# Patient Record
Sex: Male | Born: 1940 | Race: White | Hispanic: No | Marital: Single | Smoking: Never smoker
Health system: Southern US, Community
[De-identification: ages and names within clinical notes are randomized; demographics above are authoritative.]

## PROBLEM LIST (undated history)

## (undated) DIAGNOSIS — J449 Chronic obstructive pulmonary disease, unspecified: Secondary | ICD-10-CM

## (undated) DIAGNOSIS — R0902 Hypoxemia: Secondary | ICD-10-CM

## (undated) DIAGNOSIS — M1990 Unspecified osteoarthritis, unspecified site: Secondary | ICD-10-CM

## (undated) DIAGNOSIS — I10 Essential (primary) hypertension: Secondary | ICD-10-CM

## (undated) DIAGNOSIS — J189 Pneumonia, unspecified organism: Secondary | ICD-10-CM

## (undated) DIAGNOSIS — I2510 Atherosclerotic heart disease of native coronary artery without angina pectoris: Secondary | ICD-10-CM

## (undated) DIAGNOSIS — Z8719 Personal history of other diseases of the digestive system: Secondary | ICD-10-CM

## (undated) DIAGNOSIS — C801 Malignant (primary) neoplasm, unspecified: Secondary | ICD-10-CM

## (undated) DIAGNOSIS — F32A Depression, unspecified: Secondary | ICD-10-CM

## (undated) DIAGNOSIS — R0600 Dyspnea, unspecified: Secondary | ICD-10-CM

## (undated) DIAGNOSIS — E039 Hypothyroidism, unspecified: Secondary | ICD-10-CM

## (undated) DIAGNOSIS — Z87898 Personal history of other specified conditions: Secondary | ICD-10-CM

## (undated) DIAGNOSIS — H9190 Unspecified hearing loss, unspecified ear: Secondary | ICD-10-CM

## (undated) DIAGNOSIS — F329 Major depressive disorder, single episode, unspecified: Secondary | ICD-10-CM

## (undated) DIAGNOSIS — N289 Disorder of kidney and ureter, unspecified: Secondary | ICD-10-CM

## (undated) DIAGNOSIS — E079 Disorder of thyroid, unspecified: Secondary | ICD-10-CM

## (undated) DIAGNOSIS — T7840XA Allergy, unspecified, initial encounter: Secondary | ICD-10-CM

## (undated) DIAGNOSIS — K219 Gastro-esophageal reflux disease without esophagitis: Secondary | ICD-10-CM

## (undated) DIAGNOSIS — R011 Cardiac murmur, unspecified: Secondary | ICD-10-CM

## (undated) DIAGNOSIS — Z87442 Personal history of urinary calculi: Secondary | ICD-10-CM

## (undated) DIAGNOSIS — M109 Gout, unspecified: Secondary | ICD-10-CM

## (undated) HISTORY — PX: HERNIA REPAIR: SHX51

## (undated) HISTORY — PX: UPPER GI ENDOSCOPY: SHX6162

## (undated) HISTORY — PX: CARDIAC CATHETERIZATION: SHX172

## (undated) HISTORY — PX: FRACTURE SURGERY: SHX138

## (undated) HISTORY — DX: Allergy, unspecified, initial encounter: T78.40XA

---
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
## 2003-09-29 ENCOUNTER — Other Ambulatory Visit: Payer: Self-pay

## 2003-09-30 ENCOUNTER — Other Ambulatory Visit: Payer: Self-pay

## 2004-03-13 ENCOUNTER — Emergency Department: Payer: Self-pay | Admitting: Internal Medicine

## 2004-04-16 ENCOUNTER — Emergency Department: Payer: Self-pay | Admitting: Emergency Medicine

## 2004-06-17 ENCOUNTER — Emergency Department: Payer: Self-pay | Admitting: Emergency Medicine

## 2004-07-05 ENCOUNTER — Emergency Department: Payer: Self-pay | Admitting: Unknown Physician Specialty

## 2004-07-05 ENCOUNTER — Other Ambulatory Visit: Payer: Self-pay

## 2004-07-07 ENCOUNTER — Emergency Department: Payer: Self-pay | Admitting: Emergency Medicine

## 2004-08-21 ENCOUNTER — Emergency Department: Payer: Self-pay | Admitting: General Practice

## 2004-10-10 ENCOUNTER — Emergency Department: Payer: Self-pay | Admitting: Unknown Physician Specialty

## 2004-11-04 ENCOUNTER — Emergency Department: Payer: Self-pay | Admitting: Emergency Medicine

## 2005-09-19 ENCOUNTER — Emergency Department: Payer: Self-pay | Admitting: Emergency Medicine

## 2005-10-05 ENCOUNTER — Ambulatory Visit: Payer: Self-pay | Admitting: Internal Medicine

## 2005-11-22 ENCOUNTER — Emergency Department: Payer: Self-pay | Admitting: Emergency Medicine

## 2005-11-22 ENCOUNTER — Other Ambulatory Visit: Payer: Self-pay

## 2005-12-05 ENCOUNTER — Ambulatory Visit: Payer: Self-pay | Admitting: Internal Medicine

## 2005-12-08 ENCOUNTER — Emergency Department: Payer: Self-pay | Admitting: Emergency Medicine

## 2005-12-29 ENCOUNTER — Emergency Department: Payer: Self-pay

## 2006-01-01 ENCOUNTER — Emergency Department: Payer: Self-pay | Admitting: Internal Medicine

## 2006-04-07 ENCOUNTER — Emergency Department: Payer: Self-pay | Admitting: General Practice

## 2006-04-24 ENCOUNTER — Ambulatory Visit: Payer: Self-pay | Admitting: Family Medicine

## 2006-06-05 ENCOUNTER — Other Ambulatory Visit: Payer: Self-pay

## 2006-06-05 ENCOUNTER — Emergency Department: Payer: Self-pay | Admitting: Emergency Medicine

## 2006-06-15 ENCOUNTER — Emergency Department: Payer: Self-pay | Admitting: Emergency Medicine

## 2006-08-07 ENCOUNTER — Emergency Department: Payer: Self-pay

## 2006-08-08 ENCOUNTER — Other Ambulatory Visit: Payer: Self-pay

## 2006-10-23 ENCOUNTER — Emergency Department: Payer: Self-pay | Admitting: Emergency Medicine

## 2006-10-23 ENCOUNTER — Other Ambulatory Visit: Payer: Self-pay

## 2007-04-23 ENCOUNTER — Emergency Department: Payer: Self-pay | Admitting: Emergency Medicine

## 2007-05-18 ENCOUNTER — Emergency Department: Payer: Self-pay | Admitting: Emergency Medicine

## 2007-05-22 ENCOUNTER — Emergency Department: Payer: Self-pay | Admitting: Emergency Medicine

## 2007-05-28 ENCOUNTER — Emergency Department: Payer: Self-pay | Admitting: Unknown Physician Specialty

## 2007-08-09 ENCOUNTER — Emergency Department: Payer: Self-pay | Admitting: Unknown Physician Specialty

## 2008-01-20 ENCOUNTER — Ambulatory Visit: Payer: Self-pay | Admitting: Internal Medicine

## 2008-04-12 ENCOUNTER — Emergency Department: Payer: Self-pay | Admitting: Internal Medicine

## 2009-04-07 ENCOUNTER — Ambulatory Visit: Payer: Self-pay | Admitting: Internal Medicine

## 2009-07-06 ENCOUNTER — Ambulatory Visit: Payer: Self-pay | Admitting: Gastroenterology

## 2009-09-25 ENCOUNTER — Emergency Department: Payer: Self-pay | Admitting: Unknown Physician Specialty

## 2009-10-08 ENCOUNTER — Emergency Department: Payer: Self-pay | Admitting: Emergency Medicine

## 2009-10-11 ENCOUNTER — Emergency Department: Payer: Self-pay | Admitting: Emergency Medicine

## 2010-04-26 ENCOUNTER — Emergency Department: Payer: Self-pay | Admitting: Emergency Medicine

## 2010-05-02 ENCOUNTER — Emergency Department: Payer: Self-pay | Admitting: Emergency Medicine

## 2011-05-16 ENCOUNTER — Emergency Department: Payer: Self-pay

## 2011-11-21 ENCOUNTER — Emergency Department: Payer: Self-pay | Admitting: Emergency Medicine

## 2011-11-21 LAB — COMPREHENSIVE METABOLIC PANEL
Anion Gap: 6 — ABNORMAL LOW (ref 7–16)
Bilirubin,Total: 0.7 mg/dL (ref 0.2–1.0)
Calcium, Total: 9.1 mg/dL (ref 8.5–10.1)
Chloride: 104 mmol/L (ref 98–107)
Creatinine: 1.17 mg/dL (ref 0.60–1.30)
EGFR (African American): >60
EGFR (Non-African Amer.): >60
Glucose: 77 mg/dL (ref 65–99)
Osmolality: 277 (ref 275–301)
SGPT (ALT): 29 U/L (ref 12–78)
Sodium: 139 mmol/L (ref 136–145)

## 2011-11-21 LAB — CBC
HCT: 45.6 % (ref 40.0–52.0)
HGB: 15.8 g/dL (ref 13.0–18.0)
MCHC: 34.5 g/dL (ref 32.0–36.0)
MCV: 96 fL (ref 80–100)
RBC: 4.75 10*6/uL (ref 4.40–5.90)
WBC: 6.1 10*3/uL (ref 3.8–10.6)

## 2012-03-22 ENCOUNTER — Emergency Department: Payer: Self-pay | Admitting: Emergency Medicine

## 2012-03-22 LAB — COMPREHENSIVE METABOLIC PANEL
Alkaline Phosphatase: 75 U/L (ref 50–136)
BUN: 19 mg/dL — ABNORMAL HIGH (ref 7–18)
Chloride: 109 mmol/L — ABNORMAL HIGH (ref 98–107)
Co2: 27 mmol/L (ref 21–32)
Creatinine: 1.18 mg/dL (ref 0.60–1.30)
Glucose: 120 mg/dL — ABNORMAL HIGH (ref 65–99)
Osmolality: 287 (ref 275–301)
SGOT(AST): 22 U/L (ref 15–37)
Sodium: 142 mmol/L (ref 136–145)

## 2012-03-22 LAB — CBC
HGB: 15 g/dL (ref 13.0–18.0)
MCH: 32.2 pg (ref 26.0–34.0)
Platelet: 192 10*3/uL (ref 150–440)
RBC: 4.65 10*6/uL (ref 4.40–5.90)
RDW: 13.1 % (ref 11.5–14.5)

## 2012-03-22 LAB — CK TOTAL AND CKMB (NOT AT ARMC)
CK, Total: 47 U/L (ref 35–232)
CK-MB: <0.5 ng/mL — ABNORMAL LOW (ref 0.5–3.6)

## 2012-03-25 ENCOUNTER — Emergency Department: Payer: Self-pay | Admitting: Urology

## 2012-07-21 DIAGNOSIS — M545 Low back pain, unspecified: Secondary | ICD-10-CM | POA: Insufficient documentation

## 2012-07-23 ENCOUNTER — Emergency Department: Payer: Self-pay

## 2012-09-20 ENCOUNTER — Emergency Department: Payer: Self-pay | Admitting: Emergency Medicine

## 2012-09-20 LAB — BASIC METABOLIC PANEL
BUN: 30 mg/dL — ABNORMAL HIGH (ref 7–18)
Chloride: 104 mmol/L (ref 98–107)
Co2: 32 mmol/L (ref 21–32)
Creatinine: 1.86 mg/dL — ABNORMAL HIGH (ref 0.60–1.30)
EGFR (African American): 42 — ABNORMAL LOW
Glucose: 92 mg/dL (ref 65–99)
Osmolality: 283 (ref 275–301)
Potassium: 4.1 mmol/L (ref 3.5–5.1)

## 2012-09-20 LAB — CBC
HCT: 38.7 % — ABNORMAL LOW (ref 40.0–52.0)
MCHC: 35.8 g/dL (ref 32.0–36.0)
MCV: 95 fL (ref 80–100)
Platelet: 175 10*3/uL (ref 150–440)
RDW: 13.9 % (ref 11.5–14.5)

## 2013-01-29 ENCOUNTER — Emergency Department: Payer: Self-pay | Admitting: Emergency Medicine

## 2013-01-29 LAB — URINALYSIS, COMPLETE
Bacteria: NONE SEEN
Bilirubin,UR: NEGATIVE
Blood: NEGATIVE
Glucose,UR: NEGATIVE mg/dL (ref 0–75)
Leukocyte Esterase: NEGATIVE
RBC,UR: NONE SEEN /HPF (ref 0–5)
Specific Gravity: 1.016 (ref 1.003–1.030)
WBC UR: 1 /HPF (ref 0–5)

## 2013-01-29 LAB — COMPREHENSIVE METABOLIC PANEL
Albumin: 4 g/dL (ref 3.4–5.0)
Alkaline Phosphatase: 77 U/L
Anion Gap: 4 — ABNORMAL LOW (ref 7–16)
Bilirubin,Total: 0.5 mg/dL (ref 0.2–1.0)
Calcium, Total: 9.7 mg/dL (ref 8.5–10.1)
Co2: 32 mmol/L (ref 21–32)
Creatinine: 1.65 mg/dL — ABNORMAL HIGH (ref 0.60–1.30)
EGFR (African American): 48 — ABNORMAL LOW
Glucose: 86 mg/dL (ref 65–99)
Osmolality: 288 (ref 275–301)
Potassium: 4.1 mmol/L (ref 3.5–5.1)
SGOT(AST): 20 U/L (ref 15–37)
SGPT (ALT): 30 U/L (ref 12–78)
Sodium: 142 mmol/L (ref 136–145)
Total Protein: 7.5 g/dL (ref 6.4–8.2)

## 2013-01-29 LAB — CBC WITH DIFFERENTIAL/PLATELET
Basophil %: 0.9 %
Eosinophil %: 1.3 %
HGB: 14.8 g/dL (ref 13.0–18.0)
MCH: 32.5 pg (ref 26.0–34.0)
MCHC: 33.7 g/dL (ref 32.0–36.0)
MCV: 96 fL (ref 80–100)
Monocyte %: 6.6 %
Neutrophil #: 5.3 10*3/uL (ref 1.4–6.5)
Neutrophil %: 66.1 %
WBC: 8 10*3/uL (ref 3.8–10.6)

## 2013-02-23 ENCOUNTER — Emergency Department: Payer: Self-pay | Admitting: Emergency Medicine

## 2013-06-03 ENCOUNTER — Emergency Department (HOSPITAL_COMMUNITY)
Admission: EM | Admit: 2013-06-03 | Discharge: 2013-06-03 | Disposition: A | Discharge status: Home / Self Care | Payer: Medicare HMO | Attending: Emergency Medicine | Admitting: Emergency Medicine

## 2013-06-03 ENCOUNTER — Encounter (HOSPITAL_COMMUNITY): Payer: Self-pay | Admitting: Emergency Medicine

## 2013-06-03 DIAGNOSIS — E079 Disorder of thyroid, unspecified: Secondary | ICD-10-CM | POA: Insufficient documentation

## 2013-06-03 DIAGNOSIS — Z79899 Other long term (current) drug therapy: Secondary | ICD-10-CM | POA: Insufficient documentation

## 2013-06-03 DIAGNOSIS — I10 Essential (primary) hypertension: Secondary | ICD-10-CM | POA: Insufficient documentation

## 2013-06-03 DIAGNOSIS — I2510 Atherosclerotic heart disease of native coronary artery without angina pectoris: Secondary | ICD-10-CM | POA: Insufficient documentation

## 2013-06-03 DIAGNOSIS — K5900 Constipation, unspecified: Secondary | ICD-10-CM | POA: Insufficient documentation

## 2013-06-03 DIAGNOSIS — M109 Gout, unspecified: Secondary | ICD-10-CM | POA: Insufficient documentation

## 2013-06-03 DIAGNOSIS — Z87448 Personal history of other diseases of urinary system: Secondary | ICD-10-CM | POA: Insufficient documentation

## 2013-06-03 HISTORY — DX: Essential (primary) hypertension: I10

## 2013-06-03 HISTORY — DX: Atherosclerotic heart disease of native coronary artery without angina pectoris: I25.10

## 2013-06-03 HISTORY — DX: Disorder of thyroid, unspecified: E07.9

## 2013-06-03 HISTORY — DX: Gout, unspecified: M10.9

## 2013-06-03 HISTORY — DX: Disorder of kidney and ureter, unspecified: N28.9

## 2013-06-03 LAB — URINALYSIS, ROUTINE W REFLEX MICROSCOPIC
BILIRUBIN URINE: NEGATIVE
Glucose, UA: NEGATIVE mg/dL
HGB URINE DIPSTICK: NEGATIVE
Ketones, ur: NEGATIVE mg/dL
Leukocytes, UA: NEGATIVE
NITRITE: NEGATIVE
Protein, ur: NEGATIVE mg/dL
SPECIFIC GRAVITY, URINE: 1.012 (ref 1.005–1.030)
Urobilinogen, UA: 0.2 mg/dL (ref 0.0–1.0)
pH: 7 (ref 5.0–8.0)

## 2013-06-03 LAB — COMPREHENSIVE METABOLIC PANEL
ALBUMIN: 4.2 g/dL (ref 3.5–5.2)
ALT: 23 U/L (ref 0–53)
AST: 26 U/L (ref 0–37)
Alkaline Phosphatase: 54 U/L (ref 39–117)
BUN: 20 mg/dL (ref 6–23)
CO2: 25 mEq/L (ref 19–32)
CREATININE: 1.45 mg/dL — AB (ref 0.50–1.35)
Calcium: 9.8 mg/dL (ref 8.4–10.5)
Chloride: 102 mEq/L (ref 96–112)
GFR calc non Af Amer: 47 mL/min — ABNORMAL LOW (ref >90)
GFR, EST AFRICAN AMERICAN: 54 mL/min — AB (ref >90)
GLUCOSE: 79 mg/dL (ref 70–99)
Potassium: 4.4 mEq/L (ref 3.7–5.3)
Sodium: 141 mEq/L (ref 137–147)
TOTAL PROTEIN: 7.3 g/dL (ref 6.0–8.3)
Total Bilirubin: 0.7 mg/dL (ref 0.3–1.2)

## 2013-06-03 LAB — CBC WITH DIFFERENTIAL/PLATELET
BASOS PCT: 0 % (ref 0–1)
Basophils Absolute: 0 10*3/uL (ref 0.0–0.1)
EOS PCT: 2 % (ref 0–5)
Eosinophils Absolute: 0.1 10*3/uL (ref 0.0–0.7)
HCT: 44.2 % (ref 39.0–52.0)
HEMOGLOBIN: 15 g/dL (ref 13.0–17.0)
LYMPHS ABS: 2.1 10*3/uL (ref 0.7–4.0)
Lymphocytes Relative: 36 % (ref 12–46)
MCH: 32.4 pg (ref 26.0–34.0)
MCHC: 33.9 g/dL (ref 30.0–36.0)
MCV: 95.5 fL (ref 78.0–100.0)
MONOS PCT: 11 % (ref 3–12)
Monocytes Absolute: 0.6 10*3/uL (ref 0.1–1.0)
Neutro Abs: 2.9 10*3/uL (ref 1.7–7.7)
Neutrophils Relative %: 51 % (ref 43–77)
Platelets: 172 10*3/uL (ref 150–400)
RBC: 4.63 MIL/uL (ref 4.22–5.81)
RDW: 13.4 % (ref 11.5–15.5)
WBC: 5.8 10*3/uL (ref 4.0–10.5)

## 2013-06-03 LAB — LIPASE, BLOOD: Lipase: 18 U/L (ref 11–59)

## 2013-06-03 LAB — TROPONIN I

## 2013-06-03 MED ORDER — POLYETHYLENE GLYCOL 3350 17 GM/SCOOP PO POWD: 17.0000 g | Freq: Every day | ORAL | Status: DC

## 2013-06-03 MED ORDER — DOCUSATE SODIUM 100 MG PO CAPS: 100.0000 mg | ORAL_CAPSULE | Freq: Two times a day (BID) | ORAL | Status: DC

## 2013-06-03 NOTE — ED Notes (Signed)
The pt has been constipated for 6 months .  He was at his doctors office today and ems was called for the pts bradycardia.  ems reports that the pt hr was in the 50s sinus brady and the doctor has been attempting to get his bp under control.  Everything makes his heart decrease.  The pt is upset on arrival that he had to leave his truck back at the office

## 2013-06-03 NOTE — ED Notes (Signed)
The pts last bm was at 0200am today.  hehas pain in his shoulders and neck .  He has arthritis

## 2013-06-03 NOTE — Discharge Instructions (Signed)
Constipation, Adult Constipation is when a person has fewer than 3 bowel movements a week; has difficulty having a bowel movement; or has stools that are dry, hard, or larger than normal. As people grow older, constipation is more common. If you try to fix constipation with medicines that make you have a bowel movement (laxatives), the problem may get worse. Long-term laxative use may cause the muscles of the colon to become weak. A low-fiber diet, not taking in enough fluids, and taking certain medicines may make constipation worse. CAUSES   Certain medicines, such as antidepressants, pain medicine, iron supplements, antacids, and water pills.   Certain diseases, such as diabetes, irritable bowel syndrome (IBS), thyroid disease, or depression.   Not drinking enough water.   Not eating enough fiber-rich foods.   Stress or travel.  Lack of physical activity or exercise.  Not going to the restroom when there is the urge to have a bowel movement.  Ignoring the urge to have a bowel movement.  Using laxatives too much. SYMPTOMS   Having fewer than 3 bowel movements a week.   Straining to have a bowel movement.   Having hard, dry, or larger than normal stools.   Feeling full or bloated.   Pain in the lower abdomen.  Not feeling relief after having a bowel movement. DIAGNOSIS  Your caregiver will take a medical history and perform a physical exam. Further testing may be done for severe constipation. Some tests may include:   A barium enema X-ray to examine your rectum, colon, and sometimes, your small intestine.  A sigmoidoscopy to examine your lower colon.  A colonoscopy to examine your entire colon. TREATMENT  Treatment will depend on the severity of your constipation and what is causing it. Some dietary treatments include drinking more fluids and eating more fiber-rich foods. Lifestyle treatments may include regular exercise. If these diet and lifestyle recommendations  do not help, your caregiver may recommend taking over-the-counter laxative medicines to help you have bowel movements. Prescription medicines may be prescribed if over-the-counter medicines do not work.  HOME CARE INSTRUCTIONS   Increase dietary fiber in your diet, such as fruits, vegetables, whole grains, and beans. Limit high-fat and processed sugars in your diet, such as [place] fries, hamburgers, cookies, candies, and soda.   A fiber supplement may be added to your diet if you cannot get enough fiber from foods.   Drink enough fluids to keep your urine clear or pale yellow.   Exercise regularly or as directed by your caregiver.   Go to the restroom when you have the urge to go. Do not hold it.  Only take medicines as directed by your caregiver. Do not take other medicines for constipation without talking to your caregiver first. [place] IF:   You have bright red blood in your stool.   Your constipation lasts for more than 4 days or gets worse.   You have abdominal or rectal pain.   You have thin, pencil-like stools.  You have unexplained weight loss. MAKE SURE YOU:   Understand these instructions.  Will watch your condition.  Will get help right away if you are not doing well or get worse. Document Released: 10/28/2003 Document Revised: 04/23/2011 Document Reviewed: 11/10/2012 [facility] Patient Information 2014 [place], [place]. Hypertension As your heart beats, it forces blood through your arteries. This force is your blood pressure. If the pressure is too high, it is called hypertension (HTN) or high blood pressure. HTN is dangerous because  you may have it and not know it. High blood pressure may mean that your heart has to work harder to pump blood. Your arteries may be narrow or stiff. The extra work puts you at risk for heart disease, stroke, and other problems.  Blood pressure consists of two numbers, a higher number over a lower, 110/72, for  example. It is stated as "110 over 72." The ideal is below 120 for the top number (systolic) and under 80 for the bottom (diastolic). Write down your blood pressure today. You should pay close attention to your blood pressure if you have certain conditions such as:  Heart failure.  Prior heart attack.  Diabetes  Chronic kidney disease.  Prior stroke.  Multiple risk factors for heart disease. To see if you have HTN, your blood pressure should be measured while you are seated with your arm held at the level of the heart. It should be measured at least twice. A one-time elevated blood pressure reading (especially in the Emergency Department) does not mean that you need treatment. There may be conditions in which the blood pressure is different between your right and left arms. It is important to see your caregiver soon for a recheck. Most people have essential hypertension which means that there is not a specific cause. This type of high blood pressure may be lowered by changing lifestyle factors such as:  Stress.  Smoking.  Lack of exercise.  Excessive weight.  Drug/tobacco/alcohol use.  Eating less salt. Most people do not have symptoms from high blood pressure until it has caused damage to the body. Effective treatment can often prevent, delay or reduce that damage. TREATMENT  When a cause has been identified, treatment for high blood pressure is directed at the cause. There are a large number of medications to treat HTN. These fall into several categories, and your caregiver will help you select the medicines that are best for you. Medications may have side effects. You should review side effects with your caregiver. If your blood pressure stays high after you have made lifestyle changes or started on medicines,   Your medication(s) may need to be changed.  Other problems may need to be addressed.  Be certain you understand your prescriptions, and know how and when to take your  medicine.  Be sure to follow up with your caregiver within the time frame advised (usually within two weeks) to have your blood pressure rechecked and to review your medications.  If you are taking more than one medicine to lower your blood pressure, make sure you know how and at what times they should be taken. Taking two medicines at the same time can result in blood pressure that is too low. SEEK IMMEDIATE MEDICAL CARE IF:  You develop a severe headache, blurred or changing vision, or confusion.  You have unusual weakness or numbness, or a faint feeling.  You have severe chest or abdominal pain, vomiting, or breathing problems. MAKE SURE YOU:   Understand these instructions.  Will watch your condition.  Will get help right away if you are not doing well or get worse. Document Released: 01/29/2005 Document Revised: 04/23/2011 Document Reviewed: 09/19/2007 [facility] Patient Information 2014 [place].

## 2013-06-03 NOTE — ED Notes (Signed)
The pt is very hard of hearing.  Talking on the phone to his family.  They are coming by to get the keps for his truck

## 2013-06-03 NOTE — ED Provider Notes (Signed)
CSN: [id number]     Arrival date & time 06/03/13  1637 History   First MD Initiated Contact with Patient 06/03/13 1730     Chief Complaint  Patient presents with  . Hypertension     (Consider location/radiation/quality/duration/timing/severity/associated sxs/prior Treatment) Patient is a 73 y.o. male presenting with hypertension.  Hypertension   Pt with history of difficult to control HTN also has had constipation off and on for several months he relates to some of the medications he is taking. States he went to his PCP today for evaluation of the constipation and was found to have elevated BP and low HR. He was advised to come to the ED for evaluation. Denies any CP, SOB, leg swelling. States he had a hard stool around 2am last night. May have had some rust colored stool at that time.   Past Medical History  Diagnosis Date  . Hypertension   . Gout   . Thyroid disease   . Coronary artery disease   . Renal disorder    History reviewed. No pertinent past surgical history. No family history on file. History  Substance Use Topics  . Smoking status: Never Smoker   . Smokeless tobacco: Not on file  . Alcohol Use: No    Review of Systems  All other systems reviewed and are negative except as noted in HPI.    Allergies  Codeine; Morphine and related; Novocain; Percocet; and Sulfa antibiotics  Home Medications   Prior to Admission medications   Medication Sig Start Date End Date Taking? Authorizing Provider  allopurinol (ZYLOPRIM) 300 MG tablet Take 300 mg by mouth daily.   Yes Historical Provider, MD  Cholecalciferol (VITAMIN D3) 5000 UNITS TABS Take 5,000 Units by mouth daily.   Yes Historical Provider, MD  cloNIDine (CATAPRES) 0.2 MG tablet Take 0.1 mg by mouth 2 (two) times daily.   Yes Historical Provider, MD  ezetimibe (ZETIA) 10 MG tablet Take 10 mg by mouth daily.   Yes Historical Provider, MD  levothyroxine (SYNTHROID, LEVOTHROID) 112 MCG tablet Take 112 mcg by mouth  daily before breakfast.   Yes Historical Provider, MD  losartan (COZAAR) 100 MG tablet Take 100 mg by mouth daily.   Yes Historical Provider, MD  metoprolol (LOPRESSOR) 100 MG tablet Take 100 mg by mouth daily.   Yes Historical Provider, MD  pantoprazole (PROTONIX) 40 MG tablet Take 40 mg by mouth daily.   Yes Historical Provider, MD   BP 193/82  Pulse 53  Temp(Src) 97.6 F (36.4 C) (Oral)  Resp 16  SpO2 97% Physical Exam  Nursing note and vitals reviewed. Constitutional: He is oriented to person, place, and time. He appears well-developed and well-nourished.  HENT:  Head: Normocephalic and atraumatic.  Eyes: EOM are normal. Pupils are equal, round, and reactive to light.  Neck: Normal range of motion. Neck supple.  Cardiovascular: Normal rate, normal heart sounds and intact distal pulses.   Pulmonary/Chest: Effort normal and breath sounds normal.  Abdominal: Bowel sounds are normal. He exhibits no distension. There is no tenderness.  Genitourinary: Rectum normal. Guaiac negative stool.  Musculoskeletal: Normal range of motion. He exhibits no edema and no tenderness.  Neurological: He is alert and oriented to person, place, and time. He has normal strength. No cranial nerve deficit or sensory deficit.  Skin: Skin is warm and dry. No rash noted.  Psychiatric: He has a normal mood and affect.    ED Course  Procedures (including critical care time) Labs Review Labs Reviewed  COMPREHENSIVE METABOLIC PANEL - Abnormal; Notable for the following:    Creatinine, Ser 1.45 (*)    GFR calc non Af Amer 47 (*)    GFR calc Af Amer 54 (*)    All other components within normal limits  CBC WITH DIFFERENTIAL  LIPASE, BLOOD  TROPONIN I  URINALYSIS, ROUTINE W REFLEX MICROSCOPIC    Imaging Review No results found.   EKG Interpretation   Date/Time:  Wednesday June 03 2013 16:44:33 EDT Ventricular Rate:  53 PR Interval:  158 QRS Duration: 97 QT Interval:  425 QTC Calculation: 399 R  Axis:   4 Text Interpretation:  Sinus rhythm Probable anteroseptal infarct, recent  Minimal ST elevation, inferior leads Lateral leads are also involved  Baseline wander in lead(s) II III aVF V2 V3 No old tracing to compare  Confirmed by [facility]  MD, Juanda Crumble [phone]) on 06/03/2013 5:32:18 PM      MDM   Final diagnoses:  None    Labs reviewed and unremarkable. Positive hemoccult is a lab entry error. It was negative. No evidence of end organ damage. Pt missed his middle of the day dose of Clonidine today. No indication for admission. Advised to call PCP tomorrow for long term management of his BP. Will give miralax, colace for constipation.    Charles B. Karle Starch, MD 06/03/13 1924

## 2013-06-03 NOTE — ED Notes (Signed)
The pts bp is slowly decreasing.  Pt remains alert no distress

## 2013-06-04 ENCOUNTER — Encounter: Payer: Self-pay | Admitting: Cardiovascular Disease

## 2013-06-04 ENCOUNTER — Other Ambulatory Visit: Payer: Self-pay | Admitting: Family Medicine

## 2013-06-04 ENCOUNTER — Ambulatory Visit
Admission: RE | Admit: 2013-06-04 | Discharge: 2013-06-04 | Disposition: A | Discharge status: Home / Self Care | Payer: Medicare Other | Source: Ambulatory Visit | Attending: Family Medicine | Admitting: Family Medicine

## 2013-06-04 DIAGNOSIS — N289 Disorder of kidney and ureter, unspecified: Secondary | ICD-10-CM | POA: Insufficient documentation

## 2013-06-04 DIAGNOSIS — I129 Hypertensive chronic kidney disease with stage 1 through stage 4 chronic kidney disease, or unspecified chronic kidney disease: Secondary | ICD-10-CM | POA: Insufficient documentation

## 2013-06-04 DIAGNOSIS — K56609 Unspecified intestinal obstruction, unspecified as to partial versus complete obstruction: Secondary | ICD-10-CM

## 2013-06-04 DIAGNOSIS — E079 Disorder of thyroid, unspecified: Secondary | ICD-10-CM | POA: Insufficient documentation

## 2013-06-04 DIAGNOSIS — I2510 Atherosclerotic heart disease of native coronary artery without angina pectoris: Secondary | ICD-10-CM | POA: Insufficient documentation

## 2013-06-04 DIAGNOSIS — I10 Essential (primary) hypertension: Secondary | ICD-10-CM | POA: Insufficient documentation

## 2013-06-04 DIAGNOSIS — M109 Gout, unspecified: Secondary | ICD-10-CM | POA: Insufficient documentation

## 2013-06-04 LAB — POC OCCULT BLOOD, ED: FECAL OCCULT BLD: NEGATIVE

## 2013-06-05 ENCOUNTER — Encounter: Payer: Self-pay | Admitting: Cardiovascular Disease

## 2013-06-05 ENCOUNTER — Ambulatory Visit (INDEPENDENT_AMBULATORY_CARE_PROVIDER_SITE_OTHER): Payer: Commercial Managed Care - HMO | Admitting: Cardiovascular Disease

## 2013-06-05 VITALS — BP 150/78 | HR 60 | Ht 67.0 in | Wt 212.0 lb

## 2013-06-05 DIAGNOSIS — R001 Bradycardia, unspecified: Secondary | ICD-10-CM | POA: Insufficient documentation

## 2013-06-05 DIAGNOSIS — R5381 Other malaise: Secondary | ICD-10-CM

## 2013-06-05 DIAGNOSIS — I517 Cardiomegaly: Secondary | ICD-10-CM

## 2013-06-05 DIAGNOSIS — I498 Other specified cardiac arrhythmias: Secondary | ICD-10-CM

## 2013-06-05 DIAGNOSIS — R5383 Other fatigue: Secondary | ICD-10-CM | POA: Insufficient documentation

## 2013-06-05 DIAGNOSIS — I10 Essential (primary) hypertension: Secondary | ICD-10-CM

## 2013-06-05 MED ORDER — EZETIMIBE 10 MG PO TABS: 10.0000 mg | ORAL_TABLET | Freq: Every day | ORAL | Status: DC

## 2013-06-05 NOTE — Progress Notes (Signed)
Patient ID: Bobby Lane, male   DOB: 1940/08/27, 73 y.o.   MRN: [id number]   73 yo patient of Dr Ernie Hew.  Seen in ER recently for HTN. Had been constipated  Primary changing meds and BP high with bradycardia that was iatrogenic.  Since then beta blocker has been decreased and clonidine increased to tid and improved. BP Rx for over 10 years Cr around 1.5.  Usually compliant with meds but not always due to being on fixed income Denies ETOH.  Has never had syncope or palpitations.  Constipation had improved and he refused KUB.  No history of CAD.  Has baldder issue where it was sewn to abdominal wall during hernia repair and has some frequency.  Fatigue worse with adjustments in BP meds especially clonidine which causes dry mouth    ROS: Denies fever, malais, weight loss, blurry vision, decreased visual acuity, cough, sputum, SOB, hemoptysis, pleuritic pain, palpitaitons, heartburn, abdominal pain, melena, lower extremity edema, claudication, or rash.  All other systems reviewed and negative   General: Affect appropriate Desheveled white male HEENT: poor dentition  Neck supple with no adenopathy JVP normal no bruits no thyromegaly Lungs clear with no wheezing and good diaphragmatic motion Heart:  S1/S2 no murmur,rub, gallop or click PMI normal Abdomen: benighn, BS positve, no tenderness, no AAA no bruit.  No HSM or HJR Distal pulses intact with no bruits No edema Neuro non-focal Skin warm and dry No muscular weakness  Medications Current Outpatient Prescriptions  Medication Sig Dispense Refill  . allopurinol (ZYLOPRIM) 300 MG tablet Take 300 mg by mouth daily.      . Cholecalciferol (VITAMIN D3) 5000 UNITS TABS Take 5,000 Units by mouth daily.      . cloNIDine (CATAPRES) 0.2 MG tablet Take 0.1 mg by mouth 2 (two) times daily.      Marland Kitchen docusate sodium (COLACE) 100 MG capsule Take 1 capsule (100 mg total) by mouth every 12 (twelve) hours.  60 capsule  0  . ezetimibe (ZETIA) 10 MG tablet Take  10 mg by mouth daily.      Marland Kitchen levothyroxine (SYNTHROID, LEVOTHROID) 112 MCG tablet Take 112 mcg by mouth daily before breakfast.      . losartan (COZAAR) 100 MG tablet Take 100 mg by mouth daily.      . metoprolol (LOPRESSOR) 100 MG tablet Take 100 mg by mouth daily. Taking 50mg       . pantoprazole (PROTONIX) 40 MG tablet Take 40 mg by mouth daily.       No current facility-administered medications for this visit.    Allergies Codeine; Morphine and related; Novocain; Percocet; and Sulfa antibiotics  Family History: No family history on file.  Social History: History   Social History  . Marital Status: Widowed    Spouse Name: N/A    Number of Children: N/A  . Years of Education: N/A   Occupational History  . Not on file.   Social History Main Topics  . Smoking status: Never Smoker   . Smokeless tobacco: Not on file  . Alcohol Use: No  . Drug Use: Not on file  . Sexual Activity: Not on file   Other Topics Concern  . Not on file   Social History Narrative  . No narrative on file    Electrocardiogram:  NSR 53 LVH  Assessment and Plan

## 2013-06-05 NOTE — Patient Instructions (Signed)
Your physician recommends that you schedule a follow-up appointment in:  AS NEEDED   Your physician recommends that you continue on your current medications as directed. Please refer to the Current Medication list given to you today. Your physician has requested that you have an echocardiogram. Echocardiography is a painless test that uses sound waves to create images of your heart. It provides your doctor with information about the size and shape of your heart and how well your heart's chambers and valves are working. This procedure takes approximately one hour. There are no restrictions for this procedure.   Your physician has requested that you have a renal artery duplex. During this test, an ultrasound is used to evaluate blood flow to the kidneys. Allow one hour for this exam. Do not eat after midnight the day before and avoid carbonated beverages. Take your medications as you usually do.

## 2013-06-05 NOTE — Assessment & Plan Note (Signed)
Should be handled by primary Will order renal duplex to make sure no RAS given mild elevation in Cr Suspect hydralazine by be a better tolerated drug for him in future.

## 2013-06-05 NOTE — Assessment & Plan Note (Signed)
Iatrogenic  Absolutely no indication for pacer  Avoid excess doses of beta blocker to Rx HTN.  ECG with normal intervals

## 2013-06-05 NOTE — Assessment & Plan Note (Signed)
Doubt cardiac etiology May be from high dose of clonidine  Echo to assess LV function

## 2013-06-23 ENCOUNTER — Ambulatory Visit (HOSPITAL_COMMUNITY): Payer: Medicare HMO | Attending: Cardiology | Admitting: Cardiology

## 2013-06-23 ENCOUNTER — Ambulatory Visit (HOSPITAL_BASED_OUTPATIENT_CLINIC_OR_DEPARTMENT_OTHER): Payer: Medicare HMO | Admitting: Radiology

## 2013-06-23 DIAGNOSIS — I10 Essential (primary) hypertension: Secondary | ICD-10-CM | POA: Insufficient documentation

## 2013-06-23 DIAGNOSIS — I517 Cardiomegaly: Secondary | ICD-10-CM

## 2013-06-23 NOTE — Progress Notes (Signed)
Renal artery duplex completed

## 2013-06-23 NOTE — Progress Notes (Signed)
Echocardiogram performed.  

## 2013-07-11 ENCOUNTER — Emergency Department: Payer: Self-pay | Admitting: Emergency Medicine

## 2013-08-21 ENCOUNTER — Emergency Department: Payer: Self-pay | Admitting: Emergency Medicine

## 2013-09-02 ENCOUNTER — Emergency Department: Payer: Self-pay | Admitting: Emergency Medicine

## 2013-09-02 LAB — URINALYSIS, COMPLETE
Bacteria: NONE SEEN
Bilirubin,UR: NEGATIVE
Blood: NEGATIVE
Glucose,UR: NEGATIVE mg/dL (ref 0–75)
Ketone: NEGATIVE
Leukocyte Esterase: NEGATIVE
Nitrite: NEGATIVE
PH: 5 (ref 4.5–8.0)
PROTEIN: NEGATIVE
Specific Gravity: 1.013 (ref 1.003–1.030)
Squamous Epithelial: <1
WBC UR: NONE SEEN /HPF (ref 0–5)

## 2013-09-02 LAB — COMPREHENSIVE METABOLIC PANEL
ALK PHOS: 61 U/L
ANION GAP: 7 (ref 7–16)
Albumin: 4 g/dL (ref 3.4–5.0)
BUN: 30 mg/dL — AB (ref 7–18)
Bilirubin,Total: 0.4 mg/dL (ref 0.2–1.0)
CHLORIDE: 105 mmol/L (ref 98–107)
CO2: 29 mmol/L (ref 21–32)
Calcium, Total: 9.9 mg/dL (ref 8.5–10.1)
Creatinine: 1.53 mg/dL — ABNORMAL HIGH (ref 0.60–1.30)
EGFR (Non-African Amer.): 45 — ABNORMAL LOW
GFR CALC AF AMER: 52 — AB
Glucose: 89 mg/dL (ref 65–99)
Osmolality: 287 (ref 275–301)
POTASSIUM: 4.5 mmol/L (ref 3.5–5.1)
SGOT(AST): 31 U/L (ref 15–37)
SGPT (ALT): 46 U/L
SODIUM: 141 mmol/L (ref 136–145)
TOTAL PROTEIN: 7.4 g/dL (ref 6.4–8.2)

## 2013-09-02 LAB — CBC WITH DIFFERENTIAL/PLATELET
Basophil #: 0.1 10*3/uL (ref 0.0–0.1)
Basophil %: 0.7 %
EOS ABS: 0.1 10*3/uL (ref 0.0–0.7)
Eosinophil %: 1.7 %
HCT: 45 % (ref 40.0–52.0)
HGB: 15 g/dL (ref 13.0–18.0)
LYMPHS ABS: 2.3 10*3/uL (ref 1.0–3.6)
Lymphocyte %: 31 %
MCH: 33.2 pg (ref 26.0–34.0)
MCHC: 33.3 g/dL (ref 32.0–36.0)
MCV: 100 fL (ref 80–100)
Monocyte #: 0.6 x10 3/mm (ref 0.2–1.0)
Monocyte %: 8.4 %
Neutrophil #: 4.3 10*3/uL (ref 1.4–6.5)
Neutrophil %: 58.2 %
Platelet: 204 10*3/uL (ref 150–440)
RBC: 4.52 10*6/uL (ref 4.40–5.90)
RDW: 13.9 % (ref 11.5–14.5)
WBC: 7.3 10*3/uL (ref 3.8–10.6)

## 2013-10-09 ENCOUNTER — Ambulatory Visit
Admission: RE | Admit: 2013-10-09 | Discharge: 2013-10-09 | Disposition: A | Discharge status: Home / Self Care | Payer: Commercial Managed Care - HMO | Source: Ambulatory Visit | Attending: Family Medicine | Admitting: Family Medicine

## 2013-10-09 ENCOUNTER — Other Ambulatory Visit: Payer: Self-pay | Admitting: Family Medicine

## 2013-10-09 DIAGNOSIS — M25551 Pain in right hip: Secondary | ICD-10-CM

## 2014-04-23 ENCOUNTER — Other Ambulatory Visit: Payer: Self-pay | Admitting: Family Medicine

## 2014-04-23 ENCOUNTER — Ambulatory Visit
Admission: RE | Admit: 2014-04-23 | Discharge: 2014-04-23 | Disposition: A | Discharge status: Home / Self Care | Payer: Commercial Managed Care - HMO | Source: Ambulatory Visit | Attending: Family Medicine | Admitting: Family Medicine

## 2014-04-23 DIAGNOSIS — R0602 Shortness of breath: Secondary | ICD-10-CM

## 2014-05-26 DIAGNOSIS — I10 Essential (primary) hypertension: Secondary | ICD-10-CM | POA: Diagnosis not present

## 2014-05-26 DIAGNOSIS — H6123 Impacted cerumen, bilateral: Secondary | ICD-10-CM | POA: Diagnosis not present

## 2014-05-26 DIAGNOSIS — R252 Cramp and spasm: Secondary | ICD-10-CM | POA: Diagnosis not present

## 2014-05-26 DIAGNOSIS — J449 Chronic obstructive pulmonary disease, unspecified: Secondary | ICD-10-CM | POA: Diagnosis not present

## 2014-06-03 DIAGNOSIS — E039 Hypothyroidism, unspecified: Secondary | ICD-10-CM | POA: Diagnosis not present

## 2014-06-03 DIAGNOSIS — I499 Cardiac arrhythmia, unspecified: Secondary | ICD-10-CM | POA: Diagnosis not present

## 2014-06-03 DIAGNOSIS — I10 Essential (primary) hypertension: Secondary | ICD-10-CM | POA: Diagnosis not present

## 2014-06-03 DIAGNOSIS — J449 Chronic obstructive pulmonary disease, unspecified: Secondary | ICD-10-CM | POA: Diagnosis not present

## 2014-07-27 ENCOUNTER — Encounter: Payer: Self-pay | Admitting: *Deleted

## 2014-08-05 ENCOUNTER — Ambulatory Visit: Payer: Self-pay | Admitting: General Surgery

## 2014-08-05 DIAGNOSIS — Z79899 Other long term (current) drug therapy: Secondary | ICD-10-CM | POA: Insufficient documentation

## 2014-08-05 DIAGNOSIS — A6920 Lyme disease, unspecified: Secondary | ICD-10-CM | POA: Insufficient documentation

## 2014-08-05 DIAGNOSIS — I10 Essential (primary) hypertension: Secondary | ICD-10-CM | POA: Diagnosis not present

## 2014-08-05 DIAGNOSIS — R21 Rash and other nonspecific skin eruption: Secondary | ICD-10-CM | POA: Diagnosis present

## 2014-08-06 ENCOUNTER — Emergency Department
Admission: EM | Admit: 2014-08-06 | Discharge: 2014-08-06 | Disposition: A | Discharge status: Home / Self Care | Payer: Medicare HMO | Attending: Emergency Medicine | Admitting: Emergency Medicine

## 2014-08-06 ENCOUNTER — Encounter: Payer: Self-pay | Admitting: *Deleted

## 2014-08-06 DIAGNOSIS — A6920 Lyme disease, unspecified: Secondary | ICD-10-CM

## 2014-08-06 MED ORDER — PANTOPRAZOLE SODIUM 40 MG PO TBEC
40.0000 mg | DELAYED_RELEASE_TABLET | Freq: Every day | ORAL | Status: DC
  Administered 2014-08-06: 40 mg via ORAL

## 2014-08-06 MED ORDER — DOXYCYCLINE HYCLATE 100 MG PO TABS
100.0000 mg | ORAL_TABLET | Freq: Once | ORAL | Status: AC
  Administered 2014-08-06: 100 mg via ORAL

## 2014-08-06 MED ORDER — DOXYCYCLINE HYCLATE 100 MG PO TABS: 100.0000 mg | ORAL_TABLET | Freq: Two times a day (BID) | ORAL | Status: DC

## 2014-08-06 MED ORDER — PANTOPRAZOLE SODIUM 40 MG PO TBEC
DELAYED_RELEASE_TABLET | ORAL | Status: AC
  Administered 2014-08-06: 40 mg via ORAL
  Filled 2014-08-06: qty 1

## 2014-08-06 MED ORDER — OMEPRAZOLE 40 MG PO CPDR: 40.0000 mg | DELAYED_RELEASE_CAPSULE | Freq: Every day | ORAL | Status: DC

## 2014-08-06 MED ORDER — DOXYCYCLINE HYCLATE 100 MG PO TABS
ORAL_TABLET | ORAL | Status: AC
  Administered 2014-08-06: 100 mg via ORAL
  Filled 2014-08-06: qty 1

## 2014-08-06 NOTE — Discharge Instructions (Signed)
Lyme Disease You may have been bitten by a tick and are to watch for the development of Lyme Disease. Lyme Disease is an infection that is caused by a bacteria The bacteria causing this disease is named Borreilia burgdorferi. If a tick is infected with this bacteria and then bites you, then Lyme Disease may occur. These ticks are carried by deer and rodents such as rabbits and mice and infest grassy as well as forested areas. Fortunately most tick bites do not cause Lyme Disease.  Lyme Disease is easier to prevent than to treat. First, covering your legs with clothing when walking in areas where ticks are possibly abundant will prevent their attachment because ticks tend to stay within inches of the ground. Second, using insecticides containing DEET can be applied on skin or clothing. Last, because it takes about 12 to 24 hours for the tick to transmit the disease after attachment to the human host, you should inspect your body for ticks twice a day when you are in areas where Lyme Disease is common. You must look thoroughly when searching for ticks. The Ixodes tick that carries Lyme Disease is very small. It is around the size of a sesame seed (picture of tick is not actual size). Removal is best done by grasping the tick by the head and pulling it out. Do not to squeeze the body of the tick. This could inject the infecting bacteria into the bite site. Wash the area of the bite with an antiseptic solution after removal.  Lyme Disease is a disease that may affect many body systems. Because of the small size of the biting tick, most people do not notice being bitten. The first sign of an infection is usually a round red rash that extends out from the center of the tick bite. The center of the lesion may be blood colored (hemorrhagic) or have tiny blisters (vesicular). Most lesions have bright red outer borders and partial central clearing. This rash may extend out many inches in diameter, and multiple lesions may  be present. Other symptoms such as fatigue, headaches, chills and fever, general achiness and swelling of lymph glands may also occur. If this first stage of the disease is left untreated, these symptoms may gradually resolve by themselves, or progressive symptoms may occur because of spread of infection to other areas of the body.  Follow up with your caregiver to have testing and treatment if you have a tick bite and you develop any of the above complaints. Your caregiver may recommend preventative (prophylactic) medications which kill bacteria (antibiotics). Once a diagnosis of Lyme Disease is made, antibiotic treatment is highly likely to cure the disease. Effective treatment of late stage Lyme Disease may require longer courses of antibiotic therapy.  MAKE SURE YOU:   Understand these instructions.  Will watch your condition.  Will get help right away if you are not doing well or get worse. Document Released: 05/07/2000 Document Revised: 04/23/2011 Document Reviewed: 07/09/2008 [employer] Patient Information 2015 [employer]. This information is not intended to replace advice given to you by your health care provider. Make sure you discuss any questions you have with your health care provider.  

## 2014-08-06 NOTE — ED Provider Notes (Signed)
[facility] Emergency Department Provider Note  ____________________________________________  Time seen: 1:45 AM  I have reviewed the triage vital signs and the nursing notes.   HISTORY  Chief Complaint Tick Removal     HPI Bobby Lane is a 74 y.o. male presents with history of removing a tick from the top of his left foot day prior. Patient admits to area of redness noted at the site of tick removal, in addition patient admits to bilateral looks really cramping today.    Past Medical History  Diagnosis Date  . Hypertension   . Gout   . Thyroid disease   . Coronary artery disease   . Renal disorder     Patient Active Problem List   Diagnosis Date Noted  . Fatigue 06/05/2013  . Bradycardia 06/05/2013  . Hypertension   . Gout   . Thyroid disease   . Coronary artery disease   . Renal disorder     No past surgical history on file.  Current Outpatient Rx  Name  Route  Sig  Dispense  Refill  . allopurinol (ZYLOPRIM) 300 MG tablet   Oral   Take 300 mg by mouth daily.         . Cholecalciferol (VITAMIN D3) 5000 UNITS TABS   Oral   Take 5,000 Units by mouth daily.         . cloNIDine (CATAPRES) 0.2 MG tablet   Oral   Take 0.1 mg by mouth 2 (two) times daily.         Marland Kitchen docusate sodium (COLACE) 100 MG capsule   Oral   Take 1 capsule (100 mg total) by mouth every 12 (twelve) hours.   60 capsule   0   . ezetimibe (ZETIA) 10 MG tablet   Oral   Take 1 tablet (10 mg total) by mouth daily.   30 tablet   11   . levothyroxine (SYNTHROID, LEVOTHROID) 112 MCG tablet   Oral   Take 112 mcg by mouth daily before breakfast.         . losartan (COZAAR) 100 MG tablet   Oral   Take 100 mg by mouth daily.         . metoprolol (LOPRESSOR) 100 MG tablet   Oral   Take 100 mg by mouth daily. Taking 50mg          . pantoprazole (PROTONIX) 40 MG tablet   Oral   Take 40 mg by mouth daily.           Allergies Codeine; Morphine  and related; Novocain; Percocet; and Sulfa antibiotics  No family history on file.  Social History History  Substance Use Topics  . Smoking status: Never Smoker   . Smokeless tobacco: Not on file  . Alcohol Use: No    Review of Systems  Constitutional: Negative for fever. Eyes: Negative for visual changes. ENT: Negative for sore throat. Cardiovascular: Negative for chest pain. Respiratory: Negative for shortness of breath. Gastrointestinal: Negative for abdominal pain, vomiting and diarrhea. Genitourinary: Negative for dysuria. Musculoskeletal: Negative for back pain. Skin: Positive for rash to dorsal aspect left foot Neurological: Negative for headaches, focal weakness or numbness.  10-point ROS otherwise negative.  ____________________________________________   PHYSICAL EXAM:  VITAL SIGNS: ED Triage Vitals  Enc Vitals Group     BP 08/06/14 0017 128/73 mmHg     Pulse Rate 08/06/14 0017 81     Resp 08/06/14 0017 20     Temp 08/06/14 0017  98.7 F (37.1 C)     Temp Source 08/06/14 0017 Oral     SpO2 08/06/14 0017 95 %     Weight 08/06/14 0017 223 lb (101.152 kg)     Height 08/06/14 0017 5\' 7"  (1.702 m)     Head Cir --      Peak Flow --      Pain Score --      Pain Loc --      Pain Edu? --      Excl. in [place]? --      Constitutional: Alert and oriented. Well appearing and in no distress. Eyes: Conjunctivae are normal. PERRL. Normal extraocular movements. ENT   Head: Normocephalic and atraumatic.   Nose: No congestion/rhinnorhea.   Mouth/Throat: Mucous membranes are moist.   Neck: No stridor. Hematological/Lymphatic/Immunilogical: No cervical lymphadenopathy. Cardiovascular: Normal rate, regular rhythm. Normal and symmetric distal pulses are present in all extremities. No murmurs, rubs, or gallops. Respiratory: Normal respiratory effort without tachypnea nor retractions. Breath sounds are clear and equal bilaterally. No  wheezes/rales/rhonchi. Gastrointestinal: Soft and nontender. No distention. There is no CVA tenderness. Genitourinary: deferred Musculoskeletal: Nontender with normal range of motion in all extremities. No joint effusions.  No lower extremity tenderness nor edema. Neurologic:  Normal speech and language. No gross focal neurologic deficits are appreciated. Speech is normal.  Skin:  1 x 1 cm area of erythematous rash noted to the dorsal aspect of the left foot with central excoriated area. Psychiatric: Mood and affect are normal. Speech and behavior are normal. Patient exhibits appropriate insight and judgment.  ____________________________________________          INITIAL IMPRESSION / ASSESSMENT AND PLAN / ED COURSE  Pertinent labs & imaging results that were available during my care of the patient were reviewed by me and considered in my medical decision making (see chart for details).  Patient also requested medication for reflux. Patient received omeprazole 20 mg and doxycycline 100 mg we'll discharge patient with outpatient follow-up with Dr. Chancy Milroy  ____________________________________________   FINAL CLINICAL IMPRESSION(S) / ED DIAGNOSES  Final diagnoses:  Lyme disease      Gregor Hams, MD 08/06/14 [phone]

## 2014-08-06 NOTE — ED Notes (Signed)
Pt. States he was bit on top of lt. Foot by a tick Wednesday evening.  Pt. was able to remove tick from spot.  Pt. States swelling started Thursday evening.  Pt. Also states digestive problems in the past couple weeks.  Pt. Alert and oriented at this time with no other complaints.

## 2014-08-06 NOTE — ED Notes (Signed)
Pt has a  Tick bite to to top of left foot.  Area red.

## 2014-08-18 ENCOUNTER — Ambulatory Visit: Payer: Self-pay | Admitting: General Surgery

## 2014-09-16 ENCOUNTER — Ambulatory Visit: Payer: Self-pay | Admitting: General Surgery

## 2014-10-20 ENCOUNTER — Encounter: Payer: Self-pay | Admitting: *Deleted

## 2014-11-15 ENCOUNTER — Emergency Department: Payer: Medicare HMO

## 2014-11-15 DIAGNOSIS — R002 Palpitations: Secondary | ICD-10-CM | POA: Diagnosis not present

## 2014-11-15 DIAGNOSIS — I10 Essential (primary) hypertension: Secondary | ICD-10-CM | POA: Insufficient documentation

## 2014-11-15 LAB — BASIC METABOLIC PANEL
Anion gap: 7 (ref 5–15)
BUN: 39 mg/dL — ABNORMAL HIGH (ref 6–20)
CHLORIDE: 105 mmol/L (ref 101–111)
CO2: 26 mmol/L (ref 22–32)
CREATININE: 1.62 mg/dL — AB (ref 0.61–1.24)
Calcium: 9.7 mg/dL (ref 8.9–10.3)
GFR calc Af Amer: 47 mL/min — ABNORMAL LOW (ref >60)
GFR, EST NON AFRICAN AMERICAN: 41 mL/min — AB (ref >60)
Glucose, Bld: 116 mg/dL — ABNORMAL HIGH (ref 65–99)
Potassium: 4.2 mmol/L (ref 3.5–5.1)
Sodium: 138 mmol/L (ref 135–145)

## 2014-11-15 LAB — CBC
HCT: 44 % (ref 40.0–52.0)
Hemoglobin: 15 g/dL (ref 13.0–18.0)
MCH: 33.7 pg (ref 26.0–34.0)
MCHC: 34.2 g/dL (ref 32.0–36.0)
MCV: 98.5 fL (ref 80.0–100.0)
Platelets: 203 10*3/uL (ref 150–440)
RBC: 4.47 MIL/uL (ref 4.40–5.90)
RDW: 13.5 % (ref 11.5–14.5)
WBC: 10.3 10*3/uL (ref 3.8–10.6)

## 2014-11-15 LAB — TROPONIN I: Troponin I: <0.03 ng/mL (ref <0.031)

## 2014-11-15 NOTE — ED Notes (Signed)
Pt to triage via w/c with no distress noted; pt reports feeling pulse "jumping"; denies any accomp symptoms

## 2014-11-16 ENCOUNTER — Emergency Department
Admission: EM | Admit: 2014-11-16 | Discharge: 2014-11-16 | Discharge status: Against Medical Advice | Payer: Medicare HMO | Attending: Student | Admitting: Student

## 2014-11-17 ENCOUNTER — Telehealth: Payer: Self-pay | Admitting: Emergency Medicine

## 2014-11-17 NOTE — ED Notes (Signed)
Called patient due to lwot to inquire about condition and follow up plans. Patient says he called his doctor neelham khan and they will see him tomorrow.  He says he had another episode this am with pain into his jaw.  i told him he should  Return to ED if he has symptoms.  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  i told him that we do not know that this is his esophogus and not his heart, and advised him to return if he desires prior to seeing his doctor in am.

## 2016-04-12 DIAGNOSIS — R0789 Other chest pain: Secondary | ICD-10-CM | POA: Insufficient documentation

## 2016-04-12 DIAGNOSIS — R195 Other fecal abnormalities: Secondary | ICD-10-CM | POA: Insufficient documentation

## 2016-04-12 DIAGNOSIS — R1319 Other dysphagia: Secondary | ICD-10-CM | POA: Insufficient documentation

## 2016-04-12 DIAGNOSIS — K30 Functional dyspepsia: Secondary | ICD-10-CM | POA: Insufficient documentation

## 2016-04-18 ENCOUNTER — Other Ambulatory Visit (HOSPITAL_COMMUNITY): Payer: Self-pay | Admitting: Nurse Practitioner

## 2016-04-18 ENCOUNTER — Other Ambulatory Visit: Payer: Self-pay | Admitting: Nurse Practitioner

## 2016-04-18 DIAGNOSIS — R1310 Dysphagia, unspecified: Secondary | ICD-10-CM

## 2016-04-20 IMAGING — CT CT STONE STUDY
2 of 4 series · 16 of 46 positions shown, 18 images · non-contrast
Comparison: CT of the abdomen and pelvis performed 11/04/2004

CLINICAL DATA: Intermittent right lower quadrant abdominal pain.

EXAM:
CT ABDOMEN AND PELVIS WITHOUT CONTRAST
TECHNIQUE: Multidetector CT imaging of the abdomen and pelvis was performed
following the standard protocol without IV contrast.

[Series 2: stone standard full · axial · 0.88mm/px · z∈[-426,-6]mm · 13 of 92 slices shown, 15 images]
[im 4/92  soft-tissue]
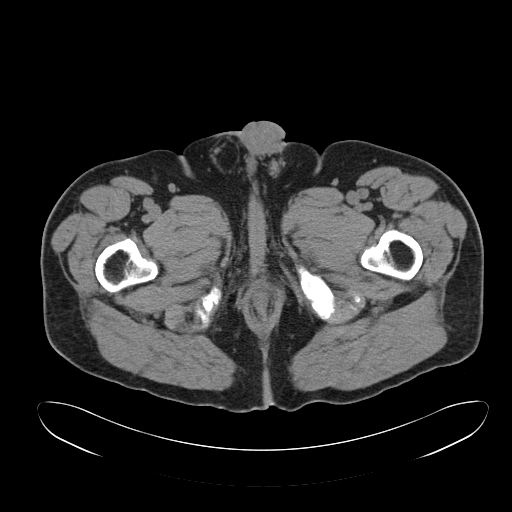
[im 4/92  bone]
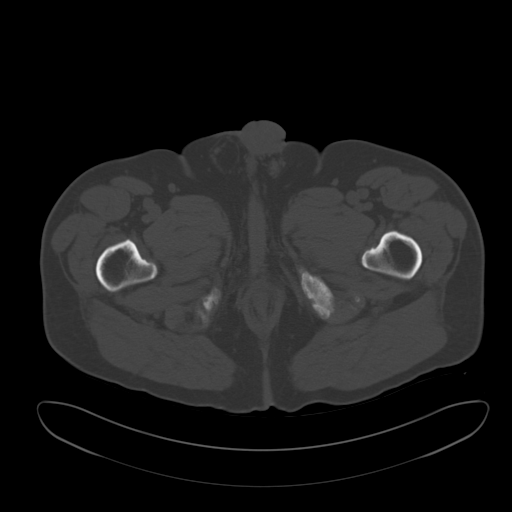
[im 11/92  soft-tissue]
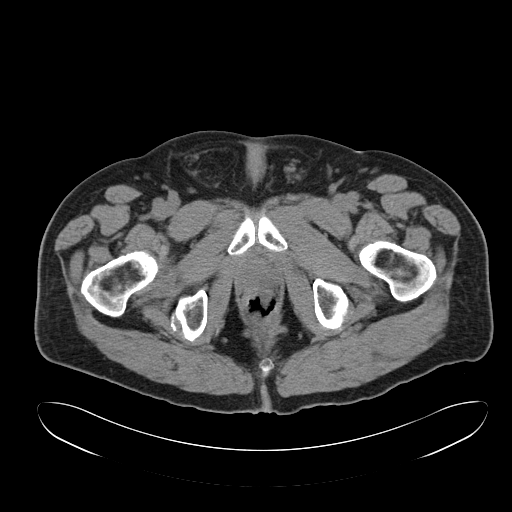
[im 19/92  soft-tissue]
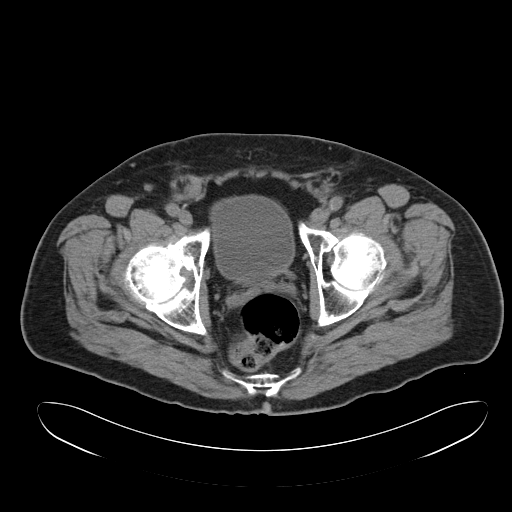
[im 26/92  soft-tissue]
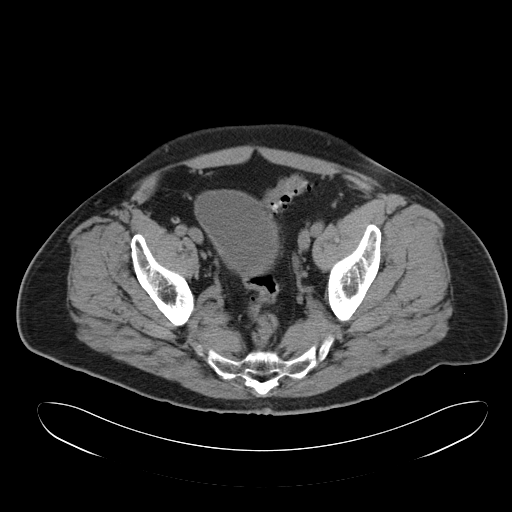
[im 33/92  soft-tissue]
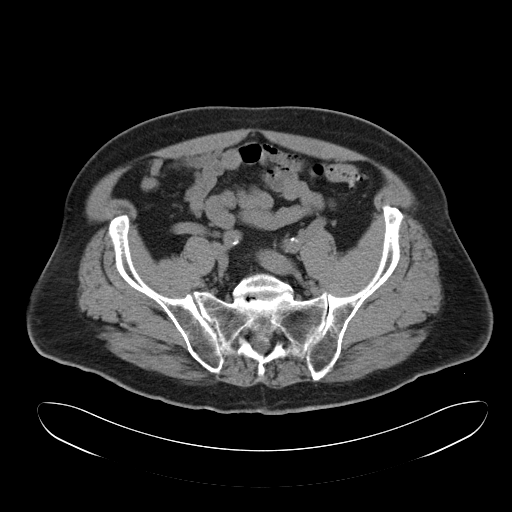
[im 41/92  soft-tissue]
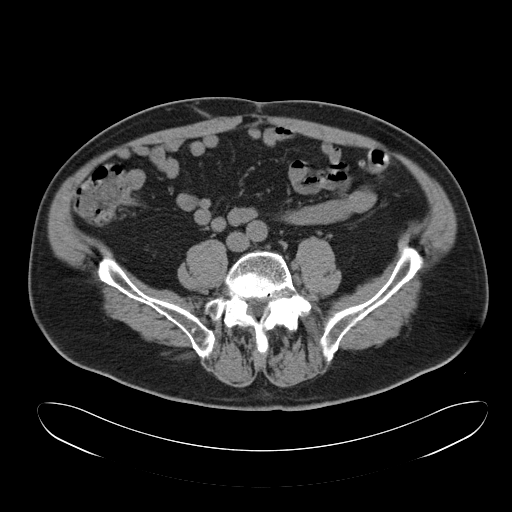
[im 48/92  soft-tissue]
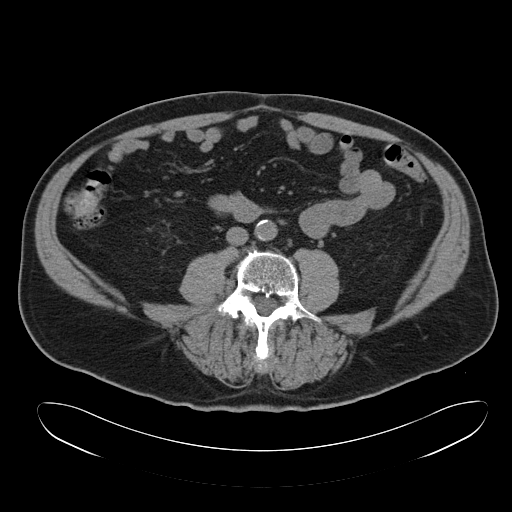
[im 51/92  soft-tissue]
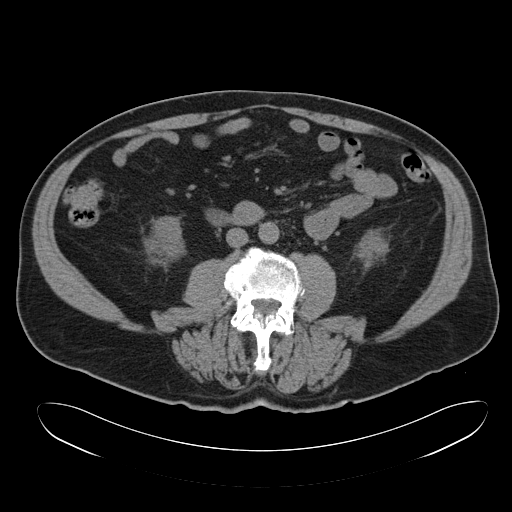
[im 59/92  soft-tissue]
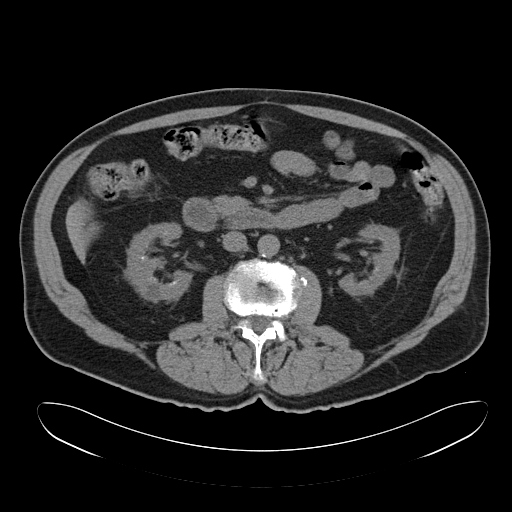
[im 59/92  bone]
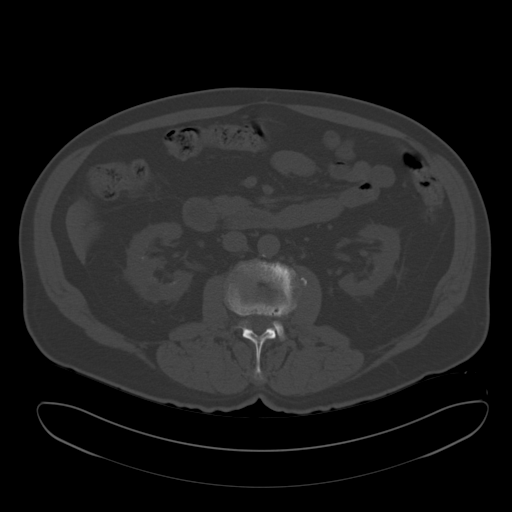
[im 66/92  soft-tissue]
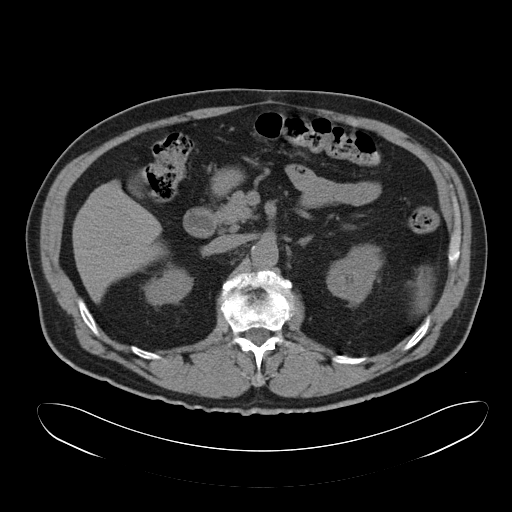
[im 73/92  soft-tissue]
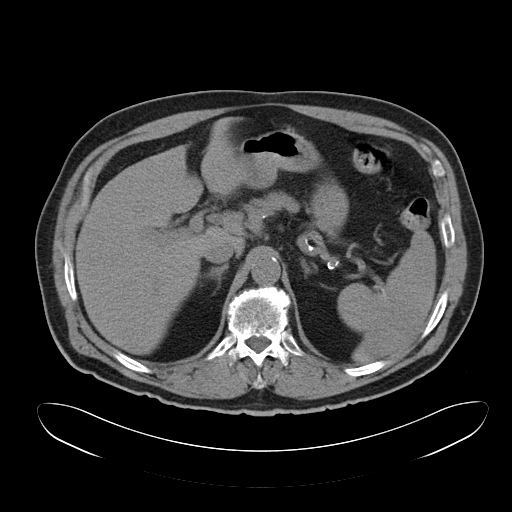
[im 81/92  soft-tissue]
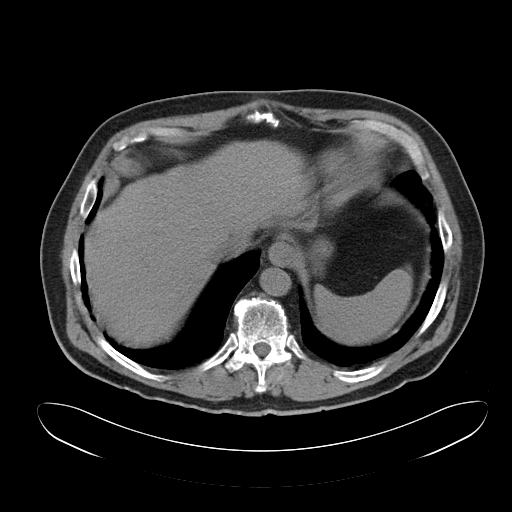
[im 88/92  soft-tissue]
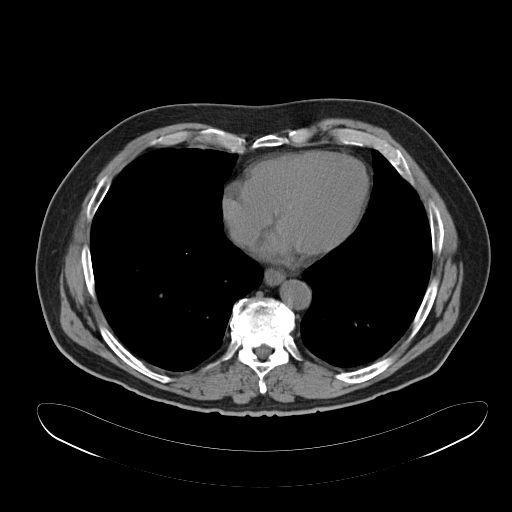

[Series 5: cor stone standard full · coronal · 0.92mm/px · 3 of 137 slices shown]
[im 46/137  soft-tissue]
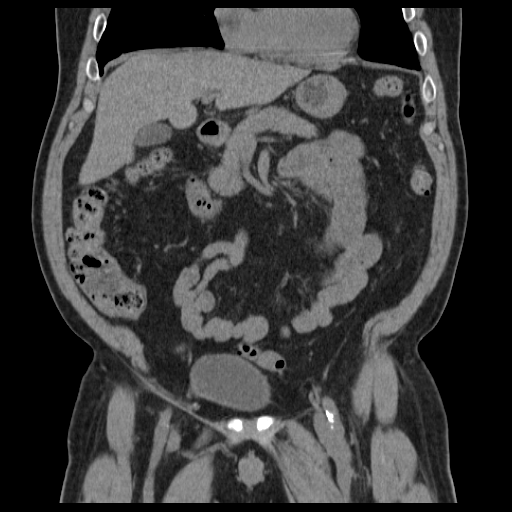
[im 61/137  soft-tissue]
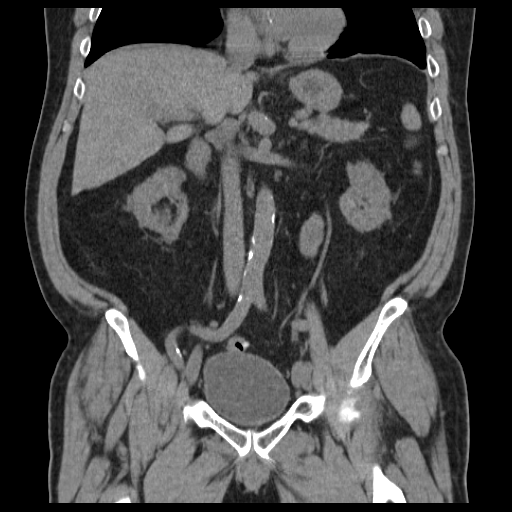
[im 76/137  soft-tissue]
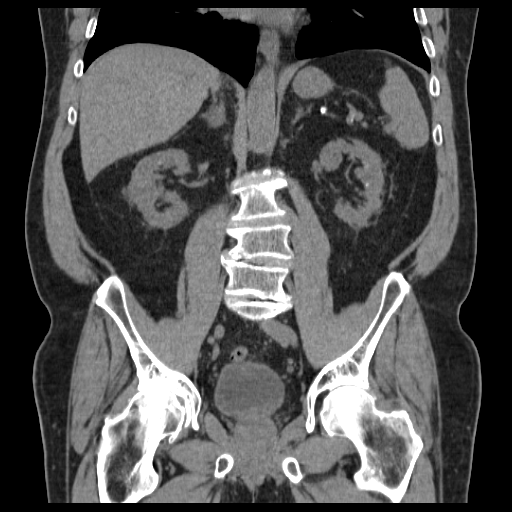

[16 of 46 positions shown; findings below may reference images not displayed]

FINDINGS: Mild bibasilar atelectasis or scarring is noted.

The liver and spleen are unremarkable in appearance. The gallbladder
is within normal limits. The pancreas and adrenal glands are
unremarkable.

Nonspecific perinephric stranding is noted bilaterally. There is no
evidence of hydronephrosis. No obstructing ureteral stones are seen.
Several right-sided renal cysts are noted, the largest of which is
parapelvic in nature, measuring 2.6 cm near the lower pole of the
right kidney. Scattered nonobstructing bilateral renal stones are
noted, measuring up to 4 mm in size.

No free fluid is identified. The small bowel is unremarkable in
appearance. The stomach is within normal limits. No acute vascular
abnormalities are seen. Mild scattered calcification is seen along
the abdominal aorta and its branches.

The appendix is normal in caliber and contains trace air and
high-density fluid. There is no evidence for appendicitis. Mild
diverticulosis is noted along the proximal sigmoid colon, without
evidence of diverticulitis.

The bladder is mildly distended. Incidental note is made of slight
herniation of the anterior right aspect of the bladder into a
moderate right inguinal hernia, which otherwise contains only fat.
The prostate remains normal in size, with minimal calcification. No
inguinal lymphadenopathy is seen.

No acute osseous abnormalities are identified. Multilevel vacuum
phenomenon is noted along the lower thoracic and lumbar spine.
IMPRESSION: 1. No acute abnormality seen to explain the patient's symptoms.
2. Slight herniation of the anterior right aspect of the bladder
into a moderate right inguinal hernia, which otherwise contains only
fat. The bladder is otherwise unremarkable.
3. Several right-sided renal cysts noted. Scattered nonobstructing
bilateral renal stones seen, measuring up to 4 mm in size.
4. Mild bibasilar atelectasis or scarring noted.
5. Mild scattered calcification along the abdominal aorta and its
branches.

## 2016-05-08 ENCOUNTER — Inpatient Hospital Stay: Admission: RE | Admit: 2016-05-08 | Payer: Medicare HMO | Source: Ambulatory Visit

## 2016-05-08 ENCOUNTER — Ambulatory Visit: Admission: RE | Admit: 2016-05-08 | Payer: Medicare HMO | Source: Ambulatory Visit

## 2016-05-16 ENCOUNTER — Other Ambulatory Visit: Payer: Medicare HMO

## 2016-05-16 ENCOUNTER — Ambulatory Visit: Payer: Medicare HMO

## 2016-05-18 ENCOUNTER — Ambulatory Visit
Admission: RE | Admit: 2016-05-18 | Discharge: 2016-05-18 | Disposition: A | Discharge status: Home / Self Care | Payer: Medicare PPO | Source: Ambulatory Visit | Attending: Nurse Practitioner | Admitting: Nurse Practitioner

## 2016-05-18 DIAGNOSIS — R1310 Dysphagia, unspecified: Secondary | ICD-10-CM

## 2016-05-18 DIAGNOSIS — K219 Gastro-esophageal reflux disease without esophagitis: Secondary | ICD-10-CM | POA: Insufficient documentation

## 2016-07-02 ENCOUNTER — Encounter: Admission: RE | Payer: Self-pay | Source: Ambulatory Visit

## 2016-07-02 ENCOUNTER — Ambulatory Visit
Admission: RE | Admit: 2016-07-02 | Payer: Medicare HMO | Source: Ambulatory Visit | Admitting: Unknown Physician Specialty

## 2016-07-02 SURGERY — COLONOSCOPY WITH PROPOFOL
Anesthesia: General

## 2016-08-13 ENCOUNTER — Encounter: Payer: Self-pay | Admitting: *Deleted

## 2016-08-21 ENCOUNTER — Ambulatory Visit: Payer: Medicare HMO | Admitting: Anesthesiology

## 2016-08-21 ENCOUNTER — Encounter
Admission: RE | Disposition: A | Discharge status: Home / Self Care | Payer: Self-pay | Source: Ambulatory Visit | Attending: Ophthalmology

## 2016-08-21 ENCOUNTER — Ambulatory Visit
Admission: RE | Admit: 2016-08-21 | Discharge: 2016-08-21 | Disposition: A | Discharge status: Home / Self Care | Payer: Medicare HMO | Source: Ambulatory Visit | Attending: Ophthalmology | Admitting: Ophthalmology

## 2016-08-21 DIAGNOSIS — Z885 Allergy status to narcotic agent status: Secondary | ICD-10-CM | POA: Diagnosis not present

## 2016-08-21 DIAGNOSIS — Z9981 Dependence on supplemental oxygen: Secondary | ICD-10-CM | POA: Diagnosis not present

## 2016-08-21 DIAGNOSIS — H9193 Unspecified hearing loss, bilateral: Secondary | ICD-10-CM | POA: Diagnosis not present

## 2016-08-21 DIAGNOSIS — H2511 Age-related nuclear cataract, right eye: Secondary | ICD-10-CM | POA: Diagnosis not present

## 2016-08-21 DIAGNOSIS — J449 Chronic obstructive pulmonary disease, unspecified: Secondary | ICD-10-CM | POA: Insufficient documentation

## 2016-08-21 DIAGNOSIS — Z884 Allergy status to anesthetic agent status: Secondary | ICD-10-CM | POA: Insufficient documentation

## 2016-08-21 DIAGNOSIS — Z886 Allergy status to analgesic agent status: Secondary | ICD-10-CM | POA: Diagnosis not present

## 2016-08-21 DIAGNOSIS — R011 Cardiac murmur, unspecified: Secondary | ICD-10-CM | POA: Insufficient documentation

## 2016-08-21 DIAGNOSIS — E039 Hypothyroidism, unspecified: Secondary | ICD-10-CM | POA: Diagnosis not present

## 2016-08-21 DIAGNOSIS — Z85828 Personal history of other malignant neoplasm of skin: Secondary | ICD-10-CM | POA: Insufficient documentation

## 2016-08-21 DIAGNOSIS — N289 Disorder of kidney and ureter, unspecified: Secondary | ICD-10-CM | POA: Insufficient documentation

## 2016-08-21 DIAGNOSIS — M1990 Unspecified osteoarthritis, unspecified site: Secondary | ICD-10-CM | POA: Insufficient documentation

## 2016-08-21 DIAGNOSIS — Z882 Allergy status to sulfonamides status: Secondary | ICD-10-CM | POA: Diagnosis not present

## 2016-08-21 DIAGNOSIS — I10 Essential (primary) hypertension: Secondary | ICD-10-CM | POA: Insufficient documentation

## 2016-08-21 DIAGNOSIS — K219 Gastro-esophageal reflux disease without esophagitis: Secondary | ICD-10-CM | POA: Diagnosis not present

## 2016-08-21 DIAGNOSIS — M109 Gout, unspecified: Secondary | ICD-10-CM | POA: Diagnosis not present

## 2016-08-21 HISTORY — DX: Hypothyroidism, unspecified: E03.9

## 2016-08-21 HISTORY — DX: Hypoxemia: R09.02

## 2016-08-21 HISTORY — DX: Gastro-esophageal reflux disease without esophagitis: K21.9

## 2016-08-21 HISTORY — DX: Malignant (primary) neoplasm, unspecified: C80.1

## 2016-08-21 HISTORY — DX: Chronic obstructive pulmonary disease, unspecified: J44.9

## 2016-08-21 HISTORY — DX: Unspecified hearing loss, unspecified ear: H91.90

## 2016-08-21 HISTORY — DX: Personal history of other specified conditions: Z87.898

## 2016-08-21 HISTORY — DX: Cardiac murmur, unspecified: R01.1

## 2016-08-21 HISTORY — DX: Dyspnea, unspecified: R06.00

## 2016-08-21 HISTORY — PX: CATARACT EXTRACTION W/PHACO: SHX586

## 2016-08-21 HISTORY — DX: Unspecified osteoarthritis, unspecified site: M19.90

## 2016-08-21 SURGERY — PHACOEMULSIFICATION, CATARACT, WITH IOL INSERTION
Anesthesia: Monitor Anesthesia Care | Site: Eye | Laterality: Right | Wound class: Clean

## 2016-08-21 MED ORDER — MOXIFLOXACIN HCL 0.5 % OP SOLN
OPHTHALMIC | Status: AC
  Filled 2016-08-21: qty 3

## 2016-08-21 MED ORDER — FENTANYL CITRATE (PF) 100 MCG/2ML IJ SOLN
INTRAMUSCULAR | Status: AC
  Filled 2016-08-21: qty 2

## 2016-08-21 MED ORDER — SODIUM CHLORIDE 0.9 % IV SOLN
INTRAVENOUS | Status: DC
  Administered 2016-08-21: 08:00:00 via INTRAVENOUS

## 2016-08-21 MED ORDER — NA CHONDROIT SULF-NA HYALURON 40-17 MG/ML IO SOLN
INTRAOCULAR | Status: DC | PRN
  Administered 2016-08-21: 1 mL via INTRAOCULAR

## 2016-08-21 MED ORDER — ARMC OPHTHALMIC DILATING DROPS
OPHTHALMIC | Status: AC
  Filled 2016-08-21: qty 0.4

## 2016-08-21 MED ORDER — FENTANYL CITRATE (PF) 100 MCG/2ML IJ SOLN
INTRAMUSCULAR | Status: DC | PRN
  Administered 2016-08-21: 25 ug via INTRAVENOUS

## 2016-08-21 MED ORDER — BSS IO SOLN
INTRAOCULAR | Status: DC | PRN
  Administered 2016-08-21: 4 mL via OPHTHALMIC

## 2016-08-21 MED ORDER — ARMC OPHTHALMIC DILATING DROPS
1.0000 "application " | OPHTHALMIC | Status: AC
  Administered 2016-08-21 (×3): 1 via OPHTHALMIC

## 2016-08-21 MED ORDER — MOXIFLOXACIN HCL 0.5 % OP SOLN: 1.0000 [drp] | OPHTHALMIC | Status: DC | PRN

## 2016-08-21 MED ORDER — MIDAZOLAM HCL 2 MG/2ML IJ SOLN
INTRAMUSCULAR | Status: AC
  Filled 2016-08-21: qty 2

## 2016-08-21 MED ORDER — MOXIFLOXACIN HCL 0.5 % OP SOLN
OPHTHALMIC | Status: DC | PRN
  Administered 2016-08-21: 0.2 mL via OPHTHALMIC

## 2016-08-21 MED ORDER — MIDAZOLAM HCL 2 MG/2ML IJ SOLN
INTRAMUSCULAR | Status: DC | PRN
  Administered 2016-08-21: 1 mg via INTRAVENOUS

## 2016-08-21 MED ORDER — EPINEPHRINE PF 1 MG/ML IJ SOLN
INTRAMUSCULAR | Status: DC | PRN
  Administered 2016-08-21: 09:00:00 via OPHTHALMIC

## 2016-08-21 MED ORDER — CARBACHOL 0.01 % IO SOLN
INTRAOCULAR | Status: DC | PRN
  Administered 2016-08-21: 0.5 mL via INTRAOCULAR

## 2016-08-21 MED ORDER — POVIDONE-IODINE 5 % OP SOLN
OPHTHALMIC | Status: DC | PRN
  Administered 2016-08-21: 1 via OPHTHALMIC

## 2016-08-21 SURGICAL SUPPLY — 16 items
GLOVE BIO SURGEON STRL SZ8 (GLOVE) ×3 IMPLANT
GLOVE BIOGEL M 6.5 STRL (GLOVE) ×3 IMPLANT
GLOVE SURG LX 8.0 MICRO (GLOVE) ×2
GLOVE SURG LX STRL 8.0 MICRO (GLOVE) ×1 IMPLANT
GOWN STRL REUS W/ TWL LRG LVL3 (GOWN DISPOSABLE) ×2 IMPLANT
GOWN STRL REUS W/TWL LRG LVL3 (GOWN DISPOSABLE) ×4
LABEL CATARACT MEDS ST (LABEL) ×3 IMPLANT
LENS IOL TECNIS ITEC 16.5 (Intraocular Lens) ×3 IMPLANT
PACK CATARACT (MISCELLANEOUS) ×3 IMPLANT
PACK CATARACT BRASINGTON LX (MISCELLANEOUS) ×3 IMPLANT
PACK EYE AFTER SURG (MISCELLANEOUS) ×3 IMPLANT
SOL BSS BAG (MISCELLANEOUS) ×3
SOLUTION BSS BAG (MISCELLANEOUS) ×1 IMPLANT
SYR 5ML LL (SYRINGE) ×3 IMPLANT
WATER STERILE IRR 250ML POUR (IV SOLUTION) ×3 IMPLANT
WIPE NON LINTING 3.25X3.25 (MISCELLANEOUS) ×3 IMPLANT

## 2016-08-21 NOTE — Discharge Instructions (Signed)
Eye Surgery Discharge Instructions  Expect mild scratchy sensation or mild soreness. DO NOT RUB YOUR EYE!  The day of surgery:  Minimal physical activity, but bed rest is not required  No reading, computer work, or close hand work  No bending, lifting, or straining.  May watch TV  For 24 hours:  No driving, legal decisions, or alcoholic beverages  Safety precautions  Eat anything you prefer: It is better to start with liquids, then soup then solid foods.  _____ Eye patch should be worn until postoperative exam tomorrow.  ____ Solar shield eyeglasses should be worn for comfort in the sunlight/patch while sleeping  Resume all regular medications including aspirin or Coumadin if these were discontinued prior to surgery. You may shower, bathe, shave, or wash your hair. Tylenol may be taken for mild discomfort.  Call your doctor if you experience significant pain, nausea, or vomiting, fever > 101 or other signs of infection. [phone] or [phone] Specific instructions:  Follow-up Information    Birder Robson, MD Follow up.   Specialty:  Ophthalmology Why:  July 11 at 9:10am Contact information: [address] [place] [address] [phone]

## 2016-08-21 NOTE — Anesthesia Postprocedure Evaluation (Signed)
Anesthesia Post Note  Patient: Bobby Lane  Procedure(s) Performed: Procedure(s) (LRB): CATARACT EXTRACTION PHACO AND INTRAOCULAR LENS PLACEMENT (IOC) (Right)  Patient location during evaluation: PACU Anesthesia Type: MAC Level of consciousness: awake Pain management: pain level controlled Vital Signs Assessment: post-procedure vital signs reviewed and stable Respiratory status: spontaneous breathing Cardiovascular status: blood pressure returned to baseline Postop Assessment: no signs of nausea or vomiting Anesthetic complications: no     Last Vitals:  Vitals:   08/21/16 0748 08/21/16 0904  BP: (!) 156/78 140/77  Pulse: (!) 57 (!) 52  Resp: 20 12  Temp: 36.4 C 36.6 C    Last Pain:  Vitals:   08/21/16 0904  TempSrc: Oral                 Hedda Slade

## 2016-08-21 NOTE — Anesthesia Post-op Follow-up Note (Cosign Needed)
Anesthesia QCDR form completed.        

## 2016-08-21 NOTE — Op Note (Signed)
PREOPERATIVE DIAGNOSIS:  Nuclear sclerotic cataract of the right eye.   POSTOPERATIVE DIAGNOSIS:  NUCLEAR SCLEROTIC CATARACT RIGHT EYE   OPERATIVE PROCEDURE: Procedure(s): CATARACT EXTRACTION PHACO AND INTRAOCULAR LENS PLACEMENT (IOC)   SURGEON:  Birder Robson, MD.   ANESTHESIA:  Anesthesiologist: Piscitello, Precious Haws, MD CRNA: Hedda Slade, CRNA  1.      Managed anesthesia care. 2.      0.63ml of Shugarcaine was instilled in the eye following the paracentesis.   COMPLICATIONS:  None.   TECHNIQUE:   Stop and chop   DESCRIPTION OF PROCEDURE:  The patient was examined and consented in the preoperative holding area where the aforementioned topical anesthesia was applied to the right eye and then brought back to the Operating Room where the right eye was prepped and draped in the usual sterile ophthalmic fashion and a lid speculum was placed. A paracentesis was created with the side port blade and the anterior chamber was filled with viscoelastic. A near clear corneal incision was performed with the steel keratome. A continuous curvilinear capsulorrhexis was performed with a cystotome followed by the capsulorrhexis forceps. Hydrodissection and hydrodelineation were carried out with BSS on a blunt cannula. The lens was removed in a stop and chop  technique and the remaining cortical material was removed with the irrigation-aspiration handpiece. The capsular bag was inflated with viscoelastic and the Technis [id number]  lens was placed in the capsular bag without complication. The remaining viscoelastic was removed from the eye with the irrigation-aspiration handpiece. The wounds were hydrated. The anterior chamber was flushed with Miostat and the eye was inflated to physiologic pressure. 0.77ml of Vigamox was placed in the anterior chamber. The wounds were found to be water tight. The eye was dressed with Vigamox. The patient was given protective glasses to wear throughout the day and a shield with which  to sleep tonight. The patient was also given drops with which to begin a drop regimen today and will follow-up with me in one day.  Implant Name Type Inv. Item Serial No. Manufacturer Lot No. LRB No. Used  LENS IOL DIOP 16.5 - [id number] Intraocular Lens LENS IOL DIOP 16.5 [id number] AMO   Right 1   Procedure(s) with comments: CATARACT EXTRACTION PHACO AND INTRAOCULAR LENS PLACEMENT (IOC) (Right) - [place] 00:54.9 AP% 18.3 CDE 10.04 Fluid pack lot # [id number]  Electronically signed: Ricardo 08/21/2016 9:02 AM

## 2016-08-21 NOTE — Anesthesia Procedure Notes (Signed)
Procedure Name: MAC Date/Time: 08/21/2016 8:45 AM Performed by: Hedda Slade Pre-anesthesia Checklist: Patient identified, Emergency Drugs available, Suction available and Patient being monitored Oxygen Delivery Method: Nasal cannula

## 2016-08-21 NOTE — Transfer of Care (Signed)
Immediate Anesthesia Transfer of Care Note  Patient: Bobby Lane  Procedure(s) Performed: Procedure(s) with comments: CATARACT EXTRACTION PHACO AND INTRAOCULAR LENS PLACEMENT (IOC) (Right) - [place] 00:54.9 AP% 18.3 CDE 10.04 Fluid pack lot # [id number]  Patient Location: PACU  Anesthesia Type:MAC  Level of Consciousness: awake, alert  and oriented  Airway & Oxygen Therapy: Patient Spontanous Breathing  Post-op Assessment: Report given to RN and Post -op Vital signs reviewed and stable  Post vital signs: Reviewed and stable  Last Vitals:  Vitals:   08/21/16 0748 08/21/16 0904  BP: (!) 156/78 140/77  Pulse: (!) 57 (!) 52  Resp: 20 12  Temp: 36.4 C 36.6 C    Last Pain:  Vitals:   08/21/16 0904  TempSrc: Oral         Complications: No apparent anesthesia complications

## 2016-08-21 NOTE — Anesthesia Preprocedure Evaluation (Signed)
Anesthesia Evaluation  Patient identified by MRN, date of birth, ID band Patient awake    Reviewed: Allergy & Precautions, H&P , NPO status , Patient's Chart, lab work & pertinent test results  History of Anesthesia Complications Negative for: history of anesthetic complications  Airway Mallampati: III  TM Distance: <3 FB Neck ROM: limited    Dental  (+) Poor Dentition, Chipped, Missing   Pulmonary shortness of breath and with exertion, COPD,  COPD inhaler and oxygen dependent,           Cardiovascular Exercise Tolerance: Poor hypertension, (-) angina+ CAD  (-) Past MI + Valvular Problems/Murmurs      Neuro/Psych negative neurological ROS  negative psych ROS   GI/Hepatic Neg liver ROS, GERD  Medicated and Controlled,  Endo/Other  Hypothyroidism   Renal/GU Renal disease     Musculoskeletal  (+) Arthritis ,   Abdominal   Peds  Hematology negative hematology ROS (+)   Anesthesia Other Findings Past Medical History: No date: Arthritis No date: Cancer ([place])     Comment: SKIN No date: COPD (chronic obstructive pulmonary disease) (* No date: Coronary artery disease No date: Dyspnea     Comment: DOE No date: GERD (gastroesophageal reflux disease) No date: Gout No date: Heart murmur No date: History of wheezing No date: HOH (hard of hearing) No date: Hypertension No date: Hypothyroidism No date: Oxygen deficit     Comment: PRN USE No date: Renal disorder No date: Thyroid disease  Past Surgical History: No date: FRACTURE SURGERY     Comment: ANKLE No date: HERNIA REPAIR No date: UPPER GI ENDOSCOPY  BMI    Body Mass Index:  35.99 kg/m      Reproductive/Obstetrics negative OB ROS                             Anesthesia Physical Anesthesia Plan  ASA: III  Anesthesia Plan: MAC   Post-op Pain Management:    Induction:   PONV Risk Score and Plan:   Airway Management  Planned:   Additional Equipment:   Intra-op Plan:   Post-operative Plan:   Informed Consent: I have reviewed the patients History and Physical, chart, labs and discussed the procedure including the risks, benefits and alternatives for the proposed anesthesia with the patient or authorized representative who has indicated his/her understanding and acceptance.     Plan Discussed with: Anesthesiologist, CRNA and Surgeon  Anesthesia Plan Comments:         Anesthesia Quick Evaluation

## 2016-08-21 NOTE — H&P (Signed)
All labs reviewed. Abnormal studies sent to patients PCP when indicated.  Previous H&P reviewed, patient examined, there are NO CHANGES.  Bobby Lane

## 2016-08-23 ENCOUNTER — Ambulatory Visit: Payer: Medicare HMO | Admitting: Podiatry

## 2016-09-06 ENCOUNTER — Ambulatory Visit: Payer: Medicare HMO | Admitting: Podiatry

## 2016-09-12 ENCOUNTER — Encounter: Payer: Self-pay | Admitting: *Deleted

## 2016-09-18 ENCOUNTER — Encounter: Payer: Self-pay | Admitting: *Deleted

## 2016-09-18 ENCOUNTER — Ambulatory Visit: Payer: Medicare HMO | Admitting: Anesthesiology

## 2016-09-18 ENCOUNTER — Encounter
Admission: RE | Disposition: A | Discharge status: Home / Self Care | Payer: Self-pay | Source: Ambulatory Visit | Attending: Ophthalmology

## 2016-09-18 ENCOUNTER — Ambulatory Visit
Admission: RE | Admit: 2016-09-18 | Discharge: 2016-09-18 | Disposition: A | Discharge status: Home / Self Care | Payer: Medicare HMO | Source: Ambulatory Visit | Attending: Ophthalmology | Admitting: Ophthalmology

## 2016-09-18 DIAGNOSIS — E039 Hypothyroidism, unspecified: Secondary | ICD-10-CM | POA: Diagnosis not present

## 2016-09-18 DIAGNOSIS — J449 Chronic obstructive pulmonary disease, unspecified: Secondary | ICD-10-CM | POA: Diagnosis not present

## 2016-09-18 DIAGNOSIS — Z79899 Other long term (current) drug therapy: Secondary | ICD-10-CM | POA: Diagnosis not present

## 2016-09-18 DIAGNOSIS — I2510 Atherosclerotic heart disease of native coronary artery without angina pectoris: Secondary | ICD-10-CM | POA: Insufficient documentation

## 2016-09-18 DIAGNOSIS — I10 Essential (primary) hypertension: Secondary | ICD-10-CM | POA: Diagnosis not present

## 2016-09-18 DIAGNOSIS — H2512 Age-related nuclear cataract, left eye: Secondary | ICD-10-CM | POA: Insufficient documentation

## 2016-09-18 DIAGNOSIS — K219 Gastro-esophageal reflux disease without esophagitis: Secondary | ICD-10-CM | POA: Insufficient documentation

## 2016-09-18 DIAGNOSIS — Z9981 Dependence on supplemental oxygen: Secondary | ICD-10-CM | POA: Diagnosis not present

## 2016-09-18 HISTORY — PX: CATARACT EXTRACTION W/PHACO: SHX586

## 2016-09-18 HISTORY — DX: Personal history of other diseases of the digestive system: Z87.19

## 2016-09-18 HISTORY — DX: Personal history of urinary calculi: Z87.442

## 2016-09-18 SURGERY — PHACOEMULSIFICATION, CATARACT, WITH IOL INSERTION
Anesthesia: Monitor Anesthesia Care | Site: Eye | Laterality: Left | Wound class: Clean

## 2016-09-18 MED ORDER — SODIUM CHLORIDE 0.9 % IV SOLN
INTRAVENOUS | Status: DC
  Administered 2016-09-18: 08:00:00 via INTRAVENOUS

## 2016-09-18 MED ORDER — BSS IO SOLN
INTRAOCULAR | Status: DC | PRN
  Administered 2016-09-18: 2 mL via OPHTHALMIC

## 2016-09-18 MED ORDER — MIDAZOLAM HCL 2 MG/2ML IJ SOLN
INTRAMUSCULAR | Status: DC | PRN
  Administered 2016-09-18: 2 mg via INTRAVENOUS

## 2016-09-18 MED ORDER — NA CHONDROIT SULF-NA HYALURON 40-17 MG/ML IO SOLN
INTRAOCULAR | Status: DC | PRN
  Administered 2016-09-18: 1 mL via INTRAOCULAR

## 2016-09-18 MED ORDER — MOXIFLOXACIN HCL 0.5 % OP SOLN: 1.0000 [drp] | OPHTHALMIC | Status: DC | PRN

## 2016-09-18 MED ORDER — ARMC OPHTHALMIC DILATING DROPS
1.0000 "application " | OPHTHALMIC | Status: AC
  Administered 2016-09-18 (×3): 1 via OPHTHALMIC

## 2016-09-18 MED ORDER — EPINEPHRINE PF 1 MG/ML IJ SOLN
INTRAOCULAR | Status: DC | PRN
  Administered 2016-09-18: 1 mL via OPHTHALMIC

## 2016-09-18 MED ORDER — CARBACHOL 0.01 % IO SOLN
INTRAOCULAR | Status: DC | PRN
Start: 2016-09-18 — End: 2016-09-18
  Administered 2016-09-18: .5 mL via INTRAOCULAR

## 2016-09-18 MED ORDER — POVIDONE-IODINE 5 % OP SOLN
OPHTHALMIC | Status: DC | PRN
  Administered 2016-09-18: 1 via OPHTHALMIC

## 2016-09-18 MED ORDER — MOXIFLOXACIN HCL 0.5 % OP SOLN
OPHTHALMIC | Status: AC
  Filled 2016-09-18: qty 3

## 2016-09-18 MED ORDER — ARMC OPHTHALMIC DILATING DROPS
OPHTHALMIC | Status: AC
  Filled 2016-09-18: qty 0.4

## 2016-09-18 MED ORDER — MIDAZOLAM HCL 2 MG/2ML IJ SOLN
INTRAMUSCULAR | Status: AC
  Filled 2016-09-18: qty 2

## 2016-09-18 MED ORDER — MOXIFLOXACIN HCL 0.5 % OP SOLN
OPHTHALMIC | Status: DC | PRN
  Administered 2016-09-18: .2 mL via OPHTHALMIC

## 2016-09-18 SURGICAL SUPPLY — 16 items
GLOVE BIO SURGEON STRL SZ8 (GLOVE) ×3 IMPLANT
GLOVE BIOGEL M 6.5 STRL (GLOVE) ×3 IMPLANT
GLOVE SURG LX 8.0 MICRO (GLOVE) ×2
GLOVE SURG LX STRL 8.0 MICRO (GLOVE) ×1 IMPLANT
GOWN STRL REUS W/ TWL LRG LVL3 (GOWN DISPOSABLE) ×2 IMPLANT
GOWN STRL REUS W/TWL LRG LVL3 (GOWN DISPOSABLE) ×4
LABEL CATARACT MEDS ST (LABEL) ×3 IMPLANT
LENS IOL TECNIS ITEC 15.5 (Intraocular Lens) ×3 IMPLANT
PACK CATARACT (MISCELLANEOUS) ×3 IMPLANT
PACK CATARACT BRASINGTON LX (MISCELLANEOUS) ×3 IMPLANT
PACK EYE AFTER SURG (MISCELLANEOUS) ×3 IMPLANT
SOL BSS BAG (MISCELLANEOUS) ×3
SOLUTION BSS BAG (MISCELLANEOUS) ×1 IMPLANT
SYR 5ML LL (SYRINGE) ×3 IMPLANT
WATER STERILE IRR 250ML POUR (IV SOLUTION) ×3 IMPLANT
WIPE NON LINTING 3.25X3.25 (MISCELLANEOUS) ×3 IMPLANT

## 2016-09-18 NOTE — Anesthesia Postprocedure Evaluation (Signed)
Anesthesia Post Note  Patient: Bobby Lane  Procedure(s) Performed: Procedure(s) (LRB): CATARACT EXTRACTION PHACO AND INTRAOCULAR LENS PLACEMENT (IOC) (Left)  Patient location during evaluation: Short Stay Anesthesia Type: MAC Level of consciousness: awake and alert Pain management: pain level controlled Vital Signs Assessment: post-procedure vital signs reviewed and stable Respiratory status: spontaneous breathing, nonlabored ventilation, respiratory function stable and patient connected to nasal cannula oxygen Cardiovascular status: stable and blood pressure returned to baseline Anesthetic complications: no     Last Vitals:  Vitals:   09/18/16 0734 09/18/16 0842  BP: (!) 141/86 139/72  Pulse: (!) 57 (!) 56  Resp: 16 12  Temp: 36.6 C 36.6 C    Last Pain:  Vitals:   09/18/16 0842  TempSrc: Oral                 Randal Yepiz,  Clearnce Sorrel

## 2016-09-18 NOTE — Discharge Instructions (Signed)
Eye Surgery Discharge Instructions  Expect mild scratchy sensation or mild soreness. DO NOT RUB YOUR EYE!  The day of surgery:  Minimal physical activity, but bed rest is not required  No reading, computer work, or close hand work  No bending, lifting, or straining.  May watch TV  For 24 hours:  No driving, legal decisions, or alcoholic beverages  Safety precautions  Eat anything you prefer: It is better to start with liquids, then soup then solid foods.  _____ Eye patch should be worn until postoperative exam tomorrow.  ____ Solar shield eyeglasses should be worn for comfort in the sunlight/patch while sleeping  Resume all regular medications including aspirin or Coumadin if these were discontinued prior to surgery. You may shower, bathe, shave, or wash your hair. Tylenol may be taken for mild discomfort.  Call your doctor if you experience significant pain, nausea, or vomiting, fever > 101 or other signs of infection. [phone] or [phone] Specific instructions:  Follow-up Information    Birder Robson, MD Follow up.   Specialty:  Ophthalmology Why:  August 8 at 10:05am Contact information: [address] [place] [address] [phone]

## 2016-09-18 NOTE — Anesthesia Preprocedure Evaluation (Signed)
Anesthesia Evaluation  Patient identified by MRN, date of birth, ID band Patient awake    Reviewed: Allergy & Precautions, H&P , NPO status , Patient's Chart, lab work & pertinent test results  History of Anesthesia Complications Negative for: history of anesthetic complications  Airway Mallampati: III  TM Distance: <3 FB Neck ROM: limited    Dental  (+) Poor Dentition, Chipped, Missing   Pulmonary shortness of breath and with exertion, COPD,  COPD inhaler and oxygen dependent,           Cardiovascular Exercise Tolerance: Poor hypertension, (-) angina+ CAD  (-) Past MI + Valvular Problems/Murmurs      Neuro/Psych negative neurological ROS  negative psych ROS   GI/Hepatic Neg liver ROS, hiatal hernia, GERD  Medicated and Controlled,  Endo/Other  Hypothyroidism   Renal/GU Renal disease     Musculoskeletal  (+) Arthritis ,   Abdominal   Peds  Hematology negative hematology ROS (+)   Anesthesia Other Findings Past Medical History: No date: Arthritis No date: Cancer ([place])     Comment: SKIN No date: COPD (chronic obstructive pulmonary disease) (* No date: Coronary artery disease No date: Dyspnea     Comment: DOE No date: GERD (gastroesophageal reflux disease) No date: Gout No date: Heart murmur No date: History of wheezing No date: HOH (hard of hearing) No date: Hypertension No date: Hypothyroidism No date: Oxygen deficit     Comment: PRN USE No date: Renal disorder No date: Thyroid disease  Past Surgical History: No date: FRACTURE SURGERY     Comment: ANKLE No date: HERNIA REPAIR No date: UPPER GI ENDOSCOPY  BMI    Body Mass Index:  35.99 kg/m      Reproductive/Obstetrics negative OB ROS                             Anesthesia Physical  Anesthesia Plan  ASA: III  Anesthesia Plan: MAC   Post-op Pain Management:    Induction:   PONV Risk Score and Plan:    Airway Management Planned:   Additional Equipment:   Intra-op Plan:   Post-operative Plan:   Informed Consent: I have reviewed the patients History and Physical, chart, labs and discussed the procedure including the risks, benefits and alternatives for the proposed anesthesia with the patient or authorized representative who has indicated his/her understanding and acceptance.     Plan Discussed with: Anesthesiologist, CRNA and Surgeon  Anesthesia Plan Comments:         Anesthesia Quick Evaluation

## 2016-09-18 NOTE — Anesthesia Post-op Follow-up Note (Signed)
Anesthesia QCDR form completed.        

## 2016-09-18 NOTE — Op Note (Signed)
PREOPERATIVE DIAGNOSIS:  Nuclear sclerotic cataract of the left eye.   POSTOPERATIVE DIAGNOSIS:  Nuclear sclerotic cataract of the left eye.   OPERATIVE PROCEDURE: Procedure(s): CATARACT EXTRACTION PHACO AND INTRAOCULAR LENS PLACEMENT (IOC)   SURGEON:  Birder Robson, MD.   ANESTHESIA:  Anesthesiologist: Piscitello, Precious Haws, MD CRNA: Aline Brochure, CRNA  1.      Managed anesthesia care. 2.     0.75ml of Shugarcaine was instilled following the paracentesis   COMPLICATIONS:  None.   TECHNIQUE:   Stop and chop   DESCRIPTION OF PROCEDURE:  The patient was examined and consented in the preoperative holding area where the aforementioned topical anesthesia was applied to the left eye and then brought back to the Operating Room where the left eye was prepped and draped in the usual sterile ophthalmic fashion and a lid speculum was placed. A paracentesis was created with the side port blade and the anterior chamber was filled with viscoelastic. A near clear corneal incision was performed with the steel keratome. A continuous curvilinear capsulorrhexis was performed with a cystotome followed by the capsulorrhexis forceps. Hydrodissection and hydrodelineation were carried out with BSS on a blunt cannula. The lens was removed in a stop and chop  technique and the remaining cortical material was removed with the irrigation-aspiration handpiece. The capsular bag was inflated with viscoelastic and the Technis [id number] lens was placed in the capsular bag without complication. The remaining viscoelastic was removed from the eye with the irrigation-aspiration handpiece. The wounds were hydrated. The anterior chamber was flushed with Miostat and the eye was inflated to physiologic pressure. 0.3ml Vigamox was placed in the anterior chamber. The wounds were found to be water tight. The eye was dressed with Vigamox. The patient was given protective glasses to wear throughout the day and a shield with which to  sleep tonight. The patient was also given drops with which to begin a drop regimen today and will follow-up with me in one day.  Implant Name Type Inv. Item Serial No. Manufacturer Lot No. LRB No. Used  LENS IOL DIOP 15.5 - [id number] Intraocular Lens LENS IOL DIOP 15.5 [id number]   Left 1    Procedure(s) with comments: CATARACT EXTRACTION PHACO AND INTRAOCULAR LENS PLACEMENT (IOC) (Left) - [place] 00:56 AP% 19.7 CDE 11.07 Fluid pack lot # [id number]  Electronically signed: [place] 09/18/2016 8:40 AM

## 2016-09-18 NOTE — Transfer of Care (Signed)
Immediate Anesthesia Transfer of Care Note  Patient: Bobby Lane  Procedure(s) Performed: Procedure(s) with comments: CATARACT EXTRACTION PHACO AND INTRAOCULAR LENS PLACEMENT (IOC) (Left) - [place] 00:56 AP% 19.7 CDE 11.07 Fluid pack lot # [id number]  Patient Location: Short Stay  Anesthesia Type:MAC  Level of Consciousness: awake, alert  and oriented  Airway & Oxygen Therapy: Patient Spontanous Breathing  Post-op Assessment: Report given to RN  Post vital signs: stable  Last Vitals:  Vitals:   09/18/16 0734 09/18/16 0842  BP: (!) 141/86 139/72  Pulse: (!) 57 (!) 56  Resp: 16 12  Temp: 36.6 C 36.6 C    Last Pain:  Vitals:   09/18/16 0842  TempSrc: Oral         Complications: No apparent anesthesia complications

## 2016-09-18 NOTE — H&P (Signed)
All labs reviewed. Abnormal studies sent to patients PCP when indicated.  Previous H&P reviewed, patient examined, there are NO CHANGES.  Bobby Que LOUIS8/7/20188:13 AM

## 2017-04-01 ENCOUNTER — Other Ambulatory Visit: Payer: Self-pay | Admitting: Nephrology

## 2017-04-01 DIAGNOSIS — N183 Chronic kidney disease, stage 3 unspecified: Secondary | ICD-10-CM

## 2017-04-08 ENCOUNTER — Ambulatory Visit: Payer: Medicare HMO

## 2017-04-17 ENCOUNTER — Ambulatory Visit
Admission: RE | Admit: 2017-04-17 | Discharge: 2017-04-17 | Disposition: A | Discharge status: Home / Self Care | Payer: Medicare HMO | Source: Ambulatory Visit | Attending: Nephrology | Admitting: Nephrology

## 2017-04-17 DIAGNOSIS — N183 Chronic kidney disease, stage 3 unspecified: Secondary | ICD-10-CM

## 2017-04-17 DIAGNOSIS — N200 Calculus of kidney: Secondary | ICD-10-CM | POA: Insufficient documentation

## 2017-05-21 ENCOUNTER — Ambulatory Visit
Admission: RE | Admit: 2017-05-21 | Discharge: 2017-05-21 | Disposition: A | Discharge status: Home / Self Care | Payer: Medicare HMO | Source: Ambulatory Visit | Attending: Urology | Admitting: Urology

## 2017-05-21 ENCOUNTER — Encounter: Payer: Self-pay | Admitting: Urology

## 2017-05-21 ENCOUNTER — Ambulatory Visit (INDEPENDENT_AMBULATORY_CARE_PROVIDER_SITE_OTHER): Payer: Medicare HMO | Admitting: Urology

## 2017-05-21 VITALS — BP 157/78 | HR 61 | Resp 16 | Ht 65.0 in | Wt 227.5 lb

## 2017-05-21 DIAGNOSIS — N200 Calculus of kidney: Secondary | ICD-10-CM

## 2017-05-21 LAB — URINALYSIS, COMPLETE
BILIRUBIN UA: NEGATIVE
GLUCOSE, UA: NEGATIVE
Ketones, UA: NEGATIVE
Leukocytes, UA: NEGATIVE
NITRITE UA: NEGATIVE
PH UA: 5.5 (ref 5.0–7.5)
PROTEIN UA: NEGATIVE
RBC UA: NEGATIVE
Specific Gravity, UA: 1.02 (ref 1.005–1.030)
UUROB: 0.2 mg/dL (ref 0.2–1.0)

## 2017-05-21 LAB — MICROSCOPIC EXAMINATION
Epithelial Cells (non renal): NONE SEEN /hpf (ref 0–10)
RBC MICROSCOPIC, UA: NONE SEEN /HPF (ref 0–2)

## 2017-05-21 MED ORDER — TAMSULOSIN HCL 0.4 MG PO CAPS: 0.4000 mg | ORAL_CAPSULE | Freq: Every day | ORAL | 0 refills | Status: DC

## 2017-05-21 NOTE — Progress Notes (Signed)
05/21/2017 3:02 PM   Bobby Lane 1940-03-19 [id number]  Referring provider: Lavera Guise, [place], [place] [address]  Chief Complaint  Patient presents with  . Nephrolithiasis    HPI: 77 year old male referred by Dr. Doroteo Glassman evaluation of nephrolithiasis.  He is followed for chronic kidney disease and had a recent renal ultrasound which showed a right renal cyst and probable nonobstructing renal calculi in the left kidney the largest measuring 12 mm.  He denies flank or abdominal pain.  He states he used to have problems with "sand stones" that resolved after he was placed on allopurinol.   He does complain of lower urinary tract symptoms including urinary frequency, urgency, nocturia x2 and postvoid dribbling.  He also complains of retraction of his penis into the suprapubic fat.   PMH: Past Medical History:  Diagnosis Date  . Arthritis   . Cancer ([place])    SKIN  . COPD (chronic obstructive pulmonary disease) ([place])   . Coronary artery disease   . Dyspnea    DOE  . GERD (gastroesophageal reflux disease)   . Gout   . Heart murmur   . History of hiatal hernia   . History of kidney stones   . History of wheezing   . HOH (hard of hearing)   . Hypertension   . Hypothyroidism   . Oxygen deficit    PRN USE  . Renal disorder   . Thyroid disease     Surgical History: Past Surgical History:  Procedure Laterality Date  . CATARACT EXTRACTION W/PHACO Right 08/21/2016   Procedure: CATARACT EXTRACTION PHACO AND INTRAOCULAR LENS PLACEMENT (IOC);  Surgeon: Birder Robson, MD;  Location: [facility] ORS;  Service: Ophthalmology;  Laterality: Right;  [place] 00:54.9 AP% 18.3 CDE 10.04 Fluid pack lot # [id number]  . CATARACT EXTRACTION W/PHACO Left 09/18/2016   Procedure: CATARACT EXTRACTION PHACO AND INTRAOCULAR LENS PLACEMENT (IOC);  Surgeon: Birder Robson, MD;  Location: [facility] ORS;  Service: Ophthalmology;  Laterality: Left;  [place] 00:56 AP% 19.7 CDE 11.07 Fluid  pack lot # [id number]  . FRACTURE SURGERY     ANKLE  . HERNIA REPAIR    . UPPER GI ENDOSCOPY      Home Medications:  Allergies as of 05/21/2017      Reactions   Codeine Nausea And Vomiting   Morphine And Related Nausea And Vomiting   Novocain [procaine] Hives   NO TROUBLE IN OR   Other Nausea And Vomiting   Percocet [oxycodone-acetaminophen] Nausea And Vomiting   Sulfa Antibiotics Hives   Tramadol Nausea And Vomiting   Note: upset stomach Note: upset stomach      Medication List        Accurate as of 05/21/17  3:02 PM. Always use your most recent med list.          allopurinol 300 MG tablet Commonly known as:  ZYLOPRIM Take 300 mg by mouth daily.   cloNIDine 0.2 MG tablet Commonly known as:  CATAPRES Take 0.1 mg by mouth 2 (two) times daily.   DULoxetine 30 MG capsule Commonly known as:  CYMBALTA Take 30 mg by mouth daily.   levothyroxine 112 MCG tablet Commonly known as:  SYNTHROID, LEVOTHROID Take 112 mcg by mouth daily before breakfast.   losartan 100 MG tablet Commonly known as:  COZAAR Take 100 mg by mouth daily.   metoprolol tartrate 100 MG tablet Commonly known as:  LOPRESSOR Take 100 mg by mouth daily. Taking 50mg    pantoprazole  40 MG tablet Commonly known as:  PROTONIX Take 40 mg by mouth daily.   spironolactone 25 MG tablet Commonly known as:  ALDACTONE Take 25 mg by mouth daily.   Vitamin D3 5000 units Tabs Take 5,000 Units by mouth daily.       Allergies:  Allergies  Allergen Reactions  . Codeine Nausea And Vomiting  . Morphine And Related Nausea And Vomiting  . Novocain [Procaine] Hives    NO TROUBLE IN OR  . Other Nausea And Vomiting  . Percocet [Oxycodone-Acetaminophen] Nausea And Vomiting  . Sulfa Antibiotics Hives  . Tramadol Nausea And Vomiting    Note: upset stomach Note: upset stomach     Family History: Family History  Problem Relation Age of Onset  . Prostate cancer Neg Hx   . Bladder Cancer Neg Hx   . Kidney  cancer Neg Hx     Social History:  reports that he has never smoked. He has never used smokeless tobacco. He reports that he does not drink alcohol or use drugs.  ROS: UROLOGY Frequent Urination?: Yes Hard to postpone urination?: Yes Burning/pain with urination?: Yes Get up at night to urinate?: Yes Leakage of urine?: Yes Urine stream starts and stops?: No Trouble starting stream?: No Do you have to strain to urinate?: No Blood in urine?: No Urinary tract infection?: No Sexually transmitted disease?: No Injury to kidneys or bladder?: Yes Painful intercourse?: No Weak stream?: No Erection problems?: Yes Penile pain?: Yes  Gastrointestinal Nausea?: Yes Vomiting?: No Indigestion/heartburn?: Yes Diarrhea?: No Constipation?: Yes  Constitutional Fever: No Night sweats?: Yes Weight loss?: No Fatigue?: Yes  Skin Skin rash/lesions?: Yes Itching?: Yes  Eyes Blurred vision?: No Double vision?: No  Ears/Nose/Throat Sore throat?: No Sinus problems?: No  Hematologic/Lymphatic Swollen glands?: No Easy bruising?: No  Cardiovascular Leg swelling?: Yes Chest pain?: No  Respiratory Cough?: No Shortness of breath?: Yes  Endocrine Excessive thirst?: No  Musculoskeletal Back pain?: Yes Joint pain?: Yes  Neurological Headaches?: No Dizziness?: No  Psychologic Depression?: Yes Anxiety?: No  Physical Exam: BP (!) 157/78   Pulse 61   Resp 16   Ht 5\' 5"  (1.651 m)   Wt 227 lb 8 oz (103.2 kg)   SpO2 98%   BMI 37.86 kg/m   Constitutional:  Alert and oriented, No acute distress. HEENT: [place] AT, moist mucus membranes.  Trachea midline, no masses. Cardiovascular: No clubbing, cyanosis, or edema. Respiratory: Normal respiratory effort, no increased work of breathing. GI: Abdomen is soft, nontender, nondistended, no abdominal masses GU: No CVA tenderness.  Pain is slightly retracted into suprapubic fat but not buried or trapped.  Prostate 40 g, smooth without  nodules Lymph: No cervical or inguinal lymphadenopathy. Skin: No rashes, bruises or suspicious lesions. Neurologic: Grossly intact, no focal deficits, moving all 4 extremities. Psychiatric: Normal mood and affect.  Laboratory Data: Lab Results  Component Value Date   WBC 10.3 11/15/2014   HGB 15.0 11/15/2014   HCT 44.0 11/15/2014   MCV 98.5 11/15/2014   PLT 203 11/15/2014    Lab Results  Component Value Date   CREATININE 1.62 (H) 11/15/2014   Urinalysis Dipstick: PH 5.5 Microscopy: Negative   Pertinent Imaging: Images were personally reviewed          Results for orders placed during the hospital encounter of 04/17/17  US RENAL   Narrative CLINICAL DATA:  Chronic kidney disease stage 3  EXAM: RENAL / URINARY TRACT ULTRASOUND COMPLETE  COMPARISON:  CT 09/03/2013  FINDINGS:  Right Kidney:  Length: 10.7 cm. 4.3 cm cyst in the lower pole, parapelvic region. Mild cortical thinning. No hydronephrosis. Normal echotexture.  Left Kidney:  Length: 11.9 cm. Mild cortical thinning. Normal echotexture. Probable nonobstructing stones, the largest 12 mm in the midpole. No hydronephrosis.  Bladder:  Appears normal for degree of bladder distention.  IMPRESSION: Bilateral renal cortical thinning.  No hydronephrosis.  Left nephrolithiasis.   Electronically Signed   By: Rolm Baptise M.D.   On: 04/18/2017 08:41     Assessment & Plan:   77 year old male with nonobstructing left renal calculi.  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  A KUB was ordered and if no definite calculi are seen he may have uric acid stones.  His urine pH was 5.5 and would recommend urinary alkalinization with a follow-up renal ultrasound.  If his calculi are visualized will follow with serial KUB.  He also has bothersome lower urinary tract symptoms.  We discussed the most common etiology would be overactive bladder secondary to prostate enlargement.  He was  interested in an initial trial of tamsulosin.  If not effective would consider a trial of Myrbetriq.  Return in about 6 months (around 11/20/2017) for Recheck, KUB.    Abbie Sons, [place] [address], [place], [place] [address] [phone]

## 2017-05-26 ENCOUNTER — Other Ambulatory Visit: Payer: Self-pay | Admitting: Urology

## 2017-05-28 ENCOUNTER — Telehealth: Payer: Self-pay | Admitting: Urology

## 2017-05-28 ENCOUNTER — Telehealth: Payer: Self-pay

## 2017-05-28 NOTE — Telephone Encounter (Signed)
Pt called office asking where is his Rx. Pt was told a Rx was sent to his pharmacy, pt has called 2 pharmacies and neither has "potassium" Rx for him. He would like it to be sent to the [place]. [facility]. Please Advise. Thanks.

## 2017-05-28 NOTE — Telephone Encounter (Signed)
I see the RX for Potassium was not sent to pharmacy. Can you please fill or let me know what you want ordered? He wants to use [facility] on [address]

## 2017-05-28 NOTE — Telephone Encounter (Signed)
Spoke with pt in reference to KUB results, Urocit K, and lab draw. Lab appt made. Also made pt aware of litholink. Litholink ordered. Pt voiced understanding.

## 2017-05-28 NOTE — Telephone Encounter (Signed)
-----   Message from Abbie Sons, MD sent at 05/26/2017 12:10 PM EDT ----- The larger stone was not visualized on KUB and may be a uric acid stone.  Recommend starting potassium citrate.  Rx was sent to his pharmacy.  Recommend a potassium level in 2-3 weeks.  Would also recommend a 24-hour urine study in approximately 6-8 weeks.

## 2017-05-29 NOTE — Telephone Encounter (Signed)
LMOM for patient to return call.

## 2017-05-29 NOTE — Telephone Encounter (Signed)
Since he is taking Spironolactone he will not be able to take potassium citrate.  Would recommend a follow-up appointment with his primary provider to see if this could possibly be discontinued or another medication substituted.

## 2017-06-03 NOTE — Telephone Encounter (Signed)
Called and spoke w/ pt, he states he will contact his pcp, but spironolactone has done wonders for him.

## 2017-06-18 ENCOUNTER — Other Ambulatory Visit: Payer: Self-pay

## 2017-06-18 ENCOUNTER — Encounter: Payer: Self-pay | Admitting: Urology

## 2017-06-18 DIAGNOSIS — N200 Calculus of kidney: Secondary | ICD-10-CM

## 2017-07-19 ENCOUNTER — Other Ambulatory Visit: Payer: Medicare HMO

## 2017-09-26 ENCOUNTER — Other Ambulatory Visit: Payer: Self-pay | Admitting: Urology

## 2017-09-26 ENCOUNTER — Telehealth: Payer: Self-pay | Admitting: Urology

## 2017-09-26 NOTE — Telephone Encounter (Signed)
Pt walked into office inquiring about his LithoLink results as he has not heard from anyone.  We had not gotten the results, Morey Hummingbird signed into the newly made account and was able to print off the results today.  Please call pt with results. The phone number in chart is a correct working number. Thank you.

## 2017-09-30 NOTE — Telephone Encounter (Signed)
24-hour urine study showed low urine volume at 1.6 L.  Would recommend increase water intake keep urine output greater than 2.5 L/day.  He also had low urinary citrate levels and a low urine pH.  The best option would be potassium citrate however he may not be able to take since he is on spironolactone.  Please find out the provider who has him on this medication.

## 2017-10-02 NOTE — Telephone Encounter (Signed)
Pt states he is monitored on Spironolactone by Evern Bio, NP at [facility].

## 2017-10-07 MED ORDER — SOD CITRATE-CITRIC ACID 500-334 MG/5ML PO SOLN: 30.0000 mL | Freq: Two times a day (BID) | ORAL | 2 refills | Status: DC

## 2017-10-07 NOTE — Telephone Encounter (Signed)
He will not be able to take potassium citrate due to the spironolactone.  This may cause elevated blood potassium levels.  I sent in a prescription for sodium citrate.  This is not available and pill form but is in a liquid.  Recommend repeating Litholink 24-hour urine only after he has been on this therapy for 3 months.

## 2017-10-07 NOTE — Addendum Note (Signed)
Addended by: Abbie Sons on: 10/07/2017 01:12 PM   Modules accepted: Orders

## 2017-10-08 ENCOUNTER — Other Ambulatory Visit: Payer: Self-pay

## 2017-10-08 MED ORDER — SOD CITRATE-CITRIC ACID 500-334 MG/5ML PO SOLN: 30.0000 mL | Freq: Two times a day (BID) | ORAL | 2 refills | Status: DC

## 2017-10-08 NOTE — Telephone Encounter (Signed)
Pt informed, he asked that this be sent to [facility]. I have also ordered litholink for 3 months.

## 2017-10-17 NOTE — Telephone Encounter (Signed)
Insurance approved Oracit medication. 02/10/2018-02/11/2018

## 2017-10-24 ENCOUNTER — Telehealth: Payer: Self-pay | Admitting: Urology

## 2017-10-24 DIAGNOSIS — N200 Calculus of kidney: Secondary | ICD-10-CM

## 2017-10-24 NOTE — Telephone Encounter (Signed)
You wanted this patient to have a KUB prior to their follow up app and there is not an order.    Bobby Lane

## 2017-11-12 ENCOUNTER — Emergency Department: Payer: Medicare HMO

## 2017-11-12 ENCOUNTER — Emergency Department
Admission: EM | Admit: 2017-11-12 | Discharge: 2017-11-12 | Disposition: A | Discharge status: Home / Self Care | Payer: Medicare HMO | Attending: Student in an Organized Health Care Education/Training Program | Admitting: Student in an Organized Health Care Education/Training Program

## 2017-11-12 DIAGNOSIS — J449 Chronic obstructive pulmonary disease, unspecified: Secondary | ICD-10-CM | POA: Diagnosis not present

## 2017-11-12 DIAGNOSIS — Y999 Unspecified external cause status: Secondary | ICD-10-CM | POA: Insufficient documentation

## 2017-11-12 DIAGNOSIS — S51011A Laceration without foreign body of right elbow, initial encounter: Secondary | ICD-10-CM | POA: Insufficient documentation

## 2017-11-12 DIAGNOSIS — E039 Hypothyroidism, unspecified: Secondary | ICD-10-CM | POA: Insufficient documentation

## 2017-11-12 DIAGNOSIS — M25511 Pain in right shoulder: Secondary | ICD-10-CM | POA: Diagnosis not present

## 2017-11-12 DIAGNOSIS — Z23 Encounter for immunization: Secondary | ICD-10-CM | POA: Insufficient documentation

## 2017-11-12 DIAGNOSIS — Y92481 Parking lot as the place of occurrence of the external cause: Secondary | ICD-10-CM | POA: Diagnosis not present

## 2017-11-12 DIAGNOSIS — W19XXXA Unspecified fall, initial encounter: Secondary | ICD-10-CM

## 2017-11-12 DIAGNOSIS — I10 Essential (primary) hypertension: Secondary | ICD-10-CM | POA: Diagnosis not present

## 2017-11-12 DIAGNOSIS — T148XXA Other injury of unspecified body region, initial encounter: Secondary | ICD-10-CM

## 2017-11-12 DIAGNOSIS — Y9301 Activity, walking, marching and hiking: Secondary | ICD-10-CM | POA: Insufficient documentation

## 2017-11-12 DIAGNOSIS — I2510 Atherosclerotic heart disease of native coronary artery without angina pectoris: Secondary | ICD-10-CM | POA: Insufficient documentation

## 2017-11-12 DIAGNOSIS — W01198A Fall on same level from slipping, tripping and stumbling with subsequent striking against other object, initial encounter: Secondary | ICD-10-CM | POA: Insufficient documentation

## 2017-11-12 DIAGNOSIS — Z79899 Other long term (current) drug therapy: Secondary | ICD-10-CM | POA: Diagnosis not present

## 2017-11-12 MED ORDER — ACETAMINOPHEN 500 MG PO TABS
1000.0000 mg | ORAL_TABLET | Freq: Once | ORAL | Status: DC
  Filled 2017-11-12: qty 2

## 2017-11-12 MED ORDER — LIDOCAINE 5 % EX PTCH: 1.0000 | MEDICATED_PATCH | Freq: Two times a day (BID) | CUTANEOUS | 0 refills | Status: AC

## 2017-11-12 MED ORDER — CLONIDINE HCL 0.1 MG PO TABS
0.1000 mg | ORAL_TABLET | Freq: Once | ORAL | Status: AC
Start: 2017-11-12 — End: 2017-11-12
  Administered 2017-11-12: 0.1 mg via ORAL
  Filled 2017-11-12: qty 1

## 2017-11-12 MED ORDER — LIDOCAINE 5 % EX PTCH
1.0000 | MEDICATED_PATCH | CUTANEOUS | Status: DC
  Administered 2017-11-12: 1 via TRANSDERMAL
  Filled 2017-11-12: qty 1

## 2017-11-12 MED ORDER — TETANUS-DIPHTH-ACELL PERTUSSIS 5-2.5-18.5 LF-MCG/0.5 IM SUSP
0.5000 mL | Freq: Once | INTRAMUSCULAR | Status: AC
  Administered 2017-11-12: 0.5 mL via INTRAMUSCULAR
  Filled 2017-11-12: qty 0.5

## 2017-11-12 NOTE — ED Notes (Signed)
Pts skin tear to R elbow wrapped. Pt given crackers/peanut butter/ginger ale. [place] per MD

## 2017-11-12 NOTE — ED Triage Notes (Addendum)
Pt BIB [facility] from restaurant for mechanical trip and fall in parking lot. Pt states he tripped over concrete barrier in parking spot and landed on R side. Pt co R shoulder pain 10/10 - pt can do active ROM and R arm is neurovascularly intact. Skin tear noted to right elbow - bleeding controlled. No LOC, no blood thinners. Pt denies neck pain and vision changes. Pt nauseated upon arrival and threw up a small amount. Pt alert & oriented x3. ABCs intact. NAD. Pt endorses taking antibiotic for leg wound .

## 2017-11-12 NOTE — ED Notes (Signed)
Pt currently eating. Pt and family at bedside aware we will try to ambulate in ~10 minutes.

## 2017-11-12 NOTE — ED Notes (Signed)
Pt to imaging. ABCs intact. NAD

## 2017-11-12 NOTE — ED Provider Notes (Signed)
[facility] Emergency Department Provider Note    First MD Initiated Contact with Patient 11/12/17 [phone]     (approximate)  I have reviewed the triage vital signs and the nursing notes.   HISTORY  Chief Complaint Fall    HPI Bobby Lane is a 77 y.o. male extensive past medical history presents to the ER for evaluation of right shoulder pain that occurred after mechanical fall.  He is leaving restaurant did not see concrete barrier next to parking spot.  Tripped landing on his right shoulder and elbow.  Complaining of  elbow and shoulder pain.  Denies hitting his head.  No LOC.  He is not on any blood thinners.  Very hard of hearing.   Past Medical History:  Diagnosis Date  . Arthritis   . Cancer ([place])    SKIN  . COPD (chronic obstructive pulmonary disease) ([place])   . Coronary artery disease   . Dyspnea    DOE  . GERD (gastroesophageal reflux disease)   . Gout   . Heart murmur   . History of hiatal hernia   . History of kidney stones   . History of wheezing   . HOH (hard of hearing)   . Hypertension   . Hypothyroidism   . Oxygen deficit    PRN USE  . Renal disorder   . Thyroid disease    Family History  Problem Relation Age of Onset  . Prostate cancer Neg Hx   . Bladder Cancer Neg Hx   . Kidney cancer Neg Hx    Past Surgical History:  Procedure Laterality Date  . CATARACT EXTRACTION W/PHACO Right 08/21/2016   Procedure: CATARACT EXTRACTION PHACO AND INTRAOCULAR LENS PLACEMENT (IOC);  Surgeon: Birder Robson, MD;  Location: [facility] ORS;  Service: Ophthalmology;  Laterality: Right;  [place] 00:54.9 AP% 18.3 CDE 10.04 Fluid pack lot # [id number]  . CATARACT EXTRACTION W/PHACO Left 09/18/2016   Procedure: CATARACT EXTRACTION PHACO AND INTRAOCULAR LENS PLACEMENT (IOC);  Surgeon: Birder Robson, MD;  Location: [facility] ORS;  Service: Ophthalmology;  Laterality: Left;  [place] 00:56 AP% 19.7 CDE 11.07 Fluid pack lot # [id number]  . FRACTURE SURGERY     ANKLE  . HERNIA REPAIR    . UPPER GI ENDOSCOPY     Patient Active Problem List   Diagnosis Date Noted  . Fatigue 06/05/2013  . Bradycardia 06/05/2013  . Hypertension   . Gout   . Thyroid disease   . Coronary artery disease   . Renal disorder       Prior to Admission medications   Medication Sig Start Date End Date Taking? Authorizing Provider  allopurinol (ZYLOPRIM) 300 MG tablet Take 300 mg by mouth daily.    [provider]  Cholecalciferol (VITAMIN D3) 5000 UNITS TABS Take 5,000 Units by mouth daily.    [provider]  cloNIDine (CATAPRES) 0.2 MG tablet Take 0.1 mg by mouth 2 (two) times daily.    [provider]  DULoxetine (CYMBALTA) 30 MG capsule Take 30 mg by mouth daily.    [provider]  levothyroxine (SYNTHROID, LEVOTHROID) 112 MCG tablet Take 112 mcg by mouth daily before breakfast.    [provider]  losartan (COZAAR) 100 MG tablet Take 100 mg by mouth daily.    [provider]  metoprolol (LOPRESSOR) 100 MG tablet Take 100 mg by mouth daily. Taking 50mg     [provider]  pantoprazole (PROTONIX) 40 MG tablet Take 40 mg by mouth  daily.    [provider]  sodium citrate-citric acid (ORACIT) 500-334 MG/5ML solution Take 30 mLs by mouth 2 (two) times daily after a meal. 10/08/17   Stoioff, Ronda Fairly, MD  spironolactone (ALDACTONE) 25 MG tablet Take 25 mg by mouth daily.    [provider]  tamsulosin (FLOMAX) 0.4 MG CAPS capsule Take 1 capsule (0.4 mg total) by mouth daily. 05/21/17   Stoioff, Ronda Fairly, MD    Allergies Codeine; Morphine and related; Novocain [procaine]; Other; Percocet [oxycodone-acetaminophen]; Sulfa antibiotics; and Tramadol    Social History Social History   Tobacco Use  . Smoking status: Never Smoker  . Smokeless tobacco: Never Used  Substance Use Topics  . Alcohol use: No  . Drug use: No    Review of Systems Patient denies headaches, rhinorrhea, blurry  vision, numbness, shortness of breath, chest pain, edema, cough, abdominal pain, nausea, vomiting, diarrhea, dysuria, fevers, rashes or hallucinations unless otherwise stated above in HPI. ____________________________________________   PHYSICAL EXAM:  VITAL SIGNS: Vitals:   11/12/17 1911 11/12/17 1912  BP:  (!) 183/90  Pulse:  (!) 47  Resp:  18  Temp:  98 F (36.7 C)  SpO2: 95% 95%    Constitutional: Alert and oriented.  Eyes: Conjunctivae are normal.  Head: Atraumatic. Nose: No congestion/rhinnorhea. Mouth/Throat: Mucous membranes are moist.   Neck: No stridor. Painless ROM.  Cardiovascular: Normal rate, regular rhythm. Grossly normal heart sounds.  Good peripheral circulation. Respiratory: Normal respiratory effort.  No retractions. Lungs CTAB. Gastrointestinal: Soft and nontender. No distention. No abdominal bruits. No CVA tenderness. Genitourinary:  Musculoskeletal: There is palpation along the right deltoid and right elbow.  No cervical spine tenderness palpation.  No step-offs or deformities.  Have chronic right lower extremity pain and arthritis chronic wound out evidence of worsening cellulitis.  No lower extremity tenderness nor edema.  No joint effusions. Neurologic:  Normal speech and language. No gross focal neurologic deficits are appreciated. No facial droop Skin:  Skin is warm, dry and intact. No rash noted. Psychiatric: Mood and affect are normal. Speech and behavior are normal.  ____________________________________________   LABS (all labs ordered are listed, but only abnormal results are displayed)  No results found for this or any previous visit (from the past 24 hour(s)). ____________________________________________  EKG My review and personal interpretation at Time: 19:23   Indication: fall  Rate: 70  Rhythm: sinus Axis: normal Other: normal intervals, no stemi, occasional PVC ____________________________________________  RADIOLOGY  I personally  reviewed all radiographic images ordered to evaluate for the above acute complaints and reviewed radiology reports and findings.  These findings were personally discussed with the patient.  Please see medical record for radiology report.  ____________________________________________   PROCEDURES  Procedure(s) performed:  Procedures    Critical Care performed: no ____________________________________________   INITIAL IMPRESSION / ASSESSMENT AND PLAN / ED COURSE  Pertinent labs & imaging results that were available during my care of the patient were reviewed by me and considered in my medical decision making (see chart for details).   DDX: Fracture dislocation, contusion, abrasion  Bobby Lane is a 77 y.o. who presents to the ED with symptoms as described above.  No evidence of head or neck trauma.  X-rays will be ordered of the right shoulder right elbow and chest to evaluate for above differential.  His abdominal exam is soft and benign.  No evidence of new hip injury.  Clinical Course as of Nov 13 2038  Tue Nov 12, 2017  2018 Displaced please evaluate the patient bedside.  Repeat abdominal exam soft and benign.  Does have some what appears to be dirt and gravel abrasion to right elbow.  Wound care provided.  No evidence of fracture.  Patient's pain is controlled.  Will ambulate and p.o. challenge as I anticipate he will be stable and appropriate for outpatient follow-up.   [PR]    Clinical Course User Index [PR] Merlyn Lot, MD     As part of my medical decision making, I reviewed the following data within the [place] notes reviewed and incorporated, Labs reviewed, notes from prior ED visits.   ____________________________________________   FINAL CLINICAL IMPRESSION(S) / ED DIAGNOSES  Final diagnoses:  Fall, initial encounter  Abrasion  Skin tear of right elbow without complication, initial encounter      NEW MEDICATIONS STARTED  DURING THIS VISIT:  New Prescriptions   No medications on file     Note:  This document was prepared using Dragon voice recognition software and may include unintentional dictation errors.    Merlyn Lot, MD 11/12/17 2040

## 2017-11-12 NOTE — ED Notes (Signed)
Pt ambulatory with steady gait with use of cane. Pt used restroom. Pt feels able to go home.

## 2017-11-12 NOTE — ED Notes (Signed)
Pt returned from imaging. ABCs intact. NAD

## 2017-11-19 ENCOUNTER — Ambulatory Visit: Payer: Medicare HMO | Admitting: Urology

## 2017-12-02 ENCOUNTER — Ambulatory Visit: Payer: Medicare HMO | Admitting: Urology

## 2018-01-14 ENCOUNTER — Other Ambulatory Visit: Payer: Medicare HMO

## 2018-04-01 ENCOUNTER — Emergency Department: Payer: Medicare HMO

## 2018-04-01 ENCOUNTER — Emergency Department
Admission: EM | Admit: 2018-04-01 | Discharge: 2018-04-02 | Disposition: A | Discharge status: Home / Self Care | Payer: Medicare HMO | Attending: Emergency Medicine | Admitting: Emergency Medicine

## 2018-04-01 ENCOUNTER — Other Ambulatory Visit: Payer: Self-pay

## 2018-04-01 DIAGNOSIS — W0110XA Fall on same level from slipping, tripping and stumbling with subsequent striking against unspecified object, initial encounter: Secondary | ICD-10-CM | POA: Diagnosis not present

## 2018-04-01 DIAGNOSIS — W19XXXA Unspecified fall, initial encounter: Secondary | ICD-10-CM

## 2018-04-01 DIAGNOSIS — J449 Chronic obstructive pulmonary disease, unspecified: Secondary | ICD-10-CM | POA: Insufficient documentation

## 2018-04-01 DIAGNOSIS — Y939 Activity, unspecified: Secondary | ICD-10-CM | POA: Insufficient documentation

## 2018-04-01 DIAGNOSIS — Z85828 Personal history of other malignant neoplasm of skin: Secondary | ICD-10-CM | POA: Diagnosis not present

## 2018-04-01 DIAGNOSIS — Y999 Unspecified external cause status: Secondary | ICD-10-CM | POA: Diagnosis not present

## 2018-04-01 DIAGNOSIS — Z79899 Other long term (current) drug therapy: Secondary | ICD-10-CM | POA: Insufficient documentation

## 2018-04-01 DIAGNOSIS — I10 Essential (primary) hypertension: Secondary | ICD-10-CM | POA: Insufficient documentation

## 2018-04-01 DIAGNOSIS — Y92009 Unspecified place in unspecified non-institutional (private) residence as the place of occurrence of the external cause: Secondary | ICD-10-CM | POA: Diagnosis not present

## 2018-04-01 DIAGNOSIS — S060X0A Concussion without loss of consciousness, initial encounter: Secondary | ICD-10-CM | POA: Diagnosis not present

## 2018-04-01 DIAGNOSIS — S0101XA Laceration without foreign body of scalp, initial encounter: Secondary | ICD-10-CM | POA: Diagnosis not present

## 2018-04-01 DIAGNOSIS — I2510 Atherosclerotic heart disease of native coronary artery without angina pectoris: Secondary | ICD-10-CM | POA: Insufficient documentation

## 2018-04-01 DIAGNOSIS — S0990XA Unspecified injury of head, initial encounter: Secondary | ICD-10-CM | POA: Insufficient documentation

## 2018-04-01 DIAGNOSIS — E039 Hypothyroidism, unspecified: Secondary | ICD-10-CM | POA: Insufficient documentation

## 2018-04-01 LAB — CBC
HCT: 43.7 % (ref 39.0–52.0)
Hemoglobin: 14.4 g/dL (ref 13.0–17.0)
MCH: 33.6 pg (ref 26.0–34.0)
MCHC: 33 g/dL (ref 30.0–36.0)
MCV: 101.9 fL — ABNORMAL HIGH (ref 80.0–100.0)
PLATELETS: 238 10*3/uL (ref 150–400)
RBC: 4.29 MIL/uL (ref 4.22–5.81)
RDW: 13.1 % (ref 11.5–15.5)
WBC: 7.1 10*3/uL (ref 4.0–10.5)
nRBC: 0 % (ref 0.0–0.2)

## 2018-04-01 MED ORDER — ONDANSETRON HCL 4 MG/2ML IJ SOLN
INTRAMUSCULAR | Status: AC
  Filled 2018-04-01: qty 2

## 2018-04-01 NOTE — ED Provider Notes (Signed)
[facility] Emergency Department Provider Note  Time seen: 11:44 PM  I have reviewed the triage vital signs and the nursing notes.   HISTORY  Chief Complaint Fall   HPI Bobby Lane is a 78 y.o. male with a past medical history of arthritis, COPD, CAD, gastric reflux, hypertension, presents to the emergency department after a fall with a head injury.  According to EMS patient was at home when he had a fall falling backwards hitting the back of his head.  Unknown LOC.  Wife states she heard the fall but did not witness it.  Patient was awake when she got to him but she was unable to get the patient up.  EMS arrived patient was complaining somewhat of some right hip pain, has a laceration to the back of the scalp.  Patient appears to be somewhat slow in answering questions and has some difficulty with history surrounding the fall.  Cannot give a good history of what happened to cause him to fall, or if he passed out.  Does state some nausea but no known vomiting.  Patient denies any pain of any kind currently although does have a laceration to occipital scalp and has pain with right hip range of motion.   Past Medical History:  Diagnosis Date  . Arthritis   . Cancer ([place])    SKIN  . COPD (chronic obstructive pulmonary disease) ([place])   . Coronary artery disease   . Dyspnea    DOE  . GERD (gastroesophageal reflux disease)   . Gout   . Heart murmur   . History of hiatal hernia   . History of kidney stones   . History of wheezing   . HOH (hard of hearing)   . Hypertension   . Hypothyroidism   . Oxygen deficit    PRN USE  . Renal disorder   . Thyroid disease     Patient Active Problem List   Diagnosis Date Noted  . Fatigue 06/05/2013  . Bradycardia 06/05/2013  . Hypertension   . Gout   . Thyroid disease   . Coronary artery disease   . Renal disorder     Past Surgical History:  Procedure Laterality Date  . CATARACT EXTRACTION W/PHACO Right  08/21/2016   Procedure: CATARACT EXTRACTION PHACO AND INTRAOCULAR LENS PLACEMENT (IOC);  Surgeon: Birder Robson, MD;  Location: [facility] ORS;  Service: Ophthalmology;  Laterality: Right;  [place] 00:54.9 AP% 18.3 CDE 10.04 Fluid pack lot # [id number]  . CATARACT EXTRACTION W/PHACO Left 09/18/2016   Procedure: CATARACT EXTRACTION PHACO AND INTRAOCULAR LENS PLACEMENT (IOC);  Surgeon: Birder Robson, MD;  Location: [facility] ORS;  Service: Ophthalmology;  Laterality: Left;  [place] 00:56 AP% 19.7 CDE 11.07 Fluid pack lot # [id number]  . FRACTURE SURGERY     ANKLE  . HERNIA REPAIR    . UPPER GI ENDOSCOPY      Prior to Admission medications   Medication Sig Start Date End Date Taking? Authorizing Provider  allopurinol (ZYLOPRIM) 300 MG tablet Take 300 mg by mouth daily.    [provider]  Cholecalciferol (VITAMIN D3) 5000 UNITS TABS Take 5,000 Units by mouth daily.    [provider]  cloNIDine (CATAPRES) 0.2 MG tablet Take 0.1 mg by mouth 2 (two) times daily.    [provider]  DULoxetine (CYMBALTA) 30 MG capsule Take 30 mg by mouth daily.    [provider]  levothyroxine (SYNTHROID, LEVOTHROID) 112 MCG tablet Take 112 mcg by mouth  daily before breakfast.    [provider]  lidocaine (LIDODERM) 5 % Place 1 patch onto the skin every 12 (twelve) hours. Remove & Discard patch within 12 hours or as directed by MD 11/12/17 11/12/18  Merlyn Lot, MD  losartan (COZAAR) 100 MG tablet Take 100 mg by mouth daily.    [provider]  metoprolol (LOPRESSOR) 100 MG tablet Take 100 mg by mouth daily. Taking 50mg     [provider]  pantoprazole (PROTONIX) 40 MG tablet Take 40 mg by mouth daily.    [provider]  sodium citrate-citric acid (ORACIT) 500-334 MG/5ML solution Take 30 mLs by mouth 2 (two) times daily after a meal. 10/08/17   Stoioff, Ronda Fairly, MD  spironolactone (ALDACTONE) 25 MG tablet Take 25 mg by mouth daily.    [provider]  tamsulosin (FLOMAX) 0.4 MG CAPS capsule Take 1 capsule (0.4 mg total) by mouth daily. 05/21/17   Stoioff, Ronda Fairly, MD    Allergies  Allergen Reactions  . Codeine Nausea And Vomiting  . Morphine And Related Nausea And Vomiting  . Novocain [Procaine] Hives    NO TROUBLE IN OR  . Other Nausea And Vomiting  . Percocet [Oxycodone-Acetaminophen] Nausea And Vomiting  . Sulfa Antibiotics Hives  . Tramadol Nausea And Vomiting    Note: upset stomach Note: upset stomach     Family History  Problem Relation Age of Onset  . Prostate cancer Neg Hx   . Bladder Cancer Neg Hx   . Kidney cancer Neg Hx     Social History Social History   Tobacco Use  . Smoking status: Never Smoker  . Smokeless tobacco: Never Used  Substance Use Topics  . Alcohol use: No  . Drug use: No    Review of Systems Constitutional: Unknown loss of consciousness Cardiovascular: Negative for chest pain. Respiratory: Negative for shortness of breath. Gastrointestinal: Negative for abdominal pain Musculoskeletal: Right hip pain Skin: Negative for skin complaints  Neurological: Negative for headache All other ROS negative  ____________________________________________   PHYSICAL EXAM:  VITAL SIGNS: ED Triage Vitals  Enc Vitals Group     BP 04/01/18 2339 (!) 208/114     Pulse Rate 04/01/18 2339 83     Resp 04/01/18 2339 14     Temp --      Temp src --      SpO2 --      Weight 04/01/18 2340 227 lb 1.2 oz (103 kg)     Height 04/01/18 2340 5\' 8"  (1.727 m)     Head Circumference --      Peak Flow --      Pain Score 04/01/18 2340 9     Pain Loc --      Pain Edu? --      Excl. in [place]? --    Constitutional: Patient is awake and alert, he does appear to be somewhat confused at times. Eyes: Normal exam ENT   Head: Patient appears to have abrasion versus laceration to occipital scalp with mild tenderness to the area.  Dried blood to the area.   Mouth/Throat: Mucous membranes are  moist. Cardiovascular: Normal rate, regular rhythm.  Respiratory: Normal respiratory effort without tachypnea nor retractions. Breath sounds are clear Gastrointestinal: Soft and nontender. No distention.   Musculoskeletal: Mild right hip pain with range of motion.  Neurovascular intact distally. Neurologic:  Normal speech and language. No gross focal neurologic deficits Skin:  Skin is warm, dry and intact.  Psychiatric:  Mood and affect are normal.  ____________________________________________    EKG  EKG viewed and interpreted by myself shows a normal sinus rhythm 80 bpm with a narrow QRS, normal axis, normal intervals, nonspecific ST changes.  ____________________________________________    RADIOLOGY  CT scan of the head and neck are negative. Right hip x-ray shows arthritis but no fracture.  ____________________________________________   INITIAL IMPRESSION / ASSESSMENT AND PLAN / ED COURSE  Pertinent labs & imaging results that were available during my care of the patient were reviewed by me and considered in my medical decision making (see chart for details).  Patient presents to the emergency department after a fall at home.  Some perseveration some confusion unclear of the patient's baseline at this time awaiting family arrival for further details.  Differential this time would include concussion versus ICH.  Mild right hip pain with range of motion we will obtain x-ray imaging of the right hip and pelvis as well to rule out fracture.  We will check basic labs and continue to closely monitor in the emergency department.  Patient did have nausea and vomiting while getting imaging.  Was given nausea medication.  States he feels much better at this time.  Patient is also much more clear at this time.  Able to give me much better history, states he went to the back of dog food over shoulder to bring inside when he fell backwards hitting the back of his head.  CT scans are negative  for acute abnormality.  Patient did have an approximate 2 cm laceration to the back of the scalp which has been repaired with 3 staples by myself.  Highly suspect concussion.  Given the patient is nausea and vomiting in the emergency department we will continue to closely monitor.  We will ambulate the patient.  As long as the patient continues to improve clinically and appears well anticipate likely discharge home in the morning.  Patient states he feels much better.  He is asking to be discharged home.  Patient's son is now here with the patient.  Patient has been able to stand, ambulate with assistance usually uses a cane for assistance at home.  Patient states he feels well and wishes to be discharged home.  I discussed return precautions with the patient as well as the son.  They are agreeable to plan of care.   LACERATION REPAIR Performed by: Harvest Dark Authorized by: Harvest Dark Consent: Verbal consent obtained. Risks and benefits: risks, benefits and alternatives were discussed Consent given by: patient Patient identity confirmed: provided demographic data Prepped and Draped in normal sterile fashion Wound explored  Laceration Location: occipital scalp  Laceration Length: 2cm  No Foreign Bodies seen or palpated  Anesthesia: local infiltration  Local anesthetic: LET  Anesthetic total: 3 ml  Amount of cleaning: standard  Skin closure: staples  Number of staples: 3  Technique: staples  Patient tolerance: Patient tolerated the procedure well with no immediate complications.  ____________________________________________   FINAL CLINICAL IMPRESSION(S) / ED DIAGNOSES  Fall Head injury Concussion laceration   Harvest Dark, MD 04/02/18 [phone]

## 2018-04-02 LAB — BASIC METABOLIC PANEL
ANION GAP: 9 (ref 5–15)
BUN: 28 mg/dL — ABNORMAL HIGH (ref 8–23)
CO2: 26 mmol/L (ref 22–32)
Calcium: 10 mg/dL (ref 8.9–10.3)
Chloride: 103 mmol/L (ref 98–111)
Creatinine, Ser: 1.32 mg/dL — ABNORMAL HIGH (ref 0.61–1.24)
GFR calc Af Amer: >60 mL/min (ref >60)
GFR, EST NON AFRICAN AMERICAN: 52 mL/min — AB (ref >60)
Glucose, Bld: 108 mg/dL — ABNORMAL HIGH (ref 70–99)
POTASSIUM: 4.1 mmol/L (ref 3.5–5.1)
SODIUM: 138 mmol/L (ref 135–145)

## 2018-04-02 MED ORDER — ONDANSETRON HCL 4 MG/2ML IJ SOLN
4.0000 mg | Freq: Once | INTRAMUSCULAR | Status: AC
  Administered 2018-04-02: 4 mg via INTRAVENOUS

## 2018-04-02 MED ORDER — ONDANSETRON HCL 4 MG/2ML IJ SOLN
INTRAMUSCULAR | Status: AC
  Filled 2018-04-02: qty 2

## 2018-04-02 NOTE — Discharge Instructions (Addendum)
And to the emergency department for any worsening headache, vomiting, weakness or numbness, or any other symptom personally concerning to yourself.  Otherwise please use Tylenol or ibuprofen every 6 hours as needed for headache or discomfort.  You suffered a laceration to the back of your scalp.  Please keep this area covered with Neosporin over the next 10 days.  Please follow-up with your doctor in 10 days for staple removal.

## 2018-04-02 NOTE — ED Notes (Signed)
Patient bed and incontinent brief changed. Warm blankets covered patient. Patient still is stating he don't know what happened.

## 2018-04-02 NOTE — ED Notes (Signed)
Patient able to walk in room. Patient tolerated well.

## 2018-04-02 NOTE — ED Notes (Signed)
Notified that pt is laying in the hallway on the stretcher vomiting on his way to CT.MD notified and order received. This RN to radiology to give zofran.

## 2018-04-10 ENCOUNTER — Emergency Department
Admission: EM | Admit: 2018-04-10 | Discharge: 2018-04-10 | Disposition: A | Discharge status: Home / Self Care | Payer: Medicare HMO | Attending: Student in an Organized Health Care Education/Training Program | Admitting: Student in an Organized Health Care Education/Training Program

## 2018-04-10 ENCOUNTER — Encounter: Payer: Self-pay | Admitting: Emergency Medicine

## 2018-04-10 ENCOUNTER — Other Ambulatory Visit: Payer: Self-pay

## 2018-04-10 DIAGNOSIS — X58XXXD Exposure to other specified factors, subsequent encounter: Secondary | ICD-10-CM | POA: Diagnosis not present

## 2018-04-10 DIAGNOSIS — Z79899 Other long term (current) drug therapy: Secondary | ICD-10-CM | POA: Diagnosis not present

## 2018-04-10 DIAGNOSIS — Z85828 Personal history of other malignant neoplasm of skin: Secondary | ICD-10-CM | POA: Diagnosis not present

## 2018-04-10 DIAGNOSIS — J449 Chronic obstructive pulmonary disease, unspecified: Secondary | ICD-10-CM | POA: Insufficient documentation

## 2018-04-10 DIAGNOSIS — Z4802 Encounter for removal of sutures: Secondary | ICD-10-CM

## 2018-04-10 DIAGNOSIS — S0101XD Laceration without foreign body of scalp, subsequent encounter: Secondary | ICD-10-CM | POA: Insufficient documentation

## 2018-04-10 DIAGNOSIS — I10 Essential (primary) hypertension: Secondary | ICD-10-CM | POA: Diagnosis not present

## 2018-04-10 DIAGNOSIS — E039 Hypothyroidism, unspecified: Secondary | ICD-10-CM | POA: Insufficient documentation

## 2018-04-10 NOTE — ED Triage Notes (Signed)
States he his here for staple removal   Denies any other complaints

## 2018-04-10 NOTE — ED Provider Notes (Signed)
[facility] Emergency Department Provider Note   ____________________________________________   First MD Initiated Contact with Patient 04/10/18 1333     (approximate)  I have reviewed the triage vital signs and the nursing notes.   HISTORY  Chief Complaint Suture / Staple Removal    HPI Bobby Lane is a 78 y.o. male   patient presents for staple removal secondary to a laceration 8 days ago.  Patient voices no complaints.   Past Medical History:  Diagnosis Date  . Arthritis   . Cancer ([place])    SKIN  . COPD (chronic obstructive pulmonary disease) ([place])   . Coronary artery disease   . Dyspnea    DOE  . GERD (gastroesophageal reflux disease)   . Gout   . Heart murmur   . History of hiatal hernia   . History of kidney stones   . History of wheezing   . HOH (hard of hearing)   . Hypertension   . Hypothyroidism   . Oxygen deficit    PRN USE  . Renal disorder   . Thyroid disease     Patient Active Problem List   Diagnosis Date Noted  . Fatigue 06/05/2013  . Bradycardia 06/05/2013  . Hypertension   . Gout   . Thyroid disease   . Coronary artery disease   . Renal disorder     Past Surgical History:  Procedure Laterality Date  . CATARACT EXTRACTION W/PHACO Right 08/21/2016   Procedure: CATARACT EXTRACTION PHACO AND INTRAOCULAR LENS PLACEMENT (IOC);  Surgeon: Birder Robson, MD;  Location: [facility] ORS;  Service: Ophthalmology;  Laterality: Right;  [place] 00:54.9 AP% 18.3 CDE 10.04 Fluid pack lot # [id number]  . CATARACT EXTRACTION W/PHACO Left 09/18/2016   Procedure: CATARACT EXTRACTION PHACO AND INTRAOCULAR LENS PLACEMENT (IOC);  Surgeon: Birder Robson, MD;  Location: [facility] ORS;  Service: Ophthalmology;  Laterality: Left;  [place] 00:56 AP% 19.7 CDE 11.07 Fluid pack lot # [id number] H  . FRACTURE SURGERY     ANKLE  . HERNIA REPAIR    . UPPER GI ENDOSCOPY      Prior to Admission medications   Medication Sig Start Date End Date Taking?  Authorizing Provider  allopurinol (ZYLOPRIM) 300 MG tablet Take 300 mg by mouth daily.    [provider]  Cholecalciferol (VITAMIN D3) 5000 UNITS TABS Take 5,000 Units by mouth daily.    [provider]  cloNIDine (CATAPRES) 0.2 MG tablet Take 0.1 mg by mouth 2 (two) times daily.    [provider]  DULoxetine (CYMBALTA) 30 MG capsule Take 30 mg by mouth daily.    [provider]  levothyroxine (SYNTHROID, LEVOTHROID) 112 MCG tablet Take 112 mcg by mouth daily before breakfast.    [provider]  lidocaine (LIDODERM) 5 % Place 1 patch onto the skin every 12 (twelve) hours. Remove & Discard patch within 12 hours or as directed by MD 11/12/17 11/12/18  Merlyn Lot, MD  losartan (COZAAR) 100 MG tablet Take 100 mg by mouth daily.    [provider]  metoprolol (LOPRESSOR) 100 MG tablet Take 100 mg by mouth daily. Taking 50mg     [provider]  pantoprazole (PROTONIX) 40 MG tablet Take 40 mg by mouth daily.    [provider]  sodium citrate-citric acid (ORACIT) 500-334 MG/5ML solution Take 30 mLs by mouth 2 (two) times daily after a meal. 10/08/17   Stoioff, Ronda Fairly, MD  spironolactone (ALDACTONE) 25 MG tablet Take 25 mg by  mouth daily.    [provider]  tamsulosin (FLOMAX) 0.4 MG CAPS capsule Take 1 capsule (0.4 mg total) by mouth daily. 05/21/17   Stoioff, Ronda Fairly, MD    Allergies Codeine; Morphine and related; Novocain [procaine]; Other; Percocet [oxycodone-acetaminophen]; Sulfa antibiotics; and Tramadol  Family History  Problem Relation Age of Onset  . Prostate cancer Neg Hx   . Bladder Cancer Neg Hx   . Kidney cancer Neg Hx     Social History Social History   Tobacco Use  . Smoking status: Never Smoker  . Smokeless tobacco: Never Used  Substance Use Topics  . Alcohol use: No  . Drug use: No    Review of Systems Constitutional: No fever/chills Eyes: No visual changes. ENT: No sore  throat. Cardiovascular: Denies chest pain. Respiratory: Denies shortness of breath. Gastrointestinal: No abdominal pain.  No nausea, no vomiting.  No diarrhea.  No constipation. Genitourinary: Negative for dysuria. Musculoskeletal: Negative for back pain. Skin: Negative for rash.  Scalp laceration Neurological: Negative for headaches, focal weakness or numbness. Endocrine:  Gallop, hypertension, hypothyroidism, and renal disorder. Allergic/Immunilogical: See allergy list. ____________________________________________   PHYSICAL EXAM:  VITAL SIGNS: ED Triage Vitals  Enc Vitals Group     BP 04/10/18 1351 (!) 161/67     Pulse Rate 04/10/18 1351 74     Resp 04/10/18 1351 20     Temp 04/10/18 1351 97.7 F (36.5 C)     Temp Source 04/10/18 1351 Oral     SpO2 04/10/18 1351 100 %     Weight 04/10/18 1352 222 lb (100.7 kg)     Height 04/10/18 1352 5\' 6"  (1.676 m)     Head Circumference --      Peak Flow --      Pain Score --      Pain Loc --      Pain Edu? --      Excl. in [place]? --     Constitutional: Alert and oriented. Well appearing and in no acute distress. Head: Atraumatic.   Hematological/Lymphatic/Immunilogical: No cervical lymphadenopathy. Cardiovascular: Normal rate, regular rhythm. Grossly normal heart sounds.  Good peripheral circulation.  Elevated blood pressure Respiratory: Normal respiratory effort.  No retractions. Lungs CTAB. Gastrointestinal: Soft and nontender. No distention. No abdominal bruits. No CVA tenderness. Musculoskeletal: No lower extremity tenderness nor edema.  No joint effusions. Neurologic:  Normal speech and language. No gross focal neurologic deficits are appreciated. No gait instability. Skin: Healed laceration superior aspect of scalp with 3 staples.. No rash noted. Psychiatric: Mood and affect are normal. Speech and behavior are normal.  ____________________________________________   LABS (all labs ordered are listed, but only abnormal results  are displayed)  Labs Reviewed - No data to display ____________________________________________  EKG   ____________________________________________  RADIOLOGY  ED MD interpretation:    Official radiology report(s): No results found.  ____________________________________________   PROCEDURES  Procedure(s) performed (including Critical Care):  .Suture Removal Date/Time: 04/10/2018 2:19 PM Performed by: Sable Feil, PA-C Authorized by: Sable Feil, PA-C   Consent:    Consent obtained:  Verbal   Consent given by:  Patient   Risks discussed:  Bleeding, pain and wound separation Location:    Location:  Head/neck   Head/neck location:  Scalp Procedure details:    Wound appearance:  No signs of infection, good wound healing and clean   Number of sutures removed:  3 Post-procedure details:    Post-removal:  No dressing applied   Patient tolerance  of procedure:  Tolerated well, no immediate complications     ____________________________________________   INITIAL IMPRESSION / ASSESSMENT AND PLAN / ED COURSE  As part of my medical decision making, I reviewed the following data within the [place]     Patient presents with staple removal secondary to laceration 8 days ago.  Area was prepped with 3 staples removed.  Patient given discharge care instructions.      ____________________________________________   FINAL CLINICAL IMPRESSION(S) / ED DIAGNOSES  Final diagnoses:  Encounter for removal of staples     ED Discharge Orders    None       Note:  This document was prepared using Dragon voice recognition software and may include unintentional dictation errors.    Sable Feil, PA-C 04/10/18 1420    Merlyn Lot, MD 04/10/18 [phone]

## 2018-08-18 ENCOUNTER — Other Ambulatory Visit: Payer: Self-pay

## 2018-08-18 ENCOUNTER — Encounter: Payer: Self-pay | Admitting: Podiatry

## 2018-08-18 ENCOUNTER — Ambulatory Visit: Payer: Medicare HMO | Admitting: Podiatry

## 2018-08-18 DIAGNOSIS — M79675 Pain in left toe(s): Secondary | ICD-10-CM | POA: Diagnosis not present

## 2018-08-18 DIAGNOSIS — M79674 Pain in right toe(s): Secondary | ICD-10-CM

## 2018-08-18 DIAGNOSIS — B351 Tinea unguium: Secondary | ICD-10-CM

## 2018-08-18 NOTE — Progress Notes (Signed)
Complaint:  Visit Type: Patient presents  to my office for  preventative foot care services. Complaint: Patient states" my nails have grown long and thick and become painful to walk and wear shoes" . The patient presents for preventative foot care services. No changes to ROS  Podiatric Exam: Vascular: dorsalis pedis  are palpable bilateral. Posterior tibial pulses are not palpable  B/L. Capillary return is immediate. Temperature gradient is WNL. Skin turgor WNL  Sensorium: Normal Semmes Weinstein monofilament test. Normal tactile sensation bilaterally. Nail Exam: Pt has thick disfigured discolored nails with subungual debris noted bilateral entire nail hallux through fifth toenails Ulcer Exam: There is no evidence of ulcer or pre-ulcerative changes or infection. Orthopedic Exam: Muscle tone and strength are WNL. No limitations in general ROM. No crepitus or effusions noted. Foot type and digits show no abnormalities. Bony prominences are unremarkable. Skin: No Porokeratosis. No infection or ulcers  Diagnosis:  Onychomycosis, , Pain in right toe, pain in left toes  Treatment & Plan Procedures and Treatment: Consent by patient was obtained for treatment procedures.   Debridement of mycotic and hypertrophic toenails, 1 through 5 bilateral and clearing of subungual debris. No ulceration, no infection noted.  Return Visit-Office Procedure: Patient instructed to return to the office for a follow up visit 3 months for continued evaluation and treatment.    Gardiner Barefoot DPM

## 2018-11-13 ENCOUNTER — Other Ambulatory Visit: Payer: Self-pay

## 2018-11-13 ENCOUNTER — Encounter: Payer: Self-pay | Admitting: Emergency Medicine

## 2018-11-13 ENCOUNTER — Emergency Department: Payer: Medicare HMO

## 2018-11-13 ENCOUNTER — Emergency Department
Admission: EM | Admit: 2018-11-13 | Discharge: 2018-11-14 | Disposition: A | Discharge status: Home / Self Care | Payer: Medicare HMO | Attending: Emergency Medicine | Admitting: Emergency Medicine

## 2018-11-13 DIAGNOSIS — E039 Hypothyroidism, unspecified: Secondary | ICD-10-CM | POA: Insufficient documentation

## 2018-11-13 DIAGNOSIS — Z20828 Contact with and (suspected) exposure to other viral communicable diseases: Secondary | ICD-10-CM | POA: Insufficient documentation

## 2018-11-13 DIAGNOSIS — Z79899 Other long term (current) drug therapy: Secondary | ICD-10-CM | POA: Insufficient documentation

## 2018-11-13 DIAGNOSIS — R32 Unspecified urinary incontinence: Secondary | ICD-10-CM | POA: Diagnosis present

## 2018-11-13 DIAGNOSIS — J449 Chronic obstructive pulmonary disease, unspecified: Secondary | ICD-10-CM | POA: Insufficient documentation

## 2018-11-13 DIAGNOSIS — I10 Essential (primary) hypertension: Secondary | ICD-10-CM | POA: Diagnosis not present

## 2018-11-13 DIAGNOSIS — N3000 Acute cystitis without hematuria: Secondary | ICD-10-CM | POA: Insufficient documentation

## 2018-11-13 DIAGNOSIS — I2510 Atherosclerotic heart disease of native coronary artery without angina pectoris: Secondary | ICD-10-CM | POA: Diagnosis not present

## 2018-11-13 LAB — CBC
HCT: 37.6 % — ABNORMAL LOW (ref 39.0–52.0)
Hemoglobin: 12.5 g/dL — ABNORMAL LOW (ref 13.0–17.0)
MCH: 34.2 pg — ABNORMAL HIGH (ref 26.0–34.0)
MCHC: 33.2 g/dL (ref 30.0–36.0)
MCV: 102.7 fL — ABNORMAL HIGH (ref 80.0–100.0)
Platelets: 230 10*3/uL (ref 150–400)
RBC: 3.66 MIL/uL — ABNORMAL LOW (ref 4.22–5.81)
RDW: 14.4 % (ref 11.5–15.5)
WBC: 10.1 10*3/uL (ref 4.0–10.5)
nRBC: 0 % (ref 0.0–0.2)

## 2018-11-13 LAB — COMPREHENSIVE METABOLIC PANEL WITH GFR
ALT: 18 U/L (ref 0–44)
AST: 20 U/L (ref 15–41)
Albumin: 3.8 g/dL (ref 3.5–5.0)
Alkaline Phosphatase: 41 U/L (ref 38–126)
Anion gap: 13 (ref 5–15)
BUN: 53 mg/dL — ABNORMAL HIGH (ref 8–23)
CO2: 23 mmol/L (ref 22–32)
Calcium: 9.6 mg/dL (ref 8.9–10.3)
Chloride: 99 mmol/L (ref 98–111)
Creatinine, Ser: 1.45 mg/dL — ABNORMAL HIGH (ref 0.61–1.24)
GFR calc Af Amer: 54 mL/min — ABNORMAL LOW
GFR calc non Af Amer: 46 mL/min — ABNORMAL LOW
Glucose, Bld: 113 mg/dL — ABNORMAL HIGH (ref 70–99)
Potassium: 4 mmol/L (ref 3.5–5.1)
Sodium: 135 mmol/L (ref 135–145)
Total Bilirubin: 1.2 mg/dL (ref 0.3–1.2)
Total Protein: 7.7 g/dL (ref 6.5–8.1)

## 2018-11-13 LAB — PROTIME-INR
INR: 1.2 (ref 0.8–1.2)
Prothrombin Time: 14.8 seconds (ref 11.4–15.2)

## 2018-11-13 LAB — APTT: aPTT: 35 s (ref 24–36)

## 2018-11-13 LAB — LACTIC ACID, PLASMA: Lactic Acid, Venous: 1.8 mmol/L (ref 0.5–1.9)

## 2018-11-13 MED ORDER — SODIUM CHLORIDE 0.9 % IV SOLN
1.0000 g | INTRAVENOUS | Status: DC
  Administered 2018-11-13: 23:00:00 1 g via INTRAVENOUS
  Filled 2018-11-13: qty 10

## 2018-11-13 NOTE — Progress Notes (Signed)
CODE SEPSIS - PHARMACY COMMUNICATION  **Broad Spectrum Antibiotics should be administered within 1 hour of Sepsis diagnosis**  Time Code Sepsis Called/Page Received: 2311  Antibiotics Ordered: Rocephin 1gm  Time of 1st antibiotic administration: 2318  Additional action taken by pharmacy: n/a  If necessary, Name of Provider/Nurse Contacted: n/a   Ena Dawley ,PharmD Clinical Pharmacist  11/13/2018  11:11 PM

## 2018-11-13 NOTE — ED Provider Notes (Signed)
[facility] Emergency Department Provider Note   First MD Initiated Contact with Patient 11/13/18 2302     (approximate)  I have reviewed the triage vital signs and the nursing notes.   HISTORY  Chief Complaint Urinary Incontinence    HPI Bobby Lane is a 77 y.o. male with below list of previous medical conditions presents to the emergency department via EMS secondary to fever 100.6 and urinary incontinence.  Patient states that fevers been for the past couple of days urinary continence the patient states been occurring for approximately a month.  Patient does admit to occasional cough.  Patient denies any vomiting or diarrhea.  Patient denies any known sick contact.       Past Medical History:  Diagnosis Date  . Arthritis   . Cancer ([place])    SKIN  . COPD (chronic obstructive pulmonary disease) ([place])   . Coronary artery disease   . Dyspnea    DOE  . GERD (gastroesophageal reflux disease)   . Gout   . Heart murmur   . History of hiatal hernia   . History of kidney stones   . History of wheezing   . HOH (hard of hearing)   . Hypertension   . Hypothyroidism   . Oxygen deficit    PRN USE  . Renal disorder   . Thyroid disease     Patient Active Problem List   Diagnosis Date Noted  . Pain due to onychomycosis of toenails of both feet 08/18/2018  . Fatigue 06/05/2013  . Bradycardia 06/05/2013  . Hypertension   . Gout   . Thyroid disease   . Coronary artery disease   . Renal disorder     Past Surgical History:  Procedure Laterality Date  . CATARACT EXTRACTION W/PHACO Right 08/21/2016   Procedure: CATARACT EXTRACTION PHACO AND INTRAOCULAR LENS PLACEMENT (IOC);  Surgeon: Birder Robson, MD;  Location: [facility] ORS;  Service: Ophthalmology;  Laterality: Right;  [place] 00:54.9 AP% 18.3 CDE 10.04 Fluid pack lot # [id number]  . CATARACT EXTRACTION W/PHACO Left 09/18/2016   Procedure: CATARACT EXTRACTION PHACO AND INTRAOCULAR LENS PLACEMENT (IOC);   Surgeon: Birder Robson, MD;  Location: [facility] ORS;  Service: Ophthalmology;  Laterality: Left;  [place] 00:56 AP% 19.7 CDE 11.07 Fluid pack lot # [id number]  . FRACTURE SURGERY     ANKLE  . HERNIA REPAIR    . UPPER GI ENDOSCOPY      Prior to Admission medications   Medication Sig Start Date End Date Taking? Authorizing Provider  allopurinol (ZYLOPRIM) 300 MG tablet Take 300 mg by mouth daily.    [provider]  cetirizine (ZYRTEC) 10 MG tablet TAKE 1 TABLET BY MOUTH AT BEDTIME FOR SEASONAL ALLERGIES 06/09/18   [provider]  Cholecalciferol (VITAMIN D3) 5000 UNITS TABS Take 5,000 Units by mouth daily.    [provider]  ciprofloxacin (CIPRO) 500 MG tablet  03/31/18   [provider]  cloNIDine (CATAPRES) 0.2 MG tablet Take 0.1 mg by mouth 2 (two) times daily.    [provider]  DULoxetine (CYMBALTA) 30 MG capsule Take 30 mg by mouth daily.    [provider]  losartan (COZAAR) 100 MG tablet Take 100 mg by mouth daily.    [provider]  metoprolol (LOPRESSOR) 100 MG tablet Take 100 mg by mouth daily. Taking 50mg     [provider]  metoprolol succinate (TOPROL-XL) 100 MG 24 hr tablet Take 100 mg by mouth daily. 06/10/18  [provider]  metroNIDAZOLE (FLAGYL) 500 MG tablet  03/31/18   [provider]  nystatin (MYCOSTATIN) 100000 UNIT/ML suspension  03/17/18   [provider]  pantoprazole (PROTONIX) 40 MG tablet Take 40 mg by mouth daily.    [provider]  sodium citrate-citric acid (ORACIT) 500-334 MG/5ML solution Take 30 mLs by mouth 2 (two) times daily after a meal. 10/08/17   Stoioff, Ronda Fairly, MD  spironolactone (ALDACTONE) 25 MG tablet Take 25 mg by mouth daily.    [provider]  SYNTHROID 100 MCG tablet TAKE 1 TABLET BY MOUTH ONCE DAILY ON AN EMPTY STOMACH. WAIT 30 MINUTES BEFORE TAKING OTHER MEDS. 08/07/18   [provider]  tamsulosin (FLOMAX) 0.4 MG CAPS  capsule Take 1 capsule (0.4 mg total) by mouth daily. 05/21/17   Stoioff, Ronda Fairly, MD  valACYclovir (VALTREX) 1000 MG tablet TAKE 2 TABLETS BY MOUTH FOR 1 DOSE NOW THEN REPEAT 2 TABLETS IN 12 HOURS 07/16/18   [provider]    Allergies Codeine, Morphine and related, Novocain [procaine], Other, Percocet [oxycodone-acetaminophen], Sulfa antibiotics, and Tramadol  Family History  Problem Relation Age of Onset  . Prostate cancer Neg Hx   . Bladder Cancer Neg Hx   . Kidney cancer Neg Hx     Social History Social History   Tobacco Use  . Smoking status: Never Smoker  . Smokeless tobacco: Never Used  Substance Use Topics  . Alcohol use: No  . Drug use: No    Review of Systems Constitutional: Positive for fever/chills Eyes: No visual changes. ENT: No sore throat. Cardiovascular: Denies chest pain. Respiratory: Denies shortness of breath.  Positive for cough Gastrointestinal: No abdominal pain.  No nausea, no vomiting.  No diarrhea.  No constipation. Genitourinary: Negative for dysuria. Musculoskeletal: Negative for neck pain.  Negative for back pain. Integumentary: Negative for rash. Neurological: Negative for headaches, focal weakness or numbness.   ____________________________________________   PHYSICAL EXAM:  VITAL SIGNS: ED Triage Vitals  Enc Vitals Group     BP 11/13/18 2301 (!) 142/74     Pulse Rate 11/13/18 2301 (!) 115     Resp 11/13/18 2301 20     Temp 11/13/18 2257 99.4 F (37.4 C)     Temp Source 11/13/18 2257 Oral     SpO2 11/13/18 2301 97 %     Weight 11/13/18 2257 92.5 kg (204 lb)     Height 11/13/18 2257 1.702 m (5\' 7" )     Head Circumference --      Peak Flow --      Pain Score 11/13/18 2257 0     Pain Loc --      Pain Edu? --      Excl. in [place]? --     Constitutional: Alert and oriented.  Eyes: Conjunctivae are normal.  Mouth/Throat: Mucous membranes are moist. Neck: No stridor.  No meningeal signs.   Cardiovascular: Normal rate,  regular rhythm. Good peripheral circulation. Grossly normal heart sounds. Respiratory: Normal respiratory effort.  No retractions. Gastrointestinal: Soft and nontender. No distention.   Musculoskeletal: No lower extremity tenderness nor edema. No gross deformities of extremities. Neurologic:  Normal speech and language. No gross focal neurologic deficits are appreciated.  Skin:  Skin is warm, dry and intact. Psychiatric: Mood and affect are normal. Speech and behavior are normal.   ED ECG REPORT I, [place] N Egan Berkheimer, the attending physician, personally viewed and interpreted this ECG.   Date: 11/13/2018  EKG Time: 11:02  PM  Rate: 110  Rhythm: Sinus tachycardia  Axis: Normal  Intervals: Normal  ST&T Change: None ____________________________________________   LABS (all labs ordered are listed, but only abnormal results are displayed)  Labs Reviewed  CULTURE, BLOOD (ROUTINE X 2)  CULTURE, BLOOD (ROUTINE X 2)  URINE CULTURE  SARS CORONAVIRUS 2 (HOSPITAL ORDER, [place] [facility])  CBC  COMPREHENSIVE METABOLIC PANEL  URINALYSIS, COMPLETE (UACMP) WITH MICROSCOPIC  LACTIC ACID, PLASMA  LACTIC ACID, PLASMA  APTT  PROTIME-INR    Procedures   ____________________________________________   INITIAL IMPRESSION / MDM / ASSESSMENT AND PLAN / ED COURSE  As part of my medical decision making, I reviewed the following data within the electronic MEDICAL RECORD NUMBER 78 year old male presented with above-stated history and physical exam concerning for infectious etiology including urinary tract infection.  Laboratory data consistent with a UTI.  Patient's lactic acid normal.  Urine culture pending patient received ceftriaxone 1 g IV in the emergency department will be prescribed Keflex for home.  Patient will be referred to urology for further outpatient evaluation secondary to urinary incontinence as well      ____________________________________________  FINAL  CLINICAL IMPRESSION(S) / ED DIAGNOSES  Final diagnoses:  Acute cystitis without hematuria  Urinary incontinence, unspecified type     MEDICATIONS GIVEN DURING THIS VISIT:  Medications  cefTRIAXone (ROCEPHIN) 1 g in sodium chloride 0.9 % 100 mL IVPB (has no administration in time range)     ED Discharge Orders    None      *Please note:  Bobby Lane was evaluated in Emergency Department on 11/13/2018 for the symptoms described in the history of present illness. He was evaluated in the context of the global COVID-19 pandemic, which necessitated consideration that the patient might be at risk for infection with the SARS-CoV-2 virus that causes COVID-19. Institutional protocols and algorithms that pertain to the evaluation of patients at risk for COVID-19 are in a state of rapid change based on information released by regulatory bodies including the CDC and federal and state organizations. These policies and algorithms were followed during the patient's care in the ED.  Some ED evaluations and interventions may be delayed as a result of limited staffing during the pandemic.*  Note:  This document was prepared using Dragon voice recognition software and may include unintentional dictation errors.   Gregor Hams, MD 11/14/18 [phone]

## 2018-11-13 NOTE — ED Triage Notes (Signed)
Pt presents from home via [facility] with c/o incontency of urine for past few days and weakness. Pt had fever of 100.6 for ems.  Pt alert and oriented x4 at this time. Other vital signs stable.

## 2018-11-14 LAB — URINALYSIS, COMPLETE (UACMP) WITH MICROSCOPIC
Bilirubin Urine: NEGATIVE
Glucose, UA: NEGATIVE mg/dL
Hgb urine dipstick: NEGATIVE
Ketones, ur: 5 mg/dL — AB
Nitrite: NEGATIVE
Protein, ur: NEGATIVE mg/dL
Specific Gravity, Urine: 1.017 (ref 1.005–1.030)
Squamous Epithelial / HPF: NONE SEEN (ref 0–5)
pH: 5 (ref 5.0–8.0)

## 2018-11-14 LAB — SARS CORONAVIRUS 2 BY RT PCR (HOSPITAL ORDER, PERFORMED IN ~~LOC~~ HOSPITAL LAB): SARS Coronavirus 2: NEGATIVE

## 2018-11-14 MED ORDER — CEPHALEXIN 500 MG PO CAPS: 500.0000 mg | ORAL_CAPSULE | Freq: Two times a day (BID) | ORAL | 0 refills | Status: DC

## 2018-11-14 NOTE — ED Notes (Signed)
Pt cleansed of small amount of stool and placed in brief. PT requested that his underwear and shirt be thrown away.

## 2018-11-14 NOTE — ED Notes (Signed)
Son to come and pick up pt.

## 2018-11-15 LAB — URINE CULTURE: Culture: >=100000 — AB

## 2018-11-17 ENCOUNTER — Other Ambulatory Visit: Payer: Self-pay | Admitting: Family Medicine

## 2018-11-17 ENCOUNTER — Ambulatory Visit: Payer: Medicare HMO | Admitting: Podiatry

## 2018-11-17 DIAGNOSIS — R32 Unspecified urinary incontinence: Secondary | ICD-10-CM

## 2018-11-18 ENCOUNTER — Telehealth: Payer: Self-pay

## 2018-11-18 ENCOUNTER — Ambulatory Visit: Payer: Medicare HMO | Admitting: Urology

## 2018-11-18 LAB — CULTURE, BLOOD (ROUTINE X 2)
Culture: NO GROWTH
Culture: NO GROWTH

## 2018-11-18 MED ORDER — AMOXICILLIN-POT CLAVULANATE 875-125 MG PO TABS: 1.0000 | ORAL_TABLET | Freq: Two times a day (BID) | ORAL | 0 refills | Status: DC

## 2018-11-18 NOTE — Telephone Encounter (Signed)
Pt. Returned call and I informed him of new abx called in to pharmacy and that he can stop Keflex. Pt. Expressed understanding.

## 2018-11-18 NOTE — Telephone Encounter (Signed)
-----   Message from Billey Co, MD sent at 11/18/2018  8:18 AM EDT ----- Regarding: change abx Please change his UTI abx to Augmentin 875-125 BID x 14 days. He can stop the keflex.   Follow up with me or stoioff(his prior MD) in 4-8 weeks with PVR.  Thanks Nickolas Madrid, MD 11/18/2018

## 2018-11-18 NOTE — Telephone Encounter (Signed)
Attempted to contact patient, left a vmail to call back no DPR on file New abx was sent to patient's pharmacy

## 2018-11-19 ENCOUNTER — Other Ambulatory Visit: Payer: Self-pay

## 2018-11-19 ENCOUNTER — Encounter: Payer: Self-pay | Admitting: Emergency Medicine

## 2018-11-19 ENCOUNTER — Inpatient Hospital Stay
Admission: EM | Admit: 2018-11-19 | Discharge: 2018-12-04 | DRG: 871 | Disposition: A | Discharge status: Skilled Nursing Facility | Payer: Medicare HMO | Attending: Internal Medicine | Admitting: Internal Medicine

## 2018-11-19 ENCOUNTER — Emergency Department: Payer: Medicare HMO

## 2018-11-19 DIAGNOSIS — Z87442 Personal history of urinary calculi: Secondary | ICD-10-CM

## 2018-11-19 DIAGNOSIS — A0472 Enterocolitis due to Clostridium difficile, not specified as recurrent: Secondary | ICD-10-CM | POA: Diagnosis present

## 2018-11-19 DIAGNOSIS — I2510 Atherosclerotic heart disease of native coronary artery without angina pectoris: Secondary | ICD-10-CM | POA: Diagnosis present

## 2018-11-19 DIAGNOSIS — N401 Enlarged prostate with lower urinary tract symptoms: Secondary | ICD-10-CM | POA: Diagnosis present

## 2018-11-19 DIAGNOSIS — K219 Gastro-esophageal reflux disease without esophagitis: Secondary | ICD-10-CM | POA: Diagnosis present

## 2018-11-19 DIAGNOSIS — M1990 Unspecified osteoarthritis, unspecified site: Secondary | ICD-10-CM | POA: Diagnosis present

## 2018-11-19 DIAGNOSIS — R509 Fever, unspecified: Secondary | ICD-10-CM

## 2018-11-19 DIAGNOSIS — Z888 Allergy status to other drugs, medicaments and biological substances status: Secondary | ICD-10-CM

## 2018-11-19 DIAGNOSIS — K567 Ileus, unspecified: Secondary | ICD-10-CM | POA: Diagnosis present

## 2018-11-19 DIAGNOSIS — N179 Acute kidney failure, unspecified: Secondary | ICD-10-CM | POA: Diagnosis present

## 2018-11-19 DIAGNOSIS — R531 Weakness: Secondary | ICD-10-CM

## 2018-11-19 DIAGNOSIS — G9341 Metabolic encephalopathy: Secondary | ICD-10-CM | POA: Diagnosis present

## 2018-11-19 DIAGNOSIS — Z79899 Other long term (current) drug therapy: Secondary | ICD-10-CM

## 2018-11-19 DIAGNOSIS — E8809 Other disorders of plasma-protein metabolism, not elsewhere classified: Secondary | ICD-10-CM | POA: Diagnosis present

## 2018-11-19 DIAGNOSIS — E039 Hypothyroidism, unspecified: Secondary | ICD-10-CM | POA: Diagnosis present

## 2018-11-19 DIAGNOSIS — M79601 Pain in right arm: Secondary | ICD-10-CM | POA: Diagnosis present

## 2018-11-19 DIAGNOSIS — I129 Hypertensive chronic kidney disease with stage 1 through stage 4 chronic kidney disease, or unspecified chronic kidney disease: Secondary | ICD-10-CM | POA: Diagnosis present

## 2018-11-19 DIAGNOSIS — M109 Gout, unspecified: Secondary | ICD-10-CM | POA: Diagnosis present

## 2018-11-19 DIAGNOSIS — I4891 Unspecified atrial fibrillation: Secondary | ICD-10-CM | POA: Diagnosis present

## 2018-11-19 DIAGNOSIS — Z9842 Cataract extraction status, left eye: Secondary | ICD-10-CM

## 2018-11-19 DIAGNOSIS — R296 Repeated falls: Secondary | ICD-10-CM | POA: Diagnosis present

## 2018-11-19 DIAGNOSIS — R350 Frequency of micturition: Secondary | ICD-10-CM | POA: Diagnosis not present

## 2018-11-19 DIAGNOSIS — Z961 Presence of intraocular lens: Secondary | ICD-10-CM | POA: Diagnosis present

## 2018-11-19 DIAGNOSIS — Z9181 History of falling: Secondary | ICD-10-CM | POA: Diagnosis not present

## 2018-11-19 DIAGNOSIS — N390 Urinary tract infection, site not specified: Secondary | ICD-10-CM | POA: Diagnosis present

## 2018-11-19 DIAGNOSIS — R300 Dysuria: Secondary | ICD-10-CM | POA: Diagnosis not present

## 2018-11-19 DIAGNOSIS — R0902 Hypoxemia: Secondary | ICD-10-CM

## 2018-11-19 DIAGNOSIS — Z79890 Hormone replacement therapy (postmenopausal): Secondary | ICD-10-CM

## 2018-11-19 DIAGNOSIS — A419 Sepsis, unspecified organism: Secondary | ICD-10-CM | POA: Diagnosis present

## 2018-11-19 DIAGNOSIS — Z885 Allergy status to narcotic agent status: Secondary | ICD-10-CM

## 2018-11-19 DIAGNOSIS — N182 Chronic kidney disease, stage 2 (mild): Secondary | ICD-10-CM | POA: Diagnosis present

## 2018-11-19 DIAGNOSIS — Z85828 Personal history of other malignant neoplasm of skin: Secondary | ICD-10-CM

## 2018-11-19 DIAGNOSIS — J9811 Atelectasis: Secondary | ICD-10-CM | POA: Diagnosis present

## 2018-11-19 DIAGNOSIS — Z20828 Contact with and (suspected) exposure to other viral communicable diseases: Secondary | ICD-10-CM | POA: Diagnosis present

## 2018-11-19 DIAGNOSIS — E785 Hyperlipidemia, unspecified: Secondary | ICD-10-CM | POA: Diagnosis present

## 2018-11-19 DIAGNOSIS — Z9841 Cataract extraction status, right eye: Secondary | ICD-10-CM

## 2018-11-19 DIAGNOSIS — J449 Chronic obstructive pulmonary disease, unspecified: Secondary | ICD-10-CM | POA: Diagnosis present

## 2018-11-19 DIAGNOSIS — R52 Pain, unspecified: Secondary | ICD-10-CM

## 2018-11-19 DIAGNOSIS — H9190 Unspecified hearing loss, unspecified ear: Secondary | ICD-10-CM | POA: Diagnosis present

## 2018-11-19 DIAGNOSIS — L8990 Pressure ulcer of unspecified site, unspecified stage: Secondary | ICD-10-CM | POA: Insufficient documentation

## 2018-11-19 DIAGNOSIS — Z960 Presence of urogenital implants: Secondary | ICD-10-CM | POA: Diagnosis not present

## 2018-11-19 DIAGNOSIS — A414 Sepsis due to anaerobes: Principal | ICD-10-CM | POA: Diagnosis present

## 2018-11-19 DIAGNOSIS — Z882 Allergy status to sulfonamides status: Secondary | ICD-10-CM

## 2018-11-19 DIAGNOSIS — I10 Essential (primary) hypertension: Secondary | ICD-10-CM | POA: Diagnosis not present

## 2018-11-19 DIAGNOSIS — R338 Other retention of urine: Secondary | ICD-10-CM | POA: Diagnosis not present

## 2018-11-19 DIAGNOSIS — Z89012 Acquired absence of left thumb: Secondary | ICD-10-CM | POA: Diagnosis not present

## 2018-11-19 LAB — URINALYSIS, COMPLETE (UACMP) WITH MICROSCOPIC
Bacteria, UA: NONE SEEN
Bilirubin Urine: NEGATIVE
Glucose, UA: NEGATIVE mg/dL
Hgb urine dipstick: NEGATIVE
Ketones, ur: 5 mg/dL — AB
Leukocytes,Ua: NEGATIVE
Nitrite: NEGATIVE
Protein, ur: NEGATIVE mg/dL
Specific Gravity, Urine: 1.018 (ref 1.005–1.030)
pH: 5 (ref 5.0–8.0)

## 2018-11-19 LAB — CBC WITH DIFFERENTIAL/PLATELET
Abs Immature Granulocytes: 0.55 10*3/uL — ABNORMAL HIGH (ref 0.00–0.07)
Basophils Absolute: 0.1 10*3/uL (ref 0.0–0.1)
Basophils Relative: 0 %
Eosinophils Absolute: 0 10*3/uL (ref 0.0–0.5)
Eosinophils Relative: 0 %
HCT: 37.3 % — ABNORMAL LOW (ref 39.0–52.0)
Hemoglobin: 12.4 g/dL — ABNORMAL LOW (ref 13.0–17.0)
Immature Granulocytes: 2 %
Lymphocytes Relative: 3 %
Lymphs Abs: 0.7 10*3/uL (ref 0.7–4.0)
MCH: 34.3 pg — ABNORMAL HIGH (ref 26.0–34.0)
MCHC: 33.2 g/dL (ref 30.0–36.0)
MCV: 103 fL — ABNORMAL HIGH (ref 80.0–100.0)
Monocytes Absolute: 1.2 10*3/uL — ABNORMAL HIGH (ref 0.1–1.0)
Monocytes Relative: 5 %
Neutro Abs: 24.3 10*3/uL — ABNORMAL HIGH (ref 1.7–7.7)
Neutrophils Relative %: 90 %
Platelets: 295 10*3/uL (ref 150–400)
RBC: 3.62 MIL/uL — ABNORMAL LOW (ref 4.22–5.81)
RDW: 14.1 % (ref 11.5–15.5)
WBC: 26.6 10*3/uL — ABNORMAL HIGH (ref 4.0–10.5)
nRBC: 0 % (ref 0.0–0.2)

## 2018-11-19 LAB — COMPREHENSIVE METABOLIC PANEL
ALT: 13 U/L (ref 0–44)
AST: 18 U/L (ref 15–41)
Albumin: 3.4 g/dL — ABNORMAL LOW (ref 3.5–5.0)
Alkaline Phosphatase: 60 U/L (ref 38–126)
Anion gap: 12 (ref 5–15)
BUN: 46 mg/dL — ABNORMAL HIGH (ref 8–23)
CO2: 23 mmol/L (ref 22–32)
Calcium: 9.2 mg/dL (ref 8.9–10.3)
Chloride: 101 mmol/L (ref 98–111)
Creatinine, Ser: 1.63 mg/dL — ABNORMAL HIGH (ref 0.61–1.24)
GFR calc Af Amer: 47 mL/min — ABNORMAL LOW (ref >60)
GFR calc non Af Amer: 40 mL/min — ABNORMAL LOW (ref >60)
Glucose, Bld: 131 mg/dL — ABNORMAL HIGH (ref 70–99)
Potassium: 4.2 mmol/L (ref 3.5–5.1)
Sodium: 136 mmol/L (ref 135–145)
Total Bilirubin: 0.9 mg/dL (ref 0.3–1.2)
Total Protein: 7.2 g/dL (ref 6.5–8.1)

## 2018-11-19 LAB — VANCOMYCIN, RANDOM: Vancomycin Rm: 14

## 2018-11-19 LAB — TROPONIN I (HIGH SENSITIVITY)
Troponin I (High Sensitivity): 28 ng/L — ABNORMAL HIGH (ref <18)
Troponin I (High Sensitivity): 35 ng/L — ABNORMAL HIGH (ref <18)

## 2018-11-19 LAB — TSH: TSH: 0.773 u[IU]/mL (ref 0.350–4.500)

## 2018-11-19 LAB — SARS CORONAVIRUS 2 BY RT PCR (HOSPITAL ORDER, PERFORMED IN ~~LOC~~ HOSPITAL LAB): SARS Coronavirus 2: NEGATIVE

## 2018-11-19 LAB — HEMOGLOBIN A1C
Hgb A1c MFr Bld: 5 % (ref 4.8–5.6)
Mean Plasma Glucose: 96.8 mg/dL

## 2018-11-19 LAB — LACTIC ACID, PLASMA
Lactic Acid, Venous: 2.9 mmol/L (ref 0.5–1.9)
Lactic Acid, Venous: 1.4 mmol/L (ref 0.5–1.9)

## 2018-11-19 MED ORDER — VANCOMYCIN HCL 1.25 G IV SOLR
1250.0000 mg | Freq: Once | INTRAVENOUS | Status: AC
  Administered 2018-11-19: 1250 mg via INTRAVENOUS
  Filled 2018-11-19: qty 1250

## 2018-11-19 MED ORDER — ONDANSETRON HCL 4 MG PO TABS: 4.0000 mg | ORAL_TABLET | Freq: Four times a day (QID) | ORAL | Status: DC | PRN

## 2018-11-19 MED ORDER — ONDANSETRON HCL 4 MG/2ML IJ SOLN: 4.0000 mg | Freq: Four times a day (QID) | INTRAMUSCULAR | Status: DC | PRN

## 2018-11-19 MED ORDER — TAMSULOSIN HCL 0.4 MG PO CAPS
0.4000 mg | ORAL_CAPSULE | Freq: Every day | ORAL | Status: DC
  Administered 2018-11-20 – 2018-12-04 (×15): 0.4 mg via ORAL
  Filled 2018-11-19 (×15): qty 1

## 2018-11-19 MED ORDER — CHLORHEXIDINE GLUCONATE CLOTH 2 % EX PADS
6.0000 | MEDICATED_PAD | Freq: Every day | CUTANEOUS | Status: DC
  Administered 2018-11-19 – 2018-12-04 (×14): 6 via TOPICAL

## 2018-11-19 MED ORDER — SODIUM CHLORIDE 0.9 % IV SOLN
2.0000 g | Freq: Two times a day (BID) | INTRAVENOUS | Status: DC
  Administered 2018-11-19 – 2018-11-20 (×2): 2 g via INTRAVENOUS
  Filled 2018-11-19 (×3): qty 2

## 2018-11-19 MED ORDER — IOHEXOL 350 MG/ML SOLN
60.0000 mL | Freq: Once | INTRAVENOUS | Status: AC | PRN
  Administered 2018-11-19: 60 mL via INTRAVENOUS

## 2018-11-19 MED ORDER — LOSARTAN POTASSIUM 50 MG PO TABS
100.0000 mg | ORAL_TABLET | Freq: Every day | ORAL | Status: DC
  Administered 2018-11-20 – 2018-12-04 (×14): 100 mg via ORAL
  Filled 2018-11-19 (×16): qty 2

## 2018-11-19 MED ORDER — CLONIDINE HCL 0.1 MG PO TABS
0.1000 mg | ORAL_TABLET | Freq: Two times a day (BID) | ORAL | Status: DC
  Administered 2018-11-19 – 2018-12-04 (×28): 0.1 mg via ORAL
  Filled 2018-11-19 (×29): qty 1

## 2018-11-19 MED ORDER — ACETAMINOPHEN 325 MG PO TABS
650.0000 mg | ORAL_TABLET | Freq: Four times a day (QID) | ORAL | Status: DC | PRN
  Administered 2018-11-19 – 2018-12-01 (×6): 650 mg via ORAL
  Filled 2018-11-19 (×7): qty 2

## 2018-11-19 MED ORDER — VANCOMYCIN VARIABLE DOSE PER UNSTABLE RENAL FUNCTION (PHARMACIST DOSING): Status: DC

## 2018-11-19 MED ORDER — VANCOMYCIN HCL 10 G IV SOLR
1250.0000 mg | Freq: Once | INTRAVENOUS | Status: DC
  Filled 2018-11-19 (×2): qty 1250

## 2018-11-19 MED ORDER — METOPROLOL SUCCINATE ER 50 MG PO TB24
100.0000 mg | ORAL_TABLET | Freq: Every day | ORAL | Status: DC
  Administered 2018-11-19 – 2018-12-04 (×16): 100 mg via ORAL
  Filled 2018-11-19: qty 2
  Filled 2018-11-19: qty 1
  Filled 2018-11-19: qty 2
  Filled 2018-11-19 (×2): qty 1
  Filled 2018-11-19: qty 2
  Filled 2018-11-19: qty 1
  Filled 2018-11-19 (×5): qty 2
  Filled 2018-11-19: qty 1
  Filled 2018-11-19 (×3): qty 2

## 2018-11-19 MED ORDER — ACETAMINOPHEN 650 MG RE SUPP: 650.0000 mg | Freq: Four times a day (QID) | RECTAL | Status: DC | PRN

## 2018-11-19 MED ORDER — DOCUSATE SODIUM 100 MG PO CAPS
100.0000 mg | ORAL_CAPSULE | Freq: Two times a day (BID) | ORAL | Status: DC
  Administered 2018-11-21: 100 mg via ORAL
  Filled 2018-11-19 (×4): qty 1

## 2018-11-19 MED ORDER — SODIUM CHLORIDE 0.9 % IV BOLUS (SEPSIS)
1000.0000 mL | Freq: Once | INTRAVENOUS | Status: AC
  Administered 2018-11-19: 1000 mL via INTRAVENOUS

## 2018-11-19 MED ORDER — SODIUM CHLORIDE 0.9 % IV SOLN
INTRAVENOUS | Status: DC
  Administered 2018-11-19 – 2018-11-27 (×16): via INTRAVENOUS

## 2018-11-19 MED ORDER — DILTIAZEM HCL 25 MG/5ML IV SOLN
10.0000 mg | Freq: Once | INTRAVENOUS | Status: AC
  Administered 2018-11-19: 10 mg via INTRAVENOUS
  Filled 2018-11-19: qty 5

## 2018-11-19 MED ORDER — HEPARIN SODIUM (PORCINE) 5000 UNIT/ML IJ SOLN
5000.0000 [IU] | Freq: Three times a day (TID) | INTRAMUSCULAR | Status: DC
  Administered 2018-11-19 – 2018-12-04 (×45): 5000 [IU] via SUBCUTANEOUS
  Filled 2018-11-19 (×46): qty 1

## 2018-11-19 MED ORDER — VANCOMYCIN HCL IN DEXTROSE 1-5 GM/200ML-% IV SOLN: 1000.0000 mg | Freq: Once | INTRAVENOUS | Status: DC

## 2018-11-19 MED ORDER — SODIUM CHLORIDE 0.9 % IV SOLN
2.0000 g | Freq: Once | INTRAVENOUS | Status: AC
  Administered 2018-11-19: 2 g via INTRAVENOUS
  Filled 2018-11-19: qty 2

## 2018-11-19 MED ORDER — METRONIDAZOLE IN NACL 5-0.79 MG/ML-% IV SOLN
500.0000 mg | Freq: Three times a day (TID) | INTRAVENOUS | Status: DC
  Administered 2018-11-19 – 2018-11-20 (×3): 500 mg via INTRAVENOUS
  Filled 2018-11-19 (×5): qty 100

## 2018-11-19 MED ORDER — ACETAMINOPHEN 500 MG PO TABS
1000.0000 mg | ORAL_TABLET | Freq: Once | ORAL | Status: AC
  Administered 2018-11-19: 05:00:00 1000 mg via ORAL
  Filled 2018-11-19: qty 2

## 2018-11-19 MED ORDER — ROSUVASTATIN CALCIUM 5 MG PO TABS
5.0000 mg | ORAL_TABLET | ORAL | Status: DC
  Administered 2018-11-20 – 2018-11-22 (×2): 5 mg via ORAL
  Filled 2018-11-19 (×2): qty 1

## 2018-11-19 MED ORDER — LEVOTHYROXINE SODIUM 100 MCG PO TABS
100.0000 ug | ORAL_TABLET | Freq: Every day | ORAL | Status: DC
  Administered 2018-11-20 – 2018-12-04 (×15): 100 ug via ORAL
  Filled 2018-11-19 (×15): qty 1

## 2018-11-19 MED ORDER — VANCOMYCIN HCL 10 G IV SOLR
2000.0000 mg | Freq: Once | INTRAVENOUS | Status: AC
  Administered 2018-11-19: 05:00:00 2000 mg via INTRAVENOUS
  Filled 2018-11-19: qty 2000

## 2018-11-19 MED ORDER — ALLOPURINOL 100 MG PO TABS
300.0000 mg | ORAL_TABLET | Freq: Every day | ORAL | Status: DC
  Administered 2018-11-20 – 2018-12-04 (×15): 300 mg via ORAL
  Filled 2018-11-19: qty 3
  Filled 2018-11-19: qty 1
  Filled 2018-11-19 (×3): qty 3
  Filled 2018-11-19: qty 1
  Filled 2018-11-19 (×5): qty 3
  Filled 2018-11-19: qty 1
  Filled 2018-11-19 (×2): qty 3
  Filled 2018-11-19: qty 1

## 2018-11-19 MED ORDER — SPIRONOLACTONE 25 MG PO TABS
25.0000 mg | ORAL_TABLET | Freq: Every day | ORAL | Status: DC
  Administered 2018-11-20 – 2018-11-22 (×3): 25 mg via ORAL
  Filled 2018-11-19 (×3): qty 1

## 2018-11-19 MED ORDER — SODIUM CHLORIDE 0.9 % IV BOLUS (SEPSIS)
1000.0000 mL | Freq: Once | INTRAVENOUS | Status: AC
  Administered 2018-11-19: 05:00:00 1000 mL via INTRAVENOUS

## 2018-11-19 MED ORDER — SODIUM CHLORIDE 0.9 % IV BOLUS
1000.0000 mL | Freq: Once | INTRAVENOUS | Status: AC
  Administered 2018-11-19: 04:00:00 1000 mL via INTRAVENOUS

## 2018-11-19 MED ORDER — DILTIAZEM HCL 25 MG/5ML IV SOLN
10.0000 mg | Freq: Once | INTRAVENOUS | Status: AC
  Administered 2018-11-19: 10 mg via INTRAVENOUS

## 2018-11-19 MED ORDER — METRONIDAZOLE IN NACL 5-0.79 MG/ML-% IV SOLN
500.0000 mg | Freq: Once | INTRAVENOUS | Status: AC
  Administered 2018-11-19: 05:00:00 500 mg via INTRAVENOUS
  Filled 2018-11-19: qty 100

## 2018-11-19 NOTE — Progress Notes (Signed)
Pharmacy Antibiotic Note  Bobby Lane is a 78 y.o. male admitted on 11/19/2018 with sepsis s/t UTI w/ h/o bladder incontinence.  Pharmacy has been consulted for vancomycin/cefepime dosing. Patient appears to have a h/o of CKD per his baseline Scr being anywhere from 1.3 - 1.6 and appears to be either in AKI on CKD or may just be at his baseline (wide variation of Scr trend).  Plan: Patient received vanc 2g IV load, cefepime 2g, flagyl 500 mg IV x 1 in ED  Will check a vanc random 12 hours post dose at 10/7 @ 1600 since patient appears to be in renal insufficiency. Goal random < 20 mcg/mL Will dose vanc 15 mg/kg if random level is below that threshold.  Will continue cefepime 2g IV q12h per CrCl 30 - 60 ml/min and flagyl 500 mg IV q8h. Will continue to monitor renal function and s/sx of infection.  Weight: 203 lb 11.3 oz (92.4 kg)  No data recorded.  Recent Labs  Lab 11/13/18 2300 11/19/18 0328  WBC 10.1 26.6*  CREATININE 1.45* 1.63*  LATICACIDVEN 1.8 2.9*    Estimated Creatinine Clearance: 41.8 mL/min (A) (by C-G formula based on SCr of 1.63 mg/dL (H)).    Allergies  Allergen Reactions  . Codeine Nausea And Vomiting  . Morphine And Related Nausea And Vomiting  . Novocain [Procaine] Hives    NO TROUBLE IN OR  . Other Nausea And Vomiting  . Percocet [Oxycodone-Acetaminophen] Nausea And Vomiting  . Sulfa Antibiotics Hives  . Tramadol Nausea And Vomiting    Note: upset stomach Note: upset stomach     Thank you for allowing pharmacy to be a part of this patient's care.  Tobie Lords, PharmD, BCPS Clinical Pharmacist 11/19/2018 6:36 AM

## 2018-11-19 NOTE — ED Notes (Signed)
Attempted report to floor.  

## 2018-11-19 NOTE — H&P (Signed)
Bobby Lane is an 78 y.o. male.   Chief Complaint: Acute mental status change HPI: The patient with past medical history of coronary artery disease, hypertension, hypothyroidism, COPD, chronic kidney disease and skin cancer presents to the emergency department with altered mental status.  The patient had been diagnosed recently with urinary tract infection.  He has had issues with urinary retention in the past and has missed a few urology appointments.  The patient is been taking antibiotics but presented to the emergency department febrile.  He met criteria for sepsis which prompted action of blood and urine cultures.  Cefepime, metronidazole and vancomycin were started and the hospitalist service was called for admission.  Past Medical History:  Diagnosis Date  . Arthritis   . Cancer ([place])    SKIN  . COPD (chronic obstructive pulmonary disease) ([place])   . Coronary artery disease   . Dyspnea    DOE  . GERD (gastroesophageal reflux disease)   . Gout   . Heart murmur   . History of hiatal hernia   . History of kidney stones   . History of wheezing   . HOH (hard of hearing)   . Hypertension   . Hypothyroidism   . Oxygen deficit    PRN USE  . Renal disorder   . Thyroid disease     Past Surgical History:  Procedure Laterality Date  . CATARACT EXTRACTION W/PHACO Right 08/21/2016   Procedure: CATARACT EXTRACTION PHACO AND INTRAOCULAR LENS PLACEMENT (IOC);  Surgeon: Birder Robson, MD;  Location: [facility] ORS;  Service: Ophthalmology;  Laterality: Right;  [place] 00:54.9 AP% 18.3 CDE 10.04 Fluid pack lot # [id number]  . CATARACT EXTRACTION W/PHACO Left 09/18/2016   Procedure: CATARACT EXTRACTION PHACO AND INTRAOCULAR LENS PLACEMENT (IOC);  Surgeon: Birder Robson, MD;  Location: [facility] ORS;  Service: Ophthalmology;  Laterality: Left;  [place] 00:56 AP% 19.7 CDE 11.07 Fluid pack lot # [id number]  . FRACTURE SURGERY     ANKLE  . HERNIA REPAIR    . UPPER GI ENDOSCOPY      Family History  Problem  Relation Age of Onset  . Prostate cancer Neg Hx   . Bladder Cancer Neg Hx   . Kidney cancer Neg Hx    Social History:  reports that he has never smoked. He has never used smokeless tobacco. He reports that he does not drink alcohol or use drugs.  Allergies:  Allergies  Allergen Reactions  . Codeine Nausea And Vomiting  . Morphine And Related Nausea And Vomiting  . Novocain [Procaine] Hives    NO TROUBLE IN OR  . Other Nausea And Vomiting  . Percocet [Oxycodone-Acetaminophen] Nausea And Vomiting  . Sulfa Antibiotics Hives  . Tramadol Nausea And Vomiting    Note: upset stomach Note: upset stomach     Prior to Admission medications   Medication Sig Start Date End Date Taking? Authorizing Provider  allopurinol (ZYLOPRIM) 300 MG tablet Take 300 mg by mouth daily.   Yes [provider]  amoxicillin-clavulanate (AUGMENTIN) 875-125 MG tablet Take 1 tablet by mouth every 12 (twelve) hours. 11/18/18  Yes Billey Co, MD  cetirizine (ZYRTEC) 10 MG tablet Take 10 mg by mouth at bedtime.  06/09/18  Yes [provider]  clonazePAM (KLONOPIN) 0.5 MG tablet Take 0.5 mg by mouth at bedtime as needed for anxiety.  09/08/18  Yes [provider]  DULoxetine (CYMBALTA) 30 MG capsule Take 30 mg by mouth 2 (two) times daily.    Yes [provider]  losartan (COZAAR) 100 MG tablet Take 100 mg by mouth daily.   Yes [provider]  metoprolol succinate (TOPROL-XL) 100 MG 24 hr tablet Take 100 mg by mouth daily. 06/10/18  Yes [provider]  rosuvastatin (CRESTOR) 5 MG tablet Take 5 mg by mouth every other day. 09/08/18  Yes [provider]  spironolactone (ALDACTONE) 25 MG tablet Take 25 mg by mouth daily.   Yes [provider]  SYNTHROID 100 MCG tablet Take 100 mcg by mouth daily before breakfast.  08/07/18  Yes [provider]  Cholecalciferol (VITAMIN D3) 5000 UNITS TABS Take 5,000 Units by mouth daily.    [provider]  cloNIDine (CATAPRES) 0.2 MG tablet Take 0.1 mg by mouth 2 (two) times daily.    [provider]  metroNIDAZOLE (FLAGYL) 500 MG tablet  03/31/18   [provider]  pantoprazole (PROTONIX) 40 MG tablet Take 40 mg by mouth daily.    [provider]  sodium citrate-citric acid (ORACIT) 500-334 MG/5ML solution Take 30 mLs by mouth 2 (two) times daily after a meal. 10/08/17   Stoioff, Ronda Fairly, MD  sucralfate (CARAFATE) 1 g tablet Take 1 g by mouth 4 (four) times daily. 10/10/18   [provider]  tamsulosin (FLOMAX) 0.4 MG CAPS capsule Take 1 capsule (0.4 mg total) by mouth daily. 05/21/17   Stoioff, Ronda Fairly, MD  valACYclovir (VALTREX) 1000 MG tablet TAKE 2 TABLETS BY MOUTH FOR 1 DOSE NOW THEN REPEAT 2 TABLETS IN 12 HOURS 07/16/18   [provider]     Results for orders placed or performed during the hospital encounter of 11/19/18 (from the past 48 hour(s))  CBC with Differential     Status: Abnormal   Collection Time: 11/19/18  3:28 AM  Result Value Ref Range   WBC 26.6 (H) 4.0 - 10.5 K/uL   RBC 3.62 (L) 4.22 - 5.81 MIL/uL   Hemoglobin 12.4 (L) 13.0 - 17.0 g/dL   HCT 37.3 (L) 39.0 - 52.0 %   MCV 103.0 (H) 80.0 - 100.0 fL   MCH 34.3 (H) 26.0 - 34.0 pg   MCHC 33.2 30.0 - 36.0 g/dL   RDW 14.1 11.5 - 15.5 %   Platelets 295 150 - 400 K/uL   nRBC 0.0 0.0 - 0.2 %   Neutrophils Relative % 90 %   Neutro Abs 24.3 (H) 1.7 - 7.7 K/uL   Lymphocytes Relative 3 %   Lymphs Abs 0.7 0.7 - 4.0 K/uL   Monocytes Relative 5 %   Monocytes Absolute 1.2 (H) 0.1 - 1.0 K/uL   Eosinophils Relative 0 %   Eosinophils Absolute 0.0 0.0 - 0.5 K/uL   Basophils Relative 0 %   Basophils Absolute 0.1 0.0 - 0.1 K/uL   WBC Morphology MORPHOLOGY UNREMARKABLE    Smear Review MORPHOLOGY UNREMARKABLE    Immature Granulocytes 2 %   Abs Immature Granulocytes 0.55 (H) 0.00 - 0.07 K/uL   Polychromasia PRESENT     Comment: Performed at [facility], [address] [place], [place] [address]  Comprehensive metabolic panel     Status: Abnormal   Collection Time: 11/19/18  3:28 AM  Result Value Ref Range   Sodium 136 135 - 145 mmol/L   Potassium 4.2 3.5 - 5.1 mmol/L   Chloride 101 98 - 111 mmol/L   CO2 23 22 - 32 mmol/L   Glucose, Bld 131 (H) 70 - 99 mg/dL   BUN 46 (  H) 8 - 23 mg/dL   Creatinine, Ser 1.63 (H) 0.61 - 1.24 mg/dL   Calcium 9.2 8.9 - 10.3 mg/dL   Total Protein 7.2 6.5 - 8.1 g/dL   Albumin 3.4 (L) 3.5 - 5.0 g/dL   AST 18 15 - 41 U/L   ALT 13 0 - 44 U/L   Alkaline Phosphatase 60 38 - 126 U/L   Total Bilirubin 0.9 0.3 - 1.2 mg/dL   GFR calc non Af Amer 40 (L) >60 mL/min   GFR calc Af Amer 47 (L) >60 mL/min   Anion gap 12 5 - 15    Comment: Performed at [facility], [address] [place], [place] [address]  Lactic acid, plasma     Status: Abnormal   Collection Time: 11/19/18  3:28 AM  Result Value Ref Range   Lactic Acid, Venous 2.9 (HH) 0.5 - 1.9 mmol/L    Comment: CRITICAL RESULT CALLED TO, READ BACK BY AND VERIFIED WITH VANESSA ASHLEY AT 0418 ON 11/19/18 RWW Performed at [facility], [place], [place] [address]   Troponin I (High Sensitivity)     Status: Abnormal   Collection Time: 11/19/18  3:28 AM  Result Value Ref Range   Troponin I (High Sensitivity) 28 (H) <18 ng/L    Comment: (NOTE) Elevated high sensitivity troponin I (hsTnI) values and significant  changes across serial measurements may suggest ACS but many other  chronic and acute conditions are known to elevate hsTnI results.  Refer to the "Links" section for chest pain algorithms and additional  guidance. Performed at [facility], [address] [place], [place] [address]   SARS Coronavirus 2 [facility] order, Performed in [facility] hospital lab) Nasopharyngeal Nasopharyngeal Swab     Status: None   Collection Time: 11/19/18  3:28 AM   Specimen: Nasopharyngeal Swab  Result Value Ref Range   SARS Coronavirus  2 NEGATIVE NEGATIVE    Comment: (NOTE) If result is NEGATIVE SARS-CoV-2 target nucleic acids are NOT DETECTED. The SARS-CoV-2 RNA is generally detectable in upper and lower  respiratory specimens during the acute phase of infection. The lowest  concentration of SARS-CoV-2 viral copies this assay can detect is 250  copies / mL. A negative result does not preclude SARS-CoV-2 infection  and should not be used as the sole basis for treatment or other  patient management decisions.  A negative result may occur with  improper specimen collection / handling, submission of specimen other  than nasopharyngeal swab, presence of viral mutation(s) within the  areas targeted by this assay, and inadequate number of viral copies  (<250 copies / mL). A negative result must be combined with clinical  observations, patient history, and epidemiological information. If result is POSITIVE SARS-CoV-2 target nucleic acids are DETECTED. The SARS-CoV-2 RNA is generally detectable in upper and lower  respiratory specimens dur ing the acute phase of infection.  Positive  results are indicative of active infection with SARS-CoV-2.  Clinical  correlation with patient history and other diagnostic information is  necessary to determine patient infection status.  Positive results do  not rule out bacterial infection or co-infection with other viruses. If result is PRESUMPTIVE POSTIVE SARS-CoV-2 nucleic acids MAY BE PRESENT.   A presumptive positive result was obtained on the submitted specimen  and confirmed on repeat testing.  While 2019 novel coronavirus  (SARS-CoV-2) nucleic acids may be present in the submitted sample  additional confirmatory testing may be necessary for epidemiological  and / or clinical management purposes  to differentiate between  SARS-CoV-2 and other Sarbecovirus currently known to infect humans.  If clinically indicated additional testing with an alternate test  methodology [phone]) is  advised. The SARS-CoV-2 RNA is generally  detectable in upper and lower respiratory sp ecimens during the acute  phase of infection. The expected result is Negative. Fact Sheet for Patients:  StrictlyIdeas.no Fact Sheet for Healthcare Providers: BankingDealers.co.za This test is not yet approved or cleared by the [place] FDA and has been authorized for detection and/or diagnosis of SARS-CoV-2 by FDA under an Emergency Use Authorization (EUA).  This EUA will remain in effect (meaning this test can be used) for the duration of the COVID-19 declaration under Section 564(b)(1) of the Act, 21 U.S.C. section 360bbb-3(b)(1), unless the authorization is terminated or revoked sooner. Performed at [facility], [address] [place], [place] [address]   Urinalysis, Complete w Microscopic     Status: Abnormal   Collection Time: 11/19/18  5:45 AM  Result Value Ref Range   Color, Urine YELLOW (A) YELLOW   APPearance HAZY (A) CLEAR   Specific Gravity, Urine 1.018 1.005 - 1.030   pH 5.0 5.0 - 8.0   Glucose, UA NEGATIVE NEGATIVE mg/dL   Hgb urine dipstick NEGATIVE NEGATIVE   Bilirubin Urine NEGATIVE NEGATIVE   Ketones, ur 5 (A) NEGATIVE mg/dL   Protein, ur NEGATIVE NEGATIVE mg/dL   Nitrite NEGATIVE NEGATIVE   Leukocytes,Ua NEGATIVE NEGATIVE   RBC / HPF 0-5 0 - 5 RBC/hpf   WBC, UA 0-5 0 - 5 WBC/hpf   Bacteria, UA NONE SEEN NONE SEEN   Squamous Epithelial / LPF 0-5 0 - 5   Mucus PRESENT     Comment: Performed at [facility], [place], [place] [address]  Troponin I (High Sensitivity)     Status: Abnormal   Collection Time: 11/19/18  6:57 AM  Result Value Ref Range   Troponin I (High Sensitivity) 35 (H) <18 ng/L    Comment: (NOTE) Elevated high sensitivity troponin I (hsTnI) values and significant  changes across serial measurements may suggest ACS but many other  chronic and acute conditions are known  to elevate hsTnI results.  Refer to the "Links" section for chest pain algorithms and additional  guidance. Performed at [facility], [place],  [address]    Ct Angio Chest Pe W/cm &/or Wo Cm  Result Date: 11/19/2018 CLINICAL DATA:  Hypoxia with new onset atrial fibrillation.  Fever. EXAM: CT ANGIOGRAPHY CHEST WITH CONTRAST TECHNIQUE: Multidetector CT imaging of the chest was performed using the standard protocol during bolus administration of intravenous contrast. Multiplanar CT image reconstructions and MIPs were obtained to evaluate the vascular anatomy. CONTRAST:  28m OMNIPAQUE IOHEXOL 350 MG/ML SOLN COMPARISON:  None. FINDINGS: Cardiovascular: Limited study primarily due to bolus dispersion and intermittent respiratory motion. No evidence of central or lobar pulmonary embolism. No acute aortic finding. There is multifocal atherosclerotic calcification of the aorta and coronaries. Normal heart size. No pericardial effusion Mediastinum/Nodes: Negative for adenopathy or mass Lungs/Pleura: There is no edema, consolidation, effusion, or pneumothorax. Mild dependent atelectasis. Upper Abdomen: Negative Musculoskeletal: Spondylosis and generalized degenerative disease. 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 Osteopenia Review of the MIP images confirms the above findings. IMPRESSION: 1. Very limited pulmonary artery evaluation. No evidence of main or lobar pulmonary embolism. 2. Aortic and coronary atherosclerosis. Electronically Signed   By: JMonte FantasiaM.D.   On:  11/19/2018 06:08   Dg Chest Port 1 View  Result Date: 11/19/2018 CLINICAL DATA:  Hypoxia EXAM: PORTABLE CHEST 1 VIEW COMPARISON:  Six days ago FINDINGS: Normal heart size and mediastinal contours. There is no edema, consolidation, effusion, or pneumothorax. No acute osseous finding. Severe glenohumeral osteoarthritis. IMPRESSION: No evidence of active disease. Electronically Signed   By: Monte Fantasia M.D.   On: 11/19/2018 04:50    Review of Systems  Constitutional: Negative for chills and fever.       The patient reports that he feels well but, of course he has no recollection of his altered mental status at the time of our interview  HENT: Negative for sore throat and tinnitus.   Eyes: Negative for blurred vision and redness.  Respiratory: Negative for cough and shortness of breath.   Cardiovascular: Negative for chest pain, palpitations, orthopnea and PND.  Gastrointestinal: Negative for abdominal pain, diarrhea, nausea and vomiting.  Genitourinary: Negative for dysuria, frequency and urgency.  Musculoskeletal: Negative for joint pain and myalgias.  Skin: Negative for rash.       No lesions  Neurological: Negative for speech change, focal weakness and weakness.  Endo/Heme/Allergies: Does not bruise/bleed easily.       No temperature intolerance  Psychiatric/Behavioral: Negative for depression and suicidal ideas.    Blood pressure (!) 115/55, pulse 91, temperature 99.9 F (37.7 C), temperature source Rectal, resp. rate (!) 21, weight 92.4 kg, SpO2 100 %. Physical Exam  Vitals reviewed. Constitutional: He is oriented to person, place, and time. He appears well-developed and well-nourished. No distress.  HENT:  Head: Normocephalic and atraumatic.  Mouth/Throat: Oropharynx is clear and moist.  Eyes: Pupils are equal, round, and reactive to light. Conjunctivae and EOM are normal. No scleral icterus.  Neck: Normal range of motion. Neck supple. No JVD present. No tracheal deviation present. No thyromegaly present.  Cardiovascular: Normal rate, regular rhythm and normal heart sounds. Exam reveals no gallop and no friction rub.  No murmur heard. Respiratory: Effort normal and breath sounds normal. No respiratory distress.  GI: Soft. Bowel sounds are normal. He exhibits no distension. There is no abdominal tenderness.  Genitourinary:    Genitourinary Comments: Deferred    Musculoskeletal: Normal range of motion.        General: No edema.  Lymphadenopathy:    He has no cervical adenopathy.  Neurological: He is alert and oriented to person, place, and time. No cranial nerve deficit.  Skin: Skin is warm and dry. No rash noted. No erythema.  Wound left nasal bridge scabbed  Psychiatric: He has a normal mood and affect. His behavior is normal. Judgment and thought content normal.     Assessment/Plan This is a 78 year old male admitted for sepsis. 1.  Sepsis: The patient meets criteria via tachycardia, tachypnea, leukocytosis and fever.  The patient's lactic acid is also elevated.  He is hemodynamically stable.  Hydrate aggressively with intravenous fluid.  Initially urine thought to be the source however UA from this encounter is clear.  Continue broad-spectrum antibiotics.  Follow blood cultures for growth and sensitivities.  Mental status changes have resolved 2.  Acute on chronic kidney injury: Likely secondary to obstruction (BPH).  The patient now has an indwelling catheter.  Monitor ins and outs and follow BUN/creatinine for expected improvement in GFR.  Avoid nephrotoxic agents.  Hydrate with intravenous fluid. 3.  Hypertension: Controlled; continue losartan, metoprolol, clonidine and Spironolactone 4.  BPH: Continue tamsulosin 5.  Hypothyroidism: Check TSH; continue Synthroid 6.  Hyperlipidemia: Continue statin therapy 7.  DVT prophylaxis: Heparin 8.  GI prophylaxis: PPI per home regimen The patient is a full code.  Time spent on admission orders and patient care approximately 45 minutes  Harrie Foreman, MD 11/19/2018, 7:35 AM

## 2018-11-19 NOTE — ED Notes (Signed)
Lactic acid sent to lab due to notification from sepsis team

## 2018-11-19 NOTE — Progress Notes (Signed)
[facility] [place] NAME: Bobby Lane    MR#:  [id number]  DATE OF BIRTH:  1940/05/03  SUBJECTIVE:  CHIEF COMPLAINT: Patient was seen and examined more awake and alert and answering questions appropriately but feeling tired .  Still febrile  REVIEW OF SYSTEMS:  CONSTITUTIONAL: has  fever, no fatigue or weakness.  EYES: No blurred or double vision.  EARS, NOSE, AND THROAT: No tinnitus or ear pain.  RESPIRATORY: No cough, shortness of breath, wheezing or hemoptysis.  CARDIOVASCULAR: No chest pain, orthopnea, edema.  GASTROINTESTINAL: No nausea, vomiting, diarrhea or abdominal pain.  GENITOURINARY: No dysuria, hematuria.  ENDOCRINE: No polyuria, nocturia,  HEMATOLOGY: No anemia, easy bruising or bleeding SKIN: No rash or lesion. MUSCULOSKELETAL: No joint pain or arthritis.   NEUROLOGIC: No tingling, numbness, weakness.  PSYCHIATRY: No anxiety or depression.   DRUG ALLERGIES:   Allergies  Allergen Reactions  . Codeine Nausea And Vomiting  . Morphine And Related Nausea And Vomiting  . Novocain [Procaine] Hives    NO TROUBLE IN OR  . Other Nausea And Vomiting  . Percocet [Oxycodone-Acetaminophen] Nausea And Vomiting  . Sulfa Antibiotics Hives  . Tramadol Nausea And Vomiting    Note: upset stomach Note: upset stomach     VITALS:  Blood pressure (!) 144/69, pulse (!) 109, temperature (!) 100.5 F (38.1 C), temperature source Oral, resp. rate (!) 24, weight 92.4 kg, SpO2 96 %.  PHYSICAL EXAMINATION:  GENERAL:  78 y.o.-year-old patient lying in the bed with no acute distress.  EYES: Pupils equal, round, reactive to light and accommodation. No scleral icterus. Extraocular muscles intact.  HEENT: Head atraumatic, normocephalic. Oropharynx and nasopharynx clear.  NECK:  Supple, no jugular venous distention. No thyroid enlargement, no tenderness.  LUNGS: Normal breath sounds bilaterally, no wheezing, rales,rhonchi or crepitation.  No use of accessory muscles of respiration.  CARDIOVASCULAR: S1, S2 normal. No murmurs, rubs, or gallops.  ABDOMEN: Soft, nontender, nondistended. Bowel sounds present.  EXTREMITIES: No pedal edema, cyanosis, or clubbing.  NEUROLOGIC: Cranial nerves II through XII are intact. Muscle strength weak  in all extremities. Sensation intact. Gait not checked.  PSYCHIATRIC: The patient is alert and oriented x 3.  SKIN: No obvious rash, lesion, or ulcer.    LABORATORY PANEL:   CBC Recent Labs  Lab 11/19/18 0328  WBC 26.6*  HGB 12.4*  HCT 37.3*  PLT 295   ------------------------------------------------------------------------------------------------------------------  Chemistries  Recent Labs  Lab 11/19/18 0328  NA 136  K 4.2  CL 101  CO2 23  GLUCOSE 131*  BUN 46*  CREATININE 1.63*  CALCIUM 9.2  AST 18  ALT 13  ALKPHOS 60  BILITOT 0.9   ------------------------------------------------------------------------------------------------------------------  Cardiac Enzymes No results for input(s): TROPONINI in the last 168 hours. ------------------------------------------------------------------------------------------------------------------  RADIOLOGY:  Ct Angio Chest Pe W/cm &/or Wo Cm  Result Date: 11/19/2018 CLINICAL DATA:  Hypoxia with new onset atrial fibrillation.  Fever. EXAM: CT ANGIOGRAPHY CHEST WITH CONTRAST TECHNIQUE: Multidetector CT imaging of the chest was performed using the standard protocol during bolus administration of intravenous contrast. Multiplanar CT image reconstructions and MIPs were obtained to evaluate the vascular anatomy. CONTRAST:  68m OMNIPAQUE IOHEXOL 350 MG/ML SOLN COMPARISON:  None. FINDINGS: Cardiovascular: Limited study primarily due to bolus dispersion and intermittent respiratory motion. No evidence of central or lobar pulmonary embolism. No acute aortic finding. There is multifocal atherosclerotic calcification of the aorta and coronaries.  Normal heart size. No pericardial effusion Mediastinum/Nodes: Negative  for adenopathy or mass Lungs/Pleura: There is no edema, consolidation, effusion, or pneumothorax. Mild dependent atelectasis. Upper Abdomen: Negative Musculoskeletal: Spondylosis and generalized degenerative disease. 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 Osteopenia Review of the MIP images confirms the above findings. IMPRESSION: 1. Very limited pulmonary artery evaluation. No evidence of main or lobar pulmonary embolism. 2. Aortic and coronary atherosclerosis. Electronically Signed   By: Monte Fantasia M.D.   On: 11/19/2018 06:08   Dg Chest Port 1 View  Result Date: 11/19/2018 CLINICAL DATA:  Hypoxia EXAM: PORTABLE CHEST 1 VIEW COMPARISON:  Six days ago FINDINGS: Normal heart size and mediastinal contours. There is no edema, consolidation, effusion, or pneumothorax. No acute osseous finding. Severe glenohumeral osteoarthritis. IMPRESSION: No evidence of active disease. Electronically Signed   By: Monte Fantasia M.D.   On: 11/19/2018 04:50    EKG:   Orders placed or performed during the hospital encounter of 11/19/18  . ED EKG  . ED EKG  . EKG 12-Lead  . EKG 12-Lead  . EKG 12-Lead  . EKG 12-Lead    ASSESSMENT AND PLAN:   This is a 78 year old male admitted for sepsis. 1.  Sepsis: The patient met  criteria via tachycardia, tachypnea, leukocytosis and fever.   The patient's lactic acid is also elevated.  Monitor lactic acid levels 1.8--2.9 -1.4  He is hemodynamically stable.   Hydrate aggressively with intravenous fluid.   UA from this encounter is clear.  Continue broad-spectrum antibiotics.  Follow blood cultures for growth and sensitivities.   Mentation is improving Check a.m. labs 2.  Acute on chronic kidney injury: Likely secondary to obstruction (BPH).  The patient now has an indwelling catheter.   Monitor ins and outs and follow BUN/creatinine for expected improvement in GFR.   Avoid nephrotoxic  agents.  Hydrate with intravenous fluid. Baseline creatinine 1.3 -creatinine 1.63 3.  Hypertension: Controlled; continue losartan, metoprolol, clonidine and Spironolactone 4.  BPH: Continue tamsulosin 5.  Hypothyroidism: Check TSH; continue Synthroid 6.  Hyperlipidemia: Continue statin therapy 7.  DVT prophylaxis: Heparin 8.  GI prophylaxis: PPI per home regimen     All the records are reviewed and case discussed with Care Management/Social Workerr. Management plans discussed with the patient, family and they are in agreement.  CODE STATUS:   TOTAL TIME TAKING CARE OF THIS PATIENT: 35 minutes.   POSSIBLE D/C IN 2  DAYS, DEPENDING ON CLINICAL CONDITION.  Note: This dictation was prepared with Dragon dictation along with smaller phrase technology. Any transcriptional errors that result from this process are unintentional.   Nicholes Mango M.D on 11/19/2018 at 12:50 PM  Between 7am to 6pm - Pager - [phone] After 6pm go to [email or web address] - password [email or web address] [facility]  [facility]  Office  [phone]  CC: Primary care physician; Danelle Berry, NP

## 2018-11-19 NOTE — Progress Notes (Signed)
PHARMACY -  BRIEF ANTIBIOTIC NOTE   Pharmacy has received consult(s) for vancomycin from an ED provider.  The patient's profile has been reviewed for ht/wt/allergies/indication/available labs.    One time order(s) placed for vancomycin  Further antibiotics/pharmacy consults should be ordered by admitting physician if indicated.                       Thank you,  Tobie Lords, PharmD, BCPS Clinical Pharmacist 11/19/2018  4:06 AM

## 2018-11-19 NOTE — ED Provider Notes (Addendum)
[facility] Emergency Department Provider Note   ____________________________________________   First MD Initiated Contact with Patient 11/19/18 [phone]     (approximate)  I have reviewed the triage vital signs and the nursing notes.   HISTORY  Chief Complaint Frequent falls   HPI Bobby Lane is a 78 y.o. male brought to the ED from home via EMS with a chief complaint of UTI, generalized weakness, frequent falls and hypoxia.  Patient was seen in the ED at the beginning of the month, diagnosed with UTI and discharged home on Keflex.  This was changed to Augmentin just yesterday.  Wife told EMS since the change to amoxicillin patient has seemed off from his baseline and fell several times.  EMS reports room air saturations of 89% on their arrival.  Patient does not wear chronic oxygen.  Patient denies fever, chest pain, shortness of breath, abdominal pain, nausea or vomiting.       Past Medical History:  Diagnosis Date  . Arthritis   . Cancer ([place])    SKIN  . COPD (chronic obstructive pulmonary disease) ([place])   . Coronary artery disease   . Dyspnea    DOE  . GERD (gastroesophageal reflux disease)   . Gout   . Heart murmur   . History of hiatal hernia   . History of kidney stones   . History of wheezing   . HOH (hard of hearing)   . Hypertension   . Hypothyroidism   . Oxygen deficit    PRN USE  . Renal disorder   . Thyroid disease     Patient Active Problem List   Diagnosis Date Noted  . Pressure injury of skin 11/20/2018  . Sepsis ([place]) 11/19/2018  . Pain due to onychomycosis of toenails of both feet 08/18/2018  . Fatigue 06/05/2013  . Bradycardia 06/05/2013  . Hypertension   . Gout   . Thyroid disease   . Coronary artery disease   . Renal disorder     Past Surgical History:  Procedure Laterality Date  . CATARACT EXTRACTION W/PHACO Right 08/21/2016   Procedure: CATARACT EXTRACTION PHACO AND INTRAOCULAR LENS PLACEMENT (IOC);   Surgeon: Birder Robson, MD;  Location: [facility] ORS;  Service: Ophthalmology;  Laterality: Right;  [place] 00:54.9 AP% 18.3 CDE 10.04 Fluid pack lot # [id number]  . CATARACT EXTRACTION W/PHACO Left 09/18/2016   Procedure: CATARACT EXTRACTION PHACO AND INTRAOCULAR LENS PLACEMENT (IOC);  Surgeon: Birder Robson, MD;  Location: [facility] ORS;  Service: Ophthalmology;  Laterality: Left;  [place] 00:56 AP% 19.7 CDE 11.07 Fluid pack lot # [id number]  . FRACTURE SURGERY     ANKLE  . HERNIA REPAIR    . UPPER GI ENDOSCOPY      Prior to Admission medications   Medication Sig Start Date End Date Taking? Authorizing Provider  allopurinol (ZYLOPRIM) 300 MG tablet Take 300 mg by mouth daily.   Yes [provider]  amoxicillin-clavulanate (AUGMENTIN) 875-125 MG tablet Take 1 tablet by mouth every 12 (twelve) hours. 11/18/18  Yes Billey Co, MD  cetirizine (ZYRTEC) 10 MG tablet Take 10 mg by mouth at bedtime.  06/09/18  Yes [provider]  Cholecalciferol (VITAMIN D3) 5000 UNITS TABS Take 5,000 Units by mouth daily.   Yes [provider]  clonazePAM (KLONOPIN) 0.5 MG tablet Take 0.5 mg by mouth at bedtime as needed for anxiety.  09/08/18  Yes [provider]  cloNIDine (CATAPRES) 0.2 MG tablet Take 0.1 mg by mouth 2 (two)  times daily.   Yes [provider]  DULoxetine (CYMBALTA) 30 MG capsule Take 30 mg by mouth 2 (two) times daily.    Yes [provider]  losartan (COZAAR) 100 MG tablet Take 100 mg by mouth daily.   Yes [provider]  metoprolol succinate (TOPROL-XL) 100 MG 24 hr tablet Take 100 mg by mouth daily. 06/10/18  Yes [provider]  pantoprazole (PROTONIX) 40 MG tablet Take 40 mg by mouth daily.   Yes [provider]  rosuvastatin (CRESTOR) 5 MG tablet Take 5 mg by mouth every other day. 09/08/18  Yes [provider]  spironolactone (ALDACTONE) 25 MG tablet Take 25 mg by mouth daily.   Yes [provider]   sucralfate (CARAFATE) 1 g tablet Take 1 g by mouth 4 (four) times daily. 10/10/18  Yes [provider]  SYNTHROID 100 MCG tablet Take 100 mcg by mouth daily before breakfast.  08/07/18  Yes [provider]  tamsulosin (FLOMAX) 0.4 MG CAPS capsule Take 1 capsule (0.4 mg total) by mouth daily. 05/21/17  Yes Stoioff, Ronda Fairly, MD  metroNIDAZOLE (FLAGYL) 500 MG tablet  03/31/18   [provider]  sodium citrate-citric acid (ORACIT) 500-334 MG/5ML solution Take 30 mLs by mouth 2 (two) times daily after a meal. Patient not taking: Reported on 11/19/2018 10/08/17   Stoioff, Ronda Fairly, MD  valACYclovir (VALTREX) 1000 MG tablet TAKE 2 TABLETS BY MOUTH FOR 1 DOSE NOW THEN REPEAT 2 TABLETS IN 12 HOURS 07/16/18   [provider]    Allergies Codeine, Morphine and related, Novocain [procaine], Other, Percocet [oxycodone-acetaminophen], Sulfa antibiotics, and Tramadol  Family History  Problem Relation Age of Onset  . Prostate cancer Neg Hx   . Bladder Cancer Neg Hx   . Kidney cancer Neg Hx     Social History Social History   Tobacco Use  . Smoking status: Never Smoker  . Smokeless tobacco: Never Used  Substance Use Topics  . Alcohol use: No  . Drug use: No    Review of Systems  Constitutional: Positive for generalized weakness and frequent falls.  No fever/chills Eyes: No visual changes. ENT: No sore throat. Cardiovascular: Denies chest pain. Respiratory: Denies shortness of breath. Gastrointestinal: No abdominal pain.  No nausea, no vomiting.  No diarrhea.  No constipation. Genitourinary: Negative for dysuria. Musculoskeletal: Negative for back pain. Skin: Negative for rash. Neurological: Negative for headaches, focal weakness or numbness.   ____________________________________________   PHYSICAL EXAM:  VITAL SIGNS: ED Triage Vitals  Enc Vitals Group     BP      Pulse      Resp      Temp      Temp src      SpO2      Weight      Height      Head  Circumference      Peak Flow      Pain Score      Pain Loc      Pain Edu?      Excl. in [place]?     Constitutional: Alert and oriented.  Elderly appearing and in mild acute distress. Eyes: Conjunctivae are normal. PERRL. EOMI. Head: Atraumatic. Nose: No congestion/rhinnorhea. Mouth/Throat: Mucous membranes are moist.  Oropharynx non-erythematous. Neck: No stridor.   Cardiovascular: Tachycardic rate, irregular rhythm. Grossly normal heart sounds.  Good peripheral circulation. Respiratory: Increased respiratory effort.  No retractions. Lungs with faint by basilar rales. Gastrointestinal: Soft and nontender. No distention.  No abdominal bruits. No CVA tenderness. Musculoskeletal: No lower extremity tenderness nor edema.  No joint effusions. Neurologic:  Normal speech and language. No gross focal neurologic deficits are appreciated.  Skin:  Skin is warm, clammy and intact. No rash noted. Psychiatric: Mood and affect are normal. Speech and behavior are normal.  ____________________________________________   LABS (all labs ordered are listed, but only abnormal results are displayed)  Labs Reviewed  C DIFFICILE QUICK SCREEN W PCR REFLEX - Abnormal; Notable for the following components:      Result Value   C Diff antigen POSITIVE (*)    All other components within normal limits  CLOSTRIDIUM DIFFICILE BY PCR, REFLEXED - Abnormal; Notable for the following components:   Toxigenic C. Difficile by PCR POSITIVE (*)    All other components within normal limits  CBC WITH DIFFERENTIAL/PLATELET - Abnormal; Notable for the following components:   WBC 26.6 (*)    RBC 3.62 (*)    Hemoglobin 12.4 (*)    HCT 37.3 (*)    MCV 103.0 (*)    MCH 34.3 (*)    Neutro Abs 24.3 (*)    Monocytes Absolute 1.2 (*)    Abs Immature Granulocytes 0.55 (*)    All other components within normal limits  COMPREHENSIVE METABOLIC PANEL - Abnormal; Notable for the following components:   Glucose, Bld 131 (*)    BUN  46 (*)    Creatinine, Ser 1.63 (*)    Albumin 3.4 (*)    GFR calc non Af Amer 40 (*)    GFR calc Af Amer 47 (*)    All other components within normal limits  LACTIC ACID, PLASMA - Abnormal; Notable for the following components:   Lactic Acid, Venous 2.9 (*)    All other components within normal limits  URINALYSIS, COMPLETE (UACMP) WITH MICROSCOPIC - Abnormal; Notable for the following components:   Color, Urine YELLOW (*)    APPearance HAZY (*)    Ketones, ur 5 (*)    All other components within normal limits  CBC WITH DIFFERENTIAL/PLATELET - Abnormal; Notable for the following components:   WBC 24.3 (*)    RBC 3.29 (*)    Hemoglobin 11.1 (*)    HCT 34.2 (*)    MCV 104.0 (*)    Neutro Abs 21.7 (*)    Abs Immature Granulocytes 0.84 (*)    All other components within normal limits  BASIC METABOLIC PANEL - Abnormal; Notable for the following components:   Glucose, Bld 127 (*)    BUN 26 (*)    Calcium 7.9 (*)    GFR calc non Af Amer 57 (*)    Anion gap 4 (*)    All other components within normal limits  LACTIC ACID, PLASMA - Abnormal; Notable for the following components:   Lactic Acid, Venous 2.0 (*)    All other components within normal limits  CBC WITH DIFFERENTIAL/PLATELET - Abnormal; Notable for the following components:   WBC 26.8 (*)    RBC 3.19 (*)    Hemoglobin 10.9 (*)    HCT 32.9 (*)    MCV 103.1 (*)    MCH 34.2 (*)    Neutro Abs 24.6 (*)    Monocytes Absolute 1.1 (*)    Abs Immature Granulocytes 0.23 (*)    All other components within normal limits  CREATININE, SERUM - Abnormal; Notable for the following components:   GFR calc non Af Amer 59 (*)    All other components within  normal limits  BLOOD GAS, ARTERIAL - Abnormal; Notable for the following components:   pCO2 arterial 27 (*)    pO2, Arterial 79 (*)    Bicarbonate 18.3 (*)    Acid-base deficit 4.4 (*)    All other components within normal limits  CBC WITH DIFFERENTIAL/PLATELET - Abnormal; Notable for  the following components:   WBC 29.6 (*)    RBC 3.20 (*)    Hemoglobin 11.0 (*)    HCT 32.9 (*)    MCV 102.8 (*)    MCH 34.4 (*)    Neutro Abs 26.2 (*)    Abs Immature Granulocytes 1.16 (*)    All other components within normal limits  COMPREHENSIVE METABOLIC PANEL - Abnormal; Notable for the following components:   CO2 21 (*)    Glucose, Bld 118 (*)    BUN 25 (*)    Calcium 7.6 (*)    Total Protein 4.9 (*)    Albumin 1.9 (*)    All other components within normal limits  PHOSPHORUS - Abnormal; Notable for the following components:   Phosphorus 2.2 (*)    All other components within normal limits  CBC - Abnormal; Notable for the following components:   WBC 23.6 (*)    RBC 3.11 (*)    Hemoglobin 10.5 (*)    HCT 32.0 (*)    MCV 102.9 (*)    All other components within normal limits  COMPREHENSIVE METABOLIC PANEL - Abnormal; Notable for the following components:   CO2 20 (*)    Glucose, Bld 106 (*)    BUN 29 (*)    Calcium 7.7 (*)    Total Protein 4.5 (*)    Albumin 1.9 (*)    All other components within normal limits  TROPONIN I (HIGH SENSITIVITY) - Abnormal; Notable for the following components:   Troponin I (High Sensitivity) 28 (*)    All other components within normal limits  TROPONIN I (HIGH SENSITIVITY) - Abnormal; Notable for the following components:   Troponin I (High Sensitivity) 35 (*)    All other components within normal limits  CULTURE, BLOOD (ROUTINE X 2)  CULTURE, BLOOD (ROUTINE X 2)  URINE CULTURE  SARS CORONAVIRUS 2 (HOSPITAL ORDER, [place] [facility])  RESPIRATORY PANEL BY PCR  MRSA PCR SCREENING  GI PATHOGEN PANEL BY PCR, STOOL  LACTIC ACID, PLASMA  VANCOMYCIN, RANDOM  TSH  HEMOGLOBIN A1C  TSH  VANCOMYCIN, RANDOM  LACTIC ACID, PLASMA  PROCALCITONIN  PROCALCITONIN  LACTIC ACID, PLASMA  LACTIC ACID, PLASMA  PROCALCITONIN  TSH  MAGNESIUM  NOROVIRUS GROUP 1 & 2 BY PCR, STOOL  CBC  BASIC METABOLIC PANEL    ____________________________________________  EKG  ED ECG REPORT I, SUNG,JADE J, the attending physician, personally viewed and interpreted this ECG.   Date: 11/19/2018  EKG Time: 0328  Rate: 126  Rhythm: atrial fibrillation, rate 126  Axis: Normal  Intervals:none  ST&T Change: Nonspecific  ____________________________________________  RADIOLOGY  ED MD interpretation: No acute cardiopulmonary process; no PE  Official radiology report(s): Dg Abd 1 View  Result Date: 11/23/2018 CLINICAL DATA:  Severe cdiff colitis with colonic distension. Eval for ileus. Pt actively having episodes of diarrhea during the exam. EXAM: ABDOMEN - 1 VIEW COMPARISON:  Abdominal radiograph 11/22/2018 FINDINGS: There are dilated loops of small and large bowel throughout the abdomen likely representing diffuse ileus. The transverse colon is significantly dilated measuring 9 cm in diameter. Health stromal markings are maintained. There is evidence of  thumbprinting likely from mucosal edema. No evidence for free air on supine view. No acute finding in the visualized skeleton. IMPRESSION: Findings consistent with diffuse ileus. No gross free air. 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 Electronically Signed   By: Audie Pinto M.D.   On: 11/23/2018 10:15    ____________________________________________   PROCEDURES  Procedure(s) performed (including Critical Care):  Procedures  CRITICAL CARE Performed by: Paulette Blanch   Total critical care time: 45 minutes  Critical care time was exclusive of separately billable procedures and treating other patients.  Critical care was necessary to treat or prevent imminent or life-threatening deterioration.  Critical care was time spent personally by me on the following activities: development of treatment plan with patient and/or surrogate as well as nursing, discussions  with consultants, evaluation of patient's response to treatment, examination of patient, obtaining history from patient or surrogate, ordering and performing treatments and interventions, ordering and review of laboratory studies, ordering and review of radiographic studies, pulse oximetry and re-evaluation of patient's condition.  ____________________________________________   INITIAL IMPRESSION / ASSESSMENT AND PLAN / ED COURSE  As part of my medical decision making, I reviewed the following data within the [place] notes reviewed and incorporated, Labs reviewed, EKG interpreted, Old chart reviewed, Radiograph reviewed and Notes from prior ED visits     Bobby Lane was evaluated in Emergency Department on 11/24/2018 for the symptoms described in the history of present illness. He was evaluated in the context of the global COVID-19 pandemic, which necessitated consideration that the patient might be at risk for infection with the SARS-CoV-2 virus that causes COVID-19. Institutional protocols and algorithms that pertain to the evaluation of patients at risk for COVID-19 are in a state of rapid change based on information released by regulatory bodies including the CDC and federal and state organizations. These policies and algorithms were followed during the patient's care in the ED.    78 year old male on amoxicillin for UTI presenting with generalized weakness, frequent falls, altered mentation and hypoxia.  Differential diagnosis includes but is not limited to sepsis, UTI, CAP, COVID-19, etc.  Will obtain sepsis work-up, COVID-19 swab, chest x-ray.  Initiate IV fluid resuscitation, check rectal temperature and reassess.   Clinical Course as of Nov 24 11  Wed Nov 19, 2018  0333 Reviewed patient's old records and EKG; atrial fibrillation with RVR appears new.   [JS]  0346 Heart rate 120s to 130s, hemodynamically stable.  Will administer small boluses of IV  Cardizem.   [JS]  0357 Rectal temperature 102.4 F.  Code sepsis initiated.  Tylenol administered.   [JS]  [id number] Heart rate creeping back up to 120s.  Will give second IV Cardizem bolus.  Will discuss with hospitalist Dr. Marcille Blanco to evaluate patient in the emergency department for admission.   [JS]  [id number] Heart rate 90s after second dose of IV Cardizem.   [JS]    Clinical Course User Index [JS] Paulette Blanch, MD     ____________________________________________   FINAL CLINICAL IMPRESSION(S) / ED DIAGNOSES  Final diagnoses:  Weakness generalized  Hypoxia  Atrial fibrillation with rapid ventricular response (HCC)  Fever, unspecified fever cause  Sepsis, due to unspecified organism, unspecified whether acute organ dysfunction present [facility])     ED Discharge Orders    None       Note:  This document was prepared using Dragon voice recognition software and  may include unintentional dictation errors.   Paulette Blanch, MD 11/19/18 [id number]    Paulette Blanch, MD 11/24/18 [phone]

## 2018-11-19 NOTE — Progress Notes (Signed)
CODE SEPSIS - PHARMACY COMMUNICATION  **Broad Spectrum Antibiotics should be administered within 1 hour of Sepsis diagnosis**  Time Code Sepsis Called/Page Received: 0404  Antibiotics Ordered: vanc/cefepime/flagyl  Time of 1st antibiotic administration: [id number]  Additional action taken by pharmacy:   If necessary, Name of Provider/Nurse Contacted:     Tobie Lords ,PharmD Clinical Pharmacist  11/19/2018  5:51 AM

## 2018-11-19 NOTE — Progress Notes (Signed)
Pharmacy Antibiotic Note  Bobby Lane is a 78 y.o. male admitted on 11/19/2018 with sepsis s/t UTI w/ h/o bladder incontinence.  Pharmacy has been consulted for vancomycin/cefepime dosing. Patient appears to have a h/o of CKD per his baseline Scr being anywhere from 1.3 - 1.6 and appears to be either in AKI on CKD or may just be at his baseline (wide variation of Scr trend).  Plan: Pt received vancomycin 2000 mg x 1 in the ED. Will give 1250 mg dose x 1. Will order a 12 hour level.  Will dose vanc 15 mg/kg if random level is below that threshold.  Will continue cefepime 2g IV q12h per CrCl 30 - 60 ml/min and flagyl 500 mg IV q8h. Will continue to monitor renal function and s/sx of infection.  Height: 5\' 7"  (170.2 cm) Weight: 193 lb 2 oz (87.6 kg) IBW/kg (Calculated) : 66.1  Temp (24hrs), Avg:99.5 F (37.5 C), Min:97.7 F (36.5 C), Max:100.5 F (38.1 C)  Recent Labs  Lab 11/13/18 2300 11/19/18 0328 11/19/18 0737 11/19/18 1537  WBC 10.1 26.6*  --   --   CREATININE 1.45* 1.63*  --   --   LATICACIDVEN 1.8 2.9* 1.4  --   VANCORANDOM  --   --   --  14    Estimated Creatinine Clearance: 40.7 mL/min (A) (by C-G formula based on SCr of 1.63 mg/dL (H)).    Allergies  Allergen Reactions  . Codeine Nausea And Vomiting  . Morphine And Related Nausea And Vomiting  . Novocain [Procaine] Hives    NO TROUBLE IN OR  . Other Nausea And Vomiting  . Percocet [Oxycodone-Acetaminophen] Nausea And Vomiting  . Sulfa Antibiotics Hives  . Tramadol Nausea And Vomiting    Note: upset stomach Note: upset stomach     Thank you for allowing pharmacy to be a part of this patient's care.  Eleonore Chiquito, PharmD, BCPS Clinical Pharmacist 11/19/2018 5:26 PM

## 2018-11-19 NOTE — ED Notes (Signed)
Pt weaned to room air. Pt maintaining sats at 100% RA.

## 2018-11-19 NOTE — Sepsis Progress Note (Signed)
Notified bedside nurse of need to draw repeat lactic acid. 

## 2018-11-19 NOTE — ED Notes (Signed)
BLADDER SCAN  650 ml

## 2018-11-19 NOTE — ED Triage Notes (Signed)
Pt arrived via EMS from home where wife reported pt recently diagnosed with bladder infection and has been on antibiotics. Per wife, pt has had AMS as well as incontinence since diagnoses. Pt has temp 102.8F on arrival, tachy and altered from reported baseline. Pts O2 is 88% on RA. Pt placed on 2L O2. No chronic O2 use reported.

## 2018-11-19 NOTE — ED Notes (Signed)
Report given to floor RN

## 2018-11-19 NOTE — ED Notes (Signed)
Pt transported to room 246.

## 2018-11-20 ENCOUNTER — Inpatient Hospital Stay: Payer: Medicare HMO

## 2018-11-20 DIAGNOSIS — L8990 Pressure ulcer of unspecified site, unspecified stage: Secondary | ICD-10-CM | POA: Insufficient documentation

## 2018-11-20 LAB — CBC WITH DIFFERENTIAL/PLATELET
Abs Immature Granulocytes: 0.84 10*3/uL — ABNORMAL HIGH (ref 0.00–0.07)
Basophils Absolute: 0.1 10*3/uL (ref 0.0–0.1)
Basophils Relative: 0 %
Eosinophils Absolute: 0 10*3/uL (ref 0.0–0.5)
Eosinophils Relative: 0 %
HCT: 34.2 % — ABNORMAL LOW (ref 39.0–52.0)
Hemoglobin: 11.1 g/dL — ABNORMAL LOW (ref 13.0–17.0)
Immature Granulocytes: 4 %
Lymphocytes Relative: 4 %
Lymphs Abs: 0.9 10*3/uL (ref 0.7–4.0)
MCH: 33.7 pg (ref 26.0–34.0)
MCHC: 32.5 g/dL (ref 30.0–36.0)
MCV: 104 fL — ABNORMAL HIGH (ref 80.0–100.0)
Monocytes Absolute: 0.9 10*3/uL (ref 0.1–1.0)
Monocytes Relative: 4 %
Neutro Abs: 21.7 10*3/uL — ABNORMAL HIGH (ref 1.7–7.7)
Neutrophils Relative %: 88 %
Platelets: 257 10*3/uL (ref 150–400)
RBC: 3.29 MIL/uL — ABNORMAL LOW (ref 4.22–5.81)
RDW: 14.4 % (ref 11.5–15.5)
WBC: 24.3 10*3/uL — ABNORMAL HIGH (ref 4.0–10.5)
nRBC: 0 % (ref 0.0–0.2)

## 2018-11-20 LAB — BLOOD GAS, ARTERIAL
Acid-base deficit: 4.4 mmol/L — ABNORMAL HIGH (ref 0.0–2.0)
Bicarbonate: 18.3 mmol/L — ABNORMAL LOW (ref 20.0–28.0)
FIO2: 0.21
O2 Saturation: 96 %
Patient temperature: 37
pCO2 arterial: 27 mmHg — ABNORMAL LOW (ref 32.0–48.0)
pH, Arterial: 7.44 (ref 7.350–7.450)
pO2, Arterial: 79 mmHg — ABNORMAL LOW (ref 83.0–108.0)

## 2018-11-20 LAB — TSH: TSH: 0.986 u[IU]/mL (ref 0.350–4.500)

## 2018-11-20 LAB — BASIC METABOLIC PANEL
Anion gap: 4 — ABNORMAL LOW (ref 5–15)
BUN: 26 mg/dL — ABNORMAL HIGH (ref 8–23)
CO2: 23 mmol/L (ref 22–32)
Calcium: 7.9 mg/dL — ABNORMAL LOW (ref 8.9–10.3)
Chloride: 110 mmol/L (ref 98–111)
Creatinine, Ser: 1.22 mg/dL (ref 0.61–1.24)
GFR calc Af Amer: >60 mL/min (ref >60)
GFR calc non Af Amer: 57 mL/min — ABNORMAL LOW (ref >60)
Glucose, Bld: 127 mg/dL — ABNORMAL HIGH (ref 70–99)
Potassium: 3.8 mmol/L (ref 3.5–5.1)
Sodium: 137 mmol/L (ref 135–145)

## 2018-11-20 LAB — RESPIRATORY PANEL BY PCR

## 2018-11-20 LAB — URINE CULTURE: Culture: NO GROWTH

## 2018-11-20 LAB — LACTIC ACID, PLASMA
Lactic Acid, Venous: 1.3 mmol/L (ref 0.5–1.9)
Lactic Acid, Venous: 2 mmol/L (ref 0.5–1.9)

## 2018-11-20 LAB — VANCOMYCIN, RANDOM: Vancomycin Rm: 16

## 2018-11-20 LAB — PROCALCITONIN: Procalcitonin: 0.78 ng/mL

## 2018-11-20 LAB — MRSA PCR SCREENING: MRSA by PCR: NEGATIVE

## 2018-11-20 MED ORDER — VANCOMYCIN HCL IN DEXTROSE 1-5 GM/200ML-% IV SOLN
1000.0000 mg | INTRAVENOUS | Status: DC
  Administered 2018-11-20: 1000 mg via INTRAVENOUS
  Filled 2018-11-20 (×2): qty 200

## 2018-11-20 MED ORDER — PIPERACILLIN-TAZOBACTAM 3.375 G IVPB
3.3750 g | Freq: Three times a day (TID) | INTRAVENOUS | Status: DC
  Administered 2018-11-20 – 2018-11-21 (×4): 3.375 g via INTRAVENOUS
  Filled 2018-11-20 (×3): qty 50

## 2018-11-20 MED ORDER — ALBUTEROL SULFATE (2.5 MG/3ML) 0.083% IN NEBU
2.5000 mg | INHALATION_SOLUTION | RESPIRATORY_TRACT | Status: DC | PRN
  Administered 2018-11-20: 2.5 mg via RESPIRATORY_TRACT
  Filled 2018-11-20: qty 3

## 2018-11-20 MED ORDER — METRONIDAZOLE IN NACL 5-0.79 MG/ML-% IV SOLN
500.0000 mg | Freq: Three times a day (TID) | INTRAVENOUS | Status: DC
  Administered 2018-11-20: 500 mg via INTRAVENOUS
  Filled 2018-11-20 (×3): qty 100

## 2018-11-20 NOTE — Progress Notes (Signed)
Talked to Dr. Posey Pronto about patient being drowsy throughout the shift. Patient is arousable but drift back to sleep during conversation, asked if we can check his ABG or need to do imaging. Also patient is confused, which he came in for AMS. Orders given to check ABG. RN will continue to monitor.

## 2018-11-20 NOTE — Progress Notes (Signed)
[facility] [place] NAME: Bobby Lane    MR#:  [id number]  DATE OF BIRTH:  04-10-1940  SUBJECTIVE:  CHIEF COMPLAINT: Patient was seen and examined more awake and alert and answering few questions and falling asleep.  Has abdominal pain Patient is supposed to see urologist as an outpatient but could not make it and he received a p.o. Keflex one-time dose prior to the admission per son's report Patient does not drink alcohol according to the son REVIEW OF SYSTEMS:  CONSTITUTIONAL: has  fever, no fatigue or weakness.  EYES: No blurred or double vision.  EARS, NOSE, AND THROAT: No tinnitus or ear pain.  RESPIRATORY: No cough, shortness of breath, wheezing or hemoptysis.  CARDIOVASCULAR: No chest pain, orthopnea, edema.  GASTROINTESTINAL: No nausea, vomiting, diarrhea , reporting suprapubic abdominal pain.  HEMATOLOGY: No anemia, easy bruising or bleeding SKIN: No rash or lesion. MUSCULOSKELETAL: No joint pain or arthritis.   NEUROLOGIC: No tingling, numbness, weakness.  PSYCHIATRY: No anxiety or depression.   DRUG ALLERGIES:   Allergies  Allergen Reactions  . Codeine Nausea And Vomiting  . Morphine And Related Nausea And Vomiting  . Novocain [Procaine] Hives    NO TROUBLE IN OR  . Other Nausea And Vomiting  . Percocet [Oxycodone-Acetaminophen] Nausea And Vomiting  . Sulfa Antibiotics Hives  . Tramadol Nausea And Vomiting    Note: upset stomach Note: upset stomach     VITALS:  Blood pressure (!) 142/65, pulse 97, temperature 99.2 F (37.3 C), temperature source Oral, resp. rate 20, height _0  (1.702 m), weight 89.2 kg, SpO2 92 %.  PHYSICAL EXAMINATION:  GENERAL:  78 y.o.-year-old patient lying in the bed with no acute distress.  EYES: Pupils equal, round, reactive to light and accommodation. No scleral icterus. Extraocular muscles intact.  HEENT: Head atraumatic, normocephalic. Oropharynx and nasopharynx clear.  NECK:   Supple, no jugular venous distention. No thyroid enlargement, no tenderness.  LUNGS: Normal breath sounds bilaterally, no wheezing, rales,rhonchi or crepitation. No use of accessory muscles of respiration.  CARDIOVASCULAR: S1, S2 normal. No murmurs, rubs, or gallops.  ABDOMEN: Soft, midline tenderness is present no rebound tenderness  nondistended. Bowel sounds present.  EXTREMITIES: No pedal edema, cyanosis, or clubbing.  NEUROLOGIC: Cranial nerves II through XII are intact. Muscle strength weak  in all extremities. Sensation intact. Gait not checked.  PSYCHIATRIC: The patient is alert and oriented x 1-2 .  SKIN: No obvious rash, lesion, or ulcer.    LABORATORY PANEL:   CBC Recent Labs  Lab 11/20/18 0603  WBC 24.3*  HGB 11.1*  HCT 34.2*  PLT 257   ------------------------------------------------------------------------------------------------------------------  Chemistries  Recent Labs  Lab 11/19/18 0328 11/20/18 0603  NA 136 137  K 4.2 3.8  CL 101 110  CO2 23 23  GLUCOSE 131* 127*  BUN 46* 26*  CREATININE 1.63* 1.22  CALCIUM 9.2 7.9*  AST 18  --   ALT 13  --   ALKPHOS 60  --   BILITOT 0.9  --    ------------------------------------------------------------------------------------------------------------------  Cardiac Enzymes No results for input(s): TROPONINI in the last 168 hours. ------------------------------------------------------------------------------------------------------------------  RADIOLOGY:  Ct Angio Chest Pe W/cm &/or Wo Cm  Result Date: 11/19/2018 CLINICAL DATA:  Hypoxia with new onset atrial fibrillation.  Fever. EXAM: CT ANGIOGRAPHY CHEST WITH CONTRAST TECHNIQUE: Multidetector CT imaging of the chest was performed using the standard protocol during bolus administration of intravenous contrast. Multiplanar CT image reconstructions and MIPs were  obtained to evaluate the vascular anatomy. CONTRAST:  37m OMNIPAQUE IOHEXOL 350 MG/ML SOLN COMPARISON:   None. FINDINGS: Cardiovascular: Limited study primarily due to bolus dispersion and intermittent respiratory motion. No evidence of central or lobar pulmonary embolism. No acute aortic finding. There is multifocal atherosclerotic calcification of the aorta and coronaries. Normal heart size. No pericardial effusion Mediastinum/Nodes: Negative for adenopathy or mass Lungs/Pleura: There is no edema, consolidation, effusion, or pneumothorax. Mild dependent atelectasis. Upper Abdomen: Negative Musculoskeletal: Spondylosis and generalized degenerative disease. 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 Osteopenia Review of the MIP images confirms the above findings. IMPRESSION: 1. Very limited pulmonary artery evaluation. No evidence of main or lobar pulmonary embolism. 2. Aortic and coronary atherosclerosis. Electronically Signed   By: JMonte FantasiaM.D.   On: 11/19/2018 06:08   Dg Chest Port 1 View  Result Date: 11/19/2018 CLINICAL DATA:  Hypoxia EXAM: PORTABLE CHEST 1 VIEW COMPARISON:  Six days ago FINDINGS: Normal heart size and mediastinal contours. There is no edema, consolidation, effusion, or pneumothorax. No acute osseous finding. Severe glenohumeral osteoarthritis. IMPRESSION: No evidence of active disease. Electronically Signed   By: JMonte FantasiaM.D.   On: 11/19/2018 04:50    EKG:   Orders placed or performed during the hospital encounter of 11/19/18  . ED EKG  . ED EKG  . EKG 12-Lead  . EKG 12-Lead  . EKG 12-Lead  . EKG 12-Lead    ASSESSMENT AND PLAN:   This is a 78year old male admitted for sepsis. 1.  Sepsis: The patient met  criteria via tachycardia, tachypnea, leukocytosis and fever.   The patient's lactic acid is also elevated.  Monitor lactic acid levels 1.8--2.9 -1.4-1.3.  Procalcitonin 0.78  He is hemodynamically stable.   Hydrate aggressively with intravenous fluid.   UA from this encounter is clear.  Continue broad-spectrum antibiotics.  Blood cultures with no  growth so far.  Urine culture with no growth Check a.m. labs Sepsis etiology is unclear  #Acute abdominal pain CT abdomen and pelvis ordered.  If needed urology consult or GI consult will be placed  #Right upper extremity pain Right wrist and forearm x-rays are ordered  # Acute on chronic kidney injury: Likely secondary to obstruction (BPH).  The patient now has an indwelling catheter.   Monitor ins and outs and follow BUN/creatinine for expected improvement in GFR.   Avoid nephrotoxic agents.  Hydrate with intravenous fluid. Baseline creatinine 1.3 -creatinine 1.63  .  Hypertension: Controlled; continue losartan, metoprolol, clonidine and Spironolactone   BPH: Continue tamsulosin.  Hypothyroidism: Check TSH; continue Synthroid   Hyperlipidemia: Continue statin therapy   DVT prophylaxis: Heparin   GI prophylaxis: PPI per home regimen     All the records are reviewed and case discussed with Care Management/Social Workerr. Management plans discussed with the patient, family and they are in agreement.  CODE STATUS:   TOTAL TIME TAKING CARE OF THIS PATIENT: 35 minutes.   POSSIBLE D/C IN 2  DAYS, DEPENDING ON CLINICAL CONDITION.  Note: This dictation was prepared with Dragon dictation along with smaller phrase technology. Any transcriptional errors that result from this process are unintentional.   ANicholes MangoM.D on 11/20/2018 at 2:22 PM  Between 7am to 6pm - Pager - [phone] 6pm go to [email or web address] - password [email or web address] [facility] HallHospitalists  Office  [phone] CC: Primary care physician; BDanelle Berry NP

## 2018-11-20 NOTE — Care Management Important Message (Signed)
Important Message  Patient Details  Name: Bobby Lane MRN: [id number] Date of Birth: 1940-07-19   Medicare Important Message Given:  Yes  Initial Medicare IM given by Patient Access Associate on 11/20/2018 at 9:40am.     Dannette Barbara 11/20/2018, 3:18 PM

## 2018-11-20 NOTE — Progress Notes (Deleted)
Pharmacy Antibiotic Note  Bobby Lane is a 78 y.o. male admitted on 11/19/2018 with sepsis s/t UTI w/ h/o bladder incontinence.  Pharmacy has been consulted for vancomycin/cefepime dosing.  Baseline serum creatinine is not clear, so the patient was placed on variable renal dosing.  However, as serum, creatinine is improved today, true baseline may be between 1.2 and 1.4.  Pt received vancomycin 2000 mg in the ED.  Plan: Will start vancomycin 1 g q24 hours.  Goal AUC 400-550.  Expected: AUC 505 Css min 12.3  Will continue cefepime 2g IV q12h per CrCl 30 - 60 ml/min. Will continue to monitor renal function and s/sx of infection.  Height: 5\' 7"  (170.2 cm) Weight: 196 lb 11.2 oz (89.2 kg) IBW/kg (Calculated) : 66.1  Temp (24hrs), Avg:98.7 F (37.1 C), Min:97.7 F (36.5 C), Max:99.2 F (37.3 C)  Recent Labs  Lab 11/13/18 2300 11/19/18 0328 11/19/18 0737 11/19/18 1537 11/20/18 0603 11/20/18 1128  WBC 10.1 26.6*  --   --  24.3*  --   CREATININE 1.45* 1.63*  --   --  1.22  --   LATICACIDVEN 1.8 2.9* 1.4  --   --  1.3  VANCORANDOM  --   --   --  14 16  --     Estimated Creatinine Clearance: 54.9 mL/min (by C-G formula based on SCr of 1.22 mg/dL).    Allergies  Allergen Reactions  . Codeine Nausea And Vomiting  . Morphine And Related Nausea And Vomiting  . Novocain [Procaine] Hives    NO TROUBLE IN OR  . Other Nausea And Vomiting  . Percocet [Oxycodone-Acetaminophen] Nausea And Vomiting  . Sulfa Antibiotics Hives  . Tramadol Nausea And Vomiting    Note: upset stomach Note: upset stomach     Thank you for allowing pharmacy to be a part of this patient's care.  Gerald Dexter, PharmD Pharmacy Resident  11/20/2018 2:45 PM

## 2018-11-21 DIAGNOSIS — N179 Acute kidney failure, unspecified: Secondary | ICD-10-CM

## 2018-11-21 DIAGNOSIS — Z881 Allergy status to other antibiotic agents status: Secondary | ICD-10-CM

## 2018-11-21 DIAGNOSIS — I10 Essential (primary) hypertension: Secondary | ICD-10-CM

## 2018-11-21 DIAGNOSIS — E039 Hypothyroidism, unspecified: Secondary | ICD-10-CM

## 2018-11-21 DIAGNOSIS — Z960 Presence of urogenital implants: Secondary | ICD-10-CM

## 2018-11-21 DIAGNOSIS — I2510 Atherosclerotic heart disease of native coronary artery without angina pectoris: Secondary | ICD-10-CM

## 2018-11-21 DIAGNOSIS — N401 Enlarged prostate with lower urinary tract symptoms: Secondary | ICD-10-CM

## 2018-11-21 DIAGNOSIS — R300 Dysuria: Secondary | ICD-10-CM

## 2018-11-21 DIAGNOSIS — Z9181 History of falling: Secondary | ICD-10-CM

## 2018-11-21 DIAGNOSIS — Z885 Allergy status to narcotic agent status: Secondary | ICD-10-CM

## 2018-11-21 DIAGNOSIS — J449 Chronic obstructive pulmonary disease, unspecified: Secondary | ICD-10-CM

## 2018-11-21 DIAGNOSIS — Z87440 Personal history of urinary (tract) infections: Secondary | ICD-10-CM

## 2018-11-21 DIAGNOSIS — R350 Frequency of micturition: Secondary | ICD-10-CM

## 2018-11-21 DIAGNOSIS — Z87442 Personal history of urinary calculi: Secondary | ICD-10-CM

## 2018-11-21 DIAGNOSIS — R338 Other retention of urine: Secondary | ICD-10-CM

## 2018-11-21 DIAGNOSIS — Z884 Allergy status to anesthetic agent status: Secondary | ICD-10-CM

## 2018-11-21 DIAGNOSIS — A419 Sepsis, unspecified organism: Secondary | ICD-10-CM

## 2018-11-21 LAB — CREATININE, SERUM
Creatinine, Ser: 1.19 mg/dL (ref 0.61–1.24)
GFR calc Af Amer: >60 mL/min (ref >60)
GFR calc non Af Amer: 59 mL/min — ABNORMAL LOW (ref >60)

## 2018-11-21 LAB — CBC WITH DIFFERENTIAL/PLATELET
Abs Immature Granulocytes: 0.23 10*3/uL — ABNORMAL HIGH (ref 0.00–0.07)
Basophils Absolute: 0 10*3/uL (ref 0.0–0.1)
Basophils Relative: 0 %
Eosinophils Absolute: 0 10*3/uL (ref 0.0–0.5)
Eosinophils Relative: 0 %
HCT: 32.9 % — ABNORMAL LOW (ref 39.0–52.0)
Hemoglobin: 10.9 g/dL — ABNORMAL LOW (ref 13.0–17.0)
Immature Granulocytes: 1 %
Lymphocytes Relative: 3 %
Lymphs Abs: 0.8 10*3/uL (ref 0.7–4.0)
MCH: 34.2 pg — ABNORMAL HIGH (ref 26.0–34.0)
MCHC: 33.1 g/dL (ref 30.0–36.0)
MCV: 103.1 fL — ABNORMAL HIGH (ref 80.0–100.0)
Monocytes Absolute: 1.1 10*3/uL — ABNORMAL HIGH (ref 0.1–1.0)
Monocytes Relative: 4 %
Neutro Abs: 24.6 10*3/uL — ABNORMAL HIGH (ref 1.7–7.7)
Neutrophils Relative %: 92 %
Platelets: 245 10*3/uL (ref 150–400)
RBC: 3.19 MIL/uL — ABNORMAL LOW (ref 4.22–5.81)
RDW: 13.9 % (ref 11.5–15.5)
Smear Review: NORMAL
WBC: 26.8 10*3/uL — ABNORMAL HIGH (ref 4.0–10.5)
nRBC: 0 % (ref 0.0–0.2)

## 2018-11-21 LAB — C DIFFICILE QUICK SCREEN W PCR REFLEX
C Diff antigen: POSITIVE — AB
C Diff toxin: NEGATIVE

## 2018-11-21 LAB — LACTIC ACID, PLASMA
Lactic Acid, Venous: 1.2 mmol/L (ref 0.5–1.9)
Lactic Acid, Venous: 1.8 mmol/L (ref 0.5–1.9)

## 2018-11-21 LAB — PROCALCITONIN: Procalcitonin: 0.87 ng/mL

## 2018-11-21 MED ORDER — VANCOMYCIN 50 MG/ML ORAL SOLUTION
500.0000 mg | Freq: Four times a day (QID) | ORAL | Status: DC
  Administered 2018-11-21 – 2018-11-27 (×24): 500 mg via ORAL
  Filled 2018-11-21 (×28): qty 10

## 2018-11-21 MED ORDER — VANCOMYCIN 50 MG/ML ORAL SOLUTION
500.0000 mg | Freq: Four times a day (QID) | ORAL | Status: DC
  Filled 2018-11-21 (×2): qty 10

## 2018-11-21 MED ORDER — METRONIDAZOLE IN NACL 5-0.79 MG/ML-% IV SOLN: 500.0000 mg | Freq: Three times a day (TID) | INTRAVENOUS | Status: DC

## 2018-11-21 MED ORDER — DULOXETINE HCL 30 MG PO CPEP
30.0000 mg | ORAL_CAPSULE | Freq: Two times a day (BID) | ORAL | Status: DC
  Administered 2018-11-21 – 2018-12-04 (×26): 30 mg via ORAL
  Filled 2018-11-21 (×25): qty 1

## 2018-11-21 MED ORDER — METRONIDAZOLE IN NACL 5-0.79 MG/ML-% IV SOLN
500.0000 mg | Freq: Three times a day (TID) | INTRAVENOUS | Status: AC
  Administered 2018-11-21 – 2018-11-27 (×18): 500 mg via INTRAVENOUS
  Filled 2018-11-21 (×23): qty 100

## 2018-11-21 MED ORDER — METRONIDAZOLE IN NACL 5-0.79 MG/ML-% IV SOLN
500.0000 mg | Freq: Three times a day (TID) | INTRAVENOUS | Status: DC
  Filled 2018-11-21 (×4): qty 100

## 2018-11-21 NOTE — Consult Note (Signed)
NAME: Bobby Lane  DOB: May 12, 1940  MRN: [id number]  Date/Time: 11/21/2018 6:20 PM  REQUESTING PROVIDER Dr. Margaretmary Eddy Subjective:  REASON FOR CONSULT: Sepsis  Unable to get a good history from patient due to his mental status and difficulty in hearing? Bobby Lane is a 78 y.o.male  with a history of HTN, COPD, CAD, renal calculi, BPH presented to the ED on 11/18/2020 by EMS. HE was falling at home and was very weak with poor appetite and his wife called EMS. Pt was recently in ED on 10/1 with incontinence and fever of 100.6. He ws treated for UTI with Iv ceftriaxone and sent home on PO keflex- on 10/6 he was called and antibiotic changed to amoxicillin because of enterococcus in the urine. On admission wbc 26, temp of 100.5. Bladder scan showed 600cc urine and a foley was inserted .blood culture and urine culture sent- UA normal. Started vanco and cefepime. The latter was changed to zosyn on 11/20/18. Pt has been obtunded and complaining of abdominal pain. As WBC continued to be high he had a CT scan abdomen yesterday and that showed dilated loop of colon with thickening. He was seen by GI today and started empirically on vanco . Pt was noted to have loose stools last night and stool test for cdiff is pending I am asked to see the patient for sepsis/? UTI and cdiff  Past Medical History:  Diagnosis Date  . Arthritis   . Cancer ([place])    SKIN  . COPD (chronic obstructive pulmonary disease) ([place])   . Coronary artery disease   . Dyspnea    DOE  . GERD (gastroesophageal reflux disease)   . Gout   . Heart murmur   . History of hiatal hernia   . History of kidney stones   . History of wheezing   . HOH (hard of hearing)   . Hypertension   . Hypothyroidism   . Oxygen deficit    PRN USE  . Renal disorder   . Thyroid disease     Past Surgical History:  Procedure Laterality Date  . CATARACT EXTRACTION W/PHACO Right 08/21/2016   Procedure: CATARACT EXTRACTION PHACO AND INTRAOCULAR LENS PLACEMENT  (IOC);  Surgeon: Birder Robson, MD;  Location: [facility] ORS;  Service: Ophthalmology;  Laterality: Right;  [place] 00:54.9 AP% 18.3 CDE 10.04 Fluid pack lot # [id number]  . CATARACT EXTRACTION W/PHACO Left 09/18/2016   Procedure: CATARACT EXTRACTION PHACO AND INTRAOCULAR LENS PLACEMENT (IOC);  Surgeon: Birder Robson, MD;  Location: [facility] ORS;  Service: Ophthalmology;  Laterality: Left;  [place] 00:56 AP% 19.7 CDE 11.07 Fluid pack lot # [id number]  . FRACTURE SURGERY     ANKLE  . HERNIA REPAIR    . UPPER GI ENDOSCOPY      Social History   Socioeconomic History  . Marital status: Single    Spouse name: Not on file  . Number of children: Not on file  . Years of education: Not on file  . Highest education level: Not on file  Occupational History  . Not on file  Social Needs  . Financial resource strain: Not on file  . Food insecurity    Worry: Not on file    Inability: Not on file  . Transportation needs    Medical: Not on file    Non-medical: Not on file  Tobacco Use  . Smoking status: Never Smoker  . Smokeless tobacco: Never Used  Substance and Sexual Activity  . Alcohol use: No  .  Drug use: No  . Sexual activity: Not on file  Lifestyle  . Physical activity    Days per week: Not on file    Minutes per session: Not on file  . Stress: Not on file  Relationships  . Social Herbalist on phone: Not on file    Gets together: Not on file    Attends religious service: Not on file    Active member of club or organization: Not on file    Attends meetings of clubs or organizations: Not on file    Relationship status: Not on file  . Intimate partner violence    Fear of current or ex partner: Not on file    Emotionally abused: Not on file    Physically abused: Not on file    Forced sexual activity: Not on file  Other Topics Concern  . Not on file  Social History Narrative  . Not on file    Family History  Problem Relation Age of Onset  . Prostate cancer Neg Hx   .  Bladder Cancer Neg Hx   . Kidney cancer Neg Hx    Allergies  Allergen Reactions  . Codeine Nausea And Vomiting  . Morphine And Related Nausea And Vomiting  . Novocain [Procaine] Hives    NO TROUBLE IN OR  . Other Nausea And Vomiting  . Percocet [Oxycodone-Acetaminophen] Nausea And Vomiting  . Sulfa Antibiotics Hives  . Tramadol Nausea And Vomiting    Note: upset stomach Note: upset stomach     ? Current Facility-Administered Medications  Medication Dose Route Frequency Provider Last Rate Last Dose  . 0.9 %  sodium chloride infusion   Intravenous Continuous Harrie Foreman, MD 125 mL/hr at 11/21/18 1437    . acetaminophen (TYLENOL) tablet 650 mg  650 mg Oral Q6H PRN Harrie Foreman, MD   650 mg at 11/19/18 1410   Or  . acetaminophen (TYLENOL) suppository 650 mg  650 mg Rectal Q6H PRN Harrie Foreman, MD      . albuterol (PROVENTIL) (2.5 MG/3ML) 0.083% nebulizer solution 2.5 mg  2.5 mg Nebulization Q4H PRN Gouru, Aruna, MD   2.5 mg at 11/20/18 1319  . allopurinol (ZYLOPRIM) tablet 300 mg  300 mg Oral Daily Harrie Foreman, MD   300 mg at 11/21/18 0926  . Chlorhexidine Gluconate Cloth 2 % PADS 6 each  6 each Topical Daily Gouru, Aruna, MD   6 each at 11/21/18 1100  . cloNIDine (CATAPRES) tablet 0.1 mg  0.1 mg Oral BID Harrie Foreman, MD   0.1 mg at 11/21/18 0925  . docusate sodium (COLACE) capsule 100 mg  100 mg Oral BID Harrie Foreman, MD   100 mg at 11/21/18 [id number]  . DULoxetine (CYMBALTA) DR capsule 30 mg  30 mg Oral BID Gouru, Aruna, MD      . heparin injection 5,000 Units  5,000 Units Subcutaneous Q8H Harrie Foreman, MD   5,000 Units at 11/21/18 1439  . levothyroxine (SYNTHROID) tablet 100 mcg  100 mcg Oral QAC breakfast Harrie Foreman, MD   100 mcg at 11/21/18 0644  . losartan (COZAAR) tablet 100 mg  100 mg Oral Daily Harrie Foreman, MD   100 mg at 11/21/18 0925  . metoprolol succinate (TOPROL-XL) 24 hr tablet 100 mg  100 mg Oral Daily Harrie Foreman, MD   100 mg at 11/21/18 0926  . metroNIDAZOLE (FLAGYL) IVPB 500 mg  500 mg Intravenous  Q8H Tsosie Billing, MD      . ondansetron [facility]) tablet 4 mg  4 mg Oral Q6H PRN Harrie Foreman, MD       Or  . ondansetron [facility]) injection 4 mg  4 mg Intravenous Q6H PRN Harrie Foreman, MD      . rosuvastatin (CRESTOR) tablet 5 mg  5 mg Oral Matthias Hughs, MD   5 mg at 11/20/18 0809  . spironolactone (ALDACTONE) tablet 25 mg  25 mg Oral Daily Harrie Foreman, MD   25 mg at 11/21/18 [id number]  . tamsulosin (FLOMAX) capsule 0.4 mg  0.4 mg Oral Daily Harrie Foreman, MD   0.4 mg at 11/21/18 0925  . vancomycin (VANCOCIN) 50 mg/mL oral solution 500 mg  500 mg Oral Q6H Shanlever, Pierce Crane, RPH   500 mg at 11/21/18 1438     Abtx:  Anti-infectives (From admission, onward)   Start     Dose/Rate Route Frequency Ordered Stop   11/21/18 1830  metroNIDAZOLE (FLAGYL) IVPB 500 mg     500 mg 100 mL/hr over 60 Minutes Intravenous Every 8 hours 11/21/18 1816     11/21/18 1400  metroNIDAZOLE (FLAGYL) IVPB 500 mg  Status:  Discontinued     500 mg 100 mL/hr over 60 Minutes Intravenous Every 8 hours 11/21/18 1347 11/21/18 1618   11/21/18 1400  vancomycin (VANCOCIN) 50 mg/mL oral solution 500 mg  Status:  Discontinued     500 mg Oral Every 6 hours 11/21/18 1347 11/21/18 1357   11/21/18 1400  metroNIDAZOLE (FLAGYL) IVPB 500 mg  Status:  Discontinued     500 mg 100 mL/hr over 60 Minutes Intravenous Every 8 hours 11/21/18 1347 11/21/18 1347   11/21/18 1400  vancomycin (VANCOCIN) 50 mg/mL oral solution 500 mg     500 mg Oral Every 6 hours 11/21/18 1358 12/05/18 1159   11/20/18 2000  vancomycin (VANCOCIN) IVPB 1000 mg/200 mL premix  Status:  Discontinued     1,000 mg 200 mL/hr over 60 Minutes Intravenous Every 24 hours 11/20/18 1448 11/21/18 0746   11/20/18 1600  metroNIDAZOLE (FLAGYL) IVPB 500 mg  Status:  Discontinued     500 mg 100 mL/hr over 60 Minutes Intravenous Every 8 hours  11/20/18 1507 11/20/18 1626   11/20/18 1515  piperacillin-tazobactam (ZOSYN) IVPB 3.375 g  Status:  Discontinued     3.375 g 12.5 mL/hr over 240 Minutes Intravenous Every 8 hours 11/20/18 1507 11/21/18 1730   11/19/18 1930  vancomycin (VANCOCIN) 1,250 mg in sodium chloride 0.9 % 250 mL IVPB     1,250 mg 166.7 mL/hr over 90 Minutes Intravenous  Once 11/19/18 1901 11/19/18 2211   11/19/18 1800  vancomycin (VANCOCIN) 1,250 mg in sodium chloride 0.9 % 250 mL IVPB  Status:  Discontinued     1,250 mg 166.7 mL/hr over 90 Minutes Intravenous  Once 11/19/18 1734 11/19/18 1901   11/19/18 1600  ceFEPIme (MAXIPIME) 2 g in sodium chloride 0.9 % 100 mL IVPB  Status:  Discontinued     2 g 200 mL/hr over 30 Minutes Intravenous Every 12 hours 11/19/18 0634 11/20/18 1507   11/19/18 1400  metroNIDAZOLE (FLAGYL) IVPB 500 mg  Status:  Discontinued     500 mg 100 mL/hr over 60 Minutes Intravenous Every 8 hours 11/19/18 0634 11/20/18 0924   11/19/18 1204  vancomycin variable dose per unstable renal function (pharmacist dosing)  Status:  Discontinued      Does not apply See admin  instructions 11/19/18 1204 11/20/18 1447   11/19/18 0415  vancomycin (VANCOCIN) 2,000 mg in sodium chloride 0.9 % 500 mL IVPB     2,000 mg 250 mL/hr over 120 Minutes Intravenous  Once 11/19/18 0405 11/19/18 0733   11/19/18 0400  ceFEPIme (MAXIPIME) 2 g in sodium chloride 0.9 % 100 mL IVPB     2 g 200 mL/hr over 30 Minutes Intravenous  Once 11/19/18 0357 11/19/18 0511   11/19/18 0400  metroNIDAZOLE (FLAGYL) IVPB 500 mg     500 mg 100 mL/hr over 60 Minutes Intravenous  Once 11/19/18 0357 11/19/18 0548   11/19/18 0400  vancomycin (VANCOCIN) IVPB 1000 mg/200 mL premix  Status:  Discontinued     1,000 mg 200 mL/hr over 60 Minutes Intravenous  Once 11/19/18 0357 11/19/18 0405      REVIEW OF SYSTEMS:  Const: fever, negative chills, negative weight loss Eyes: negative diplopia or visual changes, negative eye pain ENT: negative coryza,  negative sore throat Resp: negative cough, hemoptysis, dyspnea Cards: negative for chest pain, palpitations, lower extremity edema GU:  frequency, dysuria  GI:  abdominal pain, poor appetite, nausea Skin: negative for rash and pruritus Heme: negative for easy bruising and gum/nose bleeding MS: generalized weakness, falls Neurolo:negative for headaches, has dizziness, no vertigo, memory problems  Psych: negative for feelings of anxiety, depression  Endocrine: hypothyroidism Allergy/Immunology- as noted ?  Objective:  VITALS:  BP 112/64 (BP Location: Left Arm)   Pulse 81   Temp 98.2 F (36.8 C) (Oral)   Resp (!) 21   Ht 5\' 7"  (1.702 m)   Wt 94.9 kg   SpO2 96%   BMI 32.78 kg/m  PHYSICAL EXAM:  General: Awake but slow and lethargic, hard of hearing- knows the year, month, place and  Head: Normocephalic, without obvious abnormality, atraumatic. Eyes: Conjunctivae clear, anicteric sclerae. Pupils are equal ENT Nares normal. No drainage or sinus tenderness. Oral mucosa dry Neck: Supple, symmetrical, no adenopathy, thyroid: non tender no carotid bruit and no JVD. Back: No CVA tenderness. Lungs: b/ air entry , decreased bases Heart: s1s2 tachycardia Abdomen: distended, tender, bowel sounds exaggerated.   Extremities: feet cold Skin: No rashes or lesions. Or bruising Lymph: Cervical, supraclavicular normal. Neurologic: Grossly non-focal Pertinent Labs Lab Results CBC    Component Value Date/Time   WBC 26.8 (H) 11/21/2018 0516   RBC 3.19 (L) 11/21/2018 0516   HGB 10.9 (L) 11/21/2018 0516   HGB 15.0 09/02/2013 2101   HCT 32.9 (L) 11/21/2018 0516   HCT 45.0 09/02/2013 2101   PLT 245 11/21/2018 0516   PLT 204 09/02/2013 2101   MCV 103.1 (H) 11/21/2018 0516   MCV 100 09/02/2013 2101   MCH 34.2 (H) 11/21/2018 0516   MCHC 33.1 11/21/2018 0516   RDW 13.9 11/21/2018 0516   RDW 13.9 09/02/2013 2101   LYMPHSABS 0.8 11/21/2018 0516   LYMPHSABS 2.3 09/02/2013 2101   MONOABS  1.1 (H) 11/21/2018 0516   MONOABS 0.6 09/02/2013 2101   EOSABS 0.0 11/21/2018 0516   EOSABS 0.1 09/02/2013 2101   BASOSABS 0.0 11/21/2018 0516   BASOSABS 0.1 09/02/2013 2101    CMP Latest Ref Rng & Units 11/21/2018 11/20/2018 11/19/2018  Glucose 70 - 99 mg/dL - 127(H) 131(H)  BUN 8 - 23 mg/dL - 26(H) 46(H)  Creatinine 0.61 - 1.24 mg/dL 1.19 1.22 1.63(H)  Sodium 135 - 145 mmol/L - 137 136  Potassium 3.5 - 5.1 mmol/L - 3.8 4.2  Chloride 98 - 111 mmol/L - 110  101  CO2 22 - 32 mmol/L - 23 23  Calcium 8.9 - 10.3 mg/dL - 7.9(L) 9.2  Total Protein 6.5 - 8.1 g/dL - - 7.2  Total Bilirubin 0.3 - 1.2 mg/dL - - 0.9  Alkaline Phos 38 - 126 U/L - - 60  AST 15 - 41 U/L - - 18  ALT 0 - 44 U/L - - 13      Microbiology: Recent Results (from the past 240 hour(s))  Blood Culture (routine x 2)     Status: None   Collection Time: 11/13/18 11:00 PM   Specimen: BLOOD  Result Value Ref Range Status   Specimen Description BLOOD RIGHT ASSIST CONTROL  Final   Special Requests   Final    BOTTLES DRAWN AEROBIC AND ANAEROBIC Blood Culture results may not be optimal due to an excessive volume of blood received in culture bottles   Culture   Final    NO GROWTH 5 DAYS Performed at [facility], [address] [place], [place] [address]    Report Status 11/18/2018 FINAL  Final  Blood Culture (routine x 2)     Status: None   Collection Time: 11/13/18 11:00 PM   Specimen: BLOOD  Result Value Ref Range Status   Specimen Description BLOOD LEFT ANTECUBITAL  Final   Special Requests   Final    BOTTLES DRAWN AEROBIC AND ANAEROBIC Blood Culture results may not be optimal due to an excessive volume of blood received in culture bottles   Culture   Final    NO GROWTH 5 DAYS Performed at [facility], [address], [place], [place] [address]    Report Status 11/18/2018 FINAL  Final  SARS Coronavirus 2 [facility] order, Performed in [facility] hospital lab) Nasopharyngeal Nasopharyngeal Swab      Status: None   Collection Time: 11/13/18 11:22 PM   Specimen: Nasopharyngeal Swab  Result Value Ref Range Status   SARS Coronavirus 2 NEGATIVE NEGATIVE Final    Comment: (NOTE) If result is NEGATIVE SARS-CoV-2 target nucleic acids are NOT DETECTED. The SARS-CoV-2 RNA is generally detectable in upper and lower  respiratory specimens during the acute phase of infection. The lowest  concentration of SARS-CoV-2 viral copies this assay can detect is 250  copies / mL. A negative result does not preclude SARS-CoV-2 infection  and should not be used as the sole basis for treatment or other  patient management decisions.  A negative result may occur with  improper specimen collection / handling, submission of specimen other  than nasopharyngeal swab, presence of viral mutation(s) within the  areas targeted by this assay, and inadequate number of viral copies  (<250 copies / mL). A negative result must be combined with clinical  observations, patient history, and epidemiological information. If result is POSITIVE SARS-CoV-2 target nucleic acids are DETECTED. The SARS-CoV-2 RNA is generally detectable in upper and lower  respiratory specimens dur ing the acute phase of infection.  Positive  results are indicative of active infection with SARS-CoV-2.  Clinical  correlation with patient history and other diagnostic information is  necessary to determine patient infection status.  Positive results do  not rule out bacterial infection or co-infection with other viruses. If result is PRESUMPTIVE POSTIVE SARS-CoV-2 nucleic acids MAY BE PRESENT.   A presumptive positive result was obtained on the submitted specimen  and confirmed on repeat testing.  While 2019 novel coronavirus  (SARS-CoV-2) nucleic acids may be present in the submitted sample  additional confirmatory  testing may be necessary for epidemiological  and / or clinical management purposes  to differentiate between  SARS-CoV-2 and  other Sarbecovirus currently known to infect humans.  If clinically indicated additional testing with an alternate test  methodology [phone]) is advised. The SARS-CoV-2 RNA is generally  detectable in upper and lower respiratory sp ecimens during the acute  phase of infection. The expected result is Negative. Fact Sheet for Patients:  StrictlyIdeas.no Fact Sheet for Healthcare Providers: BankingDealers.co.za This test is not yet approved or cleared by the [place] FDA and has been authorized for detection and/or diagnosis of SARS-CoV-2 by FDA under an Emergency Use Authorization (EUA).  This EUA will remain in effect (meaning this test can be used) for the duration of the COVID-19 declaration under Section 564(b)(1) of the Act, 21 U.S.C. section 360bbb-3(b)(1), unless the authorization is terminated or revoked sooner. Performed at [facility], [address] [place], [place] [address]   Urine culture     Status: Abnormal   Collection Time: 11/14/18 12:18 AM   Specimen: In/Out Cath Urine  Result Value Ref Range Status   Specimen Description   Final    IN/OUT CATH URINE Performed at [facility], [address], [place], [place] [address]    Special Requests   Final    NONE Performed at [facility], [address] [place], [place] [address]    Culture >=100,000 COLONIES/mL ENTEROCOCCUS FAECALIS (A)  Final   Report Status 11/15/2018 FINAL  Final   Organism ID, Bacteria ENTEROCOCCUS FAECALIS (A)  Final      Susceptibility   Enterococcus faecalis - MIC*    AMPICILLIN <=2 SENSITIVE Sensitive     LEVOFLOXACIN 2 SENSITIVE Sensitive     NITROFURANTOIN <=16 SENSITIVE Sensitive     VANCOMYCIN 1 SENSITIVE Sensitive     * >=100,000 COLONIES/mL ENTEROCOCCUS FAECALIS  Culture, blood (routine x 2)     Status: None (Preliminary result)   Collection Time: 11/19/18  3:28 AM   Specimen: BLOOD  Result Value  Ref Range Status   Specimen Description BLOOD LEFT FA  Final   Special Requests   Final    BOTTLES DRAWN AEROBIC AND ANAEROBIC Blood Culture results may not be optimal due to an excessive volume of blood received in culture bottles   Culture   Final    NO GROWTH 2 DAYS Performed at [facility], [address] [place], [place] [address]    Report Status PENDING  Incomplete  Culture, blood (routine x 2)     Status: None (Preliminary result)   Collection Time: 11/19/18  3:28 AM   Specimen: BLOOD  Result Value Ref Range Status   Specimen Description BLOOD RIGHT HAND  Final   Special Requests   Final    BOTTLES DRAWN AEROBIC AND ANAEROBIC Blood Culture adequate volume   Culture   Final    NO GROWTH 2 DAYS Performed at [facility] [place], [address] [place],  [address]    Report Status PENDING  Incomplete  SARS Coronavirus 2 [facility] order, Performed in [place] hospital lab) Nasopharyngeal Nasopharyngeal Swab     Status: None   Collection Time: 11/19/18  3:28 AM   Specimen: Nasopharyngeal Swab  Result Value Ref Range Status   SARS Coronavirus 2 NEGATIVE NEGATIVE Final    Comment: (NOTE) If result is NEGATIVE SARS-CoV-2 target nucleic acids are NOT DETECTED. The SARS-CoV-2 RNA is generally detectable in upper and lower  respiratory specimens during  the acute phase of infection. The lowest  concentration of SARS-CoV-2 viral copies this assay can detect is 250  copies / mL. A negative result does not preclude SARS-CoV-2 infection  and should not be used as the sole basis for treatment or other  patient management decisions.  A negative result may occur with  improper specimen collection / handling, submission of specimen other  than nasopharyngeal swab, presence of viral mutation(s) within the  areas targeted by this assay, and inadequate number of viral copies  (<250 copies / mL). A negative result must be combined with clinical  observations, patient  history, and epidemiological information. If result is POSITIVE SARS-CoV-2 target nucleic acids are DETECTED. The SARS-CoV-2 RNA is generally detectable in upper and lower  respiratory specimens dur ing the acute phase of infection.  Positive  results are indicative of active infection with SARS-CoV-2.  Clinical  correlation with patient history and other diagnostic information is  necessary to determine patient infection status.  Positive results do  not rule out bacterial infection or co-infection with other viruses. If result is PRESUMPTIVE POSTIVE SARS-CoV-2 nucleic acids MAY BE PRESENT.   A presumptive positive result was obtained on the submitted specimen  and confirmed on repeat testing.  While 2019 novel coronavirus  (SARS-CoV-2) nucleic acids may be present in the submitted sample  additional confirmatory testing may be necessary for epidemiological  and / or clinical management purposes  to differentiate between  SARS-CoV-2 and other Sarbecovirus currently known to infect humans.  If clinically indicated additional testing with an alternate test  methodology [phone]) is advised. The SARS-CoV-2 RNA is generally  detectable in upper and lower respiratory sp ecimens during the acute  phase of infection. The expected result is Negative. Fact Sheet for Patients:  StrictlyIdeas.no Fact Sheet for Healthcare Providers: BankingDealers.co.za This test is not yet approved or cleared by the [place] FDA and has been authorized for detection and/or diagnosis of SARS-CoV-2 by FDA under an Emergency Use Authorization (EUA).  This EUA will remain in effect (meaning this test can be used) for the duration of the COVID-19 declaration under Section 564(b)(1) of the Act, 21 U.S.C. section 360bbb-3(b)(1), unless the authorization is terminated or revoked sooner. Performed at [facility], [address] [place] [address], [place], [place]  [address]   Urine culture     Status: None   Collection Time: 11/19/18  5:45 AM   Specimen: Urine, Random  Result Value Ref Range Status   Specimen Description   Final    URINE, RANDOM Performed at [facility], [address] [place], [place] [address]    Special Requests   Final    NONE Performed at [facility], [address], [place], [place] [address]    Culture   Final    NO GROWTH Performed at [facility], [place] [address], [place], [place] [address]    Report Status 11/20/2018 FINAL  Final  Respiratory Panel by PCR     Status: None   Collection Time: 11/19/18  9:30 AM   Specimen: Nasopharyngeal Swab; Respiratory  Result Value Ref Range Status   Adenovirus NOT DETECTED NOT DETECTED Final   Coronavirus 229E NOT DETECTED NOT DETECTED Final    Comment: (NOTE) The Coronavirus on the Respiratory Panel, DOES NOT test for the novel  Coronavirus (2019 nCoV)    Coronavirus HKU1 NOT DETECTED NOT DETECTED Final   Coronavirus NL63 NOT DETECTED NOT DETECTED Final   Coronavirus OC43 NOT DETECTED NOT  DETECTED Final   Metapneumovirus NOT DETECTED NOT DETECTED Final   Rhinovirus / Enterovirus NOT DETECTED NOT DETECTED Final   Influenza A NOT DETECTED NOT DETECTED Final   Influenza B NOT DETECTED NOT DETECTED Final   Parainfluenza Virus 1 NOT DETECTED NOT DETECTED Final   Parainfluenza Virus 2 NOT DETECTED NOT DETECTED Final   Parainfluenza Virus 3 NOT DETECTED NOT DETECTED Final   Parainfluenza Virus 4 NOT DETECTED NOT DETECTED Final   Respiratory Syncytial Virus NOT DETECTED NOT DETECTED Final   Bordetella pertussis NOT DETECTED NOT DETECTED Final   Chlamydophila pneumoniae NOT DETECTED NOT DETECTED Final   Mycoplasma pneumoniae NOT DETECTED NOT DETECTED Final    Comment: Performed at [facility], [place] [address] [place], [place] [address]  MRSA PCR Screening     Status: None   Collection Time: 11/19/18  9:30 AM   Specimen: Nasal Mucosa;  Nasopharyngeal  Result Value Ref Range Status   MRSA by PCR NEGATIVE NEGATIVE Final    Comment:        The GeneXpert MRSA Assay (FDA approved for NASAL specimens only), is one component of a comprehensive MRSA colonization surveillance program. It is not intended to diagnose MRSA infection nor to guide or monitor treatment for MRSA infections. Performed at [facility], [address] [place], [place] [address]     IMAGING RESULTS:  I have personally reviewed the films  ? The colon is diffusely distended with wall thickening and adjacent fat stranding, containing fluid to the rectum   Impression/Recommendation ?78 y.o.male  with a history of HTN, COPD, CAD, renal calculi, BPH presented to the ED on 11/18/2020 by EMS. HE was falling at home and was very weak with poor appetite and his wife called EMS ? ?Sepsis- no pneumonia, No UTI, BC neg, UC neg Abd distended, tender, diarrhea, CT abdomen shows diffusely distended colon with wall thickening and adjacent fat stranding, containing fluid to the rectum Cdiff colitis is very likely with careful monitoring for signs of megacolon Will Dc zosyn as no other source identified and leucocytosis has worsened on antibiotics and Blood and urine culture negative Will add Iv metronidazole to Po vanco. May add vanco enema tomorrow. Test pending  BPH- with urine retention- has foley  AKI on CKD  Discussed the management with Dr.Gouru. Dr.Anna and pt's nurse. Discussed with patient as well  Note:  This document was prepared using Dragon voice recognition software and may include unintentional dictation errors.

## 2018-11-21 NOTE — Progress Notes (Signed)
Talked to Lab and asked if they can use the stool sample that was sent to also check for GI panel and norovirus, per lab sample is not enough to use and will need to collect more than the one we sent awhile ago. RN will continue to monitor.

## 2018-11-21 NOTE — Plan of Care (Signed)
  Problem: Clinical Measurements: Goal: Ability to maintain clinical measurements within normal limits will improve Outcome: Progressing   

## 2018-11-21 NOTE — Consult Note (Signed)
Bobby Lane , MD [address], [place], [place], [place], [address] [id number], [place], [place], [place], [address] Phone: [phone]  Fax: [phone]  Consultation  Referring Provider:    Dr Margaretmary Eddy Primary Care Physician:  Danelle Berry, NP Primary Gastroenterologist:  [place]          Reason for Consultation:     Colitis  Date of Admission:  11/19/2018 Date of Consultation:  11/21/2018         HPI:   Bobby Lane is a 78 y.o. male presented to the emergency room on 11/19/2018 with altered mental status.  Recent history of UTI.  He has been taking antibiotics but presented to the emergency room febrile.  Seen by Dr. Margaretmary Eddy yesterday and he had some abdominal pain.  On 11/20/2018 white cell count was 24,000.  Creatinine 1.63. Underwent a CT scan of the abdomen and pelvis yesterday.  Noted to have a colon that is diffusely distended with wall thickening consistent with colitis.  Trace ascites seen in the abdomen and pelvis.  Coarse contour of the liver suggestive of cirrhosis.  No mention of diarrhea so far but per Dr. Margaretmary Eddy  has had watery stools last night.  Stool for C. difficile and GI PCR has just been requested.  Hemoglobin 10.9 today with a white cell count of 26.8.  He does complain of some abdominal pain. He wasn't clear if he did or didn't have diarrhea . Laying comfrtably on the bed.   Past Medical History:  Diagnosis Date  . Arthritis   . Cancer ([place])    SKIN  . COPD (chronic obstructive pulmonary disease) ([place])   . Coronary artery disease   . Dyspnea    DOE  . GERD (gastroesophageal reflux disease)   . Gout   . Heart murmur   . History of hiatal hernia   . History of kidney stones   . History of wheezing   . HOH (hard of hearing)   . Hypertension   . Hypothyroidism   . Oxygen deficit    PRN USE  . Renal disorder   . Thyroid disease     Past Surgical History:  Procedure Laterality Date  . CATARACT EXTRACTION W/PHACO Right 08/21/2016    Procedure: CATARACT EXTRACTION PHACO AND INTRAOCULAR LENS PLACEMENT (IOC);  Surgeon: Birder Robson, MD;  Location: [facility] ORS;  Service: Ophthalmology;  Laterality: Right;  [place] 00:54.9 AP% 18.3 CDE 10.04 Fluid pack lot # [id number]  . CATARACT EXTRACTION W/PHACO Left 09/18/2016   Procedure: CATARACT EXTRACTION PHACO AND INTRAOCULAR LENS PLACEMENT (IOC);  Surgeon: Birder Robson, MD;  Location: [facility] ORS;  Service: Ophthalmology;  Laterality: Left;  [place] 00:56 AP% 19.7 CDE 11.07 Fluid pack lot # [id number]  . FRACTURE SURGERY     ANKLE  . HERNIA REPAIR    . UPPER GI ENDOSCOPY      Prior to Admission medications   Medication Sig Start Date End Date Taking? Authorizing Provider  allopurinol (ZYLOPRIM) 300 MG tablet Take 300 mg by mouth daily.   Yes [provider]  amoxicillin-clavulanate (AUGMENTIN) 875-125 MG tablet Take 1 tablet by mouth every 12 (twelve) hours. 11/18/18  Yes Billey Co, MD  cetirizine (ZYRTEC) 10 MG tablet Take 10 mg by mouth at bedtime.  06/09/18  Yes [provider]  Cholecalciferol (VITAMIN D3) 5000 UNITS TABS Take 5,000 Units by mouth daily.   Yes [provider]  clonazePAM (KLONOPIN) 0.5 MG tablet Take 0.5 mg by mouth  at bedtime as needed for anxiety.  09/08/18  Yes [provider]  cloNIDine (CATAPRES) 0.2 MG tablet Take 0.1 mg by mouth 2 (two) times daily.   Yes [provider]  DULoxetine (CYMBALTA) 30 MG capsule Take 30 mg by mouth 2 (two) times daily.    Yes [provider]  losartan (COZAAR) 100 MG tablet Take 100 mg by mouth daily.   Yes [provider]  metoprolol succinate (TOPROL-XL) 100 MG 24 hr tablet Take 100 mg by mouth daily. 06/10/18  Yes [provider]  pantoprazole (PROTONIX) 40 MG tablet Take 40 mg by mouth daily.   Yes [provider]  rosuvastatin (CRESTOR) 5 MG tablet Take 5 mg by mouth every other day. 09/08/18  Yes [provider]  spironolactone  (ALDACTONE) 25 MG tablet Take 25 mg by mouth daily.   Yes [provider]  sucralfate (CARAFATE) 1 g tablet Take 1 g by mouth 4 (four) times daily. 10/10/18  Yes [provider]  SYNTHROID 100 MCG tablet Take 100 mcg by mouth daily before breakfast.  08/07/18  Yes [provider]  tamsulosin (FLOMAX) 0.4 MG CAPS capsule Take 1 capsule (0.4 mg total) by mouth daily. 05/21/17  Yes Stoioff, Ronda Fairly, MD  metroNIDAZOLE (FLAGYL) 500 MG tablet  03/31/18   [provider]  sodium citrate-citric acid (ORACIT) 500-334 MG/5ML solution Take 30 mLs by mouth 2 (two) times daily after a meal. Patient not taking: Reported on 11/19/2018 10/08/17   Stoioff, Ronda Fairly, MD  valACYclovir (VALTREX) 1000 MG tablet TAKE 2 TABLETS BY MOUTH FOR 1 DOSE NOW THEN REPEAT 2 TABLETS IN 12 HOURS 07/16/18   [provider]    Family History  Problem Relation Age of Onset  . Prostate cancer Neg Hx   . Bladder Cancer Neg Hx   . Kidney cancer Neg Hx      Social History   Tobacco Use  . Smoking status: Never Smoker  . Smokeless tobacco: Never Used  Substance Use Topics  . Alcohol use: No  . Drug use: No    Allergies as of 11/19/2018 - Review Complete 11/19/2018  Allergen Reaction Noted  . Codeine Nausea And Vomiting 06/03/2013  . Morphine and related Nausea And Vomiting 06/03/2013  . Novocain [procaine] Hives 06/03/2013  . Other Nausea And Vomiting 06/03/2013  . Percocet [oxycodone-acetaminophen] Nausea And Vomiting 06/03/2013  . Sulfa antibiotics Hives 06/03/2013  . Tramadol Nausea And Vomiting 06/28/2014    Review of Systems:    All systems reviewed and negative except where noted in HPI.   Physical Exam:  Vital signs in last 24 hours: Temp:  [97.5 F (36.4 C)-98.4 F (36.9 C)] 98.4 F (36.9 C) (10/09 0739) Pulse Rate:  [92-103] 100 (10/09 0926) Resp:  [20-21] 21 (10/09 0419) BP: (116-158)/(57-74) 138/74 (10/09 0926) SpO2:  [93 %-100 %] 93 % (10/09 0926) Weight:   [94.9 kg] 94.9 kg (10/09 0419) Last BM Date: 11/20/18 General:   Pleasant, cooperative in NAD Head:  Normocephalic and atraumatic. Eyes:   No icterus.   Conjunctiva pink. PERRLA. Ears:  Normal auditory acuity. Neck:  Supple; no masses or thyroidomegaly Lungs: Respirations even and unlabored. Lungs clear to auscultation bilaterally.   No wheezes, crackles, or rhonchi.  Heart:  Regular rate and rhythm;  Without murmur, clicks, rubs or gallops Abdomen:  Soft, non distended, mild generalized tenderness, no guarding or rigidity , BS+ Neurologic:  Alert and oriented x3;  grossly normal neurologically. Skin:  Intact  without significant lesions or rashes. Cervical Nodes:  No significant cervical adenopathy. Psych:  Alert and cooperative. Normal affect.  LAB RESULTS: Recent Labs    11/19/18 0328 11/20/18 0603 11/21/18 0516  WBC 26.6* 24.3* 26.8*  HGB 12.4* 11.1* 10.9*  HCT 37.3* 34.2* 32.9*  PLT 295 257 245   BMET Recent Labs    11/19/18 0328 11/20/18 0603 11/21/18 0516  NA 136 137  --   K 4.2 3.8  --   CL 101 110  --   CO2 23 23  --   GLUCOSE 131* 127*  --   BUN 46* 26*  --   CREATININE 1.63* 1.22 1.19  CALCIUM 9.2 7.9*  --    LFT Recent Labs    11/19/18 0328  PROT 7.2  ALBUMIN 3.4*  AST 18  ALT 13  ALKPHOS 60  BILITOT 0.9   PT/INR No results for input(s): LABPROT, INR in the last 72 hours.  STUDIES: Ct Abdomen Pelvis Wo Contrast  Result Date: 11/20/2018 CLINICAL DATA:  Abdominal pain, sepsis, elevated lactic acid, leukocytosis EXAM: CT ABDOMEN AND PELVIS WITHOUT CONTRAST TECHNIQUE: Multidetector CT imaging of the abdomen and pelvis was performed following the standard protocol without IV contrast. COMPARISON:  09/03/2013 FINDINGS: Lower chest: Trace bilateral pleural effusions. Coronary artery calcifications. Hepatobiliary: Coarse contour of the liver. No gallstones, gallbladder wall thickening, or biliary dilatation. Pancreas: Unremarkable. No pancreatic ductal  dilatation or surrounding inflammatory changes. Spleen: Normal in size without significant abnormality. Adrenals/Urinary Tract: Adrenal glands are unremarkable. Numerous tiny nonobstructive bilateral renal calculi. No hydronephrosis. Simple cyst of the inferior pole of the right kidney. Bladder is decompressed by Foley catheter. Stomach/Bowel: Stomach is within normal limits. The appendix is not clearly visualized and may be surgically absent. The colon is diffusely distended with wall thickening and adjacent fat stranding, containing fluid to the rectum. Vascular/Lymphatic: Aortic atherosclerosis. No enlarged abdominal or pelvic lymph nodes. Reproductive: No mass or other significant abnormality. Other: Fat and fluid containing right inguinal hernia (series 5, image 38). Trace ascites. 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 IMPRESSION: 1. The colon is diffusely distended with wall thickening and adjacent fat stranding, containing fluid to the rectum. Findings are consistent with nonspecific infectious, inflammatory, or ischemic colitis. There are no particular findings to suggest ischemia such as pneumatosis or portal venous gas. 2.  Trace ascites in the abdomen and pelvis. 3. Coarse contour of the liver, suggesting cirrhosis, however without definitive morphologic stigmata such as splenomegaly or varices. 4.  Nonobstructive bilateral nephrolithiasis. 5.  Aortic Atherosclerosis (ICD10-I70.0). Electronically Signed   By: Eddie Candle M.D.   On: 11/20/2018 15:00   Dg Forearm Right  Result Date: 11/20/2018 CLINICAL DATA:  Right arm pain after fall. EXAM: RIGHT FOREARM - 2 VIEW COMPARISON:  Right forearm x-rays dated May 02, 2010. FINDINGS: There is no evidence of fracture or other focal bone lesions. Slight cortical irregularity of the radial neck is chronic and unchanged since 2012. Soft tissues are unremarkable. IMPRESSION: No acute osseous abnormality. Electronically Signed    By: Titus Dubin M.D.   On: 11/20/2018 15:12   Dg Wrist Complete Right  Result Date: 11/20/2018 CLINICAL DATA:  Wrist pain after fall. EXAM: RIGHT WRIST - COMPLETE 3+ VIEW COMPARISON:  Right forearm x-rays dated May 02, 2010. Right hand x-rays dated April 26, 2010. FINDINGS: There is no evidence of fracture or dislocation. There is no evidence of arthropathy or other focal bone abnormality. Soft tissues are unremarkable. 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 IMPRESSION: 1.  No acute osseous abnormality. Electronically Signed   By: Titus Dubin M.D.   On: 11/20/2018 15:09      Impression / Plan:   Bobby Lane is a 78 y.o. y/o male admitted with altered mental status secondary to sepsis.  CT scan of the abdomen shows colitis.  Patient has had diarrhea since last night.  History of recent antibiotic use for UTI.  Admission complicated by AKI and significant leukocytosis.  This is very concerning for C. difficile colitis.  Stool test has been requested and results are awaited.  Plan 1.  I would suggest to empirically treat with p.o. vancomycin and IV Flagyl while we are awaiting the stool results for severe c diff .   2. Lack of diarrhea may be seen at times in severe colitis.   3. Continue IV fluids, serial abdominal exam - if has worsening pain or tenderness or tachycardia then obtain imaging and surgical consult to rule out toxic mega colon .     Thank you for involving me in the care of this patient.      LOS: 2 days   Bobby Bellows, MD  11/21/2018, 11:46 AM

## 2018-11-21 NOTE — Plan of Care (Signed)
  Problem: Clinical Measurements: Goal: Ability to maintain clinical measurements within normal limits will improve Outcome: Progressing   Problem: Fluid Volume: Goal: Hemodynamic stability will improve Outcome: Progressing   Problem: Clinical Measurements: Goal: Diagnostic test results will improve Outcome: Progressing Goal: Signs and symptoms of infection will decrease Outcome: Progressing   Problem: Respiratory: Goal: Ability to maintain adequate ventilation will improve Outcome: Progressing

## 2018-11-21 NOTE — Progress Notes (Signed)
[place] [facility] [place] NAME: Bobby Lane    MR#:  [id number]  DATE OF BIRTH:  1940-08-21  SUBJECTIVE:  CHIEF COMPLAINT: Patient is still sick looking reporting diffuse abdominal discomfort and lower abdominal pain.  2 episodes of watery diarrhea last night according to the nurses report.  Decreased appetite Patient is supposed to see urologist as an outpatient but could not make it and he received a p.o. Keflex one-time dose prior to the admission per son's report Patient does not drink alcohol according to the son REVIEW OF SYSTEMS:  CONSTITUTIONAL: has  fever, no fatigue or weakness.  EYES: No blurred or double vision.  EARS, NOSE, AND THROAT: No tinnitus or ear pain.  RESPIRATORY: No cough, shortness of breath, wheezing or hemoptysis.  CARDIOVASCULAR: No chest pain, orthopnea, edema.  GASTROINTESTINAL: No nausea, vomiting, diarrhea , reporting suprapubic abdominal pain.  HEMATOLOGY: No anemia, easy bruising or bleeding SKIN: No rash or lesion. MUSCULOSKELETAL: No joint pain or arthritis.   NEUROLOGIC: No tingling, numbness, weakness.  PSYCHIATRY: No anxiety or depression.   DRUG ALLERGIES:   Allergies  Allergen Reactions  . Codeine Nausea And Vomiting  . Morphine And Related Nausea And Vomiting  . Novocain [Procaine] Hives    NO TROUBLE IN OR  . Other Nausea And Vomiting  . Percocet [Oxycodone-Acetaminophen] Nausea And Vomiting  . Sulfa Antibiotics Hives  . Tramadol Nausea And Vomiting    Note: upset stomach Note: upset stomach     VITALS:  Blood pressure 138/74, pulse 100, temperature 98.4 F (36.9 C), temperature source Oral, resp. rate (!) 21, height '5\' 7"'$  (1.702 m), weight 94.9 kg, SpO2 93 %.  PHYSICAL EXAMINATION:  GENERAL:  78 y.o.-year-old patient lying in the bed with no acute distress.  EYES: Pupils equal, round, reactive to light and accommodation. No scleral icterus. Extraocular muscles intact.  HEENT: Head  atraumatic, normocephalic. Oropharynx and nasopharynx clear.  NECK:  Supple, no jugular venous distention. No thyroid enlargement, no tenderness.  LUNGS: Normal breath sounds bilaterally, no wheezing, rales,rhonchi or crepitation. No use of accessory muscles of respiration.  CARDIOVASCULAR: S1, S2 normal. No murmurs, rubs, or gallops.  ABDOMEN: Soft, diffuse generalized tenderness, more tender in the lower abdominal area is present ,no rebound tenderness  nondistended. Bowel sounds present.  EXTREMITIES: No pedal edema, cyanosis, or clubbing.  NEUROLOGIC: Cranial nerves II through XII are intact. Muscle strength weak  in all extremities. Sensation intact. Gait not checked.  PSYCHIATRIC: The patient is alert and oriented x 1-2 .  SKIN: No obvious rash, lesion, or ulcer.    LABORATORY PANEL:   CBC Recent Labs  Lab 11/21/18 0516  WBC 26.8*  HGB 10.9*  HCT 32.9*  PLT 245   ------------------------------------------------------------------------------------------------------------------  Chemistries  Recent Labs  Lab 11/19/18 0328 11/20/18 0603 11/21/18 0516  NA 136 137  --   K 4.2 3.8  --   CL 101 110  --   CO2 23 23  --   GLUCOSE 131* 127*  --   BUN 46* 26*  --   CREATININE 1.63* 1.22 1.19  CALCIUM 9.2 7.9*  --   AST 18  --   --   ALT 13  --   --   ALKPHOS 60  --   --   BILITOT 0.9  --   --    ------------------------------------------------------------------------------------------------------------------  Cardiac Enzymes No results for input(s): TROPONINI in the last 168 hours. ------------------------------------------------------------------------------------------------------------------  RADIOLOGY:  Ct Abdomen  Pelvis Wo Contrast  Result Date: 11/20/2018 CLINICAL DATA:  Abdominal pain, sepsis, elevated lactic acid, leukocytosis EXAM: CT ABDOMEN AND PELVIS WITHOUT CONTRAST TECHNIQUE: Multidetector CT imaging of the abdomen and pelvis was performed following the  standard protocol without IV contrast. COMPARISON:  09/03/2013 FINDINGS: Lower chest: Trace bilateral pleural effusions. Coronary artery calcifications. Hepatobiliary: Coarse contour of the liver. No gallstones, gallbladder wall thickening, or biliary dilatation. Pancreas: Unremarkable. No pancreatic ductal dilatation or surrounding inflammatory changes. Spleen: Normal in size without significant abnormality. Adrenals/Urinary Tract: Adrenal glands are unremarkable. Numerous tiny nonobstructive bilateral renal calculi. No hydronephrosis. Simple cyst of the inferior pole of the right kidney. Bladder is decompressed by Foley catheter. Stomach/Bowel: Stomach is within normal limits. The appendix is not clearly visualized and may be surgically absent. The colon is diffusely distended with wall thickening and adjacent fat stranding, containing fluid to the rectum. Vascular/Lymphatic: Aortic atherosclerosis. No enlarged abdominal or pelvic lymph nodes. Reproductive: No mass or other significant abnormality. Other: Fat and fluid containing right inguinal hernia (series 5, image 38). Trace ascites. 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 IMPRESSION: 1. The colon is diffusely distended with wall thickening and adjacent fat stranding, containing fluid to the rectum. Findings are consistent with nonspecific infectious, inflammatory, or ischemic colitis. There are no particular findings to suggest ischemia such as pneumatosis or portal venous gas. 2.  Trace ascites in the abdomen and pelvis. 3. Coarse contour of the liver, suggesting cirrhosis, however without definitive morphologic stigmata such as splenomegaly or varices. 4.  Nonobstructive bilateral nephrolithiasis. 5.  Aortic Atherosclerosis (ICD10-I70.0). Electronically Signed   By: Eddie Candle M.D.   On: 11/20/2018 15:00   Dg Forearm Right  Result Date: 11/20/2018 CLINICAL DATA:  Right arm pain after fall. EXAM: RIGHT FOREARM - 2 VIEW  COMPARISON:  Right forearm x-rays dated May 02, 2010. FINDINGS: There is no evidence of fracture or other focal bone lesions. Slight cortical irregularity of the radial neck is chronic and unchanged since 2012. Soft tissues are unremarkable. IMPRESSION: No acute osseous abnormality. Electronically Signed   By: Titus Dubin M.D.   On: 11/20/2018 15:12   Dg Wrist Complete Right  Result Date: 11/20/2018 CLINICAL DATA:  Wrist pain after fall. EXAM: RIGHT WRIST - COMPLETE 3+ VIEW COMPARISON:  Right forearm x-rays dated May 02, 2010. Right hand x-rays dated April 26, 2010. FINDINGS: There is no evidence of fracture or dislocation. There is no evidence of arthropathy or other focal bone abnormality. Soft tissues are unremarkable. 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 IMPRESSION: 1.  No acute osseous abnormality. Electronically Signed   By: Titus Dubin M.D.   On: 11/20/2018 15:09    EKG:   Orders placed or performed during the hospital encounter of 11/19/18  . ED EKG  . ED EKG  . EKG 12-Lead  . EKG 12-Lead  . EKG 12-Lead  . EKG 12-Lead    ASSESSMENT AND PLAN:   This is a 78 year old male admitted for sepsis. 1.  Sepsis: The patient met  criteria via tachycardia, tachypnea, leukocytosis and fever.   The patient's lactic acid is also elevated.  Monitor lactic acid levels 1.8--2.9 -1.4-1.3.--2.0 procalcitonin 0.78-0.87  He is hemodynamically stable.   Hydrate aggressively with intravenous fluid.   UA from this encounter is clear.  Continue broad-spectrum antibiotics with Zosyn and Flagyl for double anaerobic coverage White blood cell count is trending up 24.3-26.8.  Blood cultures with no growth so far.  Urine culture with no  growth.  MRSA PCR negative Check a.m. labs  #Acute abdominal pain from acute colitis-infectious versus inflammatory versus ischemic-most likely infectious as patient had 2 episodes of watery diarrhea last night CT abdomen and  pelvis with colitis, nonobstructing nephrolithiasis Continue Zosyn and Flagyl for double anaerobic coverage We will check stool for C. difficile toxin, GI panel, norovirus Enteric precautions GI consult placed  with Dr. Vicente Males he is aware of the consult  #Right upper extremity pain Right wrist and forearm x-rays with no acute findings  # Acute on chronic kidney injury: Likely secondary to obstruction (BPH).  The patient now has an indwelling catheter.   Monitor ins and outs and follow BUN/creatinine for expected improvement in GFR.   Avoid nephrotoxic agents.  Hydrate with intravenous fluid. Baseline creatinine 1.3 -creatinine 1.63--1.19  .  Hypertension: Controlled; continue losartan, metoprolol, clonidine and Spironolactone   BPH: Continue tamsulosin.  Hypothyroidism: Check TSH; continue Synthroid   Hyperlipidemia: Continue statin therapy   DVT prophylaxis: Heparin   GI prophylaxis: PPI per home regimen     All the records are reviewed and case discussed with Care Management/Social Workerr. Management plans discussed with the patient, family and they are in agreement.  CODE STATUS:   TOTAL TIME TAKING CARE OF THIS PATIENT: 35 minutes.   POSSIBLE D/C IN 2  DAYS, DEPENDING ON CLINICAL CONDITION.  Note: This dictation was prepared with Dragon dictation along with smaller phrase technology. Any transcriptional errors that result from this process are unintentional.   Nicholes Mango M.D on 11/21/2018 at 1:29 PM  Between 7am to 6pm - Pager - [phone] After 6pm go to [email or web address] - password [email or web address] [facility]  [facility]  Office  [phone]  CC: Primary care physician; Danelle Berry, NP

## 2018-11-22 ENCOUNTER — Inpatient Hospital Stay: Payer: Medicare HMO

## 2018-11-22 DIAGNOSIS — A0472 Enterocolitis due to Clostridium difficile, not specified as recurrent: Secondary | ICD-10-CM

## 2018-11-22 LAB — MAGNESIUM: Magnesium: 2 mg/dL (ref 1.7–2.4)

## 2018-11-22 LAB — CBC WITH DIFFERENTIAL/PLATELET
Abs Immature Granulocytes: 1.16 10*3/uL — ABNORMAL HIGH (ref 0.00–0.07)
Basophils Absolute: 0.1 10*3/uL (ref 0.0–0.1)
Basophils Relative: 0 %
Eosinophils Absolute: 0 10*3/uL (ref 0.0–0.5)
Eosinophils Relative: 0 %
HCT: 32.9 % — ABNORMAL LOW (ref 39.0–52.0)
Hemoglobin: 11 g/dL — ABNORMAL LOW (ref 13.0–17.0)
Immature Granulocytes: 4 %
Lymphocytes Relative: 4 %
Lymphs Abs: 1.1 10*3/uL (ref 0.7–4.0)
MCH: 34.4 pg — ABNORMAL HIGH (ref 26.0–34.0)
MCHC: 33.4 g/dL (ref 30.0–36.0)
MCV: 102.8 fL — ABNORMAL HIGH (ref 80.0–100.0)
Monocytes Absolute: 1 10*3/uL (ref 0.1–1.0)
Monocytes Relative: 4 %
Neutro Abs: 26.2 10*3/uL — ABNORMAL HIGH (ref 1.7–7.7)
Neutrophils Relative %: 88 %
Platelets: 246 10*3/uL (ref 150–400)
RBC: 3.2 MIL/uL — ABNORMAL LOW (ref 4.22–5.81)
RDW: 14 % (ref 11.5–15.5)
Smear Review: NORMAL
WBC: 29.6 10*3/uL — ABNORMAL HIGH (ref 4.0–10.5)
nRBC: 0 % (ref 0.0–0.2)

## 2018-11-22 LAB — COMPREHENSIVE METABOLIC PANEL
ALT: 15 U/L (ref 0–44)
AST: 23 U/L (ref 15–41)
Albumin: 1.9 g/dL — ABNORMAL LOW (ref 3.5–5.0)
Alkaline Phosphatase: 71 U/L (ref 38–126)
Anion gap: 5 (ref 5–15)
BUN: 25 mg/dL — ABNORMAL HIGH (ref 8–23)
CO2: 21 mmol/L — ABNORMAL LOW (ref 22–32)
Calcium: 7.6 mg/dL — ABNORMAL LOW (ref 8.9–10.3)
Chloride: 110 mmol/L (ref 98–111)
Creatinine, Ser: 1.03 mg/dL (ref 0.61–1.24)
GFR calc Af Amer: >60 mL/min (ref >60)
GFR calc non Af Amer: >60 mL/min (ref >60)
Glucose, Bld: 118 mg/dL — ABNORMAL HIGH (ref 70–99)
Potassium: 3.6 mmol/L (ref 3.5–5.1)
Sodium: 136 mmol/L (ref 135–145)
Total Bilirubin: 0.5 mg/dL (ref 0.3–1.2)
Total Protein: 4.9 g/dL — ABNORMAL LOW (ref 6.5–8.1)

## 2018-11-22 LAB — PHOSPHORUS: Phosphorus: 2.2 mg/dL — ABNORMAL LOW (ref 2.5–4.6)

## 2018-11-22 LAB — TSH: TSH: 1.672 u[IU]/mL (ref 0.350–4.500)

## 2018-11-22 LAB — CLOSTRIDIUM DIFFICILE BY PCR, REFLEXED: Toxigenic C. Difficile by PCR: POSITIVE — AB

## 2018-11-22 LAB — PROCALCITONIN: Procalcitonin: 0.71 ng/mL

## 2018-11-22 MED ORDER — BOOST / RESOURCE BREEZE PO LIQD CUSTOM
1.0000 | Freq: Three times a day (TID) | ORAL | Status: DC
  Administered 2018-11-22 – 2018-12-02 (×12): 1 via ORAL

## 2018-11-22 MED ORDER — VANCOMYCIN HCL 500 MG IV SOLR
500.0000 mg | Freq: Four times a day (QID) | Status: DC
  Administered 2018-11-22 – 2018-11-25 (×13): 500 mg via RECTAL
  Filled 2018-11-22 (×18): qty 500

## 2018-11-22 MED ORDER — PHENOL 1.4 % MT LIQD
1.0000 | OROMUCOSAL | Status: DC | PRN
  Administered 2018-11-23 (×2): 1 via OROMUCOSAL
  Filled 2018-11-22: qty 177

## 2018-11-22 NOTE — Progress Notes (Signed)
[place] [facility] [place] NAME: Clee Comar    MR#:  [id number]  DATE OF BIRTH:  1941-02-10  SUBJECTIVE:  CHIEF COMPLAINT: Patient is still sick looking reporting diffuse abdominal discomfort and lower abdominal pain. Continues to have episodes of watery diarrhea.  Decreased appetite. Patient is supposed to see urologist as an outpatient but could not make it and he received a p.o. Keflex one-time dose prior to the admission per son's report REVIEW OF SYSTEMS:  CONSTITUTIONAL: has  fever, no fatigue or weakness.  EYES: No blurred or double vision.  EARS, NOSE, AND THROAT: No tinnitus or ear pain.  RESPIRATORY: No cough, shortness of breath, wheezing or hemoptysis.  CARDIOVASCULAR: No chest pain, orthopnea, edema.  GASTROINTESTINAL: No nausea, vomiting, diarrhea +, reporting suprapubic abdominal pain.  HEMATOLOGY: No anemia, easy bruising or bleeding SKIN: No rash or lesion. MUSCULOSKELETAL: No joint pain or arthritis.   NEUROLOGIC: No tingling, numbness, weakness.  PSYCHIATRY: No anxiety or depression. DRUG ALLERGIES:   Allergies  Allergen Reactions  . Codeine Nausea And Vomiting  . Morphine And Related Nausea And Vomiting  . Novocain [Procaine] Hives    NO TROUBLE IN OR  . Other Nausea And Vomiting  . Percocet [Oxycodone-Acetaminophen] Nausea And Vomiting  . Sulfa Antibiotics Hives  . Tramadol Nausea And Vomiting    Note: upset stomach Note: upset stomach     VITALS:  Blood pressure (!) 142/81, pulse (!) 103, temperature 98.1 F (36.7 C), temperature source Oral, resp. rate 18, height 5\' 7"  (1.702 m), weight 96.8 kg, SpO2 93 %.  PHYSICAL EXAMINATION:  GENERAL:  78 y.o.-year-old patient lying in the bed with no acute distress.  EYES: Pupils equal, round, reactive to light and accommodation. No scleral icterus. Extraocular muscles intact.  HEENT: Head atraumatic, normocephalic. Oropharynx and nasopharynx clear.  NECK:  Supple,  no jugular venous distention. No thyroid enlargement, no tenderness.  LUNGS: Normal breath sounds bilaterally, no wheezing, rales,rhonchi or crepitation. No use of accessory muscles of respiration.  CARDIOVASCULAR: S1, S2 normal. No murmurs, rubs, or gallops.  ABDOMEN: Soft, diffuse generalized tenderness, more tender in the lower abdominal area is present ,no rebound tenderness  nondistended. Bowel sounds present.  EXTREMITIES: No pedal edema, cyanosis, or clubbing.  NEUROLOGIC: Cranial nerves II through XII are intact. Muscle strength weak  in all extremities. Sensation intact. Gait not checked.  PSYCHIATRIC: The patient is alert and oriented x 1-2 .  SKIN: No obvious rash, lesion, or ulcer.  LABORATORY PANEL:   CBC Recent Labs  Lab 11/22/18 0510  WBC 29.6*  HGB 11.0*  HCT 32.9*  PLT 246   ------------------------------------------------------------------------------------------------------------------  Chemistries  Recent Labs  Lab 11/22/18 0510  NA 136  K 3.6  CL 110  CO2 21*  GLUCOSE 118*  BUN 25*  CREATININE 1.03  CALCIUM 7.6*  AST 23  ALT 15  ALKPHOS 71  BILITOT 0.5   ------------------------------------------------------------------------------------------------------------------  Cardiac Enzymes No results for input(s): TROPONINI in the last 168 hours. ------------------------------------------------------------------------------------------------------------------  RADIOLOGY:  Dg Abd 1 View  Result Date: 11/22/2018 CLINICAL DATA:  Abdominal distension. EXAM: ABDOMEN - 1 VIEW COMPARISON:  May 21, 2017. FINDINGS: Dilated air-filled colon is noted. No definite small bowel dilatation is noted. No abnormal calcifications are noted. IMPRESSION: Dilated air-filled colon is noted most consistent with ileus. Electronically Signed   By: Marijo Conception M.D.   On: 11/22/2018 12:45   EKG:   Orders placed or performed during the hospital  encounter of 11/19/18  . ED  EKG  . ED EKG  . EKG 12-Lead  . EKG 12-Lead  . EKG 12-Lead  . EKG 12-Lead    ASSESSMENT AND PLAN:  This is a 78 year old male admitted for sepsis.  1.  Sepsis: present on admission  The patient's lactic acid is also elevated.  Monitor lactic acid levels 1.8--2.9 -1.4-1.3.--2.0 procalcitonin 0.78-0.87 -  Hydrate aggressively with intravenous fluid.   UA from this encounter is clear. -I think his leukocytosis is due to C. difficile than any other infection.  We will stop all antibiotics at this time  - infectious disease is following White blood cell count is trending up 24.3-26.8->29.6  Blood cultures with no growth so far.  Urine culture with no growth.  MRSA PCR negative  #Suspected C. difficile colitis-patient continues to have loose watery diarrhea CT abdomen and pelvis with colitis, nonobstructing nephrolithiasis Continue IV Flagyl, adding vancomycin enema for better penetration, continue oral vancomycin - C. difficile antigen is positive but toxin is negative, pending stool GI panel, norovirus Enteric precautions GI consult pending - Dr Marius Ditch aware -Getting KUB.  Started clear liquid per GI.  Will consult surgery  #Right upper extremity pain Right wrist and forearm x-rays with no acute findings  # Acute on chronic kidney injury: Likely secondary to obstruction (BPH).  Now resolved - The patient now has an indwelling catheter.   Monitor ins and outs and follow BUN/creatinine for expected improvement in GFR.   Avoid nephrotoxic agents.  Hydrate with intravenous fluid.  * Hypertension: Controlled; continue losartan, metoprolol, clonidine and Spironolactone   BPH: Continue tamsulosin.  Hypothyroidism: continue Synthroid   Hyperlipidemia: Continue statin therapy   DVT prophylaxis: Heparin   GI prophylaxis: PPI per home regimen  He looks critically sick and high risk for cardiorespiratory failure, multiorgan failure and death.  Son is updated over phone   All the records  are reviewed and case discussed with Care Management/Social Workerr. Management plans discussed with the patient, family (d/w son Own over Phone [phone]) and they are in agreement.  CODE STATUS:   TOTAL TIME (critical care) TAKING CARE OF THIS PATIENT: 35 minutes.   POSSIBLE D/C IN 2-3 DAYS, DEPENDING ON CLINICAL CONDITION.  Note: This dictation was prepared with Dragon dictation along with smaller phrase technology. Any transcriptional errors that result from this process are unintentional.   Max Sane M.D on 11/22/2018 at 1:21 PM  Between 7am to 6pm - Pager - [phone] After 6pm go to [email or web address] - password [email or web address]  [facility]  Office  [phone]  CC: Primary care physician; Danelle Berry, NP

## 2018-11-22 NOTE — Consult Note (Signed)
Reason for Consult: Clostridium difficile colitis Referring Physician: Max Sane, MD (hospitalist)  Bobby Lane is an 78 y.o. male.  HPI: He presented to the emergency room on 11/19/2018 with altered mental status.  Recent history of UTI.  He has been taking antibiotics but presented to the emergency room febrile. Underwent a CT scan of the abdomen and pelvis.  Noted to have a colon that is diffusely distended with wall thickening consistent with colitis.  Trace ascites seen in the abdomen and pelvis.  Coarse contour of the liver suggestive of cirrhosis.  He has been having diarrhea.  C. difficile was positive.  Gastroenterology and infectious disease have also been consulted.  His white blood count is up to 29,000.  Lactic acid has not been checked in 24 hours.  Procalcitonin is equivocal.  He is currently getting oral vancomycin and IV metronidazole.  His abdomen was thought to be more distended today, and therefore general surgery is consulted in this setting to evaluate for toxic megacolon.  Patient interview is compromised somewhat by his hearing loss and being a poor historian.  Much of the history is taken from the electronic medical record. He tells me that his abdomen only hurts when he has a bowel movement.  Past Medical History:  Diagnosis Date  . Arthritis   . Cancer ([place])    SKIN  . COPD (chronic obstructive pulmonary disease) ([place])   . Coronary artery disease   . Dyspnea    DOE  . GERD (gastroesophageal reflux disease)   . Gout   . Heart murmur   . History of hiatal hernia   . History of kidney stones   . History of wheezing   . HOH (hard of hearing)   . Hypertension   . Hypothyroidism   . Oxygen deficit    PRN USE  . Renal disorder   . Thyroid disease     Past Surgical History:  Procedure Laterality Date  . CATARACT EXTRACTION W/PHACO Right 08/21/2016   Procedure: CATARACT EXTRACTION PHACO AND INTRAOCULAR LENS PLACEMENT (IOC);  Surgeon: Birder Robson, MD;   Location: [facility] ORS;  Service: Ophthalmology;  Laterality: Right;  [place] 00:54.9 AP% 18.3 CDE 10.04 Fluid pack lot # [id number]  . CATARACT EXTRACTION W/PHACO Left 09/18/2016   Procedure: CATARACT EXTRACTION PHACO AND INTRAOCULAR LENS PLACEMENT (IOC);  Surgeon: Birder Robson, MD;  Location: [facility] ORS;  Service: Ophthalmology;  Laterality: Left;  [place] 00:56 AP% 19.7 CDE 11.07 Fluid pack lot # [id number]  . FRACTURE SURGERY     ANKLE  . HERNIA REPAIR    . UPPER GI ENDOSCOPY      Family History  Problem Relation Age of Onset  . Prostate cancer Neg Hx   . Bladder Cancer Neg Hx   . Kidney cancer Neg Hx     Social History:  reports that he has never smoked. He has never used smokeless tobacco. He reports that he does not drink alcohol or use drugs.  Allergies:  Allergies  Allergen Reactions  . Codeine Nausea And Vomiting  . Morphine And Related Nausea And Vomiting  . Novocain [Procaine] Hives    NO TROUBLE IN OR  . Other Nausea And Vomiting  . Percocet [Oxycodone-Acetaminophen] Nausea And Vomiting  . Sulfa Antibiotics Hives  . Tramadol Nausea And Vomiting    Note: upset stomach Note: upset stomach     Medications:  Prior to Admission:  Medications Prior to Admission  Medication Sig Dispense Refill Last Dose  . allopurinol (ZYLOPRIM)  300 MG tablet Take 300 mg by mouth daily.   11/18/2018 at 0800  . amoxicillin-clavulanate (AUGMENTIN) 875-125 MG tablet Take 1 tablet by mouth every 12 (twelve) hours. 14 tablet 0 11/18/2018 at 2000  . cetirizine (ZYRTEC) 10 MG tablet Take 10 mg by mouth at bedtime.    11/17/2018 at 2000  . Cholecalciferol (VITAMIN D3) 5000 UNITS TABS Take 5,000 Units by mouth daily.   11/18/2018 at 0800  . clonazePAM (KLONOPIN) 0.5 MG tablet Take 0.5 mg by mouth at bedtime as needed for anxiety.    11/17/2018 at 2000  . cloNIDine (CATAPRES) 0.2 MG tablet Take 0.1 mg by mouth 2 (two) times daily.   11/18/2018 at 2000  . DULoxetine (CYMBALTA) 30 MG capsule Take 30 mg by mouth  2 (two) times daily.    11/18/2018 at 2000  . losartan (COZAAR) 100 MG tablet Take 100 mg by mouth daily.   11/18/2018 at 0800  . metoprolol succinate (TOPROL-XL) 100 MG 24 hr tablet Take 100 mg by mouth daily.   11/18/2018 at 2000  . pantoprazole (PROTONIX) 40 MG tablet Take 40 mg by mouth daily.   11/18/2018 at 0800  . rosuvastatin (CRESTOR) 5 MG tablet Take 5 mg by mouth every other day.   Past Week at Unknown time  . spironolactone (ALDACTONE) 25 MG tablet Take 25 mg by mouth daily.   11/18/2018 at 0800  . sucralfate (CARAFATE) 1 g tablet Take 1 g by mouth 4 (four) times daily.   Past Week at prn  . SYNTHROID 100 MCG tablet Take 100 mcg by mouth daily before breakfast.    11/18/2018 at 0630  . tamsulosin (FLOMAX) 0.4 MG CAPS capsule Take 1 capsule (0.4 mg total) by mouth daily. 30 capsule 0 11/18/2018 at 1800  . metroNIDAZOLE (FLAGYL) 500 MG tablet    Not Taking at Unknown time  . sodium citrate-citric acid (ORACIT) 500-334 MG/5ML solution Take 30 mLs by mouth 2 (two) times daily after a meal. (Patient not taking: Reported on 11/19/2018) 473 mL 2 Not Taking at Unknown time  . valACYclovir (VALTREX) 1000 MG tablet TAKE 2 TABLETS BY MOUTH FOR 1 DOSE NOW THEN REPEAT 2 TABLETS IN 12 HOURS   prn at prn    Results for orders placed or performed during the hospital encounter of 11/19/18 (from the past 48 hour(s))  Lactic acid, plasma     Status: Abnormal   Collection Time: 11/20/18  2:18 PM  Result Value Ref Range   Lactic Acid, Venous 2.0 (HH) 0.5 - 1.9 mmol/L    Comment: CRITICAL RESULT CALLED TO, READ BACK BY AND VERIFIED WITH SERENITY KIRKENDOL 11/20/2018 1448 KMP/DAS Performed at [facility], [address] [place], [place] [address]   Blood gas, arterial     Status: Abnormal   Collection Time: 11/20/18  7:36 PM  Result Value Ref Range   FIO2 0.21    pH, Arterial 7.44 7.350 - 7.450   pCO2 arterial 27 (L) 32.0 - 48.0 mmHg   pO2, Arterial 79 (L) 83.0 - 108.0 mmHg   Bicarbonate 18.3  (L) 20.0 - 28.0 mmol/L   Acid-base deficit 4.4 (H) 0.0 - 2.0 mmol/L   O2 Saturation 96.0 %   Patient temperature 37.0    Collection site RIGHT RADIAL    Sample type ARTERIAL DRAW    Allens test (pass/fail) PASS PASS    Comment: Performed at [facility], [address], [place], [place] [address]  CBC with Differential/Platelet  Status: Abnormal   Collection Time: 11/21/18  5:16 AM  Result Value Ref Range   WBC 26.8 (H) 4.0 - 10.5 K/uL   RBC 3.19 (L) 4.22 - 5.81 MIL/uL   Hemoglobin 10.9 (L) 13.0 - 17.0 g/dL   HCT 32.9 (L) 39.0 - 52.0 %   MCV 103.1 (H) 80.0 - 100.0 fL   MCH 34.2 (H) 26.0 - 34.0 pg   MCHC 33.1 30.0 - 36.0 g/dL   RDW 13.9 11.5 - 15.5 %   Platelets 245 150 - 400 K/uL   nRBC 0.0 0.0 - 0.2 %   Neutrophils Relative % 92 %   Neutro Abs 24.6 (H) 1.7 - 7.7 K/uL   Lymphocytes Relative 3 %   Lymphs Abs 0.8 0.7 - 4.0 K/uL   Monocytes Relative 4 %   Monocytes Absolute 1.1 (H) 0.1 - 1.0 K/uL   Eosinophils Relative 0 %   Eosinophils Absolute 0.0 0.0 - 0.5 K/uL   Basophils Relative 0 %   Basophils Absolute 0.0 0.0 - 0.1 K/uL   WBC Morphology DOHLE BODIES     Comment: VACUOLATED NEUTROPHILS   RBC Morphology MORPHOLOGY UNREMARKABLE    Smear Review Normal platelet morphology    Immature Granulocytes 1 %   Abs Immature Granulocytes 0.23 (H) 0.00 - 0.07 K/uL    Comment: Performed at [facility], [address] [place], [place] [address]  Procalcitonin     Status: None   Collection Time: 11/21/18  5:16 AM  Result Value Ref Range   Procalcitonin 0.87 ng/mL    Comment:        Interpretation: PCT > 0.5 ng/mL and <= 2 ng/mL: Systemic infection (sepsis) is possible, but other conditions are known to elevate PCT as well. (NOTE)       Sepsis PCT Algorithm           Lower Respiratory Tract                                      Infection PCT Algorithm    ----------------------------     ----------------------------         PCT < 0.25 ng/mL                 PCT < 0.10 ng/mL         Strongly encourage             Strongly discourage   discontinuation of antibiotics    initiation of antibiotics    ----------------------------     -----------------------------       PCT 0.25 - 0.50 ng/mL            PCT 0.10 - 0.25 ng/mL               OR       >80% decrease in PCT            Discourage initiation of                                            antibiotics      Encourage discontinuation           of antibiotics    ----------------------------     -----------------------------         PCT >= 0.50 ng/mL  PCT 0.26 - 0.50 ng/mL                AND       <80% decrease in PCT             Encourage initiation of                                             antibiotics       Encourage continuation           of antibiotics    ----------------------------     -----------------------------        PCT >= 0.50 ng/mL                  PCT > 0.50 ng/mL               AND         increase in PCT                  Strongly encourage                                      initiation of antibiotics    Strongly encourage escalation           of antibiotics                                     -----------------------------                                           PCT <= 0.25 ng/mL                                                 OR                                        > 80% decrease in PCT                                     Discontinue / Do not initiate                                             antibiotics Performed at [facility], [address] [place], [place] [address]   Creatinine, serum     Status: Abnormal   Collection Time: 11/21/18  5:16 AM  Result Value Ref Range   Creatinine, Ser 1.19 0.61 - 1.24 mg/dL   GFR calc non Af Amer 59 (L) >60 mL/min   GFR calc Af Amer >60 >60 mL/min    Comment: Performed at [facility], [address] [place], [place] [address]  Lactic acid,  plasma     Status: None   Collection Time:  11/21/18 11:20 AM  Result Value Ref Range   Lactic Acid, Venous 1.2 0.5 - 1.9 mmol/L    Comment: Performed at [facility], [address] [place], [place] [address]  Lactic acid, plasma     Status: None   Collection Time: 11/21/18  2:45 PM  Result Value Ref Range   Lactic Acid, Venous 1.8 0.5 - 1.9 mmol/L    Comment: Performed at [facility], [address] [place], [place] [address]  C difficile quick scan w PCR reflex     Status: Abnormal   Collection Time: 11/21/18  6:26 PM   Specimen: STOOL  Result Value Ref Range   C Diff antigen POSITIVE (A) NEGATIVE   C Diff toxin NEGATIVE NEGATIVE   C Diff interpretation Results are indeterminate. See PCR results.     Comment: Performed at [facility], [address] [place], [place] [address]  C. Diff by PCR, Reflexed     Status: Abnormal   Collection Time: 11/21/18  6:26 PM  Result Value Ref Range   Toxigenic C. Difficile by PCR POSITIVE (A) NEGATIVE    Comment: Positive for toxigenic C. difficile with little to no toxin production. Only treat if clinical presentation suggests symptomatic illness. Performed at [facility], [address] [place], [place] [address]   CBC with Differential/Platelet     Status: Abnormal   Collection Time: 11/22/18  5:10 AM  Result Value Ref Range   WBC 29.6 (H) 4.0 - 10.5 K/uL   RBC 3.20 (L) 4.22 - 5.81 MIL/uL   Hemoglobin 11.0 (L) 13.0 - 17.0 g/dL   HCT 32.9 (L) 39.0 - 52.0 %   MCV 102.8 (H) 80.0 - 100.0 fL   MCH 34.4 (H) 26.0 - 34.0 pg   MCHC 33.4 30.0 - 36.0 g/dL   RDW 14.0 11.5 - 15.5 %   Platelets 246 150 - 400 K/uL   nRBC 0.0 0.0 - 0.2 %   Neutrophils Relative % 88 %   Neutro Abs 26.2 (H) 1.7 - 7.7 K/uL   Lymphocytes Relative 4 %   Lymphs Abs 1.1 0.7 - 4.0 K/uL   Monocytes Relative 4 %   Monocytes Absolute 1.0 0.1 - 1.0 K/uL   Eosinophils Relative 0 %   Eosinophils Absolute 0.0 0.0 - 0.5 K/uL   Basophils Relative 0 %   Basophils Absolute 0.1  0.0 - 0.1 K/uL   WBC Morphology DOHLE BODIES     Comment: TOXIC GRANULATION VACUOLATED NEUTROPHILS    RBC Morphology MORPHOLOGY UNREMARKABLE    Smear Review Normal platelet morphology    Immature Granulocytes 4 %   Abs Immature Granulocytes 1.16 (H) 0.00 - 0.07 K/uL    Comment: Performed at [facility], [address] [place],  [address]  Procalcitonin     Status: None   Collection Time: 11/22/18  5:10 AM  Result Value Ref Range   Procalcitonin 0.71 ng/mL    Comment:        Interpretation: PCT > 0.5 ng/mL and <= 2 ng/mL: Systemic infection (sepsis) is possible, but other conditions are known to elevate PCT as well. (NOTE)       Sepsis PCT Algorithm           Lower Respiratory Tract  Infection PCT Algorithm    ----------------------------     ----------------------------         PCT < 0.25 ng/mL                PCT < 0.10 ng/mL         Strongly encourage             Strongly discourage   discontinuation of antibiotics    initiation of antibiotics    ----------------------------     -----------------------------       PCT 0.25 - 0.50 ng/mL            PCT 0.10 - 0.25 ng/mL               OR       >80% decrease in PCT            Discourage initiation of                                            antibiotics      Encourage discontinuation           of antibiotics    ----------------------------     -----------------------------         PCT >= 0.50 ng/mL              PCT 0.26 - 0.50 ng/mL                AND       <80% decrease in PCT             Encourage initiation of                                             antibiotics       Encourage continuation           of antibiotics    ----------------------------     -----------------------------        PCT >= 0.50 ng/mL                  PCT > 0.50 ng/mL               AND         increase in PCT                  Strongly encourage                                      initiation  of antibiotics    Strongly encourage escalation           of antibiotics                                     -----------------------------                                           PCT <= 0.25 ng/mL  OR                                        > 80% decrease in PCT                                     Discontinue / Do not initiate                                             antibiotics Performed at [facility], [address] [place], [place] [address]   Comprehensive metabolic panel     Status: Abnormal   Collection Time: 11/22/18  5:10 AM  Result Value Ref Range   Sodium 136 135 - 145 mmol/L   Potassium 3.6 3.5 - 5.1 mmol/L   Chloride 110 98 - 111 mmol/L   CO2 21 (L) 22 - 32 mmol/L   Glucose, Bld 118 (H) 70 - 99 mg/dL   BUN 25 (H) 8 - 23 mg/dL   Creatinine, Ser 1.03 0.61 - 1.24 mg/dL   Calcium 7.6 (L) 8.9 - 10.3 mg/dL   Total Protein 4.9 (L) 6.5 - 8.1 g/dL   Albumin 1.9 (L) 3.5 - 5.0 g/dL   AST 23 15 - 41 U/L   ALT 15 0 - 44 U/L   Alkaline Phosphatase 71 38 - 126 U/L   Total Bilirubin 0.5 0.3 - 1.2 mg/dL   GFR calc non Af Amer >60 >60 mL/min   GFR calc Af Amer >60 >60 mL/min   Anion gap 5 5 - 15    Comment: Performed at [facility], [address] [place], [place] [address]  TSH     Status: None   Collection Time: 11/22/18  5:10 AM  Result Value Ref Range   TSH 1.672 0.350 - 4.500 uIU/mL    Comment: Performed by a 3rd Generation assay with a functional sensitivity of <=0.01 uIU/mL. Performed at [facility], [address] [place], [place] [address]     Dg Abd 1 View  Result Date: 11/22/2018 CLINICAL DATA:  Abdominal distension. EXAM: ABDOMEN - 1 VIEW COMPARISON:  May 21, 2017. FINDINGS: Dilated air-filled colon is noted. No definite small bowel dilatation is noted. No abnormal calcifications are noted. IMPRESSION: Dilated air-filled colon is noted most consistent with ileus. Electronically  Signed   By: Marijo Conception M.D.   On: 11/22/2018 12:45    Review of Systems  Unable to perform ROS: Mental acuity   Blood pressure (!) 142/81, pulse (!) 103, temperature 98.1 F (36.7 C), temperature source Oral, resp. rate 18, height 5\' 7"  (1.702 m), weight 96.8 kg, SpO2 93 %. Physical Exam  Constitutional: He appears well-developed and well-nourished. No distress.  HENT:  Head: Normocephalic.  Poor dentition.  Scrape or bruise on the left nasal bridge.  Eyes: Right eye exhibits no discharge. Left eye exhibits no discharge. No scleral icterus.  Neck: No tracheal deviation present.  Cardiovascular: Regular rhythm.  Respiratory: Effort normal. No stridor. No respiratory distress.  GI: Soft. He exhibits distension. There is abdominal tenderness. There is guarding. There is no rebound.  Bowel sounds are hyperactive.  Voluntary guarding.  No rebound.  Tender to deep  palpation.  Genitourinary:    Genitourinary Comments: Deferred.  Foley in place.   Musculoskeletal:        General: No deformity.  Neurological: He is alert.  Skin: Skin is warm and dry.  Psychiatric: He has a normal mood and affect.    Assessment/Plan: This is a 78 year old man with colonic dilation and C. difficile colitis.  His abdomen is tender, but all guarding is voluntary; no true peritoneal signs. Today's abdominal film does not show any thumbprinting or suggestion of pneumatosis/perforation.  Haustral markings are still present.  Recommend continuing antibiotic therapy, consider adding vancomycin enemas and/or Dificid.  Trend lactic acid, procalcitonin, and white blood cell count.  Serial abdominal exams and daily abdominal imaging.  Consider nasoenteric decompression, though less likely to be of benefit in isolated colonic dilation.  Will continue to follow.  Fredirick Maudlin 11/22/2018, 1:21 PM

## 2018-11-22 NOTE — Progress Notes (Signed)
Bobby Darby, MD [address]  [place]  [place], [place] [address]  Main: [phone]  Fax: [phone] Pager: [phone]   Subjective: Patient was seen by me with his nurse bedside at 11 AM.  She was lethargic but answering questions appropriately.  His nurse reported that he is little confused.  Patient reports diffuse abdominal pain.  He has not been eating much.  He has been afebrile.  Hemodynamically stable with no acute events overnight.  He had 2 brown loose bowel movements earlier this morning.  Patient reports a lot of gurgling and has been passing significant amount of gas.   Objective: Vital signs in last 24 hours: Vitals:   11/21/18 2123 11/22/18 0500 11/22/18 0728 11/22/18 1033  BP: (!) 144/67  (!) 146/63 (!) 142/81  Pulse: 82  87 (!) 103  Resp: 20  18 18   Temp: 98.6 F (37 C)  99.2 F (37.3 C) 98.1 F (36.7 C)  TempSrc: Oral  Oral Oral  SpO2: 96%  95% 93%  Weight:  96.8 kg    Height:       Weight change: 1.862 kg  Intake/Output Summary (Last 24 hours) at 11/22/2018 1552 Last data filed at 11/22/2018 0700 Gross per 24 hour  Intake 1470.44 ml  Output 550 ml  Net 920.44 ml     Exam: Heart:: Regular rate and rhythm, S1S2 present or without murmur or extra heart sounds Lungs: normal and clear to auscultation Abdomen: Soft, moderately distended, diffusely tender, hyperactive bowel sounds  Lab Results: CBC Latest Ref Rng & Units 11/22/2018 11/21/2018 11/20/2018  WBC 4.0 - 10.5 K/uL 29.6(H) 26.8(H) 24.3(H)  Hemoglobin 13.0 - 17.0 g/dL 11.0(L) 10.9(L) 11.1(L)  Hematocrit 39.0 - 52.0 % 32.9(L) 32.9(L) 34.2(L)  Platelets 150 - 400 K/uL 246 245 257   CMP Latest Ref Rng & Units 11/22/2018 11/21/2018 11/20/2018  Glucose 70 - 99 mg/dL 118(H) - 127(H)  BUN 8 - 23 mg/dL 25(H) - 26(H)  Creatinine 0.61 - 1.24 mg/dL 1.03 1.19 1.22  Sodium 135 - 145 mmol/L 136 - 137  Potassium 3.5 - 5.1 mmol/L 3.6 - 3.8  Chloride 98 - 111 mmol/L 110 - 110  CO2 22 - 32  mmol/L 21(L) - 23  Calcium 8.9 - 10.3 mg/dL 7.6(L) - 7.9(L)  Total Protein 6.5 - 8.1 g/dL 4.9(L) - -  Total Bilirubin 0.3 - 1.2 mg/dL 0.5 - -  Alkaline Phos 38 - 126 U/L 71 - -  AST 15 - 41 U/L 23 - -  ALT 0 - 44 U/L 15 - -    Micro Results: Recent Results (from the past 240 hour(s))  Blood Culture (routine x 2)     Status: None   Collection Time: 11/13/18 11:00 PM   Specimen: BLOOD  Result Value Ref Range Status   Specimen Description BLOOD RIGHT ASSIST CONTROL  Final   Special Requests   Final    BOTTLES DRAWN AEROBIC AND ANAEROBIC Blood Culture results may not be optimal due to an excessive volume of blood received in culture bottles   Culture   Final    NO GROWTH 5 DAYS Performed at [facility], [address] [place], [place] [address]    Report Status 11/18/2018 FINAL  Final  Blood Culture (routine x 2)     Status: None   Collection Time: 11/13/18 11:00 PM   Specimen: BLOOD  Result Value Ref Range Status   Specimen Description BLOOD LEFT ANTECUBITAL  Final  Special Requests   Final    BOTTLES DRAWN AEROBIC AND ANAEROBIC Blood Culture results may not be optimal due to an excessive volume of blood received in culture bottles   Culture   Final    NO GROWTH 5 DAYS Performed at [facility], [address] [place], [place] [address]    Report Status 11/18/2018 FINAL  Final  SARS Coronavirus 2 [facility] order, Performed in [facility] hospital lab) Nasopharyngeal Nasopharyngeal Swab     Status: None   Collection Time: 11/13/18 11:22 PM   Specimen: Nasopharyngeal Swab  Result Value Ref Range Status   SARS Coronavirus 2 NEGATIVE NEGATIVE Final    Comment: (NOTE) If result is NEGATIVE SARS-CoV-2 target nucleic acids are NOT DETECTED. The SARS-CoV-2 RNA is generally detectable in upper and lower  respiratory specimens during the acute phase of infection. The lowest  concentration of SARS-CoV-2 viral copies this assay can detect is 250  copies / mL. A  negative result does not preclude SARS-CoV-2 infection  and should not be used as the sole basis for treatment or other  patient management decisions.  A negative result may occur with  improper specimen collection / handling, submission of specimen other  than nasopharyngeal swab, presence of viral mutation(s) within the  areas targeted by this assay, and inadequate number of viral copies  (<250 copies / mL). A negative result must be combined with clinical  observations, patient history, and epidemiological information. If result is POSITIVE SARS-CoV-2 target nucleic acids are DETECTED. The SARS-CoV-2 RNA is generally detectable in upper and lower  respiratory specimens dur ing the acute phase of infection.  Positive  results are indicative of active infection with SARS-CoV-2.  Clinical  correlation with patient history and other diagnostic information is  necessary to determine patient infection status.  Positive results do  not rule out bacterial infection or co-infection with other viruses. If result is PRESUMPTIVE POSTIVE SARS-CoV-2 nucleic acids MAY BE PRESENT.   A presumptive positive result was obtained on the submitted specimen  and confirmed on repeat testing.  While 2019 novel coronavirus  (SARS-CoV-2) nucleic acids may be present in the submitted sample  additional confirmatory testing may be necessary for epidemiological  and / or clinical management purposes  to differentiate between  SARS-CoV-2 and other Sarbecovirus currently known to infect humans.  If clinically indicated additional testing with an alternate test  methodology [phone]) is advised. The SARS-CoV-2 RNA is generally  detectable in upper and lower respiratory sp ecimens during the acute  phase of infection. The expected result is Negative. Fact Sheet for Patients:  StrictlyIdeas.no Fact Sheet for Healthcare Providers: BankingDealers.co.za This test is not  yet approved or cleared by the [place] FDA and has been authorized for detection and/or diagnosis of SARS-CoV-2 by FDA under an Emergency Use Authorization (EUA).  This EUA will remain in effect (meaning this test can be used) for the duration of the COVID-19 declaration under Section 564(b)(1) of the Act, 21 U.S.C. section 360bbb-3(b)(1), unless the authorization is terminated or revoked sooner. Performed at [facility], [address], [place], [place] [address]   Urine culture     Status: Abnormal   Collection Time: 11/14/18 12:18 AM   Specimen: In/Out Cath Urine  Result Value Ref Range Status   Specimen Description   Final    IN/OUT CATH URINE Performed at [facility], [address], [place], [place] [address]    Special Requests   Final  NONE Performed at [facility], [address] [place], [place] [address]    Culture >=100,000 COLONIES/mL ENTEROCOCCUS FAECALIS (A)  Final   Report Status 11/15/2018 FINAL  Final   Organism ID, Bacteria ENTEROCOCCUS FAECALIS (A)  Final      Susceptibility   Enterococcus faecalis - MIC*    AMPICILLIN <=2 SENSITIVE Sensitive     LEVOFLOXACIN 2 SENSITIVE Sensitive     NITROFURANTOIN <=16 SENSITIVE Sensitive     VANCOMYCIN 1 SENSITIVE Sensitive     * >=100,000 COLONIES/mL ENTEROCOCCUS FAECALIS  Culture, blood (routine x 2)     Status: None (Preliminary result)   Collection Time: 11/19/18  3:28 AM   Specimen: BLOOD  Result Value Ref Range Status   Specimen Description BLOOD LEFT FA  Final   Special Requests   Final    BOTTLES DRAWN AEROBIC AND ANAEROBIC Blood Culture results may not be optimal due to an excessive volume of blood received in culture bottles   Culture   Final    NO GROWTH 3 DAYS Performed at [facility], [address] [place], [place] [address]    Report Status PENDING  Incomplete  Culture, blood (routine x 2)     Status: None (Preliminary result)   Collection Time:  11/19/18  3:28 AM   Specimen: BLOOD  Result Value Ref Range Status   Specimen Description BLOOD RIGHT HAND  Final   Special Requests   Final    BOTTLES DRAWN AEROBIC AND ANAEROBIC Blood Culture adequate volume   Culture   Final    NO GROWTH 3 DAYS Performed at [facility], [address] [place],  [address]    Report Status PENDING  Incomplete  SARS Coronavirus 2 [facility] order, Performed in [place] hospital lab) Nasopharyngeal Nasopharyngeal Swab     Status: None   Collection Time: 11/19/18  3:28 AM   Specimen: Nasopharyngeal Swab  Result Value Ref Range Status   SARS Coronavirus 2 NEGATIVE NEGATIVE Final    Comment: (NOTE) If result is NEGATIVE SARS-CoV-2 target nucleic acids are NOT DETECTED. The SARS-CoV-2 RNA is generally detectable in upper and lower  respiratory specimens during the acute phase of infection. The lowest  concentration of SARS-CoV-2 viral copies this assay can detect is 250  copies / mL. A negative result does not preclude SARS-CoV-2 infection  and should not be used as the sole basis for treatment or other  patient management decisions.  A negative result may occur with  improper specimen collection / handling, submission of specimen other  than nasopharyngeal swab, presence of viral mutation(s) within the  areas targeted by this assay, and inadequate number of viral copies  (<250 copies / mL). A negative result must be combined with clinical  observations, patient history, and epidemiological information. If result is POSITIVE SARS-CoV-2 target nucleic acids are DETECTED. The SARS-CoV-2 RNA is generally detectable in upper and lower  respiratory specimens dur ing the acute phase of infection.  Positive  results are indicative of active infection with SARS-CoV-2.  Clinical  correlation with patient history and other diagnostic information is  necessary to determine patient infection status.  Positive results do  not rule out  bacterial infection or co-infection with other viruses. If result is PRESUMPTIVE POSTIVE SARS-CoV-2 nucleic acids MAY BE PRESENT.   A presumptive positive result was obtained on the submitted specimen  and confirmed on repeat testing.  While 2019 novel coronavirus  (SARS-CoV-2) nucleic acids may be present in the  submitted sample  additional confirmatory testing may be necessary for epidemiological  and / or clinical management purposes  to differentiate between  SARS-CoV-2 and other Sarbecovirus currently known to infect humans.  If clinically indicated additional testing with an alternate test  methodology [phone]) is advised. The SARS-CoV-2 RNA is generally  detectable in upper and lower respiratory sp ecimens during the acute  phase of infection. The expected result is Negative. Fact Sheet for Patients:  StrictlyIdeas.no Fact Sheet for Healthcare Providers: BankingDealers.co.za This test is not yet approved or cleared by the [place] FDA and has been authorized for detection and/or diagnosis of SARS-CoV-2 by FDA under an Emergency Use Authorization (EUA).  This EUA will remain in effect (meaning this test can be used) for the duration of the COVID-19 declaration under Section 564(b)(1) of the Act, 21 U.S.C. section 360bbb-3(b)(1), unless the authorization is terminated or revoked sooner. Performed at [facility], [address], [place], [place] [address]   Urine culture     Status: None   Collection Time: 11/19/18  5:45 AM   Specimen: Urine, Random  Result Value Ref Range Status   Specimen Description   Final    URINE, RANDOM Performed at [facility], [address], [place], [place] [address]    Special Requests   Final    NONE Performed at [facility], [address], [place], [place] [address]    Culture   Final    NO GROWTH Performed at [facility], [place] [address]  [place], [place] [address]    Report Status 11/20/2018 FINAL  Final  Respiratory Panel by PCR     Status: None   Collection Time: 11/19/18  9:30 AM   Specimen: Nasopharyngeal Swab; Respiratory  Result Value Ref Range Status   Adenovirus NOT DETECTED NOT DETECTED Final   Coronavirus 229E NOT DETECTED NOT DETECTED Final    Comment: (NOTE) The Coronavirus on the Respiratory Panel, DOES NOT test for the novel  Coronavirus (2019 nCoV)    Coronavirus HKU1 NOT DETECTED NOT DETECTED Final   Coronavirus NL63 NOT DETECTED NOT DETECTED Final   Coronavirus OC43 NOT DETECTED NOT DETECTED Final   Metapneumovirus NOT DETECTED NOT DETECTED Final   Rhinovirus / Enterovirus NOT DETECTED NOT DETECTED Final   Influenza A NOT DETECTED NOT DETECTED Final   Influenza B NOT DETECTED NOT DETECTED Final   Parainfluenza Virus 1 NOT DETECTED NOT DETECTED Final   Parainfluenza Virus 2 NOT DETECTED NOT DETECTED Final   Parainfluenza Virus 3 NOT DETECTED NOT DETECTED Final   Parainfluenza Virus 4 NOT DETECTED NOT DETECTED Final   Respiratory Syncytial Virus NOT DETECTED NOT DETECTED Final   Bordetella pertussis NOT DETECTED NOT DETECTED Final   Chlamydophila pneumoniae NOT DETECTED NOT DETECTED Final   Mycoplasma pneumoniae NOT DETECTED NOT DETECTED Final    Comment: Performed at [facility], [place], [place], [place] [address]  MRSA PCR Screening     Status: None   Collection Time: 11/19/18  9:30 AM   Specimen: Nasal Mucosa; Nasopharyngeal  Result Value Ref Range Status   MRSA by PCR NEGATIVE NEGATIVE Final    Comment:        The GeneXpert MRSA Assay (FDA approved for NASAL specimens only), is one component of a comprehensive MRSA colonization surveillance program. It is not intended to diagnose MRSA infection nor to guide or monitor treatment for MRSA infections. Performed at [facility], [place],  [place], [place] [address]   C difficile quick scan w PCR reflex     Status:  Abnormal   Collection Time: 11/21/18  6:26 PM   Specimen: STOOL  Result Value Ref Range Status   C Diff antigen POSITIVE (A) NEGATIVE Final   C Diff toxin NEGATIVE NEGATIVE Final   C Diff interpretation Results are indeterminate. See PCR results.  Final    Comment: Performed at [facility], [address] [place], [place] [address]  C. Diff by PCR, Reflexed     Status: Abnormal   Collection Time: 11/21/18  6:26 PM  Result Value Ref Range Status   Toxigenic C. Difficile by PCR POSITIVE (A) NEGATIVE Final    Comment: Positive for toxigenic C. difficile with little to no toxin production. Only treat if clinical presentation suggests symptomatic illness. Performed at [facility], [address], [place], [place] [address]    Studies/Results: Dg Abd 1 View  Result Date: 11/22/2018 CLINICAL DATA:  Abdominal distension. EXAM: ABDOMEN - 1 VIEW COMPARISON:  May 21, 2017. FINDINGS: Dilated air-filled colon is noted. No definite small bowel dilatation is noted. No abnormal calcifications are noted. IMPRESSION: Dilated air-filled colon is noted most consistent with ileus. Electronically Signed   By: Marijo Conception M.D.   On: 11/22/2018 12:45   Medications:  I have reviewed the patient's current medications. Prior to Admission:  Medications Prior to Admission  Medication Sig Dispense Refill Last Dose  . allopurinol (ZYLOPRIM) 300 MG tablet Take 300 mg by mouth daily.   11/18/2018 at 0800  . amoxicillin-clavulanate (AUGMENTIN) 875-125 MG tablet Take 1 tablet by mouth every 12 (twelve) hours. 14 tablet 0 11/18/2018 at 2000  . cetirizine (ZYRTEC) 10 MG tablet Take 10 mg by mouth at bedtime.    11/17/2018 at 2000  . Cholecalciferol (VITAMIN D3) 5000 UNITS TABS Take 5,000 Units by mouth daily.   11/18/2018 at 0800  . clonazePAM (KLONOPIN) 0.5 MG tablet Take 0.5 mg by mouth at bedtime as needed for anxiety.    11/17/2018 at 2000  . cloNIDine (CATAPRES) 0.2 MG tablet Take 0.1 mg by  mouth 2 (two) times daily.   11/18/2018 at 2000  . DULoxetine (CYMBALTA) 30 MG capsule Take 30 mg by mouth 2 (two) times daily.    11/18/2018 at 2000  . losartan (COZAAR) 100 MG tablet Take 100 mg by mouth daily.   11/18/2018 at 0800  . metoprolol succinate (TOPROL-XL) 100 MG 24 hr tablet Take 100 mg by mouth daily.   11/18/2018 at 2000  . pantoprazole (PROTONIX) 40 MG tablet Take 40 mg by mouth daily.   11/18/2018 at 0800  . rosuvastatin (CRESTOR) 5 MG tablet Take 5 mg by mouth every other day.   Past Week at Unknown time  . spironolactone (ALDACTONE) 25 MG tablet Take 25 mg by mouth daily.   11/18/2018 at 0800  . sucralfate (CARAFATE) 1 g tablet Take 1 g by mouth 4 (four) times daily.   Past Week at prn  . SYNTHROID 100 MCG tablet Take 100 mcg by mouth daily before breakfast.    11/18/2018 at 0630  . tamsulosin (FLOMAX) 0.4 MG CAPS capsule Take 1 capsule (0.4 mg total) by mouth daily. 30 capsule 0 11/18/2018 at 1800  . metroNIDAZOLE (FLAGYL) 500 MG tablet    Not Taking at Unknown time  . sodium citrate-citric acid (ORACIT) 500-334 MG/5ML solution Take 30 mLs by mouth 2 (two) times daily after a meal. (Patient not taking: Reported  on 11/19/2018) 473 mL 2 Not Taking at Unknown time  . valACYclovir (VALTREX) 1000 MG tablet TAKE 2 TABLETS BY MOUTH FOR 1 DOSE NOW THEN REPEAT 2 TABLETS IN 12 HOURS   prn at prn   Scheduled: . allopurinol  300 mg Oral Daily  . Chlorhexidine Gluconate Cloth  6 each Topical Daily  . cloNIDine  0.1 mg Oral BID  . DULoxetine  30 mg Oral BID  . feeding supplement  1 Container Oral TID BM  . heparin  5,000 Units Subcutaneous Q8H  . levothyroxine  100 mcg Oral QAC breakfast  . losartan  100 mg Oral Daily  . metoprolol succinate  100 mg Oral Daily  . tamsulosin  0.4 mg Oral Daily  . vancomycin  500 mg Oral Q6H  . vancomycin (VANCOCIN) rectal ENEMA  500 mg Rectal Q6H   Continuous: . sodium chloride 125 mL/hr at 11/22/18 1043  . metronidazole 500 mg (11/22/18 1356)    [id number] **OR** acetaminophen, albuterol, ondansetron **OR** ondansetron (ZOFRAN) IV Anti-infectives (From admission, onward)   Start     Dose/Rate Route Frequency Ordered Stop   11/22/18 1200  vancomycin (VANCOCIN) 500 mg in sodium chloride irrigation 0.9 % 100 mL ENEMA     500 mg Rectal Every 6 hours 11/22/18 1147 12/06/18 1159   11/21/18 2000  metroNIDAZOLE (FLAGYL) IVPB 500 mg     500 mg 100 mL/hr over 60 Minutes Intravenous Every 8 hours 11/21/18 1816     11/21/18 1400  metroNIDAZOLE (FLAGYL) IVPB 500 mg  Status:  Discontinued     500 mg 100 mL/hr over 60 Minutes Intravenous Every 8 hours 11/21/18 1347 11/21/18 1618   11/21/18 1400  vancomycin (VANCOCIN) 50 mg/mL oral solution 500 mg  Status:  Discontinued     500 mg Oral Every 6 hours 11/21/18 1347 11/21/18 1357   11/21/18 1400  metroNIDAZOLE (FLAGYL) IVPB 500 mg  Status:  Discontinued     500 mg 100 mL/hr over 60 Minutes Intravenous Every 8 hours 11/21/18 1347 11/21/18 1347   11/21/18 1400  vancomycin (VANCOCIN) 50 mg/mL oral solution 500 mg     500 mg Oral Every 6 hours 11/21/18 1358 12/05/18 1159   11/20/18 2000  vancomycin (VANCOCIN) IVPB 1000 mg/200 mL premix  Status:  Discontinued     1,000 mg 200 mL/hr over 60 Minutes Intravenous Every 24 hours 11/20/18 1448 11/21/18 0746   11/20/18 1600  metroNIDAZOLE (FLAGYL) IVPB 500 mg  Status:  Discontinued     500 mg 100 mL/hr over 60 Minutes Intravenous Every 8 hours 11/20/18 1507 11/20/18 1626   11/20/18 1515  piperacillin-tazobactam (ZOSYN) IVPB 3.375 g  Status:  Discontinued     3.375 g 12.5 mL/hr over 240 Minutes Intravenous Every 8 hours 11/20/18 1507 11/21/18 1730   11/19/18 1930  vancomycin (VANCOCIN) 1,250 mg in sodium chloride 0.9 % 250 mL IVPB     1,250 mg 166.7 mL/hr over 90 Minutes Intravenous  Once 11/19/18 1901 11/19/18 2211   11/19/18 1800  vancomycin (VANCOCIN) 1,250 mg in sodium chloride 0.9 % 250 mL IVPB  Status:  Discontinued     1,250 mg 166.7 mL/hr  over 90 Minutes Intravenous  Once 11/19/18 1734 11/19/18 1901   11/19/18 1600  ceFEPIme (MAXIPIME) 2 g in sodium chloride 0.9 % 100 mL IVPB  Status:  Discontinued     2 g 200 mL/hr over 30 Minutes Intravenous Every 12 hours 11/19/18 0634 11/20/18 1507   11/19/18 1400  metroNIDAZOLE (FLAGYL) IVPB  500 mg  Status:  Discontinued     500 mg 100 mL/hr over 60 Minutes Intravenous Every 8 hours 11/19/18 0634 11/20/18 0924   11/19/18 1204  vancomycin variable dose per unstable renal function (pharmacist dosing)  Status:  Discontinued      Does not apply See admin instructions 11/19/18 1204 11/20/18 1447   11/19/18 0415  vancomycin (VANCOCIN) 2,000 mg in sodium chloride 0.9 % 500 mL IVPB     2,000 mg 250 mL/hr over 120 Minutes Intravenous  Once 11/19/18 0405 11/19/18 0733   11/19/18 0400  ceFEPIme (MAXIPIME) 2 g in sodium chloride 0.9 % 100 mL IVPB     2 g 200 mL/hr over 30 Minutes Intravenous  Once 11/19/18 0357 11/19/18 0511   11/19/18 0400  metroNIDAZOLE (FLAGYL) IVPB 500 mg     500 mg 100 mL/hr over 60 Minutes Intravenous  Once 11/19/18 0357 11/19/18 0548   11/19/18 0400  vancomycin (VANCOCIN) IVPB 1000 mg/200 mL premix  Status:  Discontinued     1,000 mg 200 mL/hr over 60 Minutes Intravenous  Once 11/19/18 0357 11/19/18 0405     Scheduled Meds: . allopurinol  300 mg Oral Daily  . Chlorhexidine Gluconate Cloth  6 each Topical Daily  . cloNIDine  0.1 mg Oral BID  . DULoxetine  30 mg Oral BID  . feeding supplement  1 Container Oral TID BM  . heparin  5,000 Units Subcutaneous Q8H  . levothyroxine  100 mcg Oral QAC breakfast  . losartan  100 mg Oral Daily  . metoprolol succinate  100 mg Oral Daily  . tamsulosin  0.4 mg Oral Daily  . vancomycin  500 mg Oral Q6H  . vancomycin (VANCOCIN) rectal ENEMA  500 mg Rectal Q6H   Continuous Infusions: . sodium chloride 125 mL/hr at 11/22/18 1043  . metronidazole 500 mg (11/22/18 1356)   PRN Meds:.acetaminophen **OR** acetaminophen, albuterol,  ondansetron **OR** ondansetron (ZOFRAN) IV   Assessment: Active Problems:   Sepsis ([place])   Pressure injury of skin  Severe C. difficile colitis with ileus X-ray KUB today revealed colonic ileus Persistent leukocytosis Severe hypoalbuminemia Patient has poor prognostic indicators such as persistent leukocytosis WBC >20K, AMS, Ileus and if he fails to respond in 3 to 5 days of maximal medical therapy oral Vanco plus IV Flagyl plus Vanco enema  Plan: Continue oral vancomycin 500 mg p.o. every 6 hours Continue IV Flagyl 500 mg every 8 hours Add vancomycin enema every 6 hours, day 1, recommend to encourage patient retain the enema as long as possible ID is on board Recommended surgery consult  Serial abdominal exams, Daily KUB Switch to clear liquid diet only Monitor electrolytes, urine output, renal function closely, maintain K > 4, Mag > 2, Phos > 4 in setting of ileus Continue IV hydration Low threshold to transfer to ICU if patient deteriorates  Dr. Vicente Males will cover tomorrow   LOS: 3 days   Vearl Allbaugh 11/22/2018, 3:52 PM

## 2018-11-22 NOTE — Progress Notes (Signed)
   Date of Admission:  11/19/2018      Subjective: Has large volume diarrhea Pain abdomen No fever Poor appetite  Medications:  . allopurinol  300 mg Oral Daily  . Chlorhexidine Gluconate Cloth  6 each Topical Daily  . cloNIDine  0.1 mg Oral BID  . DULoxetine  30 mg Oral BID  . feeding supplement  1 Container Oral TID BM  . heparin  5,000 Units Subcutaneous Q8H  . levothyroxine  100 mcg Oral QAC breakfast  . losartan  100 mg Oral Daily  . metoprolol succinate  100 mg Oral Daily  . tamsulosin  0.4 mg Oral Daily  . vancomycin  500 mg Oral Q6H  . vancomycin (VANCOCIN) rectal ENEMA  500 mg Rectal Q6H    Objective: Vital signs in last 24 hours: Temp:  [98.1 F (36.7 C)-99.2 F (37.3 C)] 98.1 F (36.7 C) (10/10 1033) Pulse Rate:  [82-103] 103 (10/10 1033) Resp:  [18-20] 18 (10/10 1033) BP: (142-146)/(63-81) 142/81 (10/10 1033) SpO2:  [93 %-96 %] 93 % (10/10 1033) Weight:  [96.8 kg] 96.8 kg (10/10 0500)  PHYSICAL EXAM:  General:More Alert, cooperative, no distress, hard of hearing Head: Normocephalic, without obvious abnormality, atraumatic. Eyes: Conjunctivae clear, anicteric sclerae. Pupils are equal ENT Nares normal. No drainage or sinus tenderness. Lips, mucosa, and tongue dry Neck: Supple, symmetrical, no adenopathy, thyroid: non tender no carotid bruit and no JVD. Lungs: b/l air entry- crepts bases Heart: tachycardia. Abdomen: Soft,  distended. Tender Extremities: atraumatic, no cyanosis. No edema. No clubbing Skin: No rashes or lesions. Or bruising Lymph: Cervical, supraclavicular normal. Neurologic: Grossly non-focal  Lab Results Recent Labs    11/20/18 0603 11/21/18 0516 11/22/18 0510  WBC 24.3* 26.8* 29.6*  HGB 11.1* 10.9* 11.0*  HCT 34.2* 32.9* 32.9*  NA 137  --  136  K 3.8  --  3.6  CL 110  --  110  CO2 23  --  21*  BUN 26*  --  25*  CREATININE 1.22 1.19 1.03   Liver Panel Recent Labs    11/22/18 0510  PROT 4.9*  ALBUMIN 1.9*  AST 23   ALT 15  ALKPHOS 71  BILITOT 0.5   Sedimentation Rate No results for input(s): ESRSEDRATE in the last 72 hours. C-Reactive Protein No results for input(s): CRP in the last 72 hours.  Microbiology:  Studies/Results: Dg Abd 1 View  Result Date: 11/22/2018 CLINICAL DATA:  Abdominal distension. EXAM: ABDOMEN - 1 VIEW COMPARISON:  May 21, 2017. FINDINGS: Dilated air-filled colon is noted. No definite small bowel dilatation is noted. No abnormal calcifications are noted. IMPRESSION: Dilated air-filled colon is noted most consistent with ileus. Electronically Signed   By: Marijo Conception M.D.   On: 11/22/2018 12:45     Assessment/Plan:  Severe cdiff colitis with colonic distension- on IV metronidazole, PO vanco and rectal vanco. Seen by surgeon. no intervention currently. Followed by GI  Sepsis- due to cdiff- no evidence of pneumonia, UTI, blood culture neg, urine culture neg  BPH_ has a foley for urine retention  AKI on CKD- resolved Discussed the management with patient and care team

## 2018-11-23 ENCOUNTER — Inpatient Hospital Stay: Payer: Medicare HMO

## 2018-11-23 LAB — CBC
HCT: 32 % — ABNORMAL LOW (ref 39.0–52.0)
Hemoglobin: 10.5 g/dL — ABNORMAL LOW (ref 13.0–17.0)
MCH: 33.8 pg (ref 26.0–34.0)
MCHC: 32.8 g/dL (ref 30.0–36.0)
MCV: 102.9 fL — ABNORMAL HIGH (ref 80.0–100.0)
Platelets: 252 10*3/uL (ref 150–400)
RBC: 3.11 MIL/uL — ABNORMAL LOW (ref 4.22–5.81)
RDW: 14 % (ref 11.5–15.5)
WBC: 23.6 10*3/uL — ABNORMAL HIGH (ref 4.0–10.5)
nRBC: 0 % (ref 0.0–0.2)

## 2018-11-23 LAB — COMPREHENSIVE METABOLIC PANEL
ALT: 18 U/L (ref 0–44)
AST: 20 U/L (ref 15–41)
Albumin: 1.9 g/dL — ABNORMAL LOW (ref 3.5–5.0)
Alkaline Phosphatase: 71 U/L (ref 38–126)
Anion gap: 6 (ref 5–15)
BUN: 29 mg/dL — ABNORMAL HIGH (ref 8–23)
CO2: 20 mmol/L — ABNORMAL LOW (ref 22–32)
Calcium: 7.7 mg/dL — ABNORMAL LOW (ref 8.9–10.3)
Chloride: 110 mmol/L (ref 98–111)
Creatinine, Ser: 0.99 mg/dL (ref 0.61–1.24)
GFR calc Af Amer: >60 mL/min (ref >60)
GFR calc non Af Amer: >60 mL/min (ref >60)
Glucose, Bld: 106 mg/dL — ABNORMAL HIGH (ref 70–99)
Potassium: 3.8 mmol/L (ref 3.5–5.1)
Sodium: 136 mmol/L (ref 135–145)
Total Bilirubin: 0.5 mg/dL (ref 0.3–1.2)
Total Protein: 4.5 g/dL — ABNORMAL LOW (ref 6.5–8.1)

## 2018-11-23 MED ORDER — SALINE SPRAY 0.65 % NA SOLN
1.0000 | NASAL | Status: DC | PRN
  Filled 2018-11-23: qty 44

## 2018-11-23 NOTE — Progress Notes (Signed)
Bobby Lane , MD [address], [place], [place], [place], [address] [address], [place], [place], [place], [address] Phone: [phone]  Fax: [phone]   Bobby Lane is being followed for C diff diarrhea    Subjective: Not much pain , looks better    Objective: Vital signs in last 24 hours: Vitals:   11/22/18 1933 11/23/18 0500 11/23/18 0520 11/23/18 0847  BP: (!) 105/53  134/63 140/68  Pulse: 66  84 91  Resp: (!) 22  20 20   Temp: 98.6 F (37 C)  98.1 F (36.7 C) 98.7 F (37.1 C)  TempSrc: Oral  Oral Oral  SpO2: 95%  96% 95%  Weight:  99 kg    Height:       Weight change: 2.22 kg  Intake/Output Summary (Last 24 hours) at 11/23/2018 0848 Last data filed at 11/22/2018 2348 Gross per 24 hour  Intake 1525.35 ml  Output 550 ml  Net 975.35 ml     Exam: Heart:: Regular rate and rhythm, S1S2 present or without murmur or extra heart sounds Lungs: normal, clear to auscultation and clear to auscultation and percussion Abdomen: mild distension but better than two days back, soft , no tenderness, BS+    Lab Results: @LABTEST2 @ Micro Results: Recent Results (from the past 240 hour(s))  Blood Culture (routine x 2)     Status: None   Collection Time: 11/13/18 11:00 PM   Specimen: BLOOD  Result Value Ref Range Status   Specimen Description BLOOD RIGHT ASSIST CONTROL  Final   Special Requests   Final    BOTTLES DRAWN AEROBIC AND ANAEROBIC Blood Culture results may not be optimal due to an excessive volume of blood received in culture bottles   Culture   Final    NO GROWTH 5 DAYS Performed at [facility], [address] [place], [place] [address]    Report Status 11/18/2018 FINAL  Final  Blood Culture (routine x 2)     Status: None   Collection Time: 11/13/18 11:00 PM   Specimen: BLOOD  Result Value Ref Range Status   Specimen Description BLOOD LEFT ANTECUBITAL  Final   Special Requests   Final    BOTTLES DRAWN AEROBIC AND ANAEROBIC Blood  Culture results may not be optimal due to an excessive volume of blood received in culture bottles   Culture   Final    NO GROWTH 5 DAYS Performed at [facility], [address] [place], [place] [address]    Report Status 11/18/2018 FINAL  Final  SARS Coronavirus 2 [facility] order, Performed in [facility] hospital lab) Nasopharyngeal Nasopharyngeal Swab     Status: None   Collection Time: 11/13/18 11:22 PM   Specimen: Nasopharyngeal Swab  Result Value Ref Range Status   SARS Coronavirus 2 NEGATIVE NEGATIVE Final    Comment: (NOTE) If result is NEGATIVE SARS-CoV-2 target nucleic acids are NOT DETECTED. The SARS-CoV-2 RNA is generally detectable in upper and lower  respiratory specimens during the acute phase of infection. The lowest  concentration of SARS-CoV-2 viral copies this assay can detect is 250  copies / mL. A negative result does not preclude SARS-CoV-2 infection  and should not be used as the sole basis for treatment or other  patient management decisions.  A negative result may occur with  improper specimen collection / handling, submission of specimen other  than nasopharyngeal swab, presence of viral mutation(s) within the  areas targeted by this assay, and inadequate number  of viral copies  (<250 copies / mL). A negative result must be combined with clinical  observations, patient history, and epidemiological information. If result is POSITIVE SARS-CoV-2 target nucleic acids are DETECTED. The SARS-CoV-2 RNA is generally detectable in upper and lower  respiratory specimens dur ing the acute phase of infection.  Positive  results are indicative of active infection with SARS-CoV-2.  Clinical  correlation with patient history and other diagnostic information is  necessary to determine patient infection status.  Positive results do  not rule out bacterial infection or co-infection with other viruses. If result is PRESUMPTIVE POSTIVE SARS-CoV-2 nucleic acids MAY  BE PRESENT.   A presumptive positive result was obtained on the submitted specimen  and confirmed on repeat testing.  While 2019 novel coronavirus  (SARS-CoV-2) nucleic acids may be present in the submitted sample  additional confirmatory testing may be necessary for epidemiological  and / or clinical management purposes  to differentiate between  SARS-CoV-2 and other Sarbecovirus currently known to infect humans.  If clinically indicated additional testing with an alternate test  methodology [phone]) is advised. The SARS-CoV-2 RNA is generally  detectable in upper and lower respiratory sp ecimens during the acute  phase of infection. The expected result is Negative. Fact Sheet for Patients:  StrictlyIdeas.no Fact Sheet for Healthcare Providers: BankingDealers.co.za This test is not yet approved or cleared by the [place] FDA and has been authorized for detection and/or diagnosis of SARS-CoV-2 by FDA under an Emergency Use Authorization (EUA).  This EUA will remain in effect (meaning this test can be used) for the duration of the COVID-19 declaration under Section 564(b)(1) of the Act, 21 U.S.C. section 360bbb-3(b)(1), unless the authorization is terminated or revoked sooner. Performed at [facility], [address] [place], [place] [address]   Urine culture     Status: Abnormal   Collection Time: 11/14/18 12:18 AM   Specimen: In/Out Cath Urine  Result Value Ref Range Status   Specimen Description   Final    IN/OUT CATH URINE Performed at [facility], [address] [place], [place] [address]    Special Requests   Final    NONE Performed at [facility], [address] [place], [place] [address]    Culture >=100,000 COLONIES/mL ENTEROCOCCUS FAECALIS (A)  Final   Report Status 11/15/2018 FINAL  Final   Organism ID, Bacteria ENTEROCOCCUS FAECALIS (A)  Final      Susceptibility   Enterococcus  faecalis - MIC*    AMPICILLIN <=2 SENSITIVE Sensitive     LEVOFLOXACIN 2 SENSITIVE Sensitive     NITROFURANTOIN <=16 SENSITIVE Sensitive     VANCOMYCIN 1 SENSITIVE Sensitive     * >=100,000 COLONIES/mL ENTEROCOCCUS FAECALIS  Culture, blood (routine x 2)     Status: None (Preliminary result)   Collection Time: 11/19/18  3:28 AM   Specimen: BLOOD  Result Value Ref Range Status   Specimen Description BLOOD LEFT FA  Final   Special Requests   Final    BOTTLES DRAWN AEROBIC AND ANAEROBIC Blood Culture results may not be optimal due to an excessive volume of blood received in culture bottles   Culture   Final    NO GROWTH 4 DAYS Performed at [facility], [address] [place], [place] [address]    Report Status PENDING  Incomplete  Culture, blood (routine x 2)     Status: None (Preliminary result)   Collection Time: 11/19/18  3:28 AM   Specimen:  BLOOD  Result Value Ref Range Status   Specimen Description BLOOD RIGHT HAND  Final   Special Requests   Final    BOTTLES DRAWN AEROBIC AND ANAEROBIC Blood Culture adequate volume   Culture   Final    NO GROWTH 4 DAYS Performed at [facility], [address], [place], [place] [address]    Report Status PENDING  Incomplete  SARS Coronavirus 2 [facility] order, Performed in [facility] hospital lab) Nasopharyngeal Nasopharyngeal Swab     Status: None   Collection Time: 11/19/18  3:28 AM   Specimen: Nasopharyngeal Swab  Result Value Ref Range Status   SARS Coronavirus 2 NEGATIVE NEGATIVE Final    Comment: (NOTE) If result is NEGATIVE SARS-CoV-2 target nucleic acids are NOT DETECTED. The SARS-CoV-2 RNA is generally detectable in upper and lower  respiratory specimens during the acute phase of infection. The lowest  concentration of SARS-CoV-2 viral copies this assay can detect is 250  copies / mL. A negative result does not preclude SARS-CoV-2 infection  and should not be used as the sole basis for treatment or other   patient management decisions.  A negative result may occur with  improper specimen collection / handling, submission of specimen other  than nasopharyngeal swab, presence of viral mutation(s) within the  areas targeted by this assay, and inadequate number of viral copies  (<250 copies / mL). A negative result must be combined with clinical  observations, patient history, and epidemiological information. If result is POSITIVE SARS-CoV-2 target nucleic acids are DETECTED. The SARS-CoV-2 RNA is generally detectable in upper and lower  respiratory specimens dur ing the acute phase of infection.  Positive  results are indicative of active infection with SARS-CoV-2.  Clinical  correlation with patient history and other diagnostic information is  necessary to determine patient infection status.  Positive results do  not rule out bacterial infection or co-infection with other viruses. If result is PRESUMPTIVE POSTIVE SARS-CoV-2 nucleic acids MAY BE PRESENT.   A presumptive positive result was obtained on the submitted specimen  and confirmed on repeat testing.  While 2019 novel coronavirus  (SARS-CoV-2) nucleic acids may be present in the submitted sample  additional confirmatory testing may be necessary for epidemiological  and / or clinical management purposes  to differentiate between  SARS-CoV-2 and other Sarbecovirus currently known to infect humans.  If clinically indicated additional testing with an alternate test  methodology [phone]) is advised. The SARS-CoV-2 RNA is generally  detectable in upper and lower respiratory sp ecimens during the acute  phase of infection. The expected result is Negative. Fact Sheet for Patients:  StrictlyIdeas.no Fact Sheet for Healthcare Providers: BankingDealers.co.za This test is not yet approved or cleared by the [place] FDA and has been authorized for detection and/or diagnosis of SARS-CoV-2  by FDA under an Emergency Use Authorization (EUA).  This EUA will remain in effect (meaning this test can be used) for the duration of the COVID-19 declaration under Section 564(b)(1) of the Act, 21 U.S.C. section 360bbb-3(b)(1), unless the authorization is terminated or revoked sooner. Performed at [facility], [address] [place], [place] [address]   Urine culture     Status: None   Collection Time: 11/19/18  5:45 AM   Specimen: Urine, Random  Result Value Ref Range Status   Specimen Description   Final    URINE, RANDOM Performed at [facility], [address], [place], [place] [address]    Special Requests  Final    NONE Performed at [facility], [address] [place], [place] [address]    Culture   Final    NO GROWTH Performed at [facility], [address] [place], [place] [address]    Report Status 11/20/2018 FINAL  Final  Respiratory Panel by PCR     Status: None   Collection Time: 11/19/18  9:30 AM   Specimen: Nasopharyngeal Swab; Respiratory  Result Value Ref Range Status   Adenovirus NOT DETECTED NOT DETECTED Final   Coronavirus 229E NOT DETECTED NOT DETECTED Final    Comment: (NOTE) The Coronavirus on the Respiratory Panel, DOES NOT test for the novel  Coronavirus (2019 nCoV)    Coronavirus HKU1 NOT DETECTED NOT DETECTED Final   Coronavirus NL63 NOT DETECTED NOT DETECTED Final   Coronavirus OC43 NOT DETECTED NOT DETECTED Final   Metapneumovirus NOT DETECTED NOT DETECTED Final   Rhinovirus / Enterovirus NOT DETECTED NOT DETECTED Final   Influenza A NOT DETECTED NOT DETECTED Final   Influenza B NOT DETECTED NOT DETECTED Final   Parainfluenza Virus 1 NOT DETECTED NOT DETECTED Final   Parainfluenza Virus 2 NOT DETECTED NOT DETECTED Final   Parainfluenza Virus 3 NOT DETECTED NOT DETECTED Final   Parainfluenza Virus 4 NOT DETECTED NOT DETECTED Final   Respiratory Syncytial Virus NOT DETECTED NOT DETECTED Final    Bordetella pertussis NOT DETECTED NOT DETECTED Final   Chlamydophila pneumoniae NOT DETECTED NOT DETECTED Final   Mycoplasma pneumoniae NOT DETECTED NOT DETECTED Final    Comment: Performed at [facility], [place]. [address] [place], [place] [address]  MRSA PCR Screening     Status: None   Collection Time: 11/19/18  9:30 AM   Specimen: Nasal Mucosa; Nasopharyngeal  Result Value Ref Range Status   MRSA by PCR NEGATIVE NEGATIVE Final    Comment:        The GeneXpert MRSA Assay (FDA approved for NASAL specimens only), is one component of a comprehensive MRSA colonization surveillance program. It is not intended to diagnose MRSA infection nor to guide or monitor treatment for MRSA infections. Performed at [facility], [address] [place], [place] [address]   C difficile quick scan w PCR reflex     Status: Abnormal   Collection Time: 11/21/18  6:26 PM   Specimen: STOOL  Result Value Ref Range Status   C Diff antigen POSITIVE (A) NEGATIVE Final   C Diff toxin NEGATIVE NEGATIVE Final   C Diff interpretation Results are indeterminate. See PCR results.  Final    Comment: Performed at [facility], [address] [place], [place] [address]  C. Diff by PCR, Reflexed     Status: Abnormal   Collection Time: 11/21/18  6:26 PM  Result Value Ref Range Status   Toxigenic C. Difficile by PCR POSITIVE (A) NEGATIVE Final    Comment: Positive for toxigenic C. difficile with little to no toxin production. Only treat if clinical presentation suggests symptomatic illness. Performed at [facility], [address], [place], [place] [address]    Studies/Results: Dg Abd 1 View  Result Date: 11/22/2018 CLINICAL DATA:  Abdominal distension. EXAM: ABDOMEN - 1 VIEW COMPARISON:  May 21, 2017. FINDINGS: Dilated air-filled colon is noted. No definite small bowel dilatation is noted. No abnormal calcifications are noted. IMPRESSION: Dilated air-filled colon is noted  most consistent with ileus. Electronically Signed   By: Marijo Conception M.D.   On: 11/22/2018 12:45  Medications: I have reviewed the patient's current medications. Scheduled Meds: . allopurinol  300 mg Oral Daily  . Chlorhexidine Gluconate Cloth  6 each Topical Daily  . cloNIDine  0.1 mg Oral BID  . DULoxetine  30 mg Oral BID  . feeding supplement  1 Container Oral TID BM  . heparin  5,000 Units Subcutaneous Q8H  . levothyroxine  100 mcg Oral QAC breakfast  . losartan  100 mg Oral Daily  . metoprolol succinate  100 mg Oral Daily  . tamsulosin  0.4 mg Oral Daily  . vancomycin  500 mg Oral Q6H  . vancomycin (VANCOCIN) rectal ENEMA  500 mg Rectal Q6H   Continuous Infusions: . sodium chloride 125 mL/hr at 11/22/18 2348  . metronidazole 500 mg (11/23/18 0514)   PRN Meds:.acetaminophen **OR** acetaminophen, albuterol, ondansetron **OR** ondansetron (ZOFRAN) IV, phenol, sodium chloride   Assessment: Active Problems:   Sepsis ([place])   Pressure injury of skin  Bobby Lane is a 78 y.o. y/o male admitted with altered mental status secondary to sepsis.  CT scan of the abdomen shows colitis.  Consulted on 11/21/2018.  At that point of time he was having some diarrhea and hence I empirically started him on treatment for severe C. difficile.  White cell count was 26,000.  C. difficile PCR positive.  With C. difficile antigen also positive.   Today white cell count has dropped down to 23,000. Still having diarrhea per patient   Plan 1.  Continue oral Vancocin vancomycin, IV Flagyl and rectal vancomycin.  Avoid any NSAIDs, narcotics if possible.  Adequate IV fluid hydration.  2.  Serial abdominal exams and if there is increased abdominal pain, tenderness or tachycardia consider imaging to rule out toxic megacolon.  Surgery is already following. Overall abdominal exam is better than day before with improvement in [facility].     LOS: 4 days   Bobby Bellows, MD 11/23/2018, 8:48 AM

## 2018-11-23 NOTE — Progress Notes (Signed)
[facility] NAME: Bobby Lane    MR#:  [id number]  DATE OF BIRTH:  02-Sep-1940  SUBJECTIVE:  Seems somewhat better than yesterday, still reports watery stools and feels tired REVIEW OF SYSTEMS:  CONSTITUTIONAL: has  fever, no fatigue or weakness.  EYES: No blurred or double vision.  EARS, NOSE, AND THROAT: No tinnitus or ear pain.  RESPIRATORY: No cough, shortness of breath, wheezing or hemoptysis.  CARDIOVASCULAR: No chest pain, orthopnea, edema.  GASTROINTESTINAL: No nausea, vomiting, diarrhea +, reporting suprapubic abdominal pain.  HEMATOLOGY: No anemia, easy bruising or bleeding SKIN: No rash or lesion. MUSCULOSKELETAL: No joint pain or arthritis.   NEUROLOGIC: No tingling, numbness, weakness.  PSYCHIATRY: No anxiety or depression. DRUG ALLERGIES:   Allergies  Allergen Reactions  . Codeine Nausea And Vomiting  . Morphine And Related Nausea And Vomiting  . Novocain [Procaine] Hives    NO TROUBLE IN OR  . Other Nausea And Vomiting  . Percocet [Oxycodone-Acetaminophen] Nausea And Vomiting  . Sulfa Antibiotics Hives  . Tramadol Nausea And Vomiting    Note: upset stomach Note: upset stomach     VITALS:  Blood pressure 140/68, pulse 91, temperature 98.7 F (37.1 C), temperature source Oral, resp. rate 20, height 5\' 7"  (1.702 m), weight 99 kg, SpO2 95 %.  PHYSICAL EXAMINATION:  GENERAL:  78 y.o.-year-old patient lying in the bed with no acute distress.  EYES: Pupils equal, round, reactive to light and accommodation. No scleral icterus. Extraocular muscles intact.  HEENT: Head atraumatic, normocephalic. Oropharynx and nasopharynx clear.  NECK:  Supple, no jugular venous distention. No thyroid enlargement, no tenderness.  LUNGS: Normal breath sounds bilaterally, no wheezing, rales,rhonchi or crepitation. No use of accessory muscles of respiration.  CARDIOVASCULAR: S1, S2 normal. No murmurs, rubs, or gallops.   ABDOMEN: Soft, diffuse generalized tenderness, more tender in the lower abdominal area is present ,no rebound tenderness  nondistended. Bowel sounds present.  EXTREMITIES: No pedal edema, cyanosis, or clubbing.  NEUROLOGIC: Cranial nerves II through XII are intact. Muscle strength weak  in all extremities. Sensation intact. Gait not checked.  PSYCHIATRIC: The patient is alert and oriented x 1-2 .  SKIN: No obvious rash, lesion, or ulcer.  LABORATORY PANEL:   CBC Recent Labs  Lab 11/23/18 0640  WBC 23.6*  HGB 10.5*  HCT 32.0*  PLT 252   ------------------------------------------------------------------------------------------------------------------  Chemistries  Recent Labs  Lab 11/22/18 1559 11/23/18 0640  NA  --  136  K  --  3.8  CL  --  110  CO2  --  20*  GLUCOSE  --  106*  BUN  --  29*  CREATININE  --  0.99  CALCIUM  --  7.7*  MG 2.0  --   AST  --  20  ALT  --  18  ALKPHOS  --  71  BILITOT  --  0.5   ------------------------------------------------------------------------------------------------------------------  Cardiac Enzymes No results for input(s): TROPONINI in the last 168 hours. ------------------------------------------------------------------------------------------------------------------  RADIOLOGY:  Dg Abd 1 View  Result Date: 11/23/2018 CLINICAL DATA:  Severe cdiff colitis with colonic distension. Eval for ileus. Pt actively having episodes of diarrhea during the exam. EXAM: ABDOMEN - 1 VIEW COMPARISON:  Abdominal radiograph 11/22/2018 FINDINGS: There are dilated loops of small and large bowel throughout the abdomen likely representing diffuse ileus. The transverse colon is significantly dilated measuring 9 cm in diameter. Health stromal markings are maintained. There is evidence of thumbprinting likely  from mucosal edema. No evidence for free air on supine view. No acute finding in the visualized skeleton. IMPRESSION: Findings consistent with  diffuse ileus. No gross free air. 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 Electronically Signed   By: Audie Pinto M.D.   On: 11/23/2018 10:15   Dg Abd 1 View  Result Date: 11/22/2018 CLINICAL DATA:  Abdominal distension. EXAM: ABDOMEN - 1 VIEW COMPARISON:  May 21, 2017. FINDINGS: Dilated air-filled colon is noted. No definite small bowel dilatation is noted. No abnormal calcifications are noted. IMPRESSION: Dilated air-filled colon is noted most consistent with ileus. Electronically Signed   By: Marijo Conception M.D.   On: 11/22/2018 12:45   EKG:   Orders placed or performed during the hospital encounter of 11/19/18  . ED EKG  . ED EKG  . EKG 12-Lead  . EKG 12-Lead  . EKG 12-Lead  . EKG 12-Lead    ASSESSMENT AND PLAN:  This is a 78 year old male admitted for sepsis.  * Sepsis: present on admission Likely due to severe C. difficile colitis-patient continues to have loose watery diarrhea CT abdomen and pelvis with colitis, nonobstructing nephrolithiasis - Continue oral Vancocin vancomycin, IV Flagyl and rectal vancomycin.  Avoid any NSAIDs, narcotics if possible. -Appreciate GI, ID, surgery input.  They are following -KUB from this morning shows signs of early toxic megacolon  * Acute on chronic kidney injury: Now resolved   Avoid nephrotoxic agents.  Hydrate with intravenous fluid.  * Hypertension: Controlled; continue losartan, metoprolol, clonidine and Spironolactone   * BPH: Continue tamsulosin.  Hypothyroidism: continue Synthroid  * Right upper extremity pain -resolved, Right wrist and forearm x-rays with no acute findings    Hyperlipidemia: Continue statin therapy   DVT prophylaxis: Heparin   GI prophylaxis: PPI per home regimen    All the records are reviewed and case discussed with Care Management/Social Workerr. Management plans discussed with the patient, nursing and they  are in agreement.  CODE STATUS:   TOTAL TIME TAKING CARE OF THIS PATIENT: 35 minutes.   POSSIBLE D/C IN 2-3 DAYS, DEPENDING ON CLINICAL CONDITION.  Note: This dictation was prepared with Dragon dictation along with smaller phrase technology. Any transcriptional errors that result from this process are unintentional.   Max Sane M.D on 11/23/2018 at 11:22 AM  Between 7am to 6pm - Pager - [phone] After 6pm go to [email or web address] - password [email or web address] [facility]  [place] [facility]  Office  [phone]  CC: Primary care physician; Danelle Berry, NP

## 2018-11-23 NOTE — Consult Note (Signed)
Reason for Consult: Clostridium difficile colitis Referring Physician: Max Sane, MD (hospitalist)  Bobby Lane is an 78 y.o. male.  HPI: He presented to the emergency room on 11/19/2018 with altered mental status.  Recent history of UTI.  He has been taking antibiotics but presented to the emergency room febrile. Underwent a CT scan of the abdomen and pelvis.  Noted to have a colon that is diffusely distended with wall thickening consistent with colitis.  Trace ascites seen in the abdomen and pelvis.  Coarse contour of the liver suggestive of cirrhosis.  He has been having diarrhea.  C. difficile was positive.  Gastroenterology and infectious disease have also been consulted.  His white blood count is up to 29,000.  Lactic acid has not been checked in 24 hours.  Procalcitonin is equivocal.  He is currently getting oral vancomycin and IV metronidazole.  His abdomen was thought to be more distended today, and therefore general surgery is consulted in this setting to evaluate for toxic megacolon.  Patient interview is compromised somewhat by his hearing loss and being a poor historian.  Much of the history is taken from the electronic medical record. He tells me that his abdomen only hurts when he has a bowel movement.  Interval History: patient states abdomen feels better today, but he continues to have diarrhea.  He complains that the room is too cold and he's worried that a medication he is being given is causing him to hallucinate. WBC is down this morning.  AXR shows colonic dilation, but no free air or pneumatosis.  Haustral markings are maintained.  Past Medical History:  Diagnosis Date  . Arthritis   . Cancer ([place])    SKIN  . COPD (chronic obstructive pulmonary disease) ([place])   . Coronary artery disease   . Dyspnea    DOE  . GERD (gastroesophageal reflux disease)   . Gout   . Heart murmur   . History of hiatal hernia   . History of kidney stones   . History of wheezing   . HOH  (hard of hearing)   . Hypertension   . Hypothyroidism   . Oxygen deficit    PRN USE  . Renal disorder   . Thyroid disease     Past Surgical History:  Procedure Laterality Date  . CATARACT EXTRACTION W/PHACO Right 08/21/2016   Procedure: CATARACT EXTRACTION PHACO AND INTRAOCULAR LENS PLACEMENT (IOC);  Surgeon: Birder Robson, MD;  Location: [facility] ORS;  Service: Ophthalmology;  Laterality: Right;  [place] 00:54.9 AP% 18.3 CDE 10.04 Fluid pack lot # [id number]  . CATARACT EXTRACTION W/PHACO Left 09/18/2016   Procedure: CATARACT EXTRACTION PHACO AND INTRAOCULAR LENS PLACEMENT (IOC);  Surgeon: Birder Robson, MD;  Location: [facility] ORS;  Service: Ophthalmology;  Laterality: Left;  [place] 00:56 AP% 19.7 CDE 11.07 Fluid pack lot # [id number]  . FRACTURE SURGERY     ANKLE  . HERNIA REPAIR    . UPPER GI ENDOSCOPY      Family History  Problem Relation Age of Onset  . Prostate cancer Neg Hx   . Bladder Cancer Neg Hx   . Kidney cancer Neg Hx     Social History:  reports that he has never smoked. He has never used smokeless tobacco. He reports that he does not drink alcohol or use drugs.  Allergies:  Allergies  Allergen Reactions  . Codeine Nausea And Vomiting  . Morphine And Related Nausea And Vomiting  . Novocain [Procaine] Hives    NO TROUBLE  IN OR  . Other Nausea And Vomiting  . Percocet [Oxycodone-Acetaminophen] Nausea And Vomiting  . Sulfa Antibiotics Hives  . Tramadol Nausea And Vomiting    Note: upset stomach Note: upset stomach     Medications:  Prior to Admission:  Medications Prior to Admission  Medication Sig Dispense Refill Last Dose  . allopurinol (ZYLOPRIM) 300 MG tablet Take 300 mg by mouth daily.   11/18/2018 at 0800  . amoxicillin-clavulanate (AUGMENTIN) 875-125 MG tablet Take 1 tablet by mouth every 12 (twelve) hours. 14 tablet 0 11/18/2018 at 2000  . cetirizine (ZYRTEC) 10 MG tablet Take 10 mg by mouth at bedtime.    11/17/2018 at 2000  . Cholecalciferol (VITAMIN D3)  5000 UNITS TABS Take 5,000 Units by mouth daily.   11/18/2018 at 0800  . clonazePAM (KLONOPIN) 0.5 MG tablet Take 0.5 mg by mouth at bedtime as needed for anxiety.    11/17/2018 at 2000  . cloNIDine (CATAPRES) 0.2 MG tablet Take 0.1 mg by mouth 2 (two) times daily.   11/18/2018 at 2000  . DULoxetine (CYMBALTA) 30 MG capsule Take 30 mg by mouth 2 (two) times daily.    11/18/2018 at 2000  . losartan (COZAAR) 100 MG tablet Take 100 mg by mouth daily.   11/18/2018 at 0800  . metoprolol succinate (TOPROL-XL) 100 MG 24 hr tablet Take 100 mg by mouth daily.   11/18/2018 at 2000  . pantoprazole (PROTONIX) 40 MG tablet Take 40 mg by mouth daily.   11/18/2018 at 0800  . rosuvastatin (CRESTOR) 5 MG tablet Take 5 mg by mouth every other day.   Past Week at Unknown time  . spironolactone (ALDACTONE) 25 MG tablet Take 25 mg by mouth daily.   11/18/2018 at 0800  . sucralfate (CARAFATE) 1 g tablet Take 1 g by mouth 4 (four) times daily.   Past Week at prn  . SYNTHROID 100 MCG tablet Take 100 mcg by mouth daily before breakfast.    11/18/2018 at 0630  . tamsulosin (FLOMAX) 0.4 MG CAPS capsule Take 1 capsule (0.4 mg total) by mouth daily. 30 capsule 0 11/18/2018 at 1800  . metroNIDAZOLE (FLAGYL) 500 MG tablet    Not Taking at Unknown time  . sodium citrate-citric acid (ORACIT) 500-334 MG/5ML solution Take 30 mLs by mouth 2 (two) times daily after a meal. (Patient not taking: Reported on 11/19/2018) 473 mL 2 Not Taking at Unknown time  . valACYclovir (VALTREX) 1000 MG tablet TAKE 2 TABLETS BY MOUTH FOR 1 DOSE NOW THEN REPEAT 2 TABLETS IN 12 HOURS   prn at prn    Results for orders placed or performed during the hospital encounter of 11/19/18 (from the past 48 hour(s))  Lactic acid, plasma     Status: None   Collection Time: 11/21/18  2:45 PM  Result Value Ref Range   Lactic Acid, Venous 1.8 0.5 - 1.9 mmol/L    Comment: Performed at [facility], [address] [place], [place] [address]  C difficile quick scan w  PCR reflex     Status: Abnormal   Collection Time: 11/21/18  6:26 PM   Specimen: STOOL  Result Value Ref Range   C Diff antigen POSITIVE (A) NEGATIVE   C Diff toxin NEGATIVE NEGATIVE   C Diff interpretation Results are indeterminate. See PCR results.     Comment: Performed at [facility], [address] [place], [place] [address]  C. Diff by PCR, Reflexed     Status: Abnormal  Collection Time: 11/21/18  6:26 PM  Result Value Ref Range   Toxigenic C. Difficile by PCR POSITIVE (A) NEGATIVE    Comment: Positive for toxigenic C. difficile with little to no toxin production. Only treat if clinical presentation suggests symptomatic illness. Performed at [facility], [address] [place], [place] [address]   CBC with Differential/Platelet     Status: Abnormal   Collection Time: 11/22/18  5:10 AM  Result Value Ref Range   WBC 29.6 (H) 4.0 - 10.5 K/uL   RBC 3.20 (L) 4.22 - 5.81 MIL/uL   Hemoglobin 11.0 (L) 13.0 - 17.0 g/dL   HCT 32.9 (L) 39.0 - 52.0 %   MCV 102.8 (H) 80.0 - 100.0 fL   MCH 34.4 (H) 26.0 - 34.0 pg   MCHC 33.4 30.0 - 36.0 g/dL   RDW 14.0 11.5 - 15.5 %   Platelets 246 150 - 400 K/uL   nRBC 0.0 0.0 - 0.2 %   Neutrophils Relative % 88 %   Neutro Abs 26.2 (H) 1.7 - 7.7 K/uL   Lymphocytes Relative 4 %   Lymphs Abs 1.1 0.7 - 4.0 K/uL   Monocytes Relative 4 %   Monocytes Absolute 1.0 0.1 - 1.0 K/uL   Eosinophils Relative 0 %   Eosinophils Absolute 0.0 0.0 - 0.5 K/uL   Basophils Relative 0 %   Basophils Absolute 0.1 0.0 - 0.1 K/uL   WBC Morphology DOHLE BODIES     Comment: TOXIC GRANULATION VACUOLATED NEUTROPHILS    RBC Morphology MORPHOLOGY UNREMARKABLE    Smear Review Normal platelet morphology    Immature Granulocytes 4 %   Abs Immature Granulocytes 1.16 (H) 0.00 - 0.07 K/uL    Comment: Performed at [facility], [address] [place], [place] [address]  Procalcitonin     Status: None   Collection Time: 11/22/18  5:10 AM  Result  Value Ref Range   Procalcitonin 0.71 ng/mL    Comment:        Interpretation: PCT > 0.5 ng/mL and <= 2 ng/mL: Systemic infection (sepsis) is possible, but other conditions are known to elevate PCT as well. (NOTE)       Sepsis PCT Algorithm           Lower Respiratory Tract                                      Infection PCT Algorithm    ----------------------------     ----------------------------         PCT < 0.25 ng/mL                PCT < 0.10 ng/mL         Strongly encourage             Strongly discourage   discontinuation of antibiotics    initiation of antibiotics    ----------------------------     -----------------------------       PCT 0.25 - 0.50 ng/mL            PCT 0.10 - 0.25 ng/mL               OR       >80% decrease in PCT            Discourage initiation of  antibiotics      Encourage discontinuation           of antibiotics    ----------------------------     -----------------------------         PCT >= 0.50 ng/mL              PCT 0.26 - 0.50 ng/mL                AND       <80% decrease in PCT             Encourage initiation of                                             antibiotics       Encourage continuation           of antibiotics    ----------------------------     -----------------------------        PCT >= 0.50 ng/mL                  PCT > 0.50 ng/mL               AND         increase in PCT                  Strongly encourage                                      initiation of antibiotics    Strongly encourage escalation           of antibiotics                                     -----------------------------                                           PCT <= 0.25 ng/mL                                                 OR                                        > 80% decrease in PCT                                     Discontinue / Do not initiate                                              antibiotics Performed at [facility], [address], [place], [place] [address]   Comprehensive metabolic panel     Status: Abnormal   Collection Time: 11/22/18  5:10 AM  Result Value Ref Range   Sodium 136 135 - 145 mmol/L   Potassium 3.6 3.5 - 5.1 mmol/L   Chloride 110 98 - 111 mmol/L   CO2 21 (L) 22 - 32 mmol/L   Glucose, Bld 118 (H) 70 - 99 mg/dL   BUN 25 (H) 8 - 23 mg/dL   Creatinine, Ser 1.03 0.61 - 1.24 mg/dL   Calcium 7.6 (L) 8.9 - 10.3 mg/dL   Total Protein 4.9 (L) 6.5 - 8.1 g/dL   Albumin 1.9 (L) 3.5 - 5.0 g/dL   AST 23 15 - 41 U/L   ALT 15 0 - 44 U/L   Alkaline Phosphatase 71 38 - 126 U/L   Total Bilirubin 0.5 0.3 - 1.2 mg/dL   GFR calc non Af Amer >60 >60 mL/min   GFR calc Af Amer >60 >60 mL/min   Anion gap 5 5 - 15    Comment: Performed at [facility], [address] [place], [place] [address]  TSH     Status: None   Collection Time: 11/22/18  5:10 AM  Result Value Ref Range   TSH 1.672 0.350 - 4.500 uIU/mL    Comment: Performed by a 3rd Generation assay with a functional sensitivity of <=0.01 uIU/mL. Performed at [facility], [address] [place], [place] [address]   Magnesium     Status: None   Collection Time: 11/22/18  3:59 PM  Result Value Ref Range   Magnesium 2.0 1.7 - 2.4 mg/dL    Comment: Performed at [facility], [address] [place], [place] [address]  Phosphorus     Status: Abnormal   Collection Time: 11/22/18  3:59 PM  Result Value Ref Range   Phosphorus 2.2 (L) 2.5 - 4.6 mg/dL    Comment: Performed at [facility], [address] [place], [place] [address]  CBC     Status: Abnormal   Collection Time: 11/23/18  6:40 AM  Result Value Ref Range   WBC 23.6 (H) 4.0 - 10.5 K/uL   RBC 3.11 (L) 4.22 - 5.81 MIL/uL   Hemoglobin 10.5 (L) 13.0 - 17.0 g/dL   HCT 32.0 (L) 39.0 - 52.0 %   MCV 102.9 (H) 80.0 - 100.0 fL   MCH 33.8 26.0 - 34.0 pg   MCHC 32.8 30.0 - 36.0 g/dL   RDW 14.0 11.5 -  15.5 %   Platelets 252 150 - 400 K/uL   nRBC 0.0 0.0 - 0.2 %    Comment: Performed at [facility], [address] [place], [place] [address]  Comprehensive metabolic panel     Status: Abnormal   Collection Time: 11/23/18  6:40 AM  Result Value Ref Range   Sodium 136 135 - 145 mmol/L   Potassium 3.8 3.5 - 5.1 mmol/L   Chloride 110 98 - 111 mmol/L   CO2 20 (L) 22 - 32 mmol/L   Glucose, Bld 106 (H) 70 - 99 mg/dL   BUN 29 (H) 8 - 23 mg/dL   Creatinine, Ser 0.99 0.61 - 1.24 mg/dL   Calcium 7.7 (L) 8.9 - 10.3 mg/dL   Total Protein 4.5 (L) 6.5 - 8.1 g/dL   Albumin 1.9 (L) 3.5 - 5.0 g/dL   AST 20 15 - 41 U/L   ALT 18 0 - 44 U/L   Alkaline Phosphatase 71 38 - 126 U/L   Total Bilirubin 0.5 0.3 - 1.2 mg/dL   GFR calc non Af Amer >60 >  60 mL/min   GFR calc Af Amer >60 >60 mL/min   Anion gap 6 5 - 15    Comment: Performed at [facility], [address] [place], [place] [address]    Dg Abd 1 View  Result Date: 11/23/2018 CLINICAL DATA:  Severe cdiff colitis with colonic distension. Eval for ileus. Pt actively having episodes of diarrhea during the exam. EXAM: ABDOMEN - 1 VIEW COMPARISON:  Abdominal radiograph 11/22/2018 FINDINGS: There are dilated loops of small and large bowel throughout the abdomen likely representing diffuse ileus. The transverse colon is significantly dilated measuring 9 cm in diameter. Health stromal markings are maintained. There is evidence of thumbprinting likely from mucosal edema. No evidence for free air on supine view. No acute finding in the visualized skeleton. IMPRESSION: Findings consistent with diffuse ileus. No gross free air. 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 Electronically Signed   By: Audie Pinto M.D.   On: 11/23/2018 10:15   Dg Abd 1 View  Result Date: 11/22/2018 CLINICAL DATA:  Abdominal distension. EXAM: ABDOMEN - 1 VIEW COMPARISON:   May 21, 2017. FINDINGS: Dilated air-filled colon is noted. No definite small bowel dilatation is noted. No abnormal calcifications are noted. IMPRESSION: Dilated air-filled colon is noted most consistent with ileus. Electronically Signed   By: Marijo Conception M.D.   On: 11/22/2018 12:45   I/O last 3 completed shifts: In: 2995.8 [I.V.:2695.8; IV Piggyback:300] Out: 1100 [Urine:1100] No intake/output data recorded.   Review of Systems  Unable to perform ROS: Mental acuity   Blood pressure 140/68, pulse 91, temperature 98.7 F (37.1 C), temperature source Oral, resp. rate 20, height 5\' 7"  (1.702 m), weight 99 kg, SpO2 95 %. Physical Exam  Constitutional: He appears well-developed and well-nourished. No distress.  HENT:  Head: Normocephalic.  Poor dentition.  Scrape or bruise on the left nasal bridge.  Eyes: Right eye exhibits no discharge. Left eye exhibits no discharge. No scleral icterus.  Neck: No tracheal deviation present.  Cardiovascular: Regular rhythm.  Respiratory: Effort normal. No stridor. No respiratory distress.  GI: Soft. He exhibits distension. There is abdominal tenderness. There is guarding. There is no rebound.  Abdomen is softer today and seems less tender on exam.  Minimal guarding  (likely voluntary) with palpation of suprapubic area.  Genitourinary:    Genitourinary Comments: Deferred.  Foley in place.   Musculoskeletal:        General: No deformity.  Neurological: He is alert.  Skin: Skin is warm and dry.  Psychiatric: He has a normal mood and affect.    Assessment/Plan: This is a 78 year old man with colonic dilation and C. difficile colitis. He appears less ill today and his abdomen is softer. Has been getting IV metronidazole and PO vancomycin and vancomycin enemas. WBC is improving. Continue close monitoring of labs.  Serial abdominal exams and daily abdominal imaging.  Consider nasoenteric decompression, though less likely to be of benefit in isolated  colonic dilation.  Will continue to follow.  Bobby Lane 11/23/2018, 2:37 PM

## 2018-11-23 NOTE — Progress Notes (Signed)
Called pt's son Roxy Manns and notified him of pt's transfer to room 124 as well as updated him on the pt's condition. Called report to Yasmin S, RN on 1C and all questions answered. Will call for transport as soon as possible.   Earleen Reaper, RN

## 2018-11-24 DIAGNOSIS — A419 Sepsis, unspecified organism: Secondary | ICD-10-CM | POA: Diagnosis not present

## 2018-11-24 DIAGNOSIS — A0472 Enterocolitis due to Clostridium difficile, not specified as recurrent: Secondary | ICD-10-CM | POA: Diagnosis not present

## 2018-11-24 DIAGNOSIS — N401 Enlarged prostate with lower urinary tract symptoms: Secondary | ICD-10-CM | POA: Diagnosis not present

## 2018-11-24 DIAGNOSIS — R338 Other retention of urine: Secondary | ICD-10-CM | POA: Diagnosis not present

## 2018-11-24 LAB — BASIC METABOLIC PANEL
Anion gap: 3 — ABNORMAL LOW (ref 5–15)
BUN: 27 mg/dL — ABNORMAL HIGH (ref 8–23)
CO2: 20 mmol/L — ABNORMAL LOW (ref 22–32)
Calcium: 7.6 mg/dL — ABNORMAL LOW (ref 8.9–10.3)
Chloride: 112 mmol/L — ABNORMAL HIGH (ref 98–111)
Creatinine, Ser: 0.76 mg/dL (ref 0.61–1.24)
GFR calc Af Amer: >60 mL/min (ref >60)
GFR calc non Af Amer: >60 mL/min (ref >60)
Glucose, Bld: 97 mg/dL (ref 70–99)
Potassium: 3.2 mmol/L — ABNORMAL LOW (ref 3.5–5.1)
Sodium: 135 mmol/L (ref 135–145)

## 2018-11-24 LAB — CULTURE, BLOOD (ROUTINE X 2)
Culture: NO GROWTH
Culture: NO GROWTH
Special Requests: ADEQUATE

## 2018-11-24 LAB — CBC
HCT: 32.9 % — ABNORMAL LOW (ref 39.0–52.0)
Hemoglobin: 10.7 g/dL — ABNORMAL LOW (ref 13.0–17.0)
MCH: 33.8 pg (ref 26.0–34.0)
MCHC: 32.5 g/dL (ref 30.0–36.0)
MCV: 103.8 fL — ABNORMAL HIGH (ref 80.0–100.0)
Platelets: 252 10*3/uL (ref 150–400)
RBC: 3.17 MIL/uL — ABNORMAL LOW (ref 4.22–5.81)
RDW: 14.3 % (ref 11.5–15.5)
WBC: 18.2 10*3/uL — ABNORMAL HIGH (ref 4.0–10.5)
nRBC: 0 % (ref 0.0–0.2)

## 2018-11-24 LAB — TSH: TSH: 1.998 u[IU]/mL (ref 0.350–4.500)

## 2018-11-24 MED ORDER — POTASSIUM CHLORIDE CRYS ER 20 MEQ PO TBCR
40.0000 meq | EXTENDED_RELEASE_TABLET | Freq: Once | ORAL | Status: AC
  Administered 2018-11-24: 40 meq via ORAL
  Filled 2018-11-24: qty 2

## 2018-11-24 NOTE — Progress Notes (Signed)
Vonda Antigua, MD [address], [place], [place], [place], [address] [address], [place], [place], [place], [address] Phone: [phone]  Fax: [phone]   Subjective: Patient reports diarrhea is better.  Abdominal pain is better.  No nausea or vomiting.   Objective: Exam: Vital signs in last 24 hours: Vitals:   11/23/18 2233 11/24/18 0559 11/24/18 1038 11/24/18 1043  BP: 129/66 120/61 (!) 112/52   Pulse: 73 70 60 64  Resp: 18  20   Temp: 98.2 F (36.8 C) 98.2 F (36.8 C) 98 F (36.7 C)   TempSrc: Oral Oral Oral   SpO2: 96% 96% 96%   Weight:      Height:       Weight change:   Intake/Output Summary (Last 24 hours) at 11/24/2018 1157 Last data filed at 11/24/2018 [id number] Gross per 24 hour  Intake 1180.18 ml  Output 1500 ml  Net -319.82 ml    General: No acute distress, AAO x3 Abd: Soft, NT/ND, No HSM Skin: Warm, no rashes Neck: Supple, Trachea midline   Lab Results: Lab Results  Component Value Date   WBC 18.2 (H) 11/24/2018   HGB 10.7 (L) 11/24/2018   HCT 32.9 (L) 11/24/2018   MCV 103.8 (H) 11/24/2018   PLT 252 11/24/2018   Micro Results: Recent Results (from the past 240 hour(s))  Culture, blood (routine x 2)     Status: None   Collection Time: 11/19/18  3:28 AM   Specimen: BLOOD  Result Value Ref Range Status   Specimen Description BLOOD LEFT FA  Final   Special Requests   Final    BOTTLES DRAWN AEROBIC AND ANAEROBIC Blood Culture results may not be optimal due to an excessive volume of blood received in culture bottles   Culture   Final    NO GROWTH 5 DAYS Performed at [facility], [address] [place], [place] [address]    Report Status 11/24/2018 FINAL  Final  Culture, blood (routine x 2)     Status: None   Collection Time: 11/19/18  3:28 AM   Specimen: BLOOD  Result Value Ref Range Status   Specimen Description BLOOD RIGHT HAND  Final   Special Requests   Final    BOTTLES DRAWN AEROBIC AND ANAEROBIC Blood  Culture adequate volume   Culture   Final    NO GROWTH 5 DAYS Performed at [facility], [address] [place], [place] [address]    Report Status 11/24/2018 FINAL  Final  SARS Coronavirus 2 [facility] order, Performed in [facility] hospital lab) Nasopharyngeal Nasopharyngeal Swab     Status: None   Collection Time: 11/19/18  3:28 AM   Specimen: Nasopharyngeal Swab  Result Value Ref Range Status   SARS Coronavirus 2 NEGATIVE NEGATIVE Final    Comment: (NOTE) If result is NEGATIVE SARS-CoV-2 target nucleic acids are NOT DETECTED. The SARS-CoV-2 RNA is generally detectable in upper and lower  respiratory specimens during the acute phase of infection. The lowest  concentration of SARS-CoV-2 viral copies this assay can detect is 250  copies / mL. A negative result does not preclude SARS-CoV-2 infection  and should not be used as the sole basis for treatment or other  patient management decisions.  A negative result may occur with  improper specimen collection / handling, submission of specimen other  than nasopharyngeal swab, presence of viral mutation(s) within the  areas targeted by this assay, and inadequate number of viral copies  (<250 copies /  mL). A negative result must be combined with clinical  observations, patient history, and epidemiological information. If result is POSITIVE SARS-CoV-2 target nucleic acids are DETECTED. The SARS-CoV-2 RNA is generally detectable in upper and lower  respiratory specimens dur ing the acute phase of infection.  Positive  results are indicative of active infection with SARS-CoV-2.  Clinical  correlation with patient history and other diagnostic information is  necessary to determine patient infection status.  Positive results do  not rule out bacterial infection or co-infection with other viruses. If result is PRESUMPTIVE POSTIVE SARS-CoV-2 nucleic acids MAY BE PRESENT.   A presumptive positive result was obtained on the submitted  specimen  and confirmed on repeat testing.  While 2019 novel coronavirus  (SARS-CoV-2) nucleic acids may be present in the submitted sample  additional confirmatory testing may be necessary for epidemiological  and / or clinical management purposes  to differentiate between  SARS-CoV-2 and other Sarbecovirus currently known to infect humans.  If clinically indicated additional testing with an alternate test  methodology [phone]) is advised. The SARS-CoV-2 RNA is generally  detectable in upper and lower respiratory sp ecimens during the acute  phase of infection. The expected result is Negative. Fact Sheet for Patients:  StrictlyIdeas.no Fact Sheet for Healthcare Providers: BankingDealers.co.za This test is not yet approved or cleared by the [place] FDA and has been authorized for detection and/or diagnosis of SARS-CoV-2 by FDA under an Emergency Use Authorization (EUA).  This EUA will remain in effect (meaning this test can be used) for the duration of the COVID-19 declaration under Section 564(b)(1) of the Act, 21 U.S.C. section 360bbb-3(b)(1), unless the authorization is terminated or revoked sooner. Performed at [facility], [address], [place], [place] [address]   Urine culture     Status: None   Collection Time: 11/19/18  5:45 AM   Specimen: Urine, Random  Result Value Ref Range Status   Specimen Description   Final    URINE, RANDOM Performed at [facility], [address], [place], [place] [address]    Special Requests   Final    NONE Performed at [facility], [address] [place], [place] [address]    Culture   Final    NO GROWTH Performed at [facility], [place] [address], [place], [place] [address]    Report Status 11/20/2018 FINAL  Final  Respiratory Panel by PCR     Status: None   Collection Time: 11/19/18  9:30 AM   Specimen: Nasopharyngeal Swab; Respiratory   Result Value Ref Range Status   Adenovirus NOT DETECTED NOT DETECTED Final   Coronavirus 229E NOT DETECTED NOT DETECTED Final    Comment: (NOTE) The Coronavirus on the Respiratory Panel, DOES NOT test for the novel  Coronavirus (2019 nCoV)    Coronavirus HKU1 NOT DETECTED NOT DETECTED Final   Coronavirus NL63 NOT DETECTED NOT DETECTED Final   Coronavirus OC43 NOT DETECTED NOT DETECTED Final   Metapneumovirus NOT DETECTED NOT DETECTED Final   Rhinovirus / Enterovirus NOT DETECTED NOT DETECTED Final   Influenza A NOT DETECTED NOT DETECTED Final   Influenza B NOT DETECTED NOT DETECTED Final   Parainfluenza Virus 1 NOT DETECTED NOT DETECTED Final   Parainfluenza Virus 2 NOT DETECTED NOT DETECTED Final   Parainfluenza Virus 3 NOT DETECTED NOT DETECTED Final   Parainfluenza Virus 4 NOT DETECTED NOT DETECTED Final   Respiratory Syncytial Virus NOT DETECTED NOT DETECTED Final   Bordetella pertussis  NOT DETECTED NOT DETECTED Final   Chlamydophila pneumoniae NOT DETECTED NOT DETECTED Final   Mycoplasma pneumoniae NOT DETECTED NOT DETECTED Final    Comment: Performed at [facility], [place] [address] [place], [place] [address]  MRSA PCR Screening     Status: None   Collection Time: 11/19/18  9:30 AM   Specimen: Nasal Mucosa; Nasopharyngeal  Result Value Ref Range Status   MRSA by PCR NEGATIVE NEGATIVE Final    Comment:        The GeneXpert MRSA Assay (FDA approved for NASAL specimens only), is one component of a comprehensive MRSA colonization surveillance program. It is not intended to diagnose MRSA infection nor to guide or monitor treatment for MRSA infections. Performed at [facility], [address] [place], [place] [address]   C difficile quick scan w PCR reflex     Status: Abnormal   Collection Time: 11/21/18  6:26 PM   Specimen: STOOL  Result Value Ref Range Status   C Diff antigen POSITIVE (A) NEGATIVE Final   C Diff toxin NEGATIVE NEGATIVE Final   C  Diff interpretation Results are indeterminate. See PCR results.  Final    Comment: Performed at [facility], [address] [place], [place] [address]  C. Diff by PCR, Reflexed     Status: Abnormal   Collection Time: 11/21/18  6:26 PM  Result Value Ref Range Status   Toxigenic C. Difficile by PCR POSITIVE (A) NEGATIVE Final    Comment: Positive for toxigenic C. difficile with little to no toxin production. Only treat if clinical presentation suggests symptomatic illness. Performed at [facility], [address] [place], [place] [address]    Studies/Results: Dg Abd 1 View  Result Date: 11/23/2018 CLINICAL DATA:  Severe cdiff colitis with colonic distension. Eval for ileus. Pt actively having episodes of diarrhea during the exam. EXAM: ABDOMEN - 1 VIEW COMPARISON:  Abdominal radiograph 11/22/2018 FINDINGS: There are dilated loops of small and large bowel throughout the abdomen likely representing diffuse ileus. The transverse colon is significantly dilated measuring 9 cm in diameter. Health stromal markings are maintained. There is evidence of thumbprinting likely from mucosal edema. No evidence for free air on supine view. No acute finding in the visualized skeleton. IMPRESSION: Findings consistent with diffuse ileus. No gross free air. 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 Electronically Signed   By: Audie Pinto M.D.   On: 11/23/2018 10:15   Dg Abd 1 View  Result Date: 11/22/2018 CLINICAL DATA:  Abdominal distension. EXAM: ABDOMEN - 1 VIEW COMPARISON:  May 21, 2017. FINDINGS: Dilated air-filled colon is noted. No definite small bowel dilatation is noted. No abnormal calcifications are noted. IMPRESSION: Dilated air-filled colon is noted most consistent with ileus. Electronically Signed   By: Marijo Conception M.D.   On: 11/22/2018 12:45   Medications:  Scheduled Meds: .  allopurinol  300 mg Oral Daily  . Chlorhexidine Gluconate Cloth  6 each Topical Daily  . cloNIDine  0.1 mg Oral BID  . DULoxetine  30 mg Oral BID  . feeding supplement  1 Container Oral TID BM  . heparin  5,000 Units Subcutaneous Q8H  . levothyroxine  100 mcg Oral QAC breakfast  . losartan  100 mg Oral Daily  . metoprolol succinate  100 mg Oral Daily  . potassium chloride  40 mEq Oral Once  . tamsulosin  0.4 mg Oral Daily  .  vancomycin  500 mg Oral Q6H  . vancomycin (VANCOCIN) rectal ENEMA  500 mg Rectal Q6H   Continuous Infusions: . sodium chloride 125 mL/hr at 11/24/18 1040  . metronidazole 500 mg (11/24/18 0344)   PRN Meds:.acetaminophen **OR** acetaminophen, albuterol, ondansetron **OR** ondansetron (ZOFRAN) IV, phenol, sodium chloride   Assessment: Active Problems:   Sepsis (HCC)   Pressure injury of skin    Plan: White count continues to decrease Continue treatment for C. difficile as ordered Continue daily abdominal exams   LOS: 5 days   Vonda Antigua, MD 11/24/2018, 11:57 AM

## 2018-11-24 NOTE — Progress Notes (Addendum)
[facility] SURGICAL PROGRESS NOTE (cpt [phone])  Hospital Day(s): 5.   Interval History: Patient seen and examined, no acute events or new complaints overnight. Patient reports persistent abdominal soreness diffusely however this is slightly improved from prior. No fever, chills, nausea, or emesis. He continues to have diarrhea. He has tolerated CLD and is reporting that he is hungry. Leukocytosis improved to 19.4K this morning. No other acute issues.   Review of Systems:  Constitutional: denies fever, chills  HEENT: denies cough or congestion  Respiratory: denies any shortness of breath  Cardiovascular: denies chest pain or palpitations  Gastrointestinal: + abdominal pain, denied N/V,  + diarrhea/and bowel function as per interval history Genitourinary: denies burning with urination or urinary frequency  Vital signs in last 24 hours: [min-max] current  Temp:  [97.8 F (36.6 C)-98.6 F (37 C)] 98 F (36.7 C) (10/12 1038) Pulse Rate:  [60-74] 64 (10/12 1043) Resp:  [18-20] 20 (10/12 1038) BP: (112-129)/(52-67) 112/52 (10/12 1038) SpO2:  [96 %-97 %] 96 % (10/12 1038)     Height: 5\' 7"  (170.2 cm) Weight: 99 kg BMI (Calculated): 34.18   Intake/Output last 2 shifts:  10/11 0701 - 10/12 0700 In: 1180.2 [I.V.:1180.2] Out: 1500 [Urine:1500]   Physical Exam:  Constitutional: alert, cooperative and no distress  HENT: normocephalic without obvious abnormality  Eyes: PERRL, EOM's grossly intact and symmetric  Respiratory: breathing non-labored at rest  Cardiovascular: regular rate and sinus rhythm  Gastrointestinal: soft, diffuse mild tenderness, improved, no rebound/no guarding, no peritonitis Musculoskeletal: no edema or wounds, motor and sensation grossly intact, NT    Labs:  CBC Latest Ref Rng & Units 11/24/2018 11/23/2018 11/22/2018  WBC 4.0 - 10.5 K/uL 18.2(H) 23.6(H) 29.6(H)  Hemoglobin 13.0 - 17.0 g/dL 10.7(L) 10.5(L) 11.0(L)  Hematocrit 39.0 - 52.0 % 32.9(L)  32.0(L) 32.9(L)  Platelets 150 - 400 K/uL 252 252 246   CMP Latest Ref Rng & Units 11/24/2018 11/23/2018 11/22/2018  Glucose 70 - 99 mg/dL 97 106(H) 118(H)  BUN 8 - 23 mg/dL 27(H) 29(H) 25(H)  Creatinine 0.61 - 1.24 mg/dL 0.76 0.99 1.03  Sodium 135 - 145 mmol/L 135 136 136  Potassium 3.5 - 5.1 mmol/L 3.2(L) 3.8 3.6  Chloride 98 - 111 mmol/L 112(H) 110 110  CO2 22 - 32 mmol/L 20(L) 20(L) 21(L)  Calcium 8.9 - 10.3 mg/dL 7.6(L) 7.7(L) 7.6(L)  Total Protein 6.5 - 8.1 g/dL - 4.5(L) 4.9(L)  Total Bilirubin 0.3 - 1.2 mg/dL - 0.5 0.5  Alkaline Phos 38 - 126 U/L - 71 71  AST 15 - 41 U/L - 20 23  ALT 0 - 44 U/L - 18 15     Imaging studies: No new pertinent imaging studies   Assessment/Plan: (ICD-10's: A15.72) 78 y.o. male with improved leukocytosis and abdominal examination with C. diff colitis without peritonitis   - Continue liquid diet + IVF  - Continue Abx regimen (IV Flagyl, PO Vancomycin, Vancomycin Enema)    - Continue serial abdominal examination +/- KUBs   - No indication for surgical intervention  - pain control prn; antiemetics prn  - monitor abdominal examination; on-going bowel function   - mobilization encouraged  - appreciate GI input  - further management per primary team   All of the above findings and recommendations were discussed with the patient, and the medical team, and all of patient's questions were answered to his expressed satisfaction.  -- Edison Simon, PA-C [facility] 11/24/2018, 10:49 AM [phone] M-F: 7am - 4pm  I saw and evaluated the patient.  I agree with the above documentation, exam, and plan, which I have edited where appropriate. Fredirick Maudlin  10:16 AM

## 2018-11-24 NOTE — Progress Notes (Signed)
Date of Admission:  11/19/2018       Subjective: Feeling better Pain abdomen better. Diarrhea better  Medications:  . allopurinol  300 mg Oral Daily  . Chlorhexidine Gluconate Cloth  6 each Topical Daily  . cloNIDine  0.1 mg Oral BID  . DULoxetine  30 mg Oral BID  . feeding supplement  1 Container Oral TID BM  . heparin  5,000 Units Subcutaneous Q8H  . levothyroxine  100 mcg Oral QAC breakfast  . losartan  100 mg Oral Daily  . metoprolol succinate  100 mg Oral Daily  . tamsulosin  0.4 mg Oral Daily  . vancomycin  500 mg Oral Q6H  . vancomycin (VANCOCIN) rectal ENEMA  500 mg Rectal Q6H    Objective: Vital signs in last 24 hours: Temp:  [97.8 F (36.6 C)-98.6 F (37 C)] 98.2 F (36.8 C) (10/12 0559) Pulse Rate:  [70-74] 70 (10/12 0559) Resp:  [18-20] 18 (10/11 2233) BP: (119-129)/(61-67) 120/61 (10/12 0559) SpO2:  [96 %-97 %] 96 % (10/12 0559)  PHYSICAL EXAM:  General: Awake, hard of hearing.  No Neck: Supple, symmetrical, no adenopathy, thyroid: non tender no carotid bruit and no JVD. Lungs: Clear to auscultation bilaterally. No Wheezing or Rhonchi. No rales. Heart: Regular rate and rhythm, no murmur, rub or gallop. Abdomen: Soft, distention improving. Bowel sounds normal. No masses Extremities: atraumatic, no cyanosis. No edema. No clubbing Skin: No rashes or lesions. Or bruising Lymph: Cervical, supraclavicular normal. Neurologic: Grossly non-focal  Lab Results Recent Labs    11/23/18 0640 11/24/18 0533  WBC 23.6* 18.2*  HGB 10.5* 10.7*  HCT 32.0* 32.9*  NA 136 135  K 3.8 3.2*  CL 110 112*  CO2 20* 20*  BUN 29* 27*  CREATININE 0.99 0.76   Liver Panel Recent Labs    11/22/18 0510 11/23/18 0640  PROT 4.9* 4.5*  ALBUMIN 1.9* 1.9*  AST 23 20  ALT 15 18  ALKPHOS 71 71  BILITOT 0.5 0.5   Sedimentation Rate No results for input(s): ESRSEDRATE in the last 72 hours. C-Reactive Protein No results for input(s): CRP in the last 72  hours.  Microbiology:  Studies/Results: Dg Abd 1 View  Result Date: 11/23/2018 CLINICAL DATA:  Severe cdiff colitis with colonic distension. Eval for ileus. Pt actively having episodes of diarrhea during the exam. EXAM: ABDOMEN - 1 VIEW COMPARISON:  Abdominal radiograph 11/22/2018 FINDINGS: There are dilated loops of small and large bowel throughout the abdomen likely representing diffuse ileus. The transverse colon is significantly dilated measuring 9 cm in diameter. Health stromal markings are maintained. There is evidence of thumbprinting likely from mucosal edema. No evidence for free air on supine view. No acute finding in the visualized skeleton. IMPRESSION: Findings consistent with diffuse ileus. No gross free air. 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 Electronically Signed   By: Audie Pinto M.D.   On: 11/23/2018 10:15   Dg Abd 1 View  Result Date: 11/22/2018 CLINICAL DATA:  Abdominal distension. EXAM: ABDOMEN - 1 VIEW COMPARISON:  May 21, 2017. FINDINGS: Dilated air-filled colon is noted. No definite small bowel dilatation is noted. No abnormal calcifications are noted. IMPRESSION: Dilated air-filled colon is noted most consistent with ileus. Electronically Signed   By: Marijo Conception M.D.   On: 11/22/2018 12:45     Assessment/Plan: Severe cdiff colitis with colonic distension- on IV metronidazole, PO vanco and rectal vanco.  Improving Seen by  Psychologist, sport and exercise. no intervention currently. Followed by GI  Sepsis- due to cdiff- no evidence of pneumonia, UTI, blood culture neg, urine culture neg  BPH_ has a foley for urine retention  AKI on CKD- resolved  Discussed the management with the patient.

## 2018-11-24 NOTE — Care Management Important Message (Signed)
Important Message  Patient Details  Name: Bobby Lane MRN: [id number] Date of Birth: 02-Jun-1940   Medicare Important Message Given:  Yes     Candie Chroman, LCSW 11/24/2018, 11:34 AM

## 2018-11-24 NOTE — Progress Notes (Addendum)
[facility] [place] NAME: Bobby Lane    MR#:  [id number]  DATE OF BIRTH:  [birth year]  SUBJECTIVE:   watery stools, he is tired, hungry REVIEW OF SYSTEMS:  CONSTITUTIONAL: has  fever, no fatigue or weakness.  EYES: No blurred or double vision.  EARS, NOSE, AND THROAT: No tinnitus or ear pain.  RESPIRATORY: No cough, shortness of breath, wheezing or hemoptysis.  CARDIOVASCULAR: No chest pain, orthopnea, edema.  GASTROINTESTINAL: No nausea, vomiting, diarrhea +, reporting suprapubic abdominal pain.  HEMATOLOGY: No anemia, easy bruising or bleeding SKIN: No rash or lesion. MUSCULOSKELETAL: No joint pain or arthritis.   NEUROLOGIC: No tingling, numbness, weakness.  PSYCHIATRY: No anxiety or depression. DRUG ALLERGIES:   Allergies  Allergen Reactions  . Codeine Nausea And Vomiting  . Morphine And Related Nausea And Vomiting  . Novocain [Procaine] Hives    NO TROUBLE IN OR  . Other Nausea And Vomiting  . Percocet [Oxycodone-Acetaminophen] Nausea And Vomiting  . Sulfa Antibiotics Hives  . Tramadol Nausea And Vomiting    Note: upset stomach Note: upset stomach     VITALS:  Blood pressure (!) 112/52, pulse 64, temperature 98 F (36.7 C), temperature source Oral, resp. rate 20, height 5\' 7"  (1.702 m), weight 99 kg, SpO2 96 %.  PHYSICAL EXAMINATION:  GENERAL:  78 y.o.-year-old patient lying in the bed with no acute distress.  EYES: Pupils equal, round, reactive to light and accommodation. No scleral icterus. Extraocular muscles intact.  HEENT: Head atraumatic, normocephalic. Oropharynx and nasopharynx clear.  NECK:  Supple, no jugular venous distention. No thyroid enlargement, no tenderness.  LUNGS: Normal breath sounds bilaterally, no wheezing, rales,rhonchi or crepitation. No use of accessory muscles of respiration.  CARDIOVASCULAR: S1, S2 normal. No murmurs, rubs, or gallops.  ABDOMEN: Soft, diffuse generalized tenderness,  less tender in the lower abdominal area,no rebound tenderness  nondistended. Bowel sounds present.  EXTREMITIES: No pedal edema, cyanosis, or clubbing.  NEUROLOGIC: Cranial nerves II through XII are intact. Muscle strength weak  in all extremities. Sensation intact. Gait not checked.  PSYCHIATRIC: The patient is alert and oriented x 1-2 .  SKIN: No obvious rash, lesion, or ulcer.  LABORATORY PANEL:   CBC Recent Labs  Lab 11/24/18 0533  WBC 18.2*  HGB 10.7*  HCT 32.9*  PLT 252   ------------------------------------------------------------------------------------------------------------------  Chemistries  Recent Labs  Lab 11/22/18 1559 11/23/18 0640 11/24/18 0533  NA  --  136 135  K  --  3.8 3.2*  CL  --  110 112*  CO2  --  20* 20*  GLUCOSE  --  106* 97  BUN  --  29* 27*  CREATININE  --  0.99 0.76  CALCIUM  --  7.7* 7.6*  MG 2.0  --   --   AST  --  20  --   ALT  --  18  --   ALKPHOS  --  71  --   BILITOT  --  0.5  --    ------------------------------------------------------------------------------------------------------------------  Cardiac Enzymes No results for input(s): TROPONINI in the last 168 hours. ------------------------------------------------------------------------------------------------------------------  RADIOLOGY:  Dg Abd 1 View  Result Date: 11/23/2018 CLINICAL DATA:  Severe cdiff colitis with colonic distension. Eval for ileus. Pt actively having episodes of diarrhea during the exam. EXAM: ABDOMEN - 1 VIEW COMPARISON:  Abdominal radiograph 11/22/2018 FINDINGS: There are dilated loops of small and large bowel throughout the abdomen likely representing diffuse ileus. The transverse colon is  significantly dilated measuring 9 cm in diameter. Health stromal markings are maintained. There is evidence of thumbprinting likely from mucosal edema. No evidence for free air on supine view. No acute finding in the visualized skeleton. IMPRESSION: Findings  consistent with diffuse ileus. No gross free air. 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 Electronically Signed   By: Audie Pinto M.D.   On: 11/23/2018 10:15   Dg Abd 1 View  Result Date: 11/22/2018 CLINICAL DATA:  Abdominal distension. EXAM: ABDOMEN - 1 VIEW COMPARISON:  May 21, 2017. FINDINGS: Dilated air-filled colon is noted. No definite small bowel dilatation is noted. No abnormal calcifications are noted. IMPRESSION: Dilated air-filled colon is noted most consistent with ileus. Electronically Signed   By: Marijo Conception M.D.   On: 11/22/2018 12:45   EKG:   Orders placed or performed during the hospital encounter of 11/19/18  . ED EKG  . ED EKG  . EKG 12-Lead  . EKG 12-Lead  . EKG 12-Lead  . EKG 12-Lead    ASSESSMENT AND PLAN:  This is a 78 year old male admitted for sepsis.  * Sepsis: present on admission Likely due to severe C. difficile colitis-patient continues to have loose watery diarrhea CT abdomen and pelvis with colitis, nonobstructing nephrolithiasis - Continue oral Vancocin vancomycin, IV Flagyl and rectal vancomycin.  Avoid any NSAIDs, narcotics if possible. -Appreciate GI, ID, surgery input.  They are following -KUB from yesterday shows signs of early toxic megacolon, his leukocytosis is improving and I think his abdomen is getting less distended than before  * Acute on chronic kidney injury 2: ARF Now resolved, renal function back to baseline  * Hypertension: Controlled; continue losartan, metoprolol, clonidine and Spironolactone   * BPH: Continue tamsulosin.   * Hypothyroidism: continue Synthroid, check TSH  * Right upper extremity pain -resolved, Right wrist and forearm x-rays with no acute findings    Hyperlipidemia: Continue statin therapy   DVT prophylaxis: Heparin   GI prophylaxis: PPI per home regimen    All the records are reviewed and case discussed  with Care Management/Social Workerr. Management plans discussed with the patient, nursing, patient's son over phone and they are in agreement.  CODE STATUS:   TOTAL TIME TAKING CARE OF THIS PATIENT: 35 minutes.   POSSIBLE D/C IN 2-3 DAYS, DEPENDING ON CLINICAL CONDITION.  Note: This dictation was prepared with Dragon dictation along with smaller phrase technology. Any transcriptional errors that result from this process are unintentional.   Max Sane M.D on 11/24/2018 at 11:33 AM  Between 7am to 6pm - Pager - [phone] After 6pm go to [email or web address] - password [email or web address] [facility]  [facility]  Office  [phone]  CC: Primary care physician; Danelle Berry, NP

## 2018-11-25 DIAGNOSIS — Z89012 Acquired absence of left thumb: Secondary | ICD-10-CM

## 2018-11-25 LAB — GI PATHOGEN PANEL BY PCR, STOOL

## 2018-11-25 LAB — CBC
HCT: 31.3 % — ABNORMAL LOW (ref 39.0–52.0)
Hemoglobin: 10.2 g/dL — ABNORMAL LOW (ref 13.0–17.0)
MCH: 34.1 pg — ABNORMAL HIGH (ref 26.0–34.0)
MCHC: 32.6 g/dL (ref 30.0–36.0)
MCV: 104.7 fL — ABNORMAL HIGH (ref 80.0–100.0)
Platelets: 236 10*3/uL (ref 150–400)
RBC: 2.99 MIL/uL — ABNORMAL LOW (ref 4.22–5.81)
RDW: 14.4 % (ref 11.5–15.5)
WBC: 13.2 10*3/uL — ABNORMAL HIGH (ref 4.0–10.5)
nRBC: 0 % (ref 0.0–0.2)

## 2018-11-25 LAB — MAGNESIUM: Magnesium: 2.1 mg/dL (ref 1.7–2.4)

## 2018-11-25 LAB — BASIC METABOLIC PANEL
Anion gap: 7 (ref 5–15)
BUN: 24 mg/dL — ABNORMAL HIGH (ref 8–23)
CO2: 19 mmol/L — ABNORMAL LOW (ref 22–32)
Calcium: 7.6 mg/dL — ABNORMAL LOW (ref 8.9–10.3)
Chloride: 110 mmol/L (ref 98–111)
Creatinine, Ser: 0.86 mg/dL (ref 0.61–1.24)
GFR calc Af Amer: >60 mL/min (ref >60)
GFR calc non Af Amer: >60 mL/min (ref >60)
Glucose, Bld: 89 mg/dL (ref 70–99)
Potassium: 3.3 mmol/L — ABNORMAL LOW (ref 3.5–5.1)
Sodium: 136 mmol/L (ref 135–145)

## 2018-11-25 MED ORDER — SPIRONOLACTONE 25 MG PO TABS
25.0000 mg | ORAL_TABLET | Freq: Every day | ORAL | Status: DC
  Administered 2018-11-25 – 2018-12-04 (×10): 25 mg via ORAL
  Filled 2018-11-25 (×10): qty 1

## 2018-11-25 MED ORDER — POTASSIUM CHLORIDE CRYS ER 20 MEQ PO TBCR
40.0000 meq | EXTENDED_RELEASE_TABLET | Freq: Once | ORAL | Status: AC
  Administered 2018-11-25: 12:00:00 40 meq via ORAL
  Filled 2018-11-25: qty 2

## 2018-11-25 NOTE — Progress Notes (Signed)
PT Cancellation Note  Patient Details Name: Bobby Lane MRN: [id number] DOB: 10-07-1940   Cancelled Treatment:    Reason Eval/Treat Not Completed: Other (comment)(PT entered room to initiate evaluation, woke patient. Pt started to report BM, uncontrollable diarrhea noted. RN and tech notified and on their way to assist patient. PT to follow up when patient is more appropriate for exertional activity.)  Lieutenant Diego PT, DPT [phone] AM,11/25/18 [phone]

## 2018-11-25 NOTE — Progress Notes (Signed)
   Date of Admission:  11/19/2018     Subjective: Says he is having a lot of diarrhea. No abdominal pain As per the nurse after rectal enema he has more diarrhea.  Medications:  . allopurinol  300 mg Oral Daily  . Chlorhexidine Gluconate Cloth  6 each Topical Daily  . cloNIDine  0.1 mg Oral BID  . DULoxetine  30 mg Oral BID  . feeding supplement  1 Container Oral TID BM  . heparin  5,000 Units Subcutaneous Q8H  . levothyroxine  100 mcg Oral QAC breakfast  . losartan  100 mg Oral Daily  . metoprolol succinate  100 mg Oral Daily  . tamsulosin  0.4 mg Oral Daily  . vancomycin  500 mg Oral Q6H  . vancomycin (VANCOCIN) rectal ENEMA  500 mg Rectal Q6H    Objective: Vital signs in last 24 hours: Temp:  [97.8 F (36.6 C)-98.2 F (36.8 C)] 97.8 F (36.6 C) (10/13 0857) Pulse Rate:  [72-73] 72 (10/13 0857) Resp:  [18] 18 (10/13 0857) BP: (118-123)/(57-65) 118/57 (10/13 0857) SpO2:  [93 %-98 %] 95 % (10/13 0857)  PHYSICAL EXAM:  General: Alert, cooperative, no distress, appears stated age.  Hard of hearing Lungs: Bilateral air entry Heart: Regular rate and rhythm, no murmur, rub or gallop. Abdomen: Soft, minimally distended   Bowel sounds present No tenderness Extremities: Has edema of his hands.  Left thumb partial amputation Edema ankles  Skin: No rashes or lesions. Or bruising Lymph: Cervical, supraclavicular normal. Neurologic: Grossly non-focal  Lab Results Recent Labs    11/24/18 0533 11/25/18 0645  WBC 18.2* 13.2*  HGB 10.7* 10.2*  HCT 32.9* 31.3*  NA 135 136  K 3.2* 3.3*  CL 112* 110  CO2 20* 19*  BUN 27* 24*  CREATININE 0.76 0.86   Liver Panel Recent Labs    11/23/18 0640  PROT 4.5*  ALBUMIN 1.9*  AST 20  ALT 18  ALKPHOS 71  BILITOT 0.5   Sedimentation Rate No results for input(s): ESRSEDRATE in the last 72 hours. C-Reactive Protein No results for input(s): CRP in the last 72 hours.  Microbiology:  Studies/Results: No results  found.   Assessment/Plan:  C. difficile colitis severe with colonic distention.  On IV metronidazole, p.o. vancomycin and rectal vancomycin    Improving.  WBC count is reduced to 13. We will stop the Vanco enema   Sepsis due to C. difficile.  No evidence of pneumonia or UTI blood cultures negative and urine culture is negative as well.  BPH is a Foley catheter for urinary retention.  He needs to follow-up with urology.    AKI on CKD is resolved  Discussed the management with the patient and his nurse.

## 2018-11-25 NOTE — Progress Notes (Signed)
OT Cancellation Note  Patient Details Name: Bobby Lane MRN: [id number] DOB: July 24, 1940   Cancelled Treatment:    Reason Eval/Treat Not Completed: Medical issues which prohibited therapy. Consult received, chart reviewed. Spoke with PT. Per PT, pt noted with uncontrollable diarrhea. RN and tech notified. OT to follow up when patient is more appropriate for exertional activity and ADL assessment.   Jeni Salles, MPH, MS, OTR/L ascom [phone] 11/25/18, 11:10 AM

## 2018-11-25 NOTE — Progress Notes (Signed)
Bobby Antigua, MD [address] [place], [place], [place], [address] [address], [place], [place], [place], [address] Phone: [phone]  Fax: [phone]   Subjective: Diarrhea continues to improve.  No abdominal pain.  Tolerating oral diet.   Objective: Exam: Vital signs in last 24 hours: Vitals:   11/24/18 1954 11/25/18 0420 11/25/18 0857 11/25/18 1449  BP: 118/65 123/62 (!) 118/57 106/65  Pulse: 73 72 72 67  Resp:   18 18  Temp: 98.2 F (36.8 C) 98.2 F (36.8 C) 97.8 F (36.6 C) 97.6 F (36.4 C)  TempSrc: Oral Oral Oral Oral  SpO2: 93% 98% 95% 97%  Weight:      Height:       Weight change:   Intake/Output Summary (Last 24 hours) at 11/25/2018 1624 Last data filed at 11/25/2018 1300 Gross per 24 hour  Intake 3136.65 ml  Output 1050 ml  Net 2086.65 ml    General: No acute distress, AAO x3 Abd: Soft, NT/ND, No HSM Skin: Warm, no rashes Neck: Supple, Trachea midline   Lab Results: Lab Results  Component Value Date   WBC 13.2 (H) 11/25/2018   HGB 10.2 (L) 11/25/2018   HCT 31.3 (L) 11/25/2018   MCV 104.7 (H) 11/25/2018   PLT 236 11/25/2018   Micro Results: Recent Results (from the past 240 hour(s))  Culture, blood (routine x 2)     Status: None   Collection Time: 11/19/18  3:28 AM   Specimen: BLOOD  Result Value Ref Range Status   Specimen Description BLOOD LEFT FA  Final   Special Requests   Final    BOTTLES DRAWN AEROBIC AND ANAEROBIC Blood Culture results may not be optimal due to an excessive volume of blood received in culture bottles   Culture   Final    NO GROWTH 5 DAYS Performed at [facility], [address] [place], [place] [address]    Report Status 11/24/2018 FINAL  Final  Culture, blood (routine x 2)     Status: None   Collection Time: 11/19/18  3:28 AM   Specimen: BLOOD  Result Value Ref Range Status   Specimen Description BLOOD RIGHT HAND  Final   Special Requests   Final    BOTTLES DRAWN AEROBIC AND  ANAEROBIC Blood Culture adequate volume   Culture   Final    NO GROWTH 5 DAYS Performed at [facility], [address], [place], [place] [address]    Report Status 11/24/2018 FINAL  Final  SARS Coronavirus 2 [facility] order, Performed in [facility] hospital lab) Nasopharyngeal Nasopharyngeal Swab     Status: None   Collection Time: 11/19/18  3:28 AM   Specimen: Nasopharyngeal Swab  Result Value Ref Range Status   SARS Coronavirus 2 NEGATIVE NEGATIVE Final    Comment: (NOTE) If result is NEGATIVE SARS-CoV-2 target nucleic acids are NOT DETECTED. The SARS-CoV-2 RNA is generally detectable in upper and lower  respiratory specimens during the acute phase of infection. The lowest  concentration of SARS-CoV-2 viral copies this assay can detect is 250  copies / mL. A negative result does not preclude SARS-CoV-2 infection  and should not be used as the sole basis for treatment or other  patient management decisions.  A negative result may occur with  improper specimen collection / handling, submission of specimen other  than nasopharyngeal swab, presence of viral mutation(s) within the  areas targeted by this assay, and inadequate number of viral copies  (<250 copies /  mL). A negative result must be combined with clinical  observations, patient history, and epidemiological information. If result is POSITIVE SARS-CoV-2 target nucleic acids are DETECTED. The SARS-CoV-2 RNA is generally detectable in upper and lower  respiratory specimens dur ing the acute phase of infection.  Positive  results are indicative of active infection with SARS-CoV-2.  Clinical  correlation with patient history and other diagnostic information is  necessary to determine patient infection status.  Positive results do  not rule out bacterial infection or co-infection with other viruses. If result is PRESUMPTIVE POSTIVE SARS-CoV-2 nucleic acids MAY BE PRESENT.   A presumptive positive result was obtained  on the submitted specimen  and confirmed on repeat testing.  While 2019 novel coronavirus  (SARS-CoV-2) nucleic acids may be present in the submitted sample  additional confirmatory testing may be necessary for epidemiological  and / or clinical management purposes  to differentiate between  SARS-CoV-2 and other Sarbecovirus currently known to infect humans.  If clinically indicated additional testing with an alternate test  methodology [phone]) is advised. The SARS-CoV-2 RNA is generally  detectable in upper and lower respiratory sp ecimens during the acute  phase of infection. The expected result is Negative. Fact Sheet for Patients:  StrictlyIdeas.no Fact Sheet for Healthcare Providers: BankingDealers.co.za This test is not yet approved or cleared by the [place] FDA and has been authorized for detection and/or diagnosis of SARS-CoV-2 by FDA under an Emergency Use Authorization (EUA).  This EUA will remain in effect (meaning this test can be used) for the duration of the COVID-19 declaration under Section 564(b)(1) of the Act, 21 U.S.C. section 360bbb-3(b)(1), unless the authorization is terminated or revoked sooner. Performed at [facility], [address] [place], [place] [address]   Urine culture     Status: None   Collection Time: 11/19/18  5:45 AM   Specimen: Urine, Random  Result Value Ref Range Status   Specimen Description   Final    URINE, RANDOM Performed at [facility], [address], [place], [place] [address]    Special Requests   Final    NONE Performed at [facility], [address], [place], [place] [address]    Culture   Final    NO GROWTH Performed at [facility], [place] [address], [place], [place] [address]    Report Status 11/20/2018 FINAL  Final  Respiratory Panel by PCR     Status: None   Collection Time: 11/19/18  9:30 AM   Specimen: Nasopharyngeal Swab;  Respiratory  Result Value Ref Range Status   Adenovirus NOT DETECTED NOT DETECTED Final   Coronavirus 229E NOT DETECTED NOT DETECTED Final    Comment: (NOTE) The Coronavirus on the Respiratory Panel, DOES NOT test for the novel  Coronavirus (2019 nCoV)    Coronavirus HKU1 NOT DETECTED NOT DETECTED Final   Coronavirus NL63 NOT DETECTED NOT DETECTED Final   Coronavirus OC43 NOT DETECTED NOT DETECTED Final   Metapneumovirus NOT DETECTED NOT DETECTED Final   Rhinovirus / Enterovirus NOT DETECTED NOT DETECTED Final   Influenza A NOT DETECTED NOT DETECTED Final   Influenza B NOT DETECTED NOT DETECTED Final   Parainfluenza Virus 1 NOT DETECTED NOT DETECTED Final   Parainfluenza Virus 2 NOT DETECTED NOT DETECTED Final   Parainfluenza Virus 3 NOT DETECTED NOT DETECTED Final   Parainfluenza Virus 4 NOT DETECTED NOT DETECTED Final   Respiratory Syncytial Virus NOT DETECTED NOT DETECTED Final   Bordetella pertussis  NOT DETECTED NOT DETECTED Final   Chlamydophila pneumoniae NOT DETECTED NOT DETECTED Final   Mycoplasma pneumoniae NOT DETECTED NOT DETECTED Final    Comment: Performed at [facility], [place] [address], [place], [place] [address]  MRSA PCR Screening     Status: None   Collection Time: 11/19/18  9:30 AM   Specimen: Nasal Mucosa; Nasopharyngeal  Result Value Ref Range Status   MRSA by PCR NEGATIVE NEGATIVE Final    Comment:        The GeneXpert MRSA Assay (FDA approved for NASAL specimens only), is one component of a comprehensive MRSA colonization surveillance program. It is not intended to diagnose MRSA infection nor to guide or monitor treatment for MRSA infections. Performed at [facility], [address] [place], [place] [address]   C difficile quick scan w PCR reflex     Status: Abnormal   Collection Time: 11/21/18  6:26 PM   Specimen: STOOL  Result Value Ref Range Status   C Diff antigen POSITIVE (A) NEGATIVE Final   C Diff toxin NEGATIVE NEGATIVE  Final   C Diff interpretation Results are indeterminate. See PCR results.  Final    Comment: Performed at [facility], [address] [place], [place] [address]  C. Diff by PCR, Reflexed     Status: Abnormal   Collection Time: 11/21/18  6:26 PM  Result Value Ref Range Status   Toxigenic C. Difficile by PCR POSITIVE (A) NEGATIVE Final    Comment: Positive for toxigenic C. difficile with little to no toxin production. Only treat if clinical presentation suggests symptomatic illness. Performed at [facility], [address] [place], [place] [address]   GI pathogen panel by PCR, stool     Status: None   Collection Time: 11/21/18  9:46 PM   Specimen: Stool  Result Value Ref Range Status   Plesiomonas shigelloides NOT DETECTED NOT DETECTED Final   Yersinia enterocolitica NOT DETECTED NOT DETECTED Final   Vibrio NOT DETECTED NOT DETECTED Final   Enteropathogenic E coli NOT DETECTED NOT DETECTED Final   E coli (ETEC) LT/ST NOT DETECTED NOT DETECTED Final   E coli [id number] by PCR Not applicable NOT DETECTED Final   Cryptosporidium by PCR NOT DETECTED NOT DETECTED Final   Entamoeba histolytica NOT DETECTED NOT DETECTED Final   Adenovirus F 40/41 NOT DETECTED NOT DETECTED Final   Norovirus GI/GII NOT DETECTED NOT DETECTED Final   Sapovirus NOT DETECTED NOT DETECTED Final    Comment: (NOTE) Performed At: [facility] [place], [place] [id number] Rush Farmer MD [id number]    Vibrio cholerae NOT DETECTED NOT DETECTED Final   Campylobacter by PCR NOT DETECTED NOT DETECTED Final   Salmonella by PCR NOT DETECTED NOT DETECTED Final   E coli (STEC) NOT DETECTED NOT DETECTED Final   Enteroaggregative E coli NOT DETECTED NOT DETECTED Final   Shigella by PCR NOT DETECTED NOT DETECTED Final   Cyclospora cayetanensis NOT DETECTED NOT DETECTED Final   Astrovirus NOT DETECTED NOT DETECTED Final   G lamblia by PCR NOT DETECTED NOT DETECTED Final   Rotavirus  A by PCR NOT DETECTED NOT DETECTED Final   Studies/Results: No results found. Medications:  Scheduled Meds: . allopurinol  300 mg Oral Daily  . Chlorhexidine Gluconate Cloth  6 each Topical Daily  . cloNIDine  0.1 mg Oral BID  . DULoxetine  30 mg Oral BID  . feeding supplement  1 Container Oral TID BM  .  heparin  5,000 Units Subcutaneous Q8H  . levothyroxine  100 mcg Oral QAC breakfast  . losartan  100 mg Oral Daily  . metoprolol succinate  100 mg Oral Daily  . spironolactone  25 mg Oral Daily  . tamsulosin  0.4 mg Oral Daily  . vancomycin  500 mg Oral Q6H   Continuous Infusions: . sodium chloride 75 mL/hr at 11/25/18 1451  . metronidazole Stopped (11/25/18 1257)   PRN Meds:.acetaminophen **OR** acetaminophen, albuterol, ondansetron **OR** ondansetron (ZOFRAN) IV, phenol, sodium chloride   Assessment: C. difficile colitis   Plan: Diarrhea improving Leukocytosis improving Continue medications as ordered GI service will sign off, please page with any questions or concerns No alarming symptoms at this time Follow-up in GI clinic in 2 to 4 weeks   LOS: 6 days   Bobby Antigua, MD 11/25/2018, 4:25 PM

## 2018-11-25 NOTE — Progress Notes (Signed)
[facility] NAME: Bobby Lane    MR#:  [id number]  DATE OF BIRTH:  1941/01/06  SUBJECTIVE:  Overall continues to improve, reducing leukocytosis REVIEW OF SYSTEMS:  CONSTITUTIONAL: has  fever, no fatigue or weakness.  EYES: No blurred or double vision.  EARS, NOSE, AND THROAT: No tinnitus or ear pain.  RESPIRATORY: No cough, shortness of breath, wheezing or hemoptysis.  CARDIOVASCULAR: No chest pain, orthopnea, edema.  GASTROINTESTINAL: No nausea, vomiting, diarrhea +, reporting suprapubic abdominal pain.  HEMATOLOGY: No anemia, easy bruising or bleeding SKIN: No rash or lesion. MUSCULOSKELETAL: No joint pain or arthritis.   NEUROLOGIC: No tingling, numbness, weakness.  PSYCHIATRY: No anxiety or depression. DRUG ALLERGIES:   Allergies  Allergen Reactions  . Codeine Nausea And Vomiting  . Morphine And Related Nausea And Vomiting  . Novocain [Procaine] Hives    NO TROUBLE IN OR  . Other Nausea And Vomiting  . Percocet [Oxycodone-Acetaminophen] Nausea And Vomiting  . Sulfa Antibiotics Hives  . Tramadol Nausea And Vomiting    Note: upset stomach Note: upset stomach     VITALS:  Blood pressure 106/65, pulse 67, temperature 97.6 F (36.4 C), temperature source Oral, resp. rate 18, height 5\' 7"  (1.702 m), weight 99 kg, SpO2 97 %.  PHYSICAL EXAMINATION:  GENERAL:  78 y.o.-year-old patient lying in the bed with no acute distress.  EYES: Pupils equal, round, reactive to light and accommodation. No scleral icterus. Extraocular muscles intact.  HEENT: Head atraumatic, normocephalic. Oropharynx and nasopharynx clear.  NECK:  Supple, no jugular venous distention. No thyroid enlargement, no tenderness.  LUNGS: Normal breath sounds bilaterally, no wheezing, rales,rhonchi or crepitation. No use of accessory muscles of respiration.  CARDIOVASCULAR: S1, S2 normal. No murmurs, rubs, or gallops.  ABDOMEN: Soft,not tender in the  lower abdominal area,no rebound tenderness  nondistended. Bowel sounds present.  EXTREMITIES: No pedal edema, cyanosis, or clubbing.  NEUROLOGIC: Cranial nerves II through XII are intact. Muscle strength weak  in all extremities. Sensation intact. Gait not checked.  PSYCHIATRIC: The patient is alert and oriented x 1-2 .  SKIN: No obvious rash, lesion, or ulcer.  LABORATORY PANEL:   CBC Recent Labs  Lab 11/25/18 0645  WBC 13.2*  HGB 10.2*  HCT 31.3*  PLT 236   ------------------------------------------------------------------------------------------------------------------  Chemistries  Recent Labs  Lab 11/23/18 0640  11/25/18 0645  NA 136   < > 136  K 3.8   < > 3.3*  CL 110   < > 110  CO2 20*   < > 19*  GLUCOSE 106*   < > 89  BUN 29*   < > 24*  CREATININE 0.99   < > 0.86  CALCIUM 7.7*   < > 7.6*  MG  --   --  2.1  AST 20  --   --   ALT 18  --   --   ALKPHOS 71  --   --   BILITOT 0.5  --   --    < > = values in this interval not displayed.   ------------------------------------------------------------------------------------------------------------------  Cardiac Enzymes No results for input(s): TROPONINI in the last 168 hours. ------------------------------------------------------------------------------------------------------------------  RADIOLOGY:  No results found. EKG:   Orders placed or performed during the hospital encounter of 11/19/18  . ED EKG  . ED EKG  . EKG 12-Lead  . EKG 12-Lead  . EKG 12-Lead  . EKG 12-Lead    ASSESSMENT AND PLAN:  This  is a 78 year old male admitted for sepsis.  * Sepsis: present on admission Likely due to severe C. difficile colitis-patient continues to have loose watery diarrhea CT abdomen and pelvis with colitis, nonobstructing nephrolithiasis - Continue oral Vancocin vancomycin, IV Flagyl and rectal vancomycin.  Avoid any NSAIDs, narcotics if possible. -Appreciate GI, ID, surgery input.  They are following -Has  minimal abdominal soreness in the RLQ.  - tolerated CLD yesterday without issue. His diarrhea has slowly improved as well. Leukocytosis significantly improved   * Acute on chronic kidney injury 2: ARF Now resolved, renal function back to baseline  * Hypertension: Controlled; continue losartan, metoprolol, clonidine and Spironolactone   * BPH: Continue tamsulosin.   * Hypothyroidism: continue Synthroid, normal TSH  * Right upper extremity pain -resolved, Right wrist and forearm x-rays with no acute findings    Hyperlipidemia: Continue statin therapy   DVT prophylaxis: Heparin   GI prophylaxis: PPI per home regimen  We will get physical and Occupational Therapy evaluation to plan for discharge  All the records are reviewed and case discussed with Care Management/Social Worker. Management plans discussed with the patient, nursing, patient's son over phone and they are in agreement.  CODE STATUS:   TOTAL TIME TAKING CARE OF THIS PATIENT: 35 minutes.   POSSIBLE D/C IN 1-2 DAYS, DEPENDING ON CLINICAL CONDITION.  Note: This dictation was prepared with Dragon dictation along with smaller phrase technology. Any transcriptional errors that result from this process are unintentional.   Max Sane M.D on 11/25/2018 at 3:28 PM  Between 7am to 6pm - Pager - [phone] After 6pm go to [email or web address] - password [email or web address] [facility]  [facility]  Office  [phone]  CC: Primary care physician; Danelle Berry, NP

## 2018-11-25 NOTE — Progress Notes (Signed)
[facility] SURGICAL PROGRESS NOTE (cpt [phone])  Hospital Day(s): 6.   Interval History: Patient seen and examined, no acute events or new complaints overnight. Patient reports that his abdominal soreness has improved. Still with some abdominal soreness in the RLQ. No fever, chills, nausea, or emesis. He tolerated CLD yesterday without issue. His diarrhea has slowly improved as well. No other acute issues. Leukocytosis significantly improved to 13K this morning.     Review of Systems:  Constitutional: denies fever, chills  HEENT: denies cough or congestion  Respiratory: denies any shortness of breath  Cardiovascular: denies chest pain or palpitations  Gastrointestinal: + abdominal pain (improved), denied N/V,  + diarrhea (improved) Genitourinary: denies burning with urination or urinary frequency   Vital signs in last 24 hours: [min-max] current  Temp:  [97.8 F (36.6 C)-98.2 F (36.8 C)] 97.8 F (36.6 C) (10/13 0857) Pulse Rate:  [60-73] 72 (10/13 0857) Resp:  [18-20] 18 (10/13 0857) BP: (112-123)/(52-65) 118/57 (10/13 0857) SpO2:  [93 %-98 %] 95 % (10/13 0857)     Height: 5\' 7"  (170.2 cm) Weight: 99 kg BMI (Calculated): 34.18   Intake/Output last 2 shifts:  10/12 0701 - 10/13 0700 In: 1259.5 [P.O.:480; I.V.:679.5; IV Piggyback:100] Out: 700 [Urine:700]   Physical Exam:  Constitutional: alert, cooperative and no distress  HENT: normocephalic without obvious abnormality  Eyes: PERRL, EOM's grossly intact and symmetric  Respiratory: breathing non-labored at rest  Cardiovascular: regular rate and sinus rhythm  Gastrointestinal: Obese, soft, mild RLQ tenderness (improved compared to previous exams), improved, no rebound/no guarding, no peritonitis Musculoskeletal: no edema or wounds, motor and sensation grossly intact, NT   Labs:  CBC Latest Ref Rng & Units 11/25/2018 11/24/2018 11/23/2018  WBC 4.0 - 10.5 K/uL 13.2(H) 18.2(H) 23.6(H)  Hemoglobin 13.0 - 17.0  g/dL 10.2(L) 10.7(L) 10.5(L)  Hematocrit 39.0 - 52.0 % 31.3(L) 32.9(L) 32.0(L)  Platelets 150 - 400 K/uL 236 252 252   CMP Latest Ref Rng & Units 11/25/2018 11/24/2018 11/23/2018  Glucose 70 - 99 mg/dL 89 97 106(H)  BUN 8 - 23 mg/dL 24(H) 27(H) 29(H)  Creatinine 0.61 - 1.24 mg/dL 0.86 0.76 0.99  Sodium 135 - 145 mmol/L 136 135 136  Potassium 3.5 - 5.1 mmol/L 3.3(L) 3.2(L) 3.8  Chloride 98 - 111 mmol/L 110 112(H) 110  CO2 22 - 32 mmol/L 19(L) 20(L) 20(L)  Calcium 8.9 - 10.3 mg/dL 7.6(L) 7.6(L) 7.7(L)  Total Protein 6.5 - 8.1 g/dL - - 4.5(L)  Total Bilirubin 0.3 - 1.2 mg/dL - - 0.5  Alkaline Phos 38 - 126 U/L - - 71  AST 15 - 41 U/L - - 20  ALT 0 - 44 U/L - - 18     Imaging studies: No new pertinent imaging studies   Assessment/Plan: (ICD-10's: A04.72) 78 y.o. male with significantly improved leukocytosis and improving abdominal pain and diarrhea attributable to C. Diff colitis without evidence of peritonitis.   - Advance to full liquid diet this morning   - Continue Abx regimen (IV Flagyl, PO Vancomycin, Vancomycin Enema)               - Continue serial abdominal examination +/- KUBs              - No indication for surgical intervention             - pain control prn; antiemetics prn  - Monitor leukocytosis             - monitor abdominal examination;  on-going bowel function              - mobilization encouraged             - appreciate GI input             - further management per primary team   All of the above findings and recommendations were discussed with the patient, and the medical team, and all of patient's questions were answered to his expressed satisfaction. -- Edison Simon, PA-C [facility] 11/25/2018, 9:02 AM [phone] M-F: 7am - 4pm

## 2018-11-26 LAB — BASIC METABOLIC PANEL
Anion gap: 10 (ref 5–15)
BUN: 20 mg/dL (ref 8–23)
CO2: 16 mmol/L — ABNORMAL LOW (ref 22–32)
Calcium: 7.8 mg/dL — ABNORMAL LOW (ref 8.9–10.3)
Chloride: 111 mmol/L (ref 98–111)
Creatinine, Ser: 0.74 mg/dL (ref 0.61–1.24)
GFR calc Af Amer: >60 mL/min (ref >60)
GFR calc non Af Amer: >60 mL/min (ref >60)
Glucose, Bld: 81 mg/dL (ref 70–99)
Potassium: 3.5 mmol/L (ref 3.5–5.1)
Sodium: 137 mmol/L (ref 135–145)

## 2018-11-26 LAB — CBC
HCT: 32.1 % — ABNORMAL LOW (ref 39.0–52.0)
Hemoglobin: 10.5 g/dL — ABNORMAL LOW (ref 13.0–17.0)
MCH: 33.9 pg (ref 26.0–34.0)
MCHC: 32.7 g/dL (ref 30.0–36.0)
MCV: 103.5 fL — ABNORMAL HIGH (ref 80.0–100.0)
Platelets: 234 10*3/uL (ref 150–400)
RBC: 3.1 MIL/uL — ABNORMAL LOW (ref 4.22–5.81)
RDW: 13.8 % (ref 11.5–15.5)
WBC: 10.8 10*3/uL — ABNORMAL HIGH (ref 4.0–10.5)
nRBC: 0 % (ref 0.0–0.2)

## 2018-11-26 LAB — NOROVIRUS GROUP 1 & 2 BY PCR, STOOL
Norovirus 1 by PCR: NEGATIVE
Norovirus 2  by PCR: NEGATIVE

## 2018-11-26 MED ORDER — FUROSEMIDE 10 MG/ML IJ SOLN
40.0000 mg | Freq: Two times a day (BID) | INTRAMUSCULAR | Status: DC
  Administered 2018-11-26 – 2018-12-02 (×13): 40 mg via INTRAVENOUS
  Filled 2018-11-26 (×14): qty 4

## 2018-11-26 MED ORDER — TERBINAFINE HCL 1 % EX CREA
TOPICAL_CREAM | Freq: Two times a day (BID) | CUTANEOUS | Status: DC
  Administered 2018-11-26 – 2018-11-29 (×8): via TOPICAL
  Administered 2018-11-30: 1 via TOPICAL
  Administered 2018-11-30 – 2018-12-04 (×7): via TOPICAL
  Filled 2018-11-26 (×2): qty 12

## 2018-11-26 NOTE — Evaluation (Addendum)
Physical Therapy Evaluation Patient Details Name: Bobby Lane MRN: [id number] DOB: [birth year] Today's Date: 11/26/2018   History of Present Illness  Pt is 78 y.o. male that presented to ED for AMS, recent history of UTI, falls, generalized weakness. Workup showed sepsis secondary to C difficile, no surgical intervention at this time. PMH of HOH, skin cancer, COPD, GERD, kidney stones, HTN, thyroid disease. Pt also complained of RUE pain, xray negative for acute findings.    Clinical Impression  Patient alert, oriented to self, place, situation. Co-treat with OT to maximize pt participation, mobility, and safety. Pt reported generalized RUE and RLE pain (chronic RLE pain), pain in his hands and feet as well as scrotum due to increased edema during session. The patient reported that he lives with his ex-wife, either ambulated with bilateral SPC or RW depending on the day, did not need assistance with dressing/bathing prior to his hospital admission. Stated he has had falls prior to hospital admission as well.  The patient needed extended time to answer questions due to [facility] but also with slowed speech (appears to be baseline). Pt also mildly SOB during session, spO2 monitored intermittently and WFLs. The patient was able to move all extremities independently except for RLE due to knee pain. Supine to sit attempted with maxAx2 and HOB elevated. The patient presented with R posterior lean and progressed from modAx2 to maintain balance to CGA with RUE propped. Pt returned to supine due to fatigue with totalAx2.  Overall the patient demonstrated deficits (see "PT Problem List") that impede the patient's functional abilities, safety, and mobility and would benefit from skilled PT intervention. Recommendation is STR and supervision/assistance -24hrs.      Follow Up Recommendations SNF;Supervision/Assistance - 24 hour    Equipment Recommendations  Other (comment)(TBD at next venue of care)     Recommendations for Other Services       Precautions / Restrictions Precautions Precautions: Fall Restrictions Weight Bearing Restrictions: No      Mobility  Bed Mobility Overal bed mobility: Needs Assistance Bed Mobility: Supine to Sit;Sit to Supine     Supine to sit: HOB elevated;+2 for physical assistance;Max assist Sit to supine: Total assist;+2 for physical assistance;HOB elevated      Transfers                 General transfer comment: deferred due to fatigue/safety concerns  Ambulation/Gait             General Gait Details: deferred  Stairs            Wheelchair Mobility    Modified Rankin (Stroke Patients Only)       Balance Overall balance assessment: Needs assistance Sitting-balance support: Feet supported;Single extremity supported   Sitting balance - Comments: Pt needed significant assistance to achieve balance at EOB, progressed from modAx2, to CGA with single UE support Postural control: Right lateral lean                                   Pertinent Vitals/Pain Pain Assessment: Faces Faces Pain Scale: Hurts even more Pain Location: with mobility, pt reported R knee/leg pain as well as scrotal pain, and hand/feet pain due to swelling Pain Descriptors / Indicators: Tightness;Grimacing;Moaning Pain Intervention(s): Limited activity within patient's tolerance;Monitored during session;Repositioned    Home Living Family/patient expects to be discharged to:: Private residence Living Arrangements: Other (Comment)(ex-wife) Available Help at Discharge: Family Type of Home:  House Home Access: Stairs to enter   [employer] of Steps: a couple Home Layout: One level Home Equipment: Environmental consultant - 2 wheels;Cane - single point      Prior Function Level of Independence: Independent with assistive device(s)   Gait / Transfers Assistance Needed: Pt reported he will ambulate with RW at baseline, or will use a SPC in  each hand  ADL's / Homemaking Assistance Needed: pt reported he is independent in dressing/bathing at home        Hand Dominance   Dominant Hand: Right    Extremity/Trunk Assessment   Upper Extremity Assessment Upper Extremity Assessment: Defer to OT evaluation;Generalized weakness    Lower Extremity Assessment Lower Extremity Assessment: RLE deficits/detail;LLE deficits/detail RLE Deficits / Details: Pt unable to perform SLR independently due to weakness/pain, able to perform heel slide without physical assist RLE Sensation: WNL LLE Deficits / Details: Pt able to perform SLR and heel slide independently LLE Sensation: WNL       Communication   Communication: Other (comment);HOH(Pt HOH, but also with slow speech)  Cognition Arousal/Alertness: Awake/alert Behavior During Therapy: WFL for tasks assessed/performed Overall Cognitive Status: No family/caregiver present to determine baseline cognitive functioning                                 General Comments: Pt oriented to self, situation, place      General Comments General comments (skin integrity, edema, etc.): OT noted ringworm on RLE. Pt confirmed that it was ringworm.    Exercises Other Exercises Other Exercises: Pt instructed in ankle pumps and heel slides 3-4x a day to improve LE movement, help address swelling, maintain mobility Other Exercises: Pt educated in PLB sitting EOB due to SOB noted, reports of "swimmy headed" spO2 on room air WFLs   Assessment/Plan    PT Assessment Patient needs continued PT services  PT Problem List Decreased strength;Decreased mobility;Decreased range of motion;Decreased balance;Decreased activity tolerance;Pain;Decreased safety awareness       PT Treatment Interventions DME instruction;Therapeutic exercise;Gait training;Balance training;Stair training;Neuromuscular re-education;Therapeutic activities;Patient/family education    PT Goals (Current goals can be  found in the Care Plan section)  Acute Rehab PT Goals Patient Stated Goal: to get better, return to PLOF PT Goal Formulation: With patient Time For Goal Achievement: 12/10/18 Potential to Achieve Goals: Good    Frequency Min 2X/week   Barriers to discharge        Co-evaluation               AM-PAC PT "6 Clicks" Mobility  Outcome Measure Help needed turning from your back to your side while in a flat bed without using bedrails?: A Little Help needed moving from lying on your back to sitting on the side of a flat bed without using bedrails?: Total Help needed moving to and from a bed to a chair (including a wheelchair)?: Total Help needed standing up from a chair using your arms (e.g., wheelchair or bedside chair)?: Total Help needed to walk in hospital room?: Total Help needed climbing 3-5 steps with a railing? : Total 6 Click Score: 8    End of Session   Activity Tolerance: Patient limited by fatigue;Patient limited by pain Patient left: in bed;with call bell/phone within reach;with bed alarm set Nurse Communication: Mobility status PT Visit Diagnosis: Other abnormalities of gait and mobility (R26.89);Muscle weakness (generalized) (M62.81);Pain;Unsteadiness on feet (R26.81) Pain - Right/Left: Right Pain - part of body:  Knee    Time: 1001-1037 PT Time Calculation (min) (ACUTE ONLY): 36 min   Charges:   PT Evaluation $PT Eval Low Complexity: 1 Low PT Treatments $Therapeutic Activity: 8-22 mins        Lieutenant Diego PT, DPT 11:26 AM,11/26/18 [phone]

## 2018-11-26 NOTE — Progress Notes (Addendum)
[facility] SURGICAL PROGRESS NOTE (cpt [phone])  Hospital Day(s): 7.   Interval History: Patient seen and examined, no acute events or new complaints overnight. Patient reports he still has some abdominal soreness diffusely but no worsening pain. No nausea or emesis. Still with multiple BMs. He has been able to tolerate advancement to full liquids. Leukocytosis continues to improve, now 10K. No other complaints.   Review of Systems:  Constitutional: denies fever, chills  HEENT: denies cough or congestion  Respiratory: denies any shortness of breath  Cardiovascular: denies chest pain or palpitations  Gastrointestinal:+abdominal pain (improved), deniedN/V,+diarrhea (improved) Genitourinary: denies burning with urination or urinary frequency  Vital signs in last 24 hours: [min-max] current  Temp:  [97.6 F (36.4 C)-98 F (36.7 C)] 98 F (36.7 C) (10/14 0358) Pulse Rate:  [67-74] 73 (10/14 0358) Resp:  [18] 18 (10/14 0358) BP: (106-131)/(57-71) 126/67 (10/14 0358) SpO2:  [95 %-98 %] 98 % (10/14 0358)     Height: 5\' 7"  (170.2 cm) Weight: 99 kg BMI (Calculated): 34.18   Intake/Output last 2 shifts:  10/13 0701 - 10/14 0700 In: 3200.9 [P.O.:120; I.V.:2680.9; IV Piggyback:400] Out: 1200 [Urine:1200]   Physical Exam:  Constitutional: alert, cooperative and no distress  HENT: normocephalic without obvious abnormality  Eyes: PERRL, EOM's grossly intact and symmetric  Respiratory: breathing non-labored at rest  Cardiovascular: regular rate and sinus rhythm  Gastrointestinal: Obese, soft,mild RLQ tenderness (improved compared to previous exams), improved,no rebound/no guarding, no peritonitis Musculoskeletal: no edema or wounds, motor and sensation grossly intact, NT    Labs:  CBC Latest Ref Rng & Units 11/26/2018 11/25/2018 11/24/2018  WBC 4.0 - 10.5 K/uL 10.8(H) 13.2(H) 18.2(H)  Hemoglobin 13.0 - 17.0 g/dL 10.5(L) 10.2(L) 10.7(L)  Hematocrit 39.0 - 52.0 %  32.1(L) 31.3(L) 32.9(L)  Platelets 150 - 400 K/uL 234 236 252   CMP Latest Ref Rng & Units 11/25/2018 11/24/2018 11/23/2018  Glucose 70 - 99 mg/dL 89 97 106(H)  BUN 8 - 23 mg/dL 24(H) 27(H) 29(H)  Creatinine 0.61 - 1.24 mg/dL 0.86 0.76 0.99  Sodium 135 - 145 mmol/L 136 135 136  Potassium 3.5 - 5.1 mmol/L 3.3(L) 3.2(L) 3.8  Chloride 98 - 111 mmol/L 110 112(H) 110  CO2 22 - 32 mmol/L 19(L) 20(L) 20(L)  Calcium 8.9 - 10.3 mg/dL 7.6(L) 7.6(L) 7.7(L)  Total Protein 6.5 - 8.1 g/dL - - 4.5(L)  Total Bilirubin 0.3 - 1.2 mg/dL - - 0.5  Alkaline Phos 38 - 126 U/L - - 71  AST 15 - 41 U/L - - 20  ALT 0 - 44 U/L - - 18     Imaging studies: No new pertinent imaging studies   Assessment/Plan: (ICD-10's: A53.72) 78 y.o. male with continually improving leukocytosis and improving abdominal pain and diarrhea attributable to C. Diff colitis without evidence of peritonitis.   - ADAT  - Continue Abx regimen (IV Flagyl, PO Vancomycin, Vancomycin Enema)  - Continue serial abdominal examination +/- KUBs  - No indication for surgical intervention - pain control prn; antiemetics prn             - Monitor leukocytosis - monitor abdominal examination; on-going bowel function - mobilization encouraged - appreciate GI input - further management per primary team   - Patient continues to improve, no evidence of peritonitis or toxic megacolon, no surgical issues at this time, general surgery will sign off  All of the above findings and recommendations were discussed with the patient, and the medical team, and all of  patient's questions were answered to his expressed satisfaction.  -- Edison Simon, PA-C [facility] 11/26/2018, 7:15 AM [phone] M-F: 7am - 4pm  I saw and evaluated the patient.  I agree with the above documentation, exam, and plan, which I have edited where appropriate. Fredirick Maudlin  5:01 PM

## 2018-11-26 NOTE — NC FL2 (Signed)
[place] LEVEL OF CARE SCREENING TOOL     IDENTIFICATION  Patient Name: Bobby Lane Birthdate: 1940/10/12 Sex: male Admission Date (Current Location): 11/19/2018  [place] and [place] Number:  Engineering geologist and Address:  [facility], [address], [place], [place] [address]      Provider Number: [id number]  Attending Physician Name and Address:  Max Sane, MD  Relative Name and Phone Number:       Current Level of Care: Hospital Recommended Level of Care: [place] Prior Approval Number:    Date Approved/Denied:   PASRR Number: [id number]  Discharge Plan: SNF    Current Diagnoses: Patient Active Problem List   Diagnosis Date Noted  . Pressure injury of skin 11/20/2018  . Sepsis ([place]) 11/19/2018  . Pain due to onychomycosis of toenails of both feet 08/18/2018  . Fatigue 06/05/2013  . Bradycardia 06/05/2013  . Hypertension   . Gout   . Thyroid disease   . Coronary artery disease   . Renal disorder     Orientation RESPIRATION BLADDER Height & Weight     Self, Situation, Place  Normal Continent, Indwelling catheter Weight: 218 lb 4.8 oz (99 kg) Height:  5\' 7"  (170.2 cm)  BEHAVIORAL SYMPTOMS/MOOD NEUROLOGICAL BOWEL NUTRITION STATUS  (None) (None) Incontinent Diet(Full liquid)  AMBULATORY STATUS COMMUNICATION OF NEEDS Skin   Extensive Assist Verbally Skin abrasions, Bruising, Other (Comment), PU Stage and Appropriate Care(Amputation, MASD.) PU Stage 1 Dressing: (Right, left, medial: Foam every 3 days.)                     Personal Care Assistance Level of Assistance  Feeding, Bathing, Dressing Bathing Assistance: Maximum assistance Feeding assistance: Maximum assistance Dressing Assistance: Maximum assistance     Functional Limitations Info  Sight, Hearing, Speech Sight Info: Adequate Hearing Info: Impaired Speech Info: Adequate    SPECIAL CARE FACTORS FREQUENCY  PT (By licensed  PT), OT (By licensed OT)     PT Frequency: 5 x week OT Frequency: 5 x week            Contractures Contractures Info: Not present    Additional Factors Info  Code Status, Allergies, Isolation Precautions Code Status Info: Full code Allergies Info: Codeine, Morphine And Related, Novocain (Procaine), Other, Percocet (Oxycodone-acetaminophen), Sulfa Antibiotics, Tramadol     Isolation Precautions Info: Enteric precautions: C.diff.     Current Medications (11/26/2018):  This is the current hospital active medication list Current Facility-Administered Medications  Medication Dose Route Frequency Provider Last Rate Last Dose  . 0.9 %  sodium chloride infusion   Intravenous Continuous Max Sane, MD 75 mL/hr at 11/26/18 0209    . acetaminophen (TYLENOL) tablet 650 mg  650 mg Oral Q6H PRN Harrie Foreman, MD   650 mg at 11/25/18 2024   Or  . acetaminophen (TYLENOL) suppository 650 mg  650 mg Rectal Q6H PRN Harrie Foreman, MD      . albuterol (PROVENTIL) (2.5 MG/3ML) 0.083% nebulizer solution 2.5 mg  2.5 mg Nebulization Q4H PRN Gouru, Aruna, MD   2.5 mg at 11/20/18 1319  . allopurinol (ZYLOPRIM) tablet 300 mg  300 mg Oral Daily Harrie Foreman, MD   300 mg at 11/26/18 0849  . Chlorhexidine Gluconate Cloth 2 % PADS 6 each  6 each Topical Daily Nicholes Mango, MD   6 each at 11/26/18 0906  . cloNIDine (CATAPRES) tablet 0.1 mg  0.1 mg Oral BID Marcille Blanco,  Norva Riffle, MD   0.1 mg at 11/26/18 0905  . DULoxetine (CYMBALTA) DR capsule 30 mg  30 mg Oral BID Gouru, Aruna, MD   30 mg at 11/26/18 0850  . feeding supplement (BOOST / RESOURCE BREEZE) liquid 1 Container  1 Container Oral TID BM Lin Landsman, MD   1 Container at 11/26/18 0906  . furosemide (LASIX) injection 40 mg  40 mg Intravenous BID Max Sane, MD   40 mg at 11/26/18 1329  . heparin injection 5,000 Units  5,000 Units Subcutaneous Q8H Harrie Foreman, MD   5,000 Units at 11/26/18 1329  . levothyroxine (SYNTHROID) tablet  100 mcg  100 mcg Oral QAC breakfast Harrie Foreman, MD   100 mcg at 11/26/18 0511  . losartan (COZAAR) tablet 100 mg  100 mg Oral Daily Harrie Foreman, MD   100 mg at 11/26/18 0850  . metoprolol succinate (TOPROL-XL) 24 hr tablet 100 mg  100 mg Oral Daily Harrie Foreman, MD   100 mg at 11/26/18 0905  . metroNIDAZOLE (FLAGYL) IVPB 500 mg  500 mg Intravenous Q8H Ravishankar, Joellyn Quails, MD 100 mL/hr at 11/26/18 1334 500 mg at 11/26/18 1334  . ondansetron (ZOFRAN) tablet 4 mg  4 mg Oral Q6H PRN Harrie Foreman, MD       Or  . ondansetron [facility]) injection 4 mg  4 mg Intravenous Q6H PRN Harrie Foreman, MD      . phenol [facility]) mouth spray 1 spray  1 spray Mouth/Throat PRN Max Sane, MD   1 spray at 11/23/18 0513  . sodium chloride (OCEAN) 0.65 % nasal spray 1 spray  1 spray Each Nare PRN Max Sane, MD      . spironolactone (ALDACTONE) tablet 25 mg  25 mg Oral Daily Max Sane, MD   25 mg at 11/26/18 0851  . tamsulosin (FLOMAX) capsule 0.4 mg  0.4 mg Oral Daily Harrie Foreman, MD   0.4 mg at 11/26/18 0849  . terbinafine (LAMISIL) 1 % cream   Topical BID Max Sane, MD      . vancomycin (VANCOCIN) 50 mg/mL oral solution 500 mg  500 mg Oral Q6H Shanlever, Pierce Crane, RPH   500 mg at 11/26/18 1330     Discharge Medications: Please see discharge summary for a list of discharge medications.  Relevant Imaging Results:  Relevant Lab Results:   Additional Information SS#: [id number]  Candie Chroman, LCSW

## 2018-11-26 NOTE — Evaluation (Signed)
Occupational Therapy Evaluation Patient Details Name: Bobby Lane MRN: [id number] DOB: [birth year] Today's Date: 11/26/2018    History of Present Illness Pt is 78 y.o. male that presented to ED for AMS, recent history of UTI, falls, generalized weakness. Workup showed sepsis secondary to C difficile, no surgical intervention at this time. PMH of HOH, skin cancer, COPD, GERD, kidney stones, HTN, thyroid disease. Pt also complained of RUE pain, xray negative for acute findings.   Clinical Impression   Pt seen for OT/PT co-evaluation this date. Prior to hospital admission, pt reports he was independent in all ADLs, and ambulated in his home using either bilateral canes or a 2WW. Pt reports having both PT and OT in home services prior to admission. He also endorses a significant falls history and states "I would consider myself a falls risk" when asked about home/PLOF. While pt A&Ox4 he often responded to questions with a slowed and at times difficult to understand speech pattern. He also gave some questionable/contradictory answers t/o evaluation. Will continue to assess cognition at future sessions. Currently pt demonstrates impairments in strength, balance, Functional UE use/coordination, pain management, and activity tolerance requiring +2 max assist to come to sitting EOB and requiring moderate to CGA to maintain seated balance this date. Pt would benefit from skilled OT to address noted impairments and functional limitations (see below for any additional details) in order to maximize safety and independence while minimizing falls risk and caregiver burden.  Upon hospital discharge, recommend STR to maximize pt safety and return to PLOF.     Follow Up Recommendations  SNF    Equipment Recommendations  Other (comment)(TBD at next venue of care)    Recommendations for Other Services       Precautions / Restrictions Precautions Precautions: Fall Restrictions Weight Bearing Restrictions: No       Mobility Bed Mobility Overal bed mobility: Needs Assistance Bed Mobility: Supine to Sit;Sit to Supine     Supine to sit: HOB elevated;+2 for physical assistance;Max assist Sit to supine: Total assist;+2 for physical assistance;HOB elevated      Transfers                 General transfer comment: deferred due to fatigue/safety concerns    Balance Overall balance assessment: Needs assistance Sitting-balance support: Feet supported;Single extremity supported   Sitting balance - Comments: Pt needed significant assistance to achieve balance at EOB, progressed from modAx2, to CGA with single UE support Postural control: Right lateral lean                                 ADL either performed or assessed with clinical judgement   ADL Overall ADL's : Needs assistance/impaired                                       General ADL Comments: Pt requires Max assist +2 for bed mobility at time of OT evaluation. C/o difficulty with functional BUE use, as he is unable to grasp utensils to feed himself. Pt unable to tolerate reaching down to feet or gasping tightly with his hands due to increased pain. Currently total assist for toileting with foley catheter in place due to increased scrotal edema and significant bouts of bowel incontinence due to C-diff. Pt able to bring BUE up to face for brief grooming task at  EOB, but required set-up and cues to attend to task. Will continue to assess functional abilities at future OT sessions.     Vision         Perception     Praxis      Pertinent Vitals/Pain Pain Assessment: Faces Faces Pain Scale: Hurts even more Pain Location: with mobility, pt reported R knee/leg pain as well as scrotal pain, and hand/feet pain due to swelling Pain Descriptors / Indicators: Tightness;Grimacing;Moaning Pain Intervention(s): Limited activity within patient's tolerance;Monitored during session;Repositioned     Hand  Dominance Right   Extremity/Trunk Assessment Upper Extremity Assessment Upper Extremity Assessment: Generalized weakness(BUE grossly 3/5, with increased edema and swelling noted through hands and digits. Pt has hx of L thumb amputation, but states this does not limit function. At this time, pt complains of increased pain with attempts to grasp/hold items/utensils.)   Lower Extremity Assessment Lower Extremity Assessment: Defer to PT evaluation;Generalized weakness(Pt noted to have significant BLE edema with incr pain with mobility attempts) RLE Deficits / Details: Pt unable to perform SLR independently due to weakness/pain, able to perform heel slide without physical assist RLE Sensation: WNL LLE Deficits / Details: Pt able to perform SLR and heel slide independently LLE Sensation: WNL       Communication Communication Communication: HOH(Pt noted to have slowed speech t/o assessment at times difficult to understand)   Cognition Arousal/Alertness: Awake/alert Behavior During Therapy: WFL for tasks assessed/performed Overall Cognitive Status: No family/caregiver present to determine baseline cognitive functioning                                 General Comments: Pt oriented to self, situation, place; at times required re-direction to task and gave contradictory answers when asked about sensation, PLOF, etc.   General Comments  Pt noted to have open sore on his RLE with a ringworm appearnce. Pt endorses having ringworm on his right leg. RN notified.    Exercises Other Exercises Other Exercises: Pt instructed in ankle pumps and heel slides 3-4x a day to improve LE movement, help address swelling, maintain mobility Other Exercises: Pt educated in PLB sitting EOB due to SOB noted, reports of "swimmy headed" spO2 on room air WFLs   Shoulder Instructions      Home Living Family/patient expects to be discharged to:: Private residence Living Arrangements: Other (Comment)(ex  wife) Available Help at Discharge: Family Type of Home: House Home Access: Stairs to enter [employer] of Steps: a couple   Home Layout: One level               Home Equipment: Environmental consultant - 2 wheels;Cane - single point;Cane - quad          Prior Functioning/Environment Level of Independence: Independent with assistive device(s)  Gait / Transfers Assistance Needed: Pt reported he will ambulate with RW at baseline, or will use a cane in each hand. Pt at times difficult to understand and tangential, may be unreliable historian. Pt states "I would say I am a falls risk" and endorses significant falls history (greater than 5 in past 6 months) in his home. ADL's / Homemaking Assistance Needed: pt reported he is independent in dressing/bathing at home            OT Problem List: Decreased strength;Decreased coordination;Pain;Decreased cognition;Decreased range of motion;Increased edema;Decreased activity tolerance;Decreased safety awareness;Impaired balance (sitting and/or standing);Impaired UE functional use      OT Treatment/Interventions: Self-care/ADL  training;Balance training;Therapeutic exercise;Therapeutic activities;DME and/or AE instruction;Patient/family education    OT Goals(Current goals can be found in the care plan section) Acute Rehab OT Goals Patient Stated Goal: to get better, return to PLOF OT Goal Formulation: With patient Time For Goal Achievement: 12/10/18 Potential to Achieve Goals: Good ADL Goals Pt Will Perform Eating: sitting;with min assist;with min guard assist;with adaptive utensils(With LRAE PRN for improved safety and functional independence.) Pt Will Perform Grooming: sitting;with min assist;with min guard assist(With LRAE PRN for improved safety and functional independence.) Additional ADL Goal #1: pt will independently verbalize a plan to implement at least 3 learned falls prevention strategies into his daily routines/home environment for  improved safety and functional independence upon hospital DC.  OT Frequency: Min 1X/week   Barriers to D/C: Inaccessible home environment          Co-evaluation PT/OT/SLP Co-Evaluation/Treatment: Yes Reason for Co-Treatment: Complexity of the patient's impairments (multi-system involvement);For patient/therapist safety;To address functional/ADL transfers PT goals addressed during session: Mobility/safety with mobility;Strengthening/ROM;Balance OT goals addressed during session: ADL's and self-care      AM-PAC OT "6 Clicks" Daily Activity     Outcome Measure Help from another person eating meals?: A Lot Help from another person taking care of personal grooming?: A Lot Help from another person toileting, which includes using toliet, bedpan, or urinal?: Total Help from another person bathing (including washing, rinsing, drying)?: Total Help from another person to put on and taking off regular upper body clothing?: A Lot Help from another person to put on and taking off regular lower body clothing?: A Lot 6 Click Score: 10   End of Session Nurse Communication: Other (comment);Mobility status(Pt endorses having ringworm on RLE)  Activity Tolerance: Patient limited by pain;Patient limited by fatigue Patient left: in bed;with call bell/phone within reach;with bed alarm set  OT Visit Diagnosis: Other abnormalities of gait and mobility (R26.89);Muscle weakness (generalized) (M62.81);Pain Pain - Right/Left: Right Pain - part of body: Knee(& scrotum)                Time: [id number] OT Time Calculation (min): 32 min Charges:  OT General Charges $OT Visit: 1 Visit OT Evaluation $OT Eval Moderate Complexity: [place], M.S., OTR/L Ascom: [phone] 11/26/18, 12:08 PM

## 2018-11-26 NOTE — Clinical Social Work Note (Signed)
Patient very hard of hearing and unable to understand what CSW was saying. Per RN, patient not oriented to date/time but knows he is in the hospital and has a "belly infection." He gave CSW permission to call his son. Left CMS scores for SNF's within 25 miles of his zip code in his room. CSW left son a Advertising account executive.  Dayton Scrape, [place]

## 2018-11-26 NOTE — Progress Notes (Signed)
Pt states she has ring worm LLE. Notified MD Manuella Ghazi. See new orders

## 2018-11-26 NOTE — Progress Notes (Signed)
[place] [facility] [place] NAME: Bobby Lane    MR#:  [id number]  DATE OF BIRTH:  February 02, 1941  SUBJECTIVE:  Still with multiple liquid BMs. He has been able to tolerate advancement to full liquids. Leukocytosis continues to improve, now 10K. Very weak REVIEW OF SYSTEMS:  CONSTITUTIONAL: has  fever, no fatigue or weakness.  EYES: No blurred or double vision.  EARS, NOSE, AND THROAT: No tinnitus or ear pain.  RESPIRATORY: No cough, shortness of breath, wheezing or hemoptysis.  CARDIOVASCULAR: No chest pain, orthopnea, edema.  GASTROINTESTINAL: No nausea, vomiting, diarrhea +, reporting suprapubic abdominal pain.  HEMATOLOGY: No anemia, easy bruising or bleeding SKIN: No rash or lesion. MUSCULOSKELETAL: No joint pain or arthritis.   NEUROLOGIC: No tingling, numbness, weakness.  PSYCHIATRY: No anxiety or depression. DRUG ALLERGIES:   Allergies  Allergen Reactions  . Codeine Nausea And Vomiting  . Morphine And Related Nausea And Vomiting  . Novocain [Procaine] Hives    NO TROUBLE IN OR  . Other Nausea And Vomiting  . Percocet [Oxycodone-Acetaminophen] Nausea And Vomiting  . Sulfa Antibiotics Hives  . Tramadol Nausea And Vomiting    Note: upset stomach Note: upset stomach     VITALS:  Blood pressure 129/71, pulse 75, temperature 97.8 F (36.6 C), temperature source Oral, resp. rate 18, height 5\' 7"  (1.702 m), weight 99 kg, SpO2 98 %.  PHYSICAL EXAMINATION:  GENERAL:  78 y.o.-year-old patient lying in the bed with no acute distress.  EYES: Pupils equal, round, reactive to light and accommodation. No scleral icterus. Extraocular muscles intact.  HEENT: Head atraumatic, normocephalic. Oropharynx and nasopharynx clear.  NECK:  Supple, no jugular venous distention. No thyroid enlargement, no tenderness.  LUNGS: Normal breath sounds bilaterally, no wheezing, rales,rhonchi or crepitation. No use of accessory muscles of respiration.   CARDIOVASCULAR: S1, S2 normal. No murmurs, rubs, or gallops.  ABDOMEN: Soft,not tender in the lower abdominal area,no rebound tenderness  nondistended. Bowel sounds present.  EXTREMITIES: No pedal edema, cyanosis, or clubbing.  NEUROLOGIC: Cranial nerves II through XII are intact. Muscle strength weak  in all extremities. Sensation intact. Gait not checked.  PSYCHIATRIC: The patient is alert and oriented x 1-2 .  SKIN: No obvious rash, lesion, or ulcer.  LABORATORY PANEL:   CBC Recent Labs  Lab 11/26/18 0626  WBC 10.8*  HGB 10.5*  HCT 32.1*  PLT 234   ------------------------------------------------------------------------------------------------------------------  Chemistries  Recent Labs  Lab 11/23/18 0640  11/25/18 0645 11/26/18 0626  NA 136   < > 136 137  K 3.8   < > 3.3* 3.5  CL 110   < > 110 111  CO2 20*   < > 19* 16*  GLUCOSE 106*   < > 89 81  BUN 29*   < > 24* 20  CREATININE 0.99   < > 0.86 0.74  CALCIUM 7.7*   < > 7.6* 7.8*  MG  --   --  2.1  --   AST 20  --   --   --   ALT 18  --   --   --   ALKPHOS 71  --   --   --   BILITOT 0.5  --   --   --    < > = values in this interval not displayed.   ------------------------------------------------------------------------------------------------------------------  Cardiac Enzymes No results for input(s): TROPONINI in the last 168 hours. ------------------------------------------------------------------------------------------------------------------  RADIOLOGY:  No results found. EKG:  Orders placed or performed during the hospital encounter of 11/19/18  . ED EKG  . ED EKG  . EKG 12-Lead  . EKG 12-Lead  . EKG 12-Lead  . EKG 12-Lead    ASSESSMENT AND PLAN:  This is a 78 year old male admitted for sepsis.  * Sepsis: present on admission Likely due to severe C. difficile colitis-patient continues to have loose watery diarrhea CT abdomen and pelvis with colitis, nonobstructing nephrolithiasis -  Continue oral Vancocin vancomycin, IV Flagyl  -Appreciate GI, ID, surgery input.  They are following - tolerating FLD. Improving leukocytosis  * Acute on chronic kidney injury 2: ARF Now resolved, renal function back to baseline  * Hypertension: Controlled; continue losartan, metoprolol, clonidine and Spironolactone   * BPH: Continue tamsulosin.   * Hypothyroidism: continue Synthroid, normal TSH  * Right upper extremity pain -resolved, Right wrist and forearm x-rays with no acute findings    Hyperlipidemia: Continue statin therapy   DVT prophylaxis: Heparin   GI prophylaxis: PPI per home regimen   PT/OT recommends SNF - csw aware and working on it   All the records are reviewed and case discussed with Care Management/Social Worker. Management plans discussed with the patient, nursing, patient's son over phone and they are in agreement.  CODE STATUS:   TOTAL TIME TAKING CARE OF THIS PATIENT: 35 minutes.   POSSIBLE D/C IN 1-2 DAYS, DEPENDING ON CLINICAL CONDITION.  Note: This dictation was prepared with Dragon dictation along with smaller phrase technology. Any transcriptional errors that result from this process are unintentional.   Max Sane M.D on 11/26/2018 at 2:33 PM  Between 7am to 6pm - Pager - [phone] After 6pm go to [email or web address] - password [email or web address] [facility]  [facility] [facility]  Office  [phone]  CC: Primary care physician; Danelle Berry, NP

## 2018-11-26 NOTE — TOC Initial Note (Signed)
Transition of Care [facility]) - Initial/Assessment Note    Patient Details  Name: Bobby Lane MRN: [id number] Date of Birth: 1940-10-19  Transition of Care [facility]) CM/SW Contact:    Candie Chroman, LCSW Phone Number: 11/26/2018, 4:15 PM  Clinical Narrative: Received call back from son. CSW introduced role and explained that PT recommendations would be discussed. Patient's son agreeable to SNF placement. Patient has never been to a SNF before but his ex-wife has been to [facility]. No further concerns. CSW encouraged patient's son to contact CSW as needed. CSW will continue to follow patient and his son for support and facilitate discharge to SNF once medically stable.      Expected Discharge Plan: [place]     Patient Goals and CMS Choice Patient states their goals for this hospitalization and ongoing recovery are:: Patient not fully oriented. CMS Medicare.gov Compare Post Acute Care list provided to:: Patient(Instructed son how to access online.)    Expected Discharge Plan and Services Expected Discharge Plan: [place] Choice: [place] Living arrangements for the past 2 months: Single Family Home                                      Prior Living Arrangements/Services Living arrangements for the past 2 months: Single Family Home Lives with:: Other (Comment)(Ex-wife) Patient language and need for interpreter reviewed:: Yes Do you feel safe going back to the place where you live?: Yes      Need for Family Participation in Patient Care: Yes (Comment) Care giver support system in place?: Yes (comment) Current home services: DME Criminal Activity/Legal Involvement Pertinent to Current Situation/Hospitalization: No - Comment as needed  Activities of Daily Living Home Assistive Devices/Equipment: Hearing aid, Cane (specify quad or straight) ADL Screening (condition at time of admission) Patient's  cognitive ability adequate to safely complete daily activities?: No Is the patient deaf or have difficulty hearing?: Yes Does the patient have difficulty seeing, even when wearing glasses/contacts?: Yes Does the patient have difficulty concentrating, remembering, or making decisions?: Yes Patient able to express need for assistance with ADLs?: Yes Does the patient have difficulty dressing or bathing?: Yes Independently performs ADLs?: No Communication: Independent Dressing (OT): Needs assistance Is this a change from baseline?: Change from baseline, expected to last <3days Grooming: Needs assistance Is this a change from baseline?: Change from baseline, expected to last <3 days Feeding: Independent Bathing: Needs assistance Is this a change from baseline?: Change from baseline, expected to last <3 days Toileting: Needs assistance Is this a change from baseline?: Change from baseline, expected to last >3days In/Out Bed: Needs assistance Is this a change from baseline?: Change from baseline, expected to last >3 days Walks in Home: Independent with device (comment) Does the patient have difficulty walking or climbing stairs?: Yes Weakness of Legs: Both Weakness of Arms/Hands: None  Permission Sought/Granted Permission sought to share information with : Facility Sport and exercise psychologist, Family Supports Permission granted to share information with : Yes, Verbal Permission Granted  Share Information with NAME: Jarett Hammack  Permission granted to share info w AGENCY: SNF's  Permission granted to share info w Relationship: Son  Permission granted to share info w Contact Information: [phone]  Emotional Assessment Appearance:: Appears stated age Attitude/Demeanor/Rapport: Unable to Assess Affect (typically observed): Unable to Assess Orientation: : Oriented to Self, Oriented to Place, Oriented  to Situation Alcohol / Substance Use: Never Used Psych Involvement: No (comment)  Admission  diagnosis:  Weakness generalized [R53.1] Hypoxia [R09.02] Atrial fibrillation with rapid ventricular response (HCC) [I48.91] Fever, unspecified fever cause [R50.9] Sepsis, due to unspecified organism, unspecified whether acute organ dysfunction present [facility]) [A41.9] Patient Active Problem List   Diagnosis Date Noted  . Pressure injury of skin 11/20/2018  . Sepsis ([place]) 11/19/2018  . Pain due to onychomycosis of toenails of both feet 08/18/2018  . Fatigue 06/05/2013  . Bradycardia 06/05/2013  . Hypertension   . Gout   . Thyroid disease   . Coronary artery disease   . Renal disorder    PCP:  Danelle Berry, NP Pharmacy:   [facility] [place], [address] [place] [address] Phone: [phone] Fax: [phone]  [place] [address] ([address]), [place]) [place] [address] Phone: [phone] Fax: [place], [place]. [place]  [address] Phone: [phone] Fax: [phone]     Social Determinants of Health (SDOH) Interventions    Readmission Risk Interventions No flowsheet data found.

## 2018-11-27 DIAGNOSIS — K567 Ileus, unspecified: Secondary | ICD-10-CM

## 2018-11-27 LAB — BASIC METABOLIC PANEL
Anion gap: 8 (ref 5–15)
BUN: 17 mg/dL (ref 8–23)
CO2: 21 mmol/L — ABNORMAL LOW (ref 22–32)
Calcium: 7.9 mg/dL — ABNORMAL LOW (ref 8.9–10.3)
Chloride: 110 mmol/L (ref 98–111)
Creatinine, Ser: 0.86 mg/dL (ref 0.61–1.24)
GFR calc Af Amer: >60 mL/min (ref >60)
GFR calc non Af Amer: >60 mL/min (ref >60)
Glucose, Bld: 92 mg/dL (ref 70–99)
Potassium: 3.6 mmol/L (ref 3.5–5.1)
Sodium: 139 mmol/L (ref 135–145)

## 2018-11-27 LAB — HEPATIC FUNCTION PANEL
ALT: 11 U/L (ref 0–44)
AST: 15 U/L (ref 15–41)
Albumin: 1.8 g/dL — ABNORMAL LOW (ref 3.5–5.0)
Alkaline Phosphatase: 46 U/L (ref 38–126)
Bilirubin, Direct: <0.1 mg/dL (ref 0.0–0.2)
Total Bilirubin: 0.5 mg/dL (ref 0.3–1.2)
Total Protein: 4.4 g/dL — ABNORMAL LOW (ref 6.5–8.1)

## 2018-11-27 LAB — CBC
HCT: 33.1 % — ABNORMAL LOW (ref 39.0–52.0)
Hemoglobin: 10.8 g/dL — ABNORMAL LOW (ref 13.0–17.0)
MCH: 34.1 pg — ABNORMAL HIGH (ref 26.0–34.0)
MCHC: 32.6 g/dL (ref 30.0–36.0)
MCV: 104.4 fL — ABNORMAL HIGH (ref 80.0–100.0)
Platelets: 272 10*3/uL (ref 150–400)
RBC: 3.17 MIL/uL — ABNORMAL LOW (ref 4.22–5.81)
RDW: 14.3 % (ref 11.5–15.5)
WBC: 11.1 10*3/uL — ABNORMAL HIGH (ref 4.0–10.5)
nRBC: 0 % (ref 0.0–0.2)

## 2018-11-27 MED ORDER — VANCOMYCIN 50 MG/ML ORAL SOLUTION
125.0000 mg | Freq: Four times a day (QID) | ORAL | Status: DC
  Administered 2018-11-27 – 2018-12-04 (×29): 125 mg via ORAL
  Filled 2018-11-27 (×34): qty 2.5

## 2018-11-27 MED ORDER — FUROSEMIDE 10 MG/ML IJ SOLN
40.0000 mg | INTRAMUSCULAR | Status: AC
  Administered 2018-11-27: 40 mg via INTRAVENOUS

## 2018-11-27 NOTE — Progress Notes (Signed)
[place] at [place] NAME: Bobby Lane    MR#:  [id number]  DATE OF BIRTH:  1940-03-03  SUBJECTIVE:  Continues to improve, less frequency of bowel movements REVIEW OF SYSTEMS:  CONSTITUTIONAL: has  fever, no fatigue or weakness.  EYES: No blurred or double vision.  EARS, NOSE, AND THROAT: No tinnitus or ear pain.  RESPIRATORY: No cough, shortness of breath, wheezing or hemoptysis.  CARDIOVASCULAR: No chest pain, orthopnea, edema.  GASTROINTESTINAL: No nausea, vomiting, diarrhea +, reporting suprapubic abdominal pain.  HEMATOLOGY: No anemia, easy bruising or bleeding SKIN: No rash or lesion. MUSCULOSKELETAL: No joint pain or arthritis.   NEUROLOGIC: No tingling, numbness, weakness.  PSYCHIATRY: No anxiety or depression. DRUG ALLERGIES:   Allergies  Allergen Reactions  . Codeine Nausea And Vomiting  . Morphine And Related Nausea And Vomiting  . Novocain [Procaine] Hives    NO TROUBLE IN OR  . Other Nausea And Vomiting  . Percocet [Oxycodone-Acetaminophen] Nausea And Vomiting  . Sulfa Antibiotics Hives  . Tramadol Nausea And Vomiting    Note: upset stomach Note: upset stomach     VITALS:  Blood pressure 111/60, pulse 62, temperature 98.2 F (36.8 C), temperature source Oral, resp. rate 18, height 5\' 7"  (1.702 m), weight 99 kg, SpO2 94 %.  PHYSICAL EXAMINATION:  GENERAL:  78 y.o.-year-old patient lying in the bed with no acute distress.  EYES: Pupils equal, round, reactive to light and accommodation. No scleral icterus. Extraocular muscles intact.  HEENT: Head atraumatic, normocephalic. Oropharynx and nasopharynx clear.  NECK:  Supple, no jugular venous distention. No thyroid enlargement, no tenderness.  LUNGS: Normal breath sounds bilaterally, no wheezing, rales,rhonchi or crepitation. No use of accessory muscles of respiration.  CARDIOVASCULAR: S1, S2 normal. No murmurs, rubs, or gallops.  ABDOMEN: Soft,not tender in the  lower abdominal area,no rebound tenderness  nondistended. Bowel sounds present.  EXTREMITIES: No pedal edema, cyanosis, or clubbing.  NEUROLOGIC: Cranial nerves II through XII are intact. Muscle strength weak  in all extremities. Sensation intact. Gait not checked.  PSYCHIATRIC: The patient is alert and oriented x 1-2 .  SKIN: No obvious rash, lesion, or ulcer.  LABORATORY PANEL:   CBC Recent Labs  Lab 11/27/18 0517  WBC 11.1*  HGB 10.8*  HCT 33.1*  PLT 272   ------------------------------------------------------------------------------------------------------------------  Chemistries  Recent Labs  Lab 11/23/18 0640  11/25/18 0645  11/27/18 0517  NA 136   < > 136   < > 139  K 3.8   < > 3.3*   < > 3.6  CL 110   < > 110   < > 110  CO2 20*   < > 19*   < > 21*  GLUCOSE 106*   < > 89   < > 92  BUN 29*   < > 24*   < > 17  CREATININE 0.99   < > 0.86   < > 0.86  CALCIUM 7.7*   < > 7.6*   < > 7.9*  MG  --   --  2.1  --   --   AST 20  --   --   --   --   ALT 18  --   --   --   --   ALKPHOS 71  --   --   --   --   BILITOT 0.5  --   --   --   --    < > =  values in this interval not displayed.   ------------------------------------------------------------------------------------------------------------------  Cardiac Enzymes No results for input(s): TROPONINI in the last 168 hours. ------------------------------------------------------------------------------------------------------------------  RADIOLOGY:  No results found. EKG:   Orders placed or performed during the hospital encounter of 11/19/18  . ED EKG  . ED EKG  . EKG 12-Lead  . EKG 12-Lead  . EKG 12-Lead  . EKG 12-Lead   ASSESSMENT AND PLAN:  This is a 78 year old male admitted for sepsis.  * Sepsis: present on admission Likely due to severe C. difficile colitis-clinically improving - Continue oral Vancocin vancomycin, IV Flagyl  -We will start soft diet, encourage ambulation, discontinue Foley  * Acute on  chronic kidney injury 2: ARF Now resolved, renal function back to baseline  * Hypertension: Controlled; continue losartan, metoprolol, clonidine and Spironolactone   * BPH: Continue tamsulosin.   * Hypothyroidism: continue Synthroid, normal TSH  * Right upper extremity pain -resolved, Right wrist and forearm x-rays with no acute findings    Hyperlipidemia: Continue statin therapy   DVT prophylaxis: Heparin   GI prophylaxis: PPI per home regimen   PT/OT recommends SNF - csw aware and working on it, I have discussed with patient's son yesterday and he was agreeable for [place] the records are reviewed and case discussed with Care Management/Social Worker. Management plans discussed with the patient, nursing, patient's son over phone and they are in agreement.  CODE STATUS:   TOTAL TIME TAKING CARE OF THIS PATIENT: 35 minutes.   POSSIBLE D/C IN 1 DAYS, DEPENDING ON CLINICAL CONDITION.  Note: This dictation was prepared with Dragon dictation along with smaller phrase technology. Any transcriptional errors that result from this process are unintentional.   Max Sane M.D on 11/27/2018 at 10:44 AM  Between 7am to 6pm - Pager - [phone] After 6pm go to [email or web address] - password [email or web address] [facility]  [place] [facility]  Office  [phone]  CC: Primary care physician; Danelle Berry, NP

## 2018-11-27 NOTE — TOC Progression Note (Addendum)
Transition of Care [facility]) - Progression Note    Patient Details  Name: Bobby Lane MRN: [id number] Date of Birth: 1941/01/09  Transition of Care [facility]) CM/SW [place], LCSW Phone Number: 11/27/2018, 11:22 AM  Clinical Narrative: Received phone call from son confirming [name] is [facility] preference. Admissions coordinator will review referral and let CSW know decision.    12:13 pm: [facility] has offered a bed and is starting Ship broker. MD and son aware. Asked MD for COVID test so we will have results for either Friday/Monday discharge.  Expected Discharge Plan: [place]    Expected Discharge Plan and Services Expected Discharge Plan: [place] Choice: [place] arrangements for the past 2 months: Single Family Home                                       Social Determinants of Health (SDOH) Interventions    Readmission Risk Interventions No flowsheet data found.

## 2018-11-27 NOTE — Progress Notes (Signed)
Pt improving Diarrhea one episode today No pain abdomen  Patient Vitals for the past 24 hrs:  BP Temp Temp src Pulse Resp SpO2  11/27/18 2004 108/62 98.2 F (36.8 C) Oral 85 18 95 %  11/27/18 1721 102/63 97.8 F (36.6 C) - 71 19 94 %  11/27/18 0745 111/60 98.2 F (36.8 C) Oral 62 - 94 %  11/27/18 0513 (!) 148/90 97.8 F (36.6 C) - 71 18 94 %   Lab Results Recent Labs    11/26/18 0626 11/27/18 0517  WBC 10.8* 11.1*  HGB 10.5* 10.8*  HCT 32.1* 33.1*  NA 137 139  K 3.5 3.6  CL 111 110  CO2 16* 21*  BUN 20 17  CREATININE 0.74 0.86   Liver Panel Recent Labs    11/27/18 0626  PROT 4.4*  ALBUMIN 1.8*  AST 15  ALT 11  ALKPHOS 32  BILITOT 0.5  BILIDIR <0.1  IBILI NOT CALCULATED   Assessment/Plan:  Cdiff colitis with ileus- the latter has resolved  is on high dose vanco and IV metronidazole- Will Dc latter- change vanco to 125mg  PO Q6 for 10 more days. IF wbc increases or he starts to get worse will increase the dose

## 2018-11-27 NOTE — Progress Notes (Signed)
Physical Therapy Treatment Patient Details Name: Bobby Lane MRN: [id number] DOB: 03-24-1940 Today's Date: 11/27/2018    History of Present Illness Pt is 78 y.o. male that presented to ED for AMS, recent history of UTI, falls, generalized weakness. Workup showed sepsis secondary to C difficile, no surgical intervention at this time. PMH of HOH, skin cancer, COPD, GERD, kidney stones, HTN, thyroid disease. Pt also complained of RUE pain, xray negative for acute findings.    PT Comments    Awakens easily.  Participated in exercises as described below while awaiting +2 assist.  To edge of bed with mod a x 2.  Tactile cues for hand placement but once he was able to reach rail put good effort in to assisting despite yelling in pain over swollen scrotum.  Once sitting, only required min guard for safety.  Increased time given before standing x 2 to walker and mod a x 2 to stand from raised bed but min a x 2 once up.  He was able to stand 30 seconds each attempt.  He was unable to step in place and relied heavily on walker for support.  Difficulty grasping walker handles due to edema in hands.  Pt returned to bed with max a x 2 and generally fatigued.  Pt aware of overall weakness and his inability to care for himself at home at this time.  SNF remains appropriate upon discharge.   Follow Up Recommendations  SNF;Supervision/Assistance - 24 hour     Equipment Recommendations       Recommendations for Other Services       Precautions / Restrictions Precautions Precautions: Fall Restrictions Weight Bearing Restrictions: No Other Position/Activity Restrictions: enteric    Mobility  Bed Mobility Overal bed mobility: Needs Assistance Bed Mobility: Supine to Sit;Sit to Supine     Supine to sit: HOB elevated;+2 for physical assistance;Mod assist Sit to supine: +2 for physical assistance;HOB elevated;Max assist      Transfers Overall transfer level: Needs assistance Equipment used:  Rolling walker (2 wheeled) Transfers: Sit to/from Stand Sit to Stand: Mod assist;+2 physical assistance         General transfer comment: Stood x 2 - able to stand well with flexed posture but unable to step  Ambulation/Gait             General Gait Details: Futures trader    Modified Rankin (Stroke Patients Only)       Balance Overall balance assessment: Needs assistance Sitting-balance support: Feet supported Sitting balance-Leahy Scale: Fair Sitting balance - Comments: able to sit with min gaurd today   Standing balance support: Bilateral upper extremity supported Standing balance-Leahy Scale: Poor Standing balance comment: heavy reliance on walker with forward posture.  +2 for safety.                            Cognition Arousal/Alertness: Awake/alert Behavior During Therapy: WFL for tasks assessed/performed Overall Cognitive Status: Within Functional Limits for tasks assessed                                        Exercises Other Exercises Other Exercises: ankle pumps and SLR x 10 prior to bed mobility while awaiting +2 assist.  Too fatigued after standing to complete ex's.  General Comments        Pertinent Vitals/Pain Pain Assessment: Faces Faces Pain Scale: Hurts even more Pain Location: Yells out with supine -> sit transfer due to edema in scrotum Pain Descriptors / Indicators: Tightness;Grimacing;Moaning    Home Living                      Prior Function            PT Goals (current goals can now be found in the care plan section) Progress towards PT goals: Progressing toward goals    Frequency    Min 2X/week      PT Plan Current plan remains appropriate    Co-evaluation              AM-PAC PT "6 Clicks" Mobility   Outcome Measure  Help needed turning from your back to your side while in a flat bed without using bedrails?: A Little Help  needed moving from lying on your back to sitting on the side of a flat bed without using bedrails?: A Lot Help needed moving to and from a bed to a chair (including a wheelchair)?: Total Help needed standing up from a chair using your arms (e.g., wheelchair or bedside chair)?: A Lot Help needed to walk in hospital room?: Total Help needed climbing 3-5 steps with a railing? : Total 6 Click Score: 10    End of Session Equipment Utilized During Treatment: Gait belt Activity Tolerance: Patient tolerated treatment well;Patient limited by fatigue Patient left: in bed;with call bell/phone within reach;with bed alarm set   Pain - Right/Left: Right Pain - part of body: Knee     Time: [id number] PT Time Calculation (min) (ACUTE ONLY): 24 min  Charges:  $Therapeutic Exercise: 8-22 mins $Therapeutic Activity: 8-22 mins                    Chesley Noon, PTA 11/27/18, 12:20 PM

## 2018-11-28 LAB — BASIC METABOLIC PANEL
Anion gap: 6 (ref 5–15)
BUN: 19 mg/dL (ref 8–23)
CO2: 24 mmol/L (ref 22–32)
Calcium: 8.1 mg/dL — ABNORMAL LOW (ref 8.9–10.3)
Chloride: 107 mmol/L (ref 98–111)
Creatinine, Ser: 0.99 mg/dL (ref 0.61–1.24)
GFR calc Af Amer: >60 mL/min (ref >60)
GFR calc non Af Amer: >60 mL/min (ref >60)
Glucose, Bld: 91 mg/dL (ref 70–99)
Potassium: 3.4 mmol/L — ABNORMAL LOW (ref 3.5–5.1)
Sodium: 137 mmol/L (ref 135–145)

## 2018-11-28 LAB — CBC
HCT: 33.6 % — ABNORMAL LOW (ref 39.0–52.0)
Hemoglobin: 10.9 g/dL — ABNORMAL LOW (ref 13.0–17.0)
MCH: 33.4 pg (ref 26.0–34.0)
MCHC: 32.4 g/dL (ref 30.0–36.0)
MCV: 103.1 fL — ABNORMAL HIGH (ref 80.0–100.0)
Platelets: 274 10*3/uL (ref 150–400)
RBC: 3.26 MIL/uL — ABNORMAL LOW (ref 4.22–5.81)
RDW: 14.2 % (ref 11.5–15.5)
WBC: 9.4 10*3/uL (ref 4.0–10.5)
nRBC: 0 % (ref 0.0–0.2)

## 2018-11-28 LAB — NOVEL CORONAVIRUS, NAA (HOSP ORDER, SEND-OUT TO REF LAB; TAT 18-24 HRS): SARS-CoV-2, NAA: NOT DETECTED

## 2018-11-28 MED ORDER — POTASSIUM CHLORIDE CRYS ER 20 MEQ PO TBCR
40.0000 meq | EXTENDED_RELEASE_TABLET | Freq: Once | ORAL | Status: AC
  Administered 2018-11-28: 40 meq via ORAL
  Filled 2018-11-28: qty 2

## 2018-11-28 MED ORDER — ROSUVASTATIN CALCIUM 10 MG PO TABS
5.0000 mg | ORAL_TABLET | Freq: Every day | ORAL | Status: DC
  Administered 2018-11-28 – 2018-12-03 (×6): 5 mg via ORAL
  Filled 2018-11-28 (×7): qty 1

## 2018-11-28 NOTE — Plan of Care (Signed)

## 2018-11-28 NOTE — Care Management Important Message (Signed)
Important Message  Patient Details  Name: Bobby Lane MRN: [id number] Date of Birth: [birth year]   Medicare Important Message Given:  Yes     Norina Buzzard, RN 11/28/2018, 11:33 AM

## 2018-11-28 NOTE — Progress Notes (Signed)
[place] at [place] NAME: Bobby Lane    MR#:  [id number]  DATE OF BIRTH:  1940-08-12  SUBJECTIVE:  Continues to improve, less frequency of bowel movements, on oral vancomycin REVIEW OF SYSTEMS:  CONSTITUTIONAL: has  fever, no fatigue or weakness.  EYES: No blurred or double vision.  EARS, NOSE, AND THROAT: No tinnitus or ear pain.  RESPIRATORY: No cough, shortness of breath, wheezing or hemoptysis.  CARDIOVASCULAR: No chest pain, orthopnea, edema.  GASTROINTESTINAL: No nausea, vomiting, diarrhea +, reporting suprapubic abdominal pain.  HEMATOLOGY: No anemia, easy bruising or bleeding SKIN: No rash or lesion. MUSCULOSKELETAL: No joint pain or arthritis.   NEUROLOGIC: No tingling, numbness, weakness.  PSYCHIATRY: No anxiety or depression. DRUG ALLERGIES:   Allergies  Allergen Reactions  . Codeine Nausea And Vomiting  . Morphine And Related Nausea And Vomiting  . Novocain [Procaine] Hives    NO TROUBLE IN OR  . Other Nausea And Vomiting  . Percocet [Oxycodone-Acetaminophen] Nausea And Vomiting  . Sulfa Antibiotics Hives  . Tramadol Nausea And Vomiting    Note: upset stomach Note: upset stomach     VITALS:  Blood pressure 127/68, pulse 82, temperature 98.3 F (36.8 C), temperature source Oral, resp. rate (!) 21, height 5\' 7"  (1.702 m), weight 97 kg, SpO2 94 %.  PHYSICAL EXAMINATION:  GENERAL:  78 y.o.-year-old patient lying in the bed with no acute distress.  EYES: Pupils equal, round, reactive to light and accommodation. No scleral icterus. Extraocular muscles intact.  HEENT: Head atraumatic, normocephalic. Oropharynx and nasopharynx clear.  NECK:  Supple, no jugular venous distention. No thyroid enlargement, no tenderness.  LUNGS: Normal breath sounds bilaterally, no wheezing, rales,rhonchi or crepitation. No use of accessory muscles of respiration.  CARDIOVASCULAR: S1, S2 normal. No murmurs, rubs, or gallops.   ABDOMEN: Soft,not tender in the lower abdominal area,no rebound tenderness  nondistended. Bowel sounds present.  EXTREMITIES: No pedal edema, cyanosis, or clubbing.  NEUROLOGIC: Cranial nerves II through XII are intact. Muscle strength weak  in all extremities. Sensation intact. Gait not checked.  PSYCHIATRIC: The patient is alert and oriented x 1-2 .  SKIN: No obvious rash, lesion, or ulcer.  LABORATORY PANEL:   CBC Recent Labs  Lab 11/28/18 0643  WBC 9.4  HGB 10.9*  HCT 33.6*  PLT 274   ------------------------------------------------------------------------------------------------------------------  Chemistries  Recent Labs  Lab 11/25/18 0645  11/27/18 0626 11/28/18 0643  NA 136   < >  --  137  K 3.3*   < >  --  3.4*  CL 110   < >  --  107  CO2 19*   < >  --  24  GLUCOSE 89   < >  --  91  BUN 24*   < >  --  19  CREATININE 0.86   < >  --  0.99  CALCIUM 7.6*   < >  --  8.1*  MG 2.1  --   --   --   AST  --   --  15  --   ALT  --   --  11  --   ALKPHOS  --   --  46  --   BILITOT  --   --  0.5  --    < > = values in this interval not displayed.   ------------------------------------------------------------------------------------------------------------------  Cardiac Enzymes No results for input(s): TROPONINI in the last 168 hours. ------------------------------------------------------------------------------------------------------------------  RADIOLOGY:  No  results found. EKG:   Orders placed or performed during the hospital encounter of 11/19/18  . ED EKG  . ED EKG  . EKG 12-Lead  . EKG 12-Lead  . EKG 12-Lead  . EKG 12-Lead   ASSESSMENT AND PLAN:  This is a 78 year old male admitted for sepsis.  * Sepsis: present on admission Likely due to severe C. difficile colitis-clinically improving - vanco to 125mg  PO Q6 for 9 more days -Tolerating soft diet, encourage ambulation, discontinue Foley  * Acute on chronic kidney injury 2: ARF Now resolved, renal  function back to baseline  * Hypertension: Controlled; continue losartan, metoprolol, clonidine and Spironolactone   * BPH: Continue tamsulosin.   * Hypothyroidism: continue Synthroid, normal TSH  * Right upper extremity pain -resolved, Right wrist and forearm x-rays with no acute findings    Hyperlipidemia: Continue statin therapy   DVT prophylaxis: Heparin   GI prophylaxis: PPI per home regimen   PT/OT recommends SNF - csw aware and working on it, I have discussed with patient's son and he was agreeable for [facility].  Per clinical social worker we still do not have insurance authorization   All the records are reviewed and case discussed with Care Management/Social Worker. Management plans discussed with the patient, nursing, patient's son over phone and they are in agreement.  CODE STATUS:   TOTAL TIME TAKING CARE OF THIS PATIENT: 35 minutes.   POSSIBLE D/C IN 3-4 DAYS, DEPENDING ON CLINICAL CONDITION.  Note: This dictation was prepared with Dragon dictation along with smaller phrase technology. Any transcriptional errors that result from this process are unintentional.   Max Sane M.D on 11/28/2018 at 2:49 PM  Between 7am to 6pm - Pager - [phone] After 6pm go to [email or web address] - password [email or web address] [facility]  [place] [facility]  Office  [phone]  CC: Primary care physician; Danelle Berry, NP

## 2018-11-28 NOTE — Progress Notes (Signed)
Occupational Therapy Treatment Patient Details Name: Bobby Lane MRN: [id number] DOB: Jun 24, 1940 Today's Date: 11/28/2018    History of present illness Pt is 78 y.o. male that presented to ED for AMS, recent history of UTI, falls, generalized weakness. Workup showed sepsis secondary to C difficile, no surgical intervention at this time. PMH of HOH, skin cancer, COPD, GERD, kidney stones, HTN, thyroid disease. Pt also complained of RUE pain, xray negative for acute findings.   OT comments  Bobby Lane was seen for OT treatment on this date. Upon arrival to room pt supine in bed and easily awoken. Pt initially stating he was cold and "can't do anything today", pt educated on importance of activity while in the hospital, and pt agreeable OT tx. OT engaged pt in [place] listed below as well as provided gentle retrograde massage to BUE. Pt educated in safe positioning and elevation of his RUE as it appears to have increased swelling as compaired to his LUE. Pt verbalized understanding of instruction provided. Pt making progress toward goals and continues to benefit from skilled OT services to maximize return to PLOF and minimize risk of future falls, injury, caregiver burden, and readmission. Will continue to follow POC. Discharge recommendation remains appropriate.     Follow Up Recommendations  SNF    Equipment Recommendations  Other (comment)(TBD at next venue of care)    Recommendations for Other Services      Precautions / Restrictions Precautions Precautions: Fall Restrictions Weight Bearing Restrictions: No Other Position/Activity Restrictions: enteric       Mobility Bed Mobility Overal bed mobility: Needs Assistance Bed Mobility: Supine to Sit;Sit to Supine     Supine to sit: HOB elevated;+2 for physical assistance;Mod assist Sit to supine: +2 for physical assistance;HOB elevated;Max assist   General bed mobility comments: Deferred, pt stating "I just can't do  anything today" and endorsing feeling cold. Limited activity to bed level ther-ex this date.  Transfers                      Balance Overall balance assessment: Independent                                         ADL either performed or assessed with clinical judgement   ADL Overall ADL's : Needs assistance/impaired Eating/Feeding: Set up;Bed level;Sitting;Minimal assistance Eating/Feeding Details (indicate cue type and reason): Pt continues to demonstrate decreased [place] with swelling/edema t/o BUE RUE>LUE. Pt noted with lunch tray prior to session, but states he didn't finish as eating in his bed was difficult and he got tired. Will continue to assess. Pt may benefit from feeding assistance as he continues to be functionally limited in [place].                                 Functional mobility during ADLs: +2 for safety/equipment;Maximal assistance       Vision Patient Visual Report: No change from baseline     Perception     Praxis      Cognition Arousal/Alertness: Awake/alert;Lethargic Behavior During Therapy: WFL for tasks assessed/performed Overall Cognitive Status: Within Functional Limits for tasks assessed  Exercises General Exercises - Upper Extremity Shoulder Flexion: AAROM;Both;10 reps;Supine Elbow Flexion: AAROM;Both;10 reps;Supine Elbow Extension: Both;10 reps;AAROM Wrist Flexion: AAROM;Supine;5 reps;Both Wrist Extension: 5 reps;Both;Supine;AAROM Digit Composite Flexion: AAROM;Both;5 reps;Supine Composite Extension: Supine;AAROM;Both;5 reps Other Exercises Other Exercises: Pt engaged in above exercieses as well as brief, gentle, retrograde massage on BUE. Pt also educated on positioning for his RUE to maximize skin integrity and promote comfot this date.   Shoulder Instructions       General Comments      Pertinent Vitals/ Pain       Faces Pain Scale: Hurts a  little bit Pain Location: Pt denis pain at rest. States pain increases through RUE at wrist with PROM this date. Pain Descriptors / Indicators: Discomfort;Sore Pain Intervention(s): Limited activity within patient's tolerance;Monitored during session  Home Living                                          Prior Functioning/Environment              Frequency  Min 1X/week        Progress Toward Goals  OT Goals(current goals can now be found in the care plan section)  Progress towards OT goals: Progressing toward goals  Acute Rehab OT Goals Patient Stated Goal: to get better, return to PLOF OT Goal Formulation: With patient Time For Goal Achievement: 12/10/18 Potential to Achieve Goals: Good  Plan Discharge plan remains appropriate;Frequency remains appropriate    Co-evaluation                 AM-PAC OT "6 Clicks" Daily Activity     Outcome Measure   Help from another person eating meals?: A Lot Help from another person taking care of personal grooming?: A Lot Help from another person toileting, which includes using toliet, bedpan, or urinal?: Total Help from another person bathing (including washing, rinsing, drying)?: Total Help from another person to put on and taking off regular upper body clothing?: A Lot Help from another person to put on and taking off regular lower body clothing?: A Lot 6 Click Score: 10    End of Session    OT Visit Diagnosis: Other abnormalities of gait and mobility (R26.89);Muscle weakness (generalized) (M62.81);Pain Pain - Right/Left: Right Pain - part of body: Arm;Hand;Knee   Activity Tolerance Patient tolerated treatment well   Patient Left in bed;with call bell/phone within reach;with bed alarm set   Nurse Communication          Time: [id number] OT Time Calculation (min): 11 min  Charges: OT General Charges $OT Visit: 1 Visit OT Treatments $Self Care/Home Management : 8-22 mins  Shara Blazing, M.S., OTR/L Ascom: [phone] 11/28/18, 3:35 PM

## 2018-11-28 NOTE — Progress Notes (Signed)
Contacted Bobby Lane at [facility] at [phone]. She reports that she is waiting for insurance approval. Provided CM # to f/u.

## 2018-11-29 LAB — BASIC METABOLIC PANEL
Anion gap: 8 (ref 5–15)
BUN: 21 mg/dL (ref 8–23)
CO2: 25 mmol/L (ref 22–32)
Calcium: 8.2 mg/dL — ABNORMAL LOW (ref 8.9–10.3)
Chloride: 104 mmol/L (ref 98–111)
Creatinine, Ser: 0.98 mg/dL (ref 0.61–1.24)
GFR calc Af Amer: >60 mL/min (ref >60)
GFR calc non Af Amer: >60 mL/min (ref >60)
Glucose, Bld: 91 mg/dL (ref 70–99)
Potassium: 3.7 mmol/L (ref 3.5–5.1)
Sodium: 137 mmol/L (ref 135–145)

## 2018-11-29 LAB — CBC
HCT: 33 % — ABNORMAL LOW (ref 39.0–52.0)
Hemoglobin: 10.9 g/dL — ABNORMAL LOW (ref 13.0–17.0)
MCH: 34.1 pg — ABNORMAL HIGH (ref 26.0–34.0)
MCHC: 33 g/dL (ref 30.0–36.0)
MCV: 103.1 fL — ABNORMAL HIGH (ref 80.0–100.0)
Platelets: 269 10*3/uL (ref 150–400)
RBC: 3.2 MIL/uL — ABNORMAL LOW (ref 4.22–5.81)
RDW: 14.5 % (ref 11.5–15.5)
WBC: 8.8 10*3/uL (ref 4.0–10.5)
nRBC: 0 % (ref 0.0–0.2)

## 2018-11-29 MED ORDER — ZINC OXIDE 40 % EX OINT
TOPICAL_OINTMENT | CUTANEOUS | Status: DC | PRN
  Administered 2018-11-29 – 2018-12-01 (×2): via TOPICAL
  Filled 2018-11-29 (×4): qty 57

## 2018-11-29 NOTE — Progress Notes (Signed)
[place] at [facility]                                                                                                                                                                                  Patient Demographics   Bobby Lane, is a 78 y.o. male, DOB - 1940-06-09, KH:[id number]  Admit date - 11/19/2018   Admitting Physician Harrie Foreman, MD  Outpatient Primary MD for the patient is Danelle Berry, NP   LOS - 10  Subjective: Patient denies any complaints    Review of Systems:   CONSTITUTIONAL: No documented fever. No fatigue, weakness. No weight gain, no weight loss.  EYES: No blurry or double vision.  ENT: No tinnitus. No postnasal drip. No redness of the oropharynx.  RESPIRATORY: No cough, no wheeze, no hemoptysis. No dyspnea.  CARDIOVASCULAR: No chest pain. No orthopnea. No palpitations. No syncope.  GASTROINTESTINAL: No nausea, no vomiting or diarrhea. No abdominal pain. No melena or hematochezia.  GENITOURINARY: No dysuria or hematuria.  ENDOCRINE: No polyuria or nocturia. No heat or cold intolerance.  HEMATOLOGY: No anemia. No bruising. No bleeding.  INTEGUMENTARY: No rashes. No lesions.  MUSCULOSKELETAL: No arthritis. No swelling. No gout.  NEUROLOGIC: No numbness, tingling, or ataxia. No seizure-type activity.  PSYCHIATRIC: No anxiety. No insomnia. No ADD.    Vitals:   Vitals:   11/28/18 1950 11/29/18 0500 11/29/18 0510 11/29/18 1000  BP: (!) 115/59  100/86 (!) 119/58  Pulse: 78  77 85  Resp: 18  19   Temp: 98.2 F (36.8 C)  98 F (36.7 C) 97.7 F (36.5 C)  TempSrc: Oral  Oral Oral  SpO2: 95%  93% 95%  Weight:  95.1 kg    Height:        Wt Readings from Last 3 Encounters:  11/29/18 95.1 kg  11/13/18 92.5 kg  04/10/18 100.7 kg     Intake/Output Summary (Last 24 hours) at 11/29/2018 1314 Last data filed at 11/29/2018 1015 Gross per 24 hour  Intake -  Output 2600 ml  Net -2600 ml    Physical Exam:    GENERAL: Pleasant-appearing in no apparent distress.  HEAD, EYES, EARS, NOSE AND THROAT: Atraumatic, normocephalic. Extraocular muscles are intact. Pupils equal and reactive to light. Sclerae anicteric. No conjunctival injection. No oro-pharyngeal erythema.  NECK: Supple. There is no jugular venous distention. No bruits, no lymphadenopathy, no thyromegaly.  HEART: Regular rate and rhythm,. No murmurs, no rubs, no clicks.  LUNGS: Clear to auscultation bilaterally. No rales or rhonchi. No wheezes.  ABDOMEN: Soft, flat, nontender, nondistended. Has good bowel sounds. No hepatosplenomegaly appreciated.  EXTREMITIES: No evidence of any cyanosis,  clubbing, or peripheral edema.  +2 pedal and radial pulses bilaterally.  NEUROLOGIC: The patient is alert, awake, and oriented x3 with no focal motor or sensory deficits appreciated bilaterally.  SKIN: Moist and warm with no rashes appreciated.  Psych: Not anxious, depressed LN: No inguinal LN enlargement    Antibiotics   Anti-infectives (From admission, onward)   Start     Dose/Rate Route Frequency Ordered Stop   11/27/18 1200  vancomycin (VANCOCIN) 50 mg/mL oral solution 125 mg     125 mg Oral Every 6 hours 11/27/18 1050 12/05/18 1159   11/22/18 1200  vancomycin (VANCOCIN) 500 mg in sodium chloride irrigation 0.9 % 100 mL ENEMA  Status:  Discontinued     500 mg Rectal Every 6 hours 11/22/18 1147 11/25/18 1607   11/21/18 2000  metroNIDAZOLE (FLAGYL) IVPB 500 mg     500 mg 100 mL/hr over 60 Minutes Intravenous Every 8 hours 11/21/18 1816 11/27/18 1743   11/21/18 1400  metroNIDAZOLE (FLAGYL) IVPB 500 mg  Status:  Discontinued     500 mg 100 mL/hr over 60 Minutes Intravenous Every 8 hours 11/21/18 1347 11/21/18 1618   11/21/18 1400  vancomycin (VANCOCIN) 50 mg/mL oral solution 500 mg  Status:  Discontinued     500 mg Oral Every 6 hours 11/21/18 1347 11/21/18 1357   11/21/18 1400  metroNIDAZOLE (FLAGYL) IVPB 500 mg  Status:  Discontinued     500  mg 100 mL/hr over 60 Minutes Intravenous Every 8 hours 11/21/18 1347 11/21/18 1347   11/21/18 1400  vancomycin (VANCOCIN) 50 mg/mL oral solution 500 mg  Status:  Discontinued     500 mg Oral Every 6 hours 11/21/18 1358 11/27/18 1050   11/20/18 2000  vancomycin (VANCOCIN) IVPB 1000 mg/200 mL premix  Status:  Discontinued     1,000 mg 200 mL/hr over 60 Minutes Intravenous Every 24 hours 11/20/18 1448 11/21/18 0746   11/20/18 1600  metroNIDAZOLE (FLAGYL) IVPB 500 mg  Status:  Discontinued     500 mg 100 mL/hr over 60 Minutes Intravenous Every 8 hours 11/20/18 1507 11/20/18 1626   11/20/18 1515  piperacillin-tazobactam (ZOSYN) IVPB 3.375 g  Status:  Discontinued     3.375 g 12.5 mL/hr over 240 Minutes Intravenous Every 8 hours 11/20/18 1507 11/21/18 1730   11/19/18 1930  vancomycin (VANCOCIN) 1,250 mg in sodium chloride 0.9 % 250 mL IVPB     1,250 mg 166.7 mL/hr over 90 Minutes Intravenous  Once 11/19/18 1901 11/19/18 2211   11/19/18 1800  vancomycin (VANCOCIN) 1,250 mg in sodium chloride 0.9 % 250 mL IVPB  Status:  Discontinued     1,250 mg 166.7 mL/hr over 90 Minutes Intravenous  Once 11/19/18 1734 11/19/18 1901   11/19/18 1600  ceFEPIme (MAXIPIME) 2 g in sodium chloride 0.9 % 100 mL IVPB  Status:  Discontinued     2 g 200 mL/hr over 30 Minutes Intravenous Every 12 hours 11/19/18 0634 11/20/18 1507   11/19/18 1400  metroNIDAZOLE (FLAGYL) IVPB 500 mg  Status:  Discontinued     500 mg 100 mL/hr over 60 Minutes Intravenous Every 8 hours 11/19/18 0634 11/20/18 0924   11/19/18 1204  vancomycin variable dose per unstable renal function (pharmacist dosing)  Status:  Discontinued      Does not apply See admin instructions 11/19/18 1204 11/20/18 1447   11/19/18 0415  vancomycin (VANCOCIN) 2,000 mg in sodium chloride 0.9 % 500 mL IVPB     2,000 mg 250 mL/hr over 120  Minutes Intravenous  Once 11/19/18 0405 11/19/18 0733   11/19/18 0400  ceFEPIme (MAXIPIME) 2 g in sodium chloride 0.9 % 100 mL IVPB      2 g 200 mL/hr over 30 Minutes Intravenous  Once 11/19/18 0357 11/19/18 0511   11/19/18 0400  metroNIDAZOLE (FLAGYL) IVPB 500 mg     500 mg 100 mL/hr over 60 Minutes Intravenous  Once 11/19/18 0357 11/19/18 0548   11/19/18 0400  vancomycin (VANCOCIN) IVPB 1000 mg/200 mL premix  Status:  Discontinued     1,000 mg 200 mL/hr over 60 Minutes Intravenous  Once 11/19/18 0357 11/19/18 0405      Medications   Scheduled Meds: . allopurinol  300 mg Oral Daily  . Chlorhexidine Gluconate Cloth  6 each Topical Daily  . cloNIDine  0.1 mg Oral BID  . DULoxetine  30 mg Oral BID  . feeding supplement  1 Container Oral TID BM  . furosemide  40 mg Intravenous BID  . heparin  5,000 Units Subcutaneous Q8H  . levothyroxine  100 mcg Oral QAC breakfast  . losartan  100 mg Oral Daily  . metoprolol succinate  100 mg Oral Daily  . rosuvastatin  5 mg Oral q1800  . spironolactone  25 mg Oral Daily  . tamsulosin  0.4 mg Oral Daily  . terbinafine   Topical BID  . vancomycin  125 mg Oral Q6H   Continuous Infusions: PRN Meds:.acetaminophen **OR** acetaminophen, albuterol, ondansetron **OR** ondansetron (ZOFRAN) IV, phenol, sodium chloride   Data Review:   Micro Results Recent Results (from the past 240 hour(s))  C difficile quick scan w PCR reflex     Status: Abnormal   Collection Time: 11/21/18  6:26 PM   Specimen: STOOL  Result Value Ref Range Status   C Diff antigen POSITIVE (A) NEGATIVE Final   C Diff toxin NEGATIVE NEGATIVE Final   C Diff interpretation Results are indeterminate. See PCR results.  Final    Comment: Performed at [facility], [address] [place], [place] [address]  C. Diff by PCR, Reflexed     Status: Abnormal   Collection Time: 11/21/18  6:26 PM  Result Value Ref Range Status   Toxigenic C. Difficile by PCR POSITIVE (A) NEGATIVE Final    Comment: Positive for toxigenic C. difficile with little to no toxin production. Only treat if clinical presentation suggests  symptomatic illness. Performed at [facility], [address] [place], [place] [address]   GI pathogen panel by PCR, stool     Status: None   Collection Time: 11/21/18  9:46 PM   Specimen: Stool  Result Value Ref Range Status   Plesiomonas shigelloides NOT DETECTED NOT DETECTED Final   Yersinia enterocolitica NOT DETECTED NOT DETECTED Final   Vibrio NOT DETECTED NOT DETECTED Final   Enteropathogenic E coli NOT DETECTED NOT DETECTED Final   E coli (ETEC) LT/ST NOT DETECTED NOT DETECTED Final   E coli [id number] by PCR Not applicable NOT DETECTED Final   Cryptosporidium by PCR NOT DETECTED NOT DETECTED Final   Entamoeba histolytica NOT DETECTED NOT DETECTED Final   Adenovirus F 40/41 NOT DETECTED NOT DETECTED Final   Norovirus GI/GII NOT DETECTED NOT DETECTED Final   Sapovirus NOT DETECTED NOT DETECTED Final    Comment: (NOTE) Performed At: [facility] [place], [place] [id number] Rush Farmer MD [id number]    Vibrio cholerae NOT DETECTED NOT DETECTED Final   Campylobacter by PCR NOT DETECTED NOT DETECTED Final  Salmonella by PCR NOT DETECTED NOT DETECTED Final   E coli (STEC) NOT DETECTED NOT DETECTED Final   Enteroaggregative E coli NOT DETECTED NOT DETECTED Final   Shigella by PCR NOT DETECTED NOT DETECTED Final   Cyclospora cayetanensis NOT DETECTED NOT DETECTED Final   Astrovirus NOT DETECTED NOT DETECTED Final   G lamblia by PCR NOT DETECTED NOT DETECTED Final   Rotavirus A by PCR NOT DETECTED NOT DETECTED Final  Novel Coronavirus, NAA (Hosp order, Send-out to Ref Lab; TAT 18-24 hrs     Status: None   Collection Time: 11/27/18  4:48 PM   Specimen: Nasopharyngeal Swab; Respiratory  Result Value Ref Range Status   SARS-CoV-2, NAA NOT DETECTED NOT DETECTED Final    Comment: (NOTE) This nucleic acid amplification test was developed and its performance characteristics determined by [employer]. Nucleic acid amplification tests  include PCR and TMA. This test has not been FDA cleared or approved. This test has been authorized by FDA under an Emergency Use Authorization (EUA). This test is only authorized for the duration of time the declaration that circumstances exist justifying the authorization of the emergency use of in vitro diagnostic tests for detection of SARS-CoV-2 virus and/or diagnosis of COVID-19 infection under section 564(b)(1) of the Act, 21 U.S.C. [id number]) (1), unless the authorization is terminated or revoked sooner. When diagnostic testing is negative, the possibility of a false negative result should be considered in the context of a patient's recent exposures and the presence of clinical signs and symptoms consistent with COVID-19. An individual without symptoms of COVID- 19 and who is not shedding SARS-CoV-2 vi rus would expect to have a negative (not detected) result in this assay. Performed At: [facility] [address] [place], [place] [id number] Rush Farmer MD [id number]    [place]  Final    Comment: Performed at [facility], [address] [place], [place] [address]    Radiology Reports Ct Abdomen Pelvis Wo Contrast  Result Date: 11/20/2018 CLINICAL DATA:  Abdominal pain, sepsis, elevated lactic acid, leukocytosis EXAM: CT ABDOMEN AND PELVIS WITHOUT CONTRAST TECHNIQUE: Multidetector CT imaging of the abdomen and pelvis was performed following the standard protocol without IV contrast. COMPARISON:  09/03/2013 FINDINGS: Lower chest: Trace bilateral pleural effusions. Coronary artery calcifications. Hepatobiliary: Coarse contour of the liver. No gallstones, gallbladder wall thickening, or biliary dilatation. Pancreas: Unremarkable. No pancreatic ductal dilatation or surrounding inflammatory changes. Spleen: Normal in size without significant abnormality. Adrenals/Urinary Tract: Adrenal glands are unremarkable. Numerous tiny  nonobstructive bilateral renal calculi. No hydronephrosis. Simple cyst of the inferior pole of the right kidney. Bladder is decompressed by Foley catheter. Stomach/Bowel: Stomach is within normal limits. The appendix is not clearly visualized and may be surgically absent. The colon is diffusely distended with wall thickening and adjacent fat stranding, containing fluid to the rectum. Vascular/Lymphatic: Aortic atherosclerosis. No enlarged abdominal or pelvic lymph nodes. Reproductive: No mass or other significant abnormality. Other: Fat and fluid containing right inguinal hernia (series 5, image 38). Trace ascites. 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 IMPRESSION: 1. The colon is diffusely distended with wall thickening and adjacent fat stranding, containing fluid to the rectum. Findings are consistent with nonspecific infectious, inflammatory, or ischemic colitis. There are no particular findings to suggest ischemia such as pneumatosis or portal venous gas. 2.  Trace ascites in the abdomen and pelvis. 3. Coarse contour of the liver, suggesting cirrhosis, however without definitive morphologic stigmata such as splenomegaly or varices.  4.  Nonobstructive bilateral nephrolithiasis. 5.  Aortic Atherosclerosis (ICD10-I70.0). Electronically Signed   By: Eddie Candle M.D.   On: 11/20/2018 15:00   Dg Forearm Right  Result Date: 11/20/2018 CLINICAL DATA:  Right arm pain after fall. EXAM: RIGHT FOREARM - 2 VIEW COMPARISON:  Right forearm x-rays dated May 02, 2010. FINDINGS: There is no evidence of fracture or other focal bone lesions. Slight cortical irregularity of the radial neck is chronic and unchanged since 2012. Soft tissues are unremarkable. IMPRESSION: No acute osseous abnormality. Electronically Signed   By: Titus Dubin M.D.   On: 11/20/2018 15:12   Dg Wrist Complete Right  Result Date: 11/20/2018 CLINICAL DATA:  Wrist pain after fall. EXAM: RIGHT WRIST - COMPLETE 3+  VIEW COMPARISON:  Right forearm x-rays dated May 02, 2010. Right hand x-rays dated April 26, 2010. FINDINGS: There is no evidence of fracture or dislocation. There is no evidence of arthropathy or other focal bone abnormality. Soft tissues are unremarkable. 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 IMPRESSION: 1.  No acute osseous abnormality. Electronically Signed   By: Titus Dubin M.D.   On: 11/20/2018 15:09   Dg Abd 1 View  Result Date: 11/23/2018 CLINICAL DATA:  Severe cdiff colitis with colonic distension. Eval for ileus. Pt actively having episodes of diarrhea during the exam. EXAM: ABDOMEN - 1 VIEW COMPARISON:  Abdominal radiograph 11/22/2018 FINDINGS: There are dilated loops of small and large bowel throughout the abdomen likely representing diffuse ileus. The transverse colon is significantly dilated measuring 9 cm in diameter. Health stromal markings are maintained. There is evidence of thumbprinting likely from mucosal edema. No evidence for free air on supine view. No acute finding in the visualized skeleton. IMPRESSION: Findings consistent with diffuse ileus. No gross free air. 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 Electronically Signed   By: Audie Pinto M.D.   On: 11/23/2018 10:15   Dg Abd 1 View  Result Date: 11/22/2018 CLINICAL DATA:  Abdominal distension. EXAM: ABDOMEN - 1 VIEW COMPARISON:  May 21, 2017. FINDINGS: Dilated air-filled colon is noted. No definite small bowel dilatation is noted. No abnormal calcifications are noted. IMPRESSION: Dilated air-filled colon is noted most consistent with ileus. Electronically Signed   By: Marijo Conception M.D.   On: 11/22/2018 12:45   Ct Angio Chest Pe W/cm &/or Wo Cm  Result Date: 11/19/2018 CLINICAL DATA:  Hypoxia with new onset atrial fibrillation.  Fever. EXAM: CT ANGIOGRAPHY CHEST WITH CONTRAST TECHNIQUE:  Multidetector CT imaging of the chest was performed using the standard protocol during bolus administration of intravenous contrast. Multiplanar CT image reconstructions and MIPs were obtained to evaluate the vascular anatomy. CONTRAST:  4mL OMNIPAQUE IOHEXOL 350 MG/ML SOLN COMPARISON:  None. FINDINGS: Cardiovascular: Limited study primarily due to bolus dispersion and intermittent respiratory motion. No evidence of central or lobar pulmonary embolism. No acute aortic finding. There is multifocal atherosclerotic calcification of the aorta and coronaries. Normal heart size. No pericardial effusion Mediastinum/Nodes: Negative for adenopathy or mass Lungs/Pleura: There is no edema, consolidation, effusion, or pneumothorax. Mild dependent atelectasis. Upper Abdomen: Negative Musculoskeletal: Spondylosis and generalized degenerative disease. 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 Osteopenia Review of the MIP images confirms the above findings. IMPRESSION: 1. Very limited pulmonary artery evaluation. No evidence of main or lobar pulmonary embolism. 2. Aortic and coronary atherosclerosis. Electronically Signed   By: Monte Fantasia M.D.   On: 11/19/2018 06:08   Dg  Chest Port 1 View  Result Date: 11/19/2018 CLINICAL DATA:  Hypoxia EXAM: PORTABLE CHEST 1 VIEW COMPARISON:  Six days ago FINDINGS: Normal heart size and mediastinal contours. There is no edema, consolidation, effusion, or pneumothorax. No acute osseous finding. Severe glenohumeral osteoarthritis. IMPRESSION: No evidence of active disease. Electronically Signed   By: Monte Fantasia M.D.   On: 11/19/2018 04:50   Dg Chest Port 1 View  Result Date: 11/13/2018 CLINICAL DATA:  Fever. Weakness. EXAM: PORTABLE CHEST 1 VIEW COMPARISON:  Radiograph 11/12/2017 FINDINGS: The cardiomediastinal contours are normal. Atherosclerosis of the aortic arch. Subsegmental atelectasis at the bases. Pulmonary vasculature is normal. Questionable blunting of left  costophrenic angle. No pneumothorax. No acute osseous abnormalities are seen. IMPRESSION: 1. Subsegmental atelectasis at the bases. 2. Possible small left pleural effusion. Electronically Signed   By: Keith Rake M.D.   On: 11/13/2018 23:43     CBC Recent Labs  Lab 11/25/18 0645 11/26/18 0626 11/27/18 0517 11/28/18 0643 11/29/18 0558  WBC 13.2* 10.8* 11.1* 9.4 8.8  HGB 10.2* 10.5* 10.8* 10.9* 10.9*  HCT 31.3* 32.1* 33.1* 33.6* 33.0*  PLT 236 234 272 274 269  MCV 104.7* 103.5* 104.4* 103.1* 103.1*  MCH 34.1* 33.9 34.1* 33.4 34.1*  MCHC 32.6 32.7 32.6 32.4 33.0  RDW 14.4 13.8 14.3 14.2 14.5    Chemistries  Recent Labs  Lab 11/22/18 1559 11/23/18 0640  11/25/18 0645 11/26/18 0626 11/27/18 0517 11/27/18 0626 11/28/18 0643 11/29/18 0558  NA  --  136   < > 136 137 139  --  137 137  K  --  3.8   < > 3.3* 3.5 3.6  --  3.4* 3.7  CL  --  110   < > 110 111 110  --  107 104  CO2  --  20*   < > 19* 16* 21*  --  24 25  GLUCOSE  --  106*   < > 89 81 92  --  91 91  BUN  --  29*   < > 24* 20 17  --  19 21  CREATININE  --  0.99   < > 0.86 0.74 0.86  --  0.99 0.98  CALCIUM  --  7.7*   < > 7.6* 7.8* 7.9*  --  8.1* 8.2*  MG 2.0  --   --  2.1  --   --   --   --   --   AST  --  20  --   --   --   --  15  --   --   ALT  --  18  --   --   --   --  11  --   --   ALKPHOS  --  71  --   --   --   --  46  --   --   BILITOT  --  0.5  --   --   --   --  0.5  --   --    < > = values in this interval not displayed.   ------------------------------------------------------------------------------------------------------------------ estimated creatinine clearance is 70.5 mL/min (by C-G formula based on SCr of 0.98 mg/dL). ------------------------------------------------------------------------------------------------------------------ No results for input(s): HGBA1C in the last 72  hours. ------------------------------------------------------------------------------------------------------------------ No results for input(s): CHOL, HDL, LDLCALC, TRIG, CHOLHDL, LDLDIRECT in the last 72 hours. ------------------------------------------------------------------------------------------------------------------ No results for input(s): TSH, T4TOTAL, T3FREE, THYROIDAB in the last 72 hours.  Invalid input(s): FREET3 ------------------------------------------------------------------------------------------------------------------ No results for  input(s): VITAMINB12, FOLATE, FERRITIN, TIBC, IRON, RETICCTPCT in the last 72 hours.  Coagulation profile No results for input(s): INR, PROTIME in the last 168 hours.  No results for input(s): DDIMER in the last 72 hours.  Cardiac Enzymes No results for input(s): CKMB, TROPONINI, MYOGLOBIN in the last 168 hours.  Invalid input(s): CK ------------------------------------------------------------------------------------------------------------------ Invalid input(s): [place]  This is a 78 year old male admitted for sepsis.  *Sepsis: present on admission Likely due to severe C. difficile colitis-clinically improving - vanco to 125mg  PO Q6 for 9 more days -Tolerating soft diet, encourage ambulation, discontinue Foley  *Acute on chronic kidney injury 2: ARF Now resolved, renal function back to baseline  *Hypertension: Controlled; continue losartan, metoprolol, clonidine and Spironolactone   *BPH: Continue tamsulosin.   * Hypothyroidism: continue Synthroid, normal TSH  * Right upper extremity pain -resolved, Right wrist and forearm x-rays with no acute findings        Code Status Orders  (From admission, onward)         Start     Ordered   11/19/18 1248  Full code  Continuous     11/19/18 1247        Code Status History    This patient has a current code status but no historical  code status.   Advance Care Planning Activity           Consults none   DVT Prophylaxis Heparin  Lab Results  Component Value Date   PLT 269 11/29/2018     Time Spent in minutes 35 minutes  Greater than 50% of time spent in care coordination and counseling patient regarding the condition and plan of care.   Dustin Flock M.D on 11/29/2018 at 1:14 PM  Between 7am to 6pm - Pager - [phone]  After 6pm go to [email or web address] - Proofreader  [employer]   Office  [phone]

## 2018-11-29 NOTE — Progress Notes (Signed)
Pts BP 119/58, MD Posey Pronto notified. orders received to hold BP meds (clonidine and losartan).

## 2018-11-30 MED ORDER — SIMETHICONE 40 MG/0.6ML PO SUSP
40.0000 mg | Freq: Three times a day (TID) | ORAL | Status: DC
  Administered 2018-11-30 – 2018-12-04 (×11): 40 mg via ORAL
  Filled 2018-11-30: qty 0.6
  Filled 2018-11-30 (×3): qty 30
  Filled 2018-11-30 (×2): qty 0.6
  Filled 2018-11-30: qty 30
  Filled 2018-11-30: qty 0.6
  Filled 2018-11-30 (×5): qty 30
  Filled 2018-11-30 (×2): qty 0.6
  Filled 2018-11-30 (×4): qty 30
  Filled 2018-11-30: qty 0.6
  Filled 2018-11-30 (×4): qty 30

## 2018-11-30 MED ORDER — ALBUMIN HUMAN 25 % IV SOLN
25.0000 g | Freq: Once | INTRAVENOUS | Status: AC
  Administered 2018-11-30: 11:00:00 25 g via INTRAVENOUS
  Filled 2018-11-30: qty 100

## 2018-11-30 NOTE — Progress Notes (Signed)
[place] at [facility]                                                                                                                                                                                  Patient Demographics   Bobby Lane, is a 78 y.o. male, DOB - 1940-08-13, [id number]  Admit date - 11/19/2018   Admitting Physician Harrie Foreman, MD  Outpatient Primary MD for the patient is Danelle Berry, NP   LOS - 11  Subjective: Patient denies any complaints Except swelling of his upper extremity lower extremity   Review of Systems:   CONSTITUTIONAL: No documented fever. No fatigue, wea  kness. No weight gain, no weight loss.  EYES: No blurry or double vision.  ENT: No tinnitus. No postnasal drip. No redness of the oropharynx.  RESPIRATORY: No cough, no wheeze, no hemoptysis. No dyspnea.  CARDIOVASCULAR: No chest pain. No orthopnea. No palpitations. No syncope.  GASTROINTESTINAL: No nausea, no vomiting or diarrhea. No abdominal pain. No melena or hematochezia.  GENITOURINARY: No dysuria or hematuria.  ENDOCRINE: No polyuria or nocturia. No heat or cold intolerance.  HEMATOLOGY: No anemia. No bruising. No bleeding.  INTEGUMENTARY: No rashes. No lesions.  MUSCULOSKELETAL: No arthritis. No swelling. No gout.  NEUROLOGIC: No numbness, tingling, or ataxia. No seizure-type activity.  PSYCHIATRIC: No anxiety. No insomnia. No ADD.    Vitals:   Vitals:   11/29/18 1530 11/29/18 2009 11/30/18 0425 11/30/18 0500  BP: 105/60 107/75 122/65   Pulse: 80 79 83   Resp:  14 16   Temp: 98.2 F (36.8 C) 98.3 F (36.8 C) 97.8 F (36.6 C)   TempSrc: Oral Oral Oral   SpO2: 96% 96% 94%   Weight:    90.2 kg  Height:        Wt Readings from Last 3 Encounters:  11/30/18 90.2 kg  11/13/18 92.5 kg  04/10/18 100.7 kg     Intake/Output Summary (Last 24 hours) at 11/30/2018 1227 Last data filed at 11/30/2018 1201 Gross per 24 hour  Intake 40.13 ml   Output 3075 ml  Net -3034.87 ml    Physical Exam:   GENERAL: Pleasant-appearing in no apparent distress.  HEAD, EYES, EARS, NOSE AND THROAT: Atraumatic, normocephalic. Extraocular muscles are intact. Pupils equal and reactive to light. Sclerae anicteric. No conjunctival injection. No oro-pharyngeal erythema.  NECK: Supple. There is no jugular venous distention. No bruits, no lymphadenopathy, no thyromegaly.  HEART: Regular rate and rhythm,. No murmurs, no rubs, no clicks.  LUNGS: Clear to auscultation bilaterally. No rales or rhonchi. No wheezes.  ABDOMEN: Soft, flat, nontender, nondistended. Has good bowel sounds. No hepatosplenomegaly  appreciated.  EXTREMITIES: No evidence of any cyanosis, clubbing, or positive peripheral edema.  +2 pedal and radial pulses bilaterally.  NEUROLOGIC: The patient is alert, awake, and oriented x3 with no focal motor or sensory deficits appreciated bilaterally.  SKIN: Moist and warm with no rashes appreciated.  Psych: Not anxious, depressed LN: No inguinal LN enlargement    Antibiotics   Anti-infectives (From admission, onward)   Start     Dose/Rate Route Frequency Ordered Stop   11/27/18 1200  vancomycin (VANCOCIN) 50 mg/mL oral solution 125 mg     125 mg Oral Every 6 hours 11/27/18 1050 12/05/18 1159   11/22/18 1200  vancomycin (VANCOCIN) 500 mg in sodium chloride irrigation 0.9 % 100 mL ENEMA  Status:  Discontinued     500 mg Rectal Every 6 hours 11/22/18 1147 11/25/18 1607   11/21/18 2000  metroNIDAZOLE (FLAGYL) IVPB 500 mg     500 mg 100 mL/hr over 60 Minutes Intravenous Every 8 hours 11/21/18 1816 11/27/18 1743   11/21/18 1400  metroNIDAZOLE (FLAGYL) IVPB 500 mg  Status:  Discontinued     500 mg 100 mL/hr over 60 Minutes Intravenous Every 8 hours 11/21/18 1347 11/21/18 1618   11/21/18 1400  vancomycin (VANCOCIN) 50 mg/mL oral solution 500 mg  Status:  Discontinued     500 mg Oral Every 6 hours 11/21/18 1347 11/21/18 1357   11/21/18 1400   metroNIDAZOLE (FLAGYL) IVPB 500 mg  Status:  Discontinued     500 mg 100 mL/hr over 60 Minutes Intravenous Every 8 hours 11/21/18 1347 11/21/18 1347   11/21/18 1400  vancomycin (VANCOCIN) 50 mg/mL oral solution 500 mg  Status:  Discontinued     500 mg Oral Every 6 hours 11/21/18 1358 11/27/18 1050   11/20/18 2000  vancomycin (VANCOCIN) IVPB 1000 mg/200 mL premix  Status:  Discontinued     1,000 mg 200 mL/hr over 60 Minutes Intravenous Every 24 hours 11/20/18 1448 11/21/18 0746   11/20/18 1600  metroNIDAZOLE (FLAGYL) IVPB 500 mg  Status:  Discontinued     500 mg 100 mL/hr over 60 Minutes Intravenous Every 8 hours 11/20/18 1507 11/20/18 1626   11/20/18 1515  piperacillin-tazobactam (ZOSYN) IVPB 3.375 g  Status:  Discontinued     3.375 g 12.5 mL/hr over 240 Minutes Intravenous Every 8 hours 11/20/18 1507 11/21/18 1730   11/19/18 1930  vancomycin (VANCOCIN) 1,250 mg in sodium chloride 0.9 % 250 mL IVPB     1,250 mg 166.7 mL/hr over 90 Minutes Intravenous  Once 11/19/18 1901 11/19/18 2211   11/19/18 1800  vancomycin (VANCOCIN) 1,250 mg in sodium chloride 0.9 % 250 mL IVPB  Status:  Discontinued     1,250 mg 166.7 mL/hr over 90 Minutes Intravenous  Once 11/19/18 1734 11/19/18 1901   11/19/18 1600  ceFEPIme (MAXIPIME) 2 g in sodium chloride 0.9 % 100 mL IVPB  Status:  Discontinued     2 g 200 mL/hr over 30 Minutes Intravenous Every 12 hours 11/19/18 0634 11/20/18 1507   11/19/18 1400  metroNIDAZOLE (FLAGYL) IVPB 500 mg  Status:  Discontinued     500 mg 100 mL/hr over 60 Minutes Intravenous Every 8 hours 11/19/18 0634 11/20/18 0924   11/19/18 1204  vancomycin variable dose per unstable renal function (pharmacist dosing)  Status:  Discontinued      Does not apply See admin instructions 11/19/18 1204 11/20/18 1447   11/19/18 0415  vancomycin (VANCOCIN) 2,000 mg in sodium chloride 0.9 % 500 mL IVPB  2,000 mg 250 mL/hr over 120 Minutes Intravenous  Once 11/19/18 0405 11/19/18 0733   11/19/18  0400  ceFEPIme (MAXIPIME) 2 g in sodium chloride 0.9 % 100 mL IVPB     2 g 200 mL/hr over 30 Minutes Intravenous  Once 11/19/18 0357 11/19/18 0511   11/19/18 0400  metroNIDAZOLE (FLAGYL) IVPB 500 mg     500 mg 100 mL/hr over 60 Minutes Intravenous  Once 11/19/18 0357 11/19/18 0548   11/19/18 0400  vancomycin (VANCOCIN) IVPB 1000 mg/200 mL premix  Status:  Discontinued     1,000 mg 200 mL/hr over 60 Minutes Intravenous  Once 11/19/18 0357 11/19/18 0405      Medications   Scheduled Meds: . allopurinol  300 mg Oral Daily  . Chlorhexidine Gluconate Cloth  6 each Topical Daily  . cloNIDine  0.1 mg Oral BID  . DULoxetine  30 mg Oral BID  . feeding supplement  1 Container Oral TID BM  . furosemide  40 mg Intravenous BID  . heparin  5,000 Units Subcutaneous Q8H  . levothyroxine  100 mcg Oral QAC breakfast  . losartan  100 mg Oral Daily  . metoprolol succinate  100 mg Oral Daily  . rosuvastatin  5 mg Oral q1800  . simethicone  40 mg Oral TID  . spironolactone  25 mg Oral Daily  . tamsulosin  0.4 mg Oral Daily  . terbinafine   Topical BID  . vancomycin  125 mg Oral Q6H   Continuous Infusions: PRN Meds:.acetaminophen **OR** acetaminophen, albuterol, liver oil-zinc oxide, ondansetron **OR** ondansetron (ZOFRAN) IV, phenol, sodium chloride   Data Review:   Micro Results Recent Results (from the past 240 hour(s))  C difficile quick scan w PCR reflex     Status: Abnormal   Collection Time: 11/21/18  6:26 PM   Specimen: STOOL  Result Value Ref Range Status   C Diff antigen POSITIVE (A) NEGATIVE Final   C Diff toxin NEGATIVE NEGATIVE Final   C Diff interpretation Results are indeterminate. See PCR results.  Final    Comment: Performed at [facility], [address] [place], [place] [address]  C. Diff by PCR, Reflexed     Status: Abnormal   Collection Time: 11/21/18  6:26 PM  Result Value Ref Range Status   Toxigenic C. Difficile by PCR POSITIVE (A) NEGATIVE Final     Comment: Positive for toxigenic C. difficile with little to no toxin production. Only treat if clinical presentation suggests symptomatic illness. Performed at [facility], [address] [place], [place] [address]   GI pathogen panel by PCR, stool     Status: None   Collection Time: 11/21/18  9:46 PM   Specimen: Stool  Result Value Ref Range Status   Plesiomonas shigelloides NOT DETECTED NOT DETECTED Final   Yersinia enterocolitica NOT DETECTED NOT DETECTED Final   Vibrio NOT DETECTED NOT DETECTED Final   Enteropathogenic E coli NOT DETECTED NOT DETECTED Final   E coli (ETEC) LT/ST NOT DETECTED NOT DETECTED Final   E coli [id number] by PCR Not applicable NOT DETECTED Final   Cryptosporidium by PCR NOT DETECTED NOT DETECTED Final   Entamoeba histolytica NOT DETECTED NOT DETECTED Final   Adenovirus F 40/41 NOT DETECTED NOT DETECTED Final   Norovirus GI/GII NOT DETECTED NOT DETECTED Final   Sapovirus NOT DETECTED NOT DETECTED Final    Comment: (NOTE) Performed At: [facility] [address] [place], [place] [id number] Rush Farmer MD [id number]  Vibrio cholerae NOT DETECTED NOT DETECTED Final   Campylobacter by PCR NOT DETECTED NOT DETECTED Final   Salmonella by PCR NOT DETECTED NOT DETECTED Final   E coli (STEC) NOT DETECTED NOT DETECTED Final   Enteroaggregative E coli NOT DETECTED NOT DETECTED Final   Shigella by PCR NOT DETECTED NOT DETECTED Final   Cyclospora cayetanensis NOT DETECTED NOT DETECTED Final   Astrovirus NOT DETECTED NOT DETECTED Final   G lamblia by PCR NOT DETECTED NOT DETECTED Final   Rotavirus A by PCR NOT DETECTED NOT DETECTED Final  Novel Coronavirus, NAA (Hosp order, Send-out to Ref Lab; TAT 18-24 hrs     Status: None   Collection Time: 11/27/18  4:48 PM   Specimen: Nasopharyngeal Swab; Respiratory  Result Value Ref Range Status   SARS-CoV-2, NAA NOT DETECTED NOT DETECTED Final    Comment: (NOTE) This nucleic acid amplification test  was developed and its performance characteristics determined by [employer]. Nucleic acid amplification tests include PCR and TMA. This test has not been FDA cleared or approved. This test has been authorized by FDA under an Emergency Use Authorization (EUA). This test is only authorized for the duration of time the declaration that circumstances exist justifying the authorization of the emergency use of in vitro diagnostic tests for detection of SARS-CoV-2 virus and/or diagnosis of COVID-19 infection under section 564(b)(1) of the Act, 21 U.S.C. [id number]) (1), unless the authorization is terminated or revoked sooner. When diagnostic testing is negative, the possibility of a false negative result should be considered in the context of a patient's recent exposures and the presence of clinical signs and symptoms consistent with COVID-19. An individual without symptoms of COVID- 19 and who is not shedding SARS-CoV-2 vi rus would expect to have a negative (not detected) result in this assay. Performed At: [facility] [address] [place], [place] [id number] Rush Farmer MD [id number]    [place]  Final    Comment: Performed at [facility], [address] [place], [place] [address]    Radiology Reports Ct Abdomen Pelvis Wo Contrast  Result Date: 11/20/2018 CLINICAL DATA:  Abdominal pain, sepsis, elevated lactic acid, leukocytosis EXAM: CT ABDOMEN AND PELVIS WITHOUT CONTRAST TECHNIQUE: Multidetector CT imaging of the abdomen and pelvis was performed following the standard protocol without IV contrast. COMPARISON:  09/03/2013 FINDINGS: Lower chest: Trace bilateral pleural effusions. Coronary artery calcifications. Hepatobiliary: Coarse contour of the liver. No gallstones, gallbladder wall thickening, or biliary dilatation. Pancreas: Unremarkable. No pancreatic ductal dilatation or surrounding inflammatory changes. Spleen: Normal in  size without significant abnormality. Adrenals/Urinary Tract: Adrenal glands are unremarkable. Numerous tiny nonobstructive bilateral renal calculi. No hydronephrosis. Simple cyst of the inferior pole of the right kidney. Bladder is decompressed by Foley catheter. Stomach/Bowel: Stomach is within normal limits. The appendix is not clearly visualized and may be surgically absent. The colon is diffusely distended with wall thickening and adjacent fat stranding, containing fluid to the rectum. Vascular/Lymphatic: Aortic atherosclerosis. No enlarged abdominal or pelvic lymph nodes. Reproductive: No mass or other significant abnormality. Other: Fat and fluid containing right inguinal hernia (series 5, image 38). Trace ascites. 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 IMPRESSION: 1. The colon is diffusely distended with wall thickening and adjacent fat stranding, containing fluid to the rectum. Findings are consistent with nonspecific infectious, inflammatory, or ischemic colitis. There are no particular findings to suggest ischemia such as pneumatosis or portal venous gas. 2.  Trace ascites in the abdomen and  pelvis. 3. Coarse contour of the liver, suggesting cirrhosis, however without definitive morphologic stigmata such as splenomegaly or varices. 4.  Nonobstructive bilateral nephrolithiasis. 5.  Aortic Atherosclerosis (ICD10-I70.0). Electronically Signed   By: Eddie Candle M.D.   On: 11/20/2018 15:00   Dg Forearm Right  Result Date: 11/20/2018 CLINICAL DATA:  Right arm pain after fall. EXAM: RIGHT FOREARM - 2 VIEW COMPARISON:  Right forearm x-rays dated May 02, 2010. FINDINGS: There is no evidence of fracture or other focal bone lesions. Slight cortical irregularity of the radial neck is chronic and unchanged since 2012. Soft tissues are unremarkable. IMPRESSION: No acute osseous abnormality. Electronically Signed   By: Titus Dubin M.D.   On: 11/20/2018 15:12   Dg Wrist  Complete Right  Result Date: 11/20/2018 CLINICAL DATA:  Wrist pain after fall. EXAM: RIGHT WRIST - COMPLETE 3+ VIEW COMPARISON:  Right forearm x-rays dated May 02, 2010. Right hand x-rays dated April 26, 2010. FINDINGS: There is no evidence of fracture or dislocation. There is no evidence of arthropathy or other focal bone abnormality. Soft tissues are unremarkable. 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 IMPRESSION: 1.  No acute osseous abnormality. Electronically Signed   By: Titus Dubin M.D.   On: 11/20/2018 15:09   Dg Abd 1 View  Result Date: 11/23/2018 CLINICAL DATA:  Severe cdiff colitis with colonic distension. Eval for ileus. Pt actively having episodes of diarrhea during the exam. EXAM: ABDOMEN - 1 VIEW COMPARISON:  Abdominal radiograph 11/22/2018 FINDINGS: There are dilated loops of small and large bowel throughout the abdomen likely representing diffuse ileus. The transverse colon is significantly dilated measuring 9 cm in diameter. Health stromal markings are maintained. There is evidence of thumbprinting likely from mucosal edema. No evidence for free air on supine view. No acute finding in the visualized skeleton. IMPRESSION: Findings consistent with diffuse ileus. No gross free air. 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 Electronically Signed   By: Audie Pinto M.D.   On: 11/23/2018 10:15   Dg Abd 1 View  Result Date: 11/22/2018 CLINICAL DATA:  Abdominal distension. EXAM: ABDOMEN - 1 VIEW COMPARISON:  May 21, 2017. FINDINGS: Dilated air-filled colon is noted. No definite small bowel dilatation is noted. No abnormal calcifications are noted. IMPRESSION: Dilated air-filled colon is noted most consistent with ileus. Electronically Signed   By: Marijo Conception M.D.   On: 11/22/2018 12:45   Ct Angio Chest Pe W/cm &/or Wo Cm  Result Date: 11/19/2018 CLINICAL  DATA:  Hypoxia with new onset atrial fibrillation.  Fever. EXAM: CT ANGIOGRAPHY CHEST WITH CONTRAST TECHNIQUE: Multidetector CT imaging of the chest was performed using the standard protocol during bolus administration of intravenous contrast. Multiplanar CT image reconstructions and MIPs were obtained to evaluate the vascular anatomy. CONTRAST:  13mL OMNIPAQUE IOHEXOL 350 MG/ML SOLN COMPARISON:  None. FINDINGS: Cardiovascular: Limited study primarily due to bolus dispersion and intermittent respiratory motion. No evidence of central or lobar pulmonary embolism. No acute aortic finding. There is multifocal atherosclerotic calcification of the aorta and coronaries. Normal heart size. No pericardial effusion Mediastinum/Nodes: Negative for adenopathy or mass Lungs/Pleura: There is no edema, consolidation, effusion, or pneumothorax. Mild dependent atelectasis. Upper Abdomen: Negative Musculoskeletal: Spondylosis and generalized degenerative disease. 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 Osteopenia Review of the MIP images confirms the above findings. IMPRESSION: 1. Very limited pulmonary artery evaluation. No evidence of main or lobar pulmonary embolism. 2. Aortic and  coronary atherosclerosis. Electronically Signed   By: Monte Fantasia M.D.   On: 11/19/2018 06:08   Dg Chest Port 1 View  Result Date: 11/19/2018 CLINICAL DATA:  Hypoxia EXAM: PORTABLE CHEST 1 VIEW COMPARISON:  Six days ago FINDINGS: Normal heart size and mediastinal contours. There is no edema, consolidation, effusion, or pneumothorax. No acute osseous finding. Severe glenohumeral osteoarthritis. IMPRESSION: No evidence of active disease. Electronically Signed   By: Monte Fantasia M.D.   On: 11/19/2018 04:50   Dg Chest Port 1 View  Result Date: 11/13/2018 CLINICAL DATA:  Fever. Weakness. EXAM: PORTABLE CHEST 1 VIEW COMPARISON:  Radiograph 11/12/2017 FINDINGS: The cardiomediastinal contours are normal. Atherosclerosis of the aortic  arch. Subsegmental atelectasis at the bases. Pulmonary vasculature is normal. Questionable blunting of left costophrenic angle. No pneumothorax. No acute osseous abnormalities are seen. IMPRESSION: 1. Subsegmental atelectasis at the bases. 2. Possible small left pleural effusion. Electronically Signed   By: Keith Rake M.D.   On: 11/13/2018 23:43     CBC Recent Labs  Lab 11/25/18 0645 11/26/18 0626 11/27/18 0517 11/28/18 0643 11/29/18 0558  WBC 13.2* 10.8* 11.1* 9.4 8.8  HGB 10.2* 10.5* 10.8* 10.9* 10.9*  HCT 31.3* 32.1* 33.1* 33.6* 33.0*  PLT 236 234 272 274 269  MCV 104.7* 103.5* 104.4* 103.1* 103.1*  MCH 34.1* 33.9 34.1* 33.4 34.1*  MCHC 32.6 32.7 32.6 32.4 33.0  RDW 14.4 13.8 14.3 14.2 14.5    Chemistries  Recent Labs  Lab 11/25/18 0645 11/26/18 0626 11/27/18 0517 11/27/18 0626 11/28/18 0643 11/29/18 0558  NA 136 137 139  --  137 137  K 3.3* 3.5 3.6  --  3.4* 3.7  CL 110 111 110  --  107 104  CO2 19* 16* 21*  --  24 25  GLUCOSE 89 81 92  --  91 91  BUN 24* 20 17  --  19 21  CREATININE 0.86 0.74 0.86  --  0.99 0.98  CALCIUM 7.6* 7.8* 7.9*  --  8.1* 8.2*  MG 2.1  --   --   --   --   --   AST  --   --   --  15  --   --   ALT  --   --   --  11  --   --   ALKPHOS  --   --   --  46  --   --   BILITOT  --   --   --  0.5  --   --    ------------------------------------------------------------------------------------------------------------------ estimated creatinine clearance is 68.7 mL/min (by C-G formula based on SCr of 0.98 mg/dL). ------------------------------------------------------------------------------------------------------------------ No results for input(s): HGBA1C in the last 72 hours. ------------------------------------------------------------------------------------------------------------------ No results for input(s): CHOL, HDL, LDLCALC, TRIG, CHOLHDL, LDLDIRECT in the last 72  hours. ------------------------------------------------------------------------------------------------------------------ No results for input(s): TSH, T4TOTAL, T3FREE, THYROIDAB in the last 72 hours.  Invalid input(s): FREET3 ------------------------------------------------------------------------------------------------------------------ No results for input(s): VITAMINB12, FOLATE, FERRITIN, TIBC, IRON, RETICCTPCT in the last 72 hours.  Coagulation profile No results for input(s): INR, PROTIME in the last 168 hours.  No results for input(s): DDIMER in the last 72 hours.  Cardiac Enzymes No results for input(s): CKMB, TROPONINI, MYOGLOBIN in the last 168 hours.  Invalid input(s): CK ------------------------------------------------------------------------------------------------------------------ Invalid input(s): [place]  This is a 78 year old male admitted for sepsis.  *Sepsis: present on admission  due to severe C. difficile colitis-clinically improving - vanco to 125mg  PO Q6 for 9  more days -Tolerating soft diet, encourage ambulation,   *Generalized anasarca this is due to severe hypoalbuminemia I will give him albumin continue IV Lasix  *Acute on chronic kidney injury 2: ARF Now resolved, renal function back to baseline  *Hypertension: Controlled; continue losartan, metoprolol, clonidine and Spironolactone   *BPH: Continue tamsulosin.   * Hypothyroidism: continue Synthroid, normal TSH  * Right upper extremity pain -resolved, Right wrist and forearm x-rays with no acute findings        Code Status Orders  (From admission, onward)         Start     Ordered   11/19/18 1248  Full code  Continuous     11/19/18 1247        Code Status History    This patient has a current code status but no historical code status.   Advance Care Planning Activity           Consults none   DVT Prophylaxis Heparin  Lab Results   Component Value Date   PLT 269 11/29/2018     Time Spent in minutes 35 minutes  Greater than 50% of time spent in care coordination and counseling patient regarding the condition and plan of care.   Dustin Flock M.D on 11/30/2018 at 12:27 PM  Between 7am to 6pm - Pager - [phone]  After 6pm go to [email or web address] - Proofreader  [employer]   Office  [phone]

## 2018-11-30 NOTE — Progress Notes (Signed)
Patient resting in bed, no complaints. Cream applied to right lower extremity to ringworm area. Bed low, alarm on, call bell in reach. Enteric precautions maintained. Condom cath in place and patent. Continue to monitor.

## 2018-11-30 NOTE — Progress Notes (Signed)
Advanced care plan.  Purpose of the Encounter: CODE STATUS  Parties in Attendance: Patient himself  Patient's Decision Capacity: Intact  Subjective/Patient's story: Patient 78 year old with hypothyroidism, essential hypertension, gout, GERD, coronary artery disease and COPD who is admitted with sepsis   Objective/Medical story I discussed with the patient regarding his desires for cardiac and pulmonary resuscitation   Goals of care determination:  Patient states that he would like to be a full code   CODE STATUS: Full code   Time spent discussing advanced care planning: 16 minutes

## 2018-12-01 MED ORDER — ACETAMINOPHEN 650 MG RE SUPP: 650.0000 mg | Freq: Three times a day (TID) | RECTAL | Status: DC

## 2018-12-01 MED ORDER — ALBUMIN HUMAN 25 % IV SOLN
12.5000 g | Freq: Once | INTRAVENOUS | Status: AC
  Administered 2018-12-01: 10:00:00 12.5 g via INTRAVENOUS
  Filled 2018-12-01: qty 50

## 2018-12-01 MED ORDER — ACETAMINOPHEN 325 MG PO TABS
650.0000 mg | ORAL_TABLET | Freq: Three times a day (TID) | ORAL | Status: DC
  Administered 2018-12-01 – 2018-12-04 (×9): 650 mg via ORAL
  Filled 2018-12-01 (×9): qty 2

## 2018-12-01 NOTE — Progress Notes (Signed)
[place] at [facility]                                                                                                                                                                                  Patient Demographics   Bobby Lane, is a 78 y.o. male, DOB - 1940-10-14, [id number]  Admit date - 11/19/2018   Admitting Physician Harrie Foreman, MD  Outpatient Primary MD for the patient is Danelle Berry, NP   LOS - 12  Subjective: Complains of some bloating in the abdomen no significant pain swelling has improved with IV albumin  Review of Systems:   CONSTITUTIONAL: No documented fever. No fatigue, wea  kness. No weight gain, no weight loss.  EYES: No blurry or double vision.  ENT: No tinnitus. No postnasal drip. No redness of the oropharynx.  RESPIRATORY: No cough, no wheeze, no hemoptysis. No dyspnea.  CARDIOVASCULAR: No chest pain. No orthopnea. No palpitations. No syncope.  GASTROINTESTINAL: No nausea, no vomiting or diarrhea. No abdominal pain. No melena or hematochezia.  GENITOURINARY: No dysuria or hematuria.  ENDOCRINE: No polyuria or nocturia. No heat or cold intolerance.  HEMATOLOGY: No anemia. No bruising. No bleeding.  INTEGUMENTARY: No rashes. No lesions.  MUSCULOSKELETAL: No arthritis. No swelling. No gout.  NEUROLOGIC: No numbness, tingling, or ataxia. No seizure-type activity.  PSYCHIATRIC: No anxiety. No insomnia. No ADD.    Vitals:   Vitals:   11/30/18 0500 11/30/18 1526 11/30/18 2050 12/01/18 0540  BP:  117/64 129/70 124/68  Pulse:  81 79 81  Resp:  18    Temp:  98.3 F (36.8 C) 98.1 F (36.7 C) 98.5 F (36.9 C)  TempSrc:  Oral Oral Oral  SpO2:  94% 96% 93%  Weight: 90.2 kg     Height:        Wt Readings from Last 3 Encounters:  11/30/18 90.2 kg  11/13/18 92.5 kg  04/10/18 100.7 kg     Intake/Output Summary (Last 24 hours) at 12/01/2018 1009 Last data filed at 11/30/2018 1201 Gross per 24 hour  Intake  40.13 ml  Output 1150 ml  Net -1109.87 ml    Physical Exam:   GENERAL: Pleasant-appearing in no apparent distress.  HEAD, EYES, EARS, NOSE AND THROAT: Atraumatic, normocephalic. Extraocular muscles are intact. Pupils equal and reactive to light. Sclerae anicteric. No conjunctival injection. No oro-pharyngeal erythema.  NECK: Supple. There is no jugular venous distention. No bruits, no lymphadenopathy, no thyromegaly.  HEART: Regular rate and rhythm,. No murmurs, no rubs, no clicks.  LUNGS: Clear to auscultation bilaterally. No rales or rhonchi. No wheezes.  ABDOMEN: Soft, flat, nontender, nondistended. Has good bowel  sounds. No hepatosplenomegaly appreciated.  EXTREMITIES: No evidence of any cyanosis, clubbing, or positive peripheral edema.  +2 pedal and radial pulses bilaterally.  NEUROLOGIC: The patient is alert, awake, and oriented x3 with no focal motor or sensory deficits appreciated bilaterally.  SKIN: Moist and warm with no rashes appreciated.  Psych: Not anxious, depressed LN: No inguinal LN enlargement    Antibiotics   Anti-infectives (From admission, onward)   Start     Dose/Rate Route Frequency Ordered Stop   11/27/18 1200  vancomycin (VANCOCIN) 50 mg/mL oral solution 125 mg     125 mg Oral Every 6 hours 11/27/18 1050 12/05/18 1159   11/22/18 1200  vancomycin (VANCOCIN) 500 mg in sodium chloride irrigation 0.9 % 100 mL ENEMA  Status:  Discontinued     500 mg Rectal Every 6 hours 11/22/18 1147 11/25/18 1607   11/21/18 2000  metroNIDAZOLE (FLAGYL) IVPB 500 mg     500 mg 100 mL/hr over 60 Minutes Intravenous Every 8 hours 11/21/18 1816 11/27/18 1743   11/21/18 1400  metroNIDAZOLE (FLAGYL) IVPB 500 mg  Status:  Discontinued     500 mg 100 mL/hr over 60 Minutes Intravenous Every 8 hours 11/21/18 1347 11/21/18 1618   11/21/18 1400  vancomycin (VANCOCIN) 50 mg/mL oral solution 500 mg  Status:  Discontinued     500 mg Oral Every 6 hours 11/21/18 1347 11/21/18 1357   11/21/18  1400  metroNIDAZOLE (FLAGYL) IVPB 500 mg  Status:  Discontinued     500 mg 100 mL/hr over 60 Minutes Intravenous Every 8 hours 11/21/18 1347 11/21/18 1347   11/21/18 1400  vancomycin (VANCOCIN) 50 mg/mL oral solution 500 mg  Status:  Discontinued     500 mg Oral Every 6 hours 11/21/18 1358 11/27/18 1050   11/20/18 2000  vancomycin (VANCOCIN) IVPB 1000 mg/200 mL premix  Status:  Discontinued     1,000 mg 200 mL/hr over 60 Minutes Intravenous Every 24 hours 11/20/18 1448 11/21/18 0746   11/20/18 1600  metroNIDAZOLE (FLAGYL) IVPB 500 mg  Status:  Discontinued     500 mg 100 mL/hr over 60 Minutes Intravenous Every 8 hours 11/20/18 1507 11/20/18 1626   11/20/18 1515  piperacillin-tazobactam (ZOSYN) IVPB 3.375 g  Status:  Discontinued     3.375 g 12.5 mL/hr over 240 Minutes Intravenous Every 8 hours 11/20/18 1507 11/21/18 1730   11/19/18 1930  vancomycin (VANCOCIN) 1,250 mg in sodium chloride 0.9 % 250 mL IVPB     1,250 mg 166.7 mL/hr over 90 Minutes Intravenous  Once 11/19/18 1901 11/19/18 2211   11/19/18 1800  vancomycin (VANCOCIN) 1,250 mg in sodium chloride 0.9 % 250 mL IVPB  Status:  Discontinued     1,250 mg 166.7 mL/hr over 90 Minutes Intravenous  Once 11/19/18 1734 11/19/18 1901   11/19/18 1600  ceFEPIme (MAXIPIME) 2 g in sodium chloride 0.9 % 100 mL IVPB  Status:  Discontinued     2 g 200 mL/hr over 30 Minutes Intravenous Every 12 hours 11/19/18 0634 11/20/18 1507   11/19/18 1400  metroNIDAZOLE (FLAGYL) IVPB 500 mg  Status:  Discontinued     500 mg 100 mL/hr over 60 Minutes Intravenous Every 8 hours 11/19/18 0634 11/20/18 0924   11/19/18 1204  vancomycin variable dose per unstable renal function (pharmacist dosing)  Status:  Discontinued      Does not apply See admin instructions 11/19/18 1204 11/20/18 1447   11/19/18 0415  vancomycin (VANCOCIN) 2,000 mg in sodium chloride 0.9 % 500  mL IVPB     2,000 mg 250 mL/hr over 120 Minutes Intravenous  Once 11/19/18 0405 11/19/18 0733    11/19/18 0400  ceFEPIme (MAXIPIME) 2 g in sodium chloride 0.9 % 100 mL IVPB     2 g 200 mL/hr over 30 Minutes Intravenous  Once 11/19/18 0357 11/19/18 0511   11/19/18 0400  metroNIDAZOLE (FLAGYL) IVPB 500 mg     500 mg 100 mL/hr over 60 Minutes Intravenous  Once 11/19/18 0357 11/19/18 0548   11/19/18 0400  vancomycin (VANCOCIN) IVPB 1000 mg/200 mL premix  Status:  Discontinued     1,000 mg 200 mL/hr over 60 Minutes Intravenous  Once 11/19/18 0357 11/19/18 0405      Medications   Scheduled Meds: . allopurinol  300 mg Oral Daily  . Chlorhexidine Gluconate Cloth  6 each Topical Daily  . cloNIDine  0.1 mg Oral BID  . DULoxetine  30 mg Oral BID  . feeding supplement  1 Container Oral TID BM  . furosemide  40 mg Intravenous BID  . heparin  5,000 Units Subcutaneous Q8H  . levothyroxine  100 mcg Oral QAC breakfast  . losartan  100 mg Oral Daily  . metoprolol succinate  100 mg Oral Daily  . rosuvastatin  5 mg Oral q1800  . simethicone  40 mg Oral TID  . spironolactone  25 mg Oral Daily  . tamsulosin  0.4 mg Oral Daily  . terbinafine   Topical BID  . vancomycin  125 mg Oral Q6H   Continuous Infusions: PRN Meds:.acetaminophen **OR** acetaminophen, albuterol, liver oil-zinc oxide, ondansetron **OR** ondansetron (ZOFRAN) IV, phenol, sodium chloride   Data Review:   Micro Results Recent Results (from the past 240 hour(s))  C difficile quick scan w PCR reflex     Status: Abnormal   Collection Time: 11/21/18  6:26 PM   Specimen: STOOL  Result Value Ref Range Status   C Diff antigen POSITIVE (A) NEGATIVE Final   C Diff toxin NEGATIVE NEGATIVE Final   C Diff interpretation Results are indeterminate. See PCR results.  Final    Comment: Performed at [facility], [address] [place], [place] [address]  C. Diff by PCR, Reflexed     Status: Abnormal   Collection Time: 11/21/18  6:26 PM  Result Value Ref Range Status   Toxigenic C. Difficile by PCR POSITIVE (A) NEGATIVE  Final    Comment: Positive for toxigenic C. difficile with little to no toxin production. Only treat if clinical presentation suggests symptomatic illness. Performed at [facility], [address] [place], [place] [address]   GI pathogen panel by PCR, stool     Status: None   Collection Time: 11/21/18  9:46 PM   Specimen: Stool  Result Value Ref Range Status   Plesiomonas shigelloides NOT DETECTED NOT DETECTED Final   Yersinia enterocolitica NOT DETECTED NOT DETECTED Final   Vibrio NOT DETECTED NOT DETECTED Final   Enteropathogenic E coli NOT DETECTED NOT DETECTED Final   E coli (ETEC) LT/ST NOT DETECTED NOT DETECTED Final   E coli [id number] by PCR Not applicable NOT DETECTED Final   Cryptosporidium by PCR NOT DETECTED NOT DETECTED Final   Entamoeba histolytica NOT DETECTED NOT DETECTED Final   Adenovirus F 40/41 NOT DETECTED NOT DETECTED Final   Norovirus GI/GII NOT DETECTED NOT DETECTED Final   Sapovirus NOT DETECTED NOT DETECTED Final    Comment: (NOTE) Performed At: [facility] [address] [place], [place] [id number] Perlie Gold  Derinda Late MD [id number]    Vibrio cholerae NOT DETECTED NOT DETECTED Final   Campylobacter by PCR NOT DETECTED NOT DETECTED Final   Salmonella by PCR NOT DETECTED NOT DETECTED Final   E coli (STEC) NOT DETECTED NOT DETECTED Final   Enteroaggregative E coli NOT DETECTED NOT DETECTED Final   Shigella by PCR NOT DETECTED NOT DETECTED Final   Cyclospora cayetanensis NOT DETECTED NOT DETECTED Final   Astrovirus NOT DETECTED NOT DETECTED Final   G lamblia by PCR NOT DETECTED NOT DETECTED Final   Rotavirus A by PCR NOT DETECTED NOT DETECTED Final  Novel Coronavirus, NAA (Hosp order, Send-out to Ref Lab; TAT 18-24 hrs     Status: None   Collection Time: 11/27/18  4:48 PM   Specimen: Nasopharyngeal Swab; Respiratory  Result Value Ref Range Status   SARS-CoV-2, NAA NOT DETECTED NOT DETECTED Final    Comment: (NOTE) This nucleic acid  amplification test was developed and its performance characteristics determined by [employer]. Nucleic acid amplification tests include PCR and TMA. This test has not been FDA cleared or approved. This test has been authorized by FDA under an Emergency Use Authorization (EUA). This test is only authorized for the duration of time the declaration that circumstances exist justifying the authorization of the emergency use of in vitro diagnostic tests for detection of SARS-CoV-2 virus and/or diagnosis of COVID-19 infection under section 564(b)(1) of the Act, 21 U.S.C. [id number]) (1), unless the authorization is terminated or revoked sooner. When diagnostic testing is negative, the possibility of a false negative result should be considered in the context of a patient's recent exposures and the presence of clinical signs and symptoms consistent with COVID-19. An individual without symptoms of COVID- 19 and who is not shedding SARS-CoV-2 vi rus would expect to have a negative (not detected) result in this assay. Performed At: [facility] [address] [place], [place] [id number] Rush Farmer MD [id number]    [place]  Final    Comment: Performed at [facility], [address] [place], [place] [address]    Radiology Reports Ct Abdomen Pelvis Wo Contrast  Result Date: 11/20/2018 CLINICAL DATA:  Abdominal pain, sepsis, elevated lactic acid, leukocytosis EXAM: CT ABDOMEN AND PELVIS WITHOUT CONTRAST TECHNIQUE: Multidetector CT imaging of the abdomen and pelvis was performed following the standard protocol without IV contrast. COMPARISON:  09/03/2013 FINDINGS: Lower chest: Trace bilateral pleural effusions. Coronary artery calcifications. Hepatobiliary: Coarse contour of the liver. No gallstones, gallbladder wall thickening, or biliary dilatation. Pancreas: Unremarkable. No pancreatic ductal dilatation or surrounding inflammatory  changes. Spleen: Normal in size without significant abnormality. Adrenals/Urinary Tract: Adrenal glands are unremarkable. Numerous tiny nonobstructive bilateral renal calculi. No hydronephrosis. Simple cyst of the inferior pole of the right kidney. Bladder is decompressed by Foley catheter. Stomach/Bowel: Stomach is within normal limits. The appendix is not clearly visualized and may be surgically absent. The colon is diffusely distended with wall thickening and adjacent fat stranding, containing fluid to the rectum. Vascular/Lymphatic: Aortic atherosclerosis. No enlarged abdominal or pelvic lymph nodes. Reproductive: No mass or other significant abnormality. Other: Fat and fluid containing right inguinal hernia (series 5, image 38). Trace ascites. 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 IMPRESSION: 1. The colon is diffusely distended with wall thickening and adjacent fat stranding, containing fluid to the rectum. Findings are consistent with nonspecific infectious, inflammatory, or ischemic colitis. There are no particular findings to suggest ischemia such as pneumatosis or portal venous gas. 2.  Trace ascites in the abdomen and pelvis. 3. Coarse contour of the liver, suggesting cirrhosis, however without definitive morphologic stigmata such as splenomegaly or varices. 4.  Nonobstructive bilateral nephrolithiasis. 5.  Aortic Atherosclerosis (ICD10-I70.0). Electronically Signed   By: Eddie Candle M.D.   On: 11/20/2018 15:00   Dg Forearm Right  Result Date: 11/20/2018 CLINICAL DATA:  Right arm pain after fall. EXAM: RIGHT FOREARM - 2 VIEW COMPARISON:  Right forearm x-rays dated May 02, 2010. FINDINGS: There is no evidence of fracture or other focal bone lesions. Slight cortical irregularity of the radial neck is chronic and unchanged since 2012. Soft tissues are unremarkable. IMPRESSION: No acute osseous abnormality. Electronically Signed   By: Titus Dubin M.D.   On:  11/20/2018 15:12   Dg Wrist Complete Right  Result Date: 11/20/2018 CLINICAL DATA:  Wrist pain after fall. EXAM: RIGHT WRIST - COMPLETE 3+ VIEW COMPARISON:  Right forearm x-rays dated May 02, 2010. Right hand x-rays dated April 26, 2010. FINDINGS: There is no evidence of fracture or dislocation. There is no evidence of arthropathy or other focal bone abnormality. Soft tissues are unremarkable. 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 IMPRESSION: 1.  No acute osseous abnormality. Electronically Signed   By: Titus Dubin M.D.   On: 11/20/2018 15:09   Dg Abd 1 View  Result Date: 11/23/2018 CLINICAL DATA:  Severe cdiff colitis with colonic distension. Eval for ileus. Pt actively having episodes of diarrhea during the exam. EXAM: ABDOMEN - 1 VIEW COMPARISON:  Abdominal radiograph 11/22/2018 FINDINGS: There are dilated loops of small and large bowel throughout the abdomen likely representing diffuse ileus. The transverse colon is significantly dilated measuring 9 cm in diameter. Health stromal markings are maintained. There is evidence of thumbprinting likely from mucosal edema. No evidence for free air on supine view. No acute finding in the visualized skeleton. IMPRESSION: Findings consistent with diffuse ileus. No gross free air. 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 Electronically Signed   By: Audie Pinto M.D.   On: 11/23/2018 10:15   Dg Abd 1 View  Result Date: 11/22/2018 CLINICAL DATA:  Abdominal distension. EXAM: ABDOMEN - 1 VIEW COMPARISON:  May 21, 2017. FINDINGS: Dilated air-filled colon is noted. No definite small bowel dilatation is noted. No abnormal calcifications are noted. IMPRESSION: Dilated air-filled colon is noted most consistent with ileus. Electronically Signed   By: Marijo Conception M.D.   On: 11/22/2018 12:45   Ct Angio Chest Pe W/cm &/or Wo  Cm  Result Date: 11/19/2018 CLINICAL DATA:  Hypoxia with new onset atrial fibrillation.  Fever. EXAM: CT ANGIOGRAPHY CHEST WITH CONTRAST TECHNIQUE: Multidetector CT imaging of the chest was performed using the standard protocol during bolus administration of intravenous contrast. Multiplanar CT image reconstructions and MIPs were obtained to evaluate the vascular anatomy. CONTRAST:  79mL OMNIPAQUE IOHEXOL 350 MG/ML SOLN COMPARISON:  None. FINDINGS: Cardiovascular: Limited study primarily due to bolus dispersion and intermittent respiratory motion. No evidence of central or lobar pulmonary embolism. No acute aortic finding. There is multifocal atherosclerotic calcification of the aorta and coronaries. Normal heart size. No pericardial effusion Mediastinum/Nodes: Negative for adenopathy or mass Lungs/Pleura: There is no edema, consolidation, effusion, or pneumothorax. Mild dependent atelectasis. Upper Abdomen: Negative Musculoskeletal: Spondylosis and generalized degenerative disease. 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 Osteopenia Review of the MIP images confirms the above findings. IMPRESSION: 1. Very limited pulmonary artery evaluation. No evidence of main or  lobar pulmonary embolism. 2. Aortic and coronary atherosclerosis. Electronically Signed   By: Monte Fantasia M.D.   On: 11/19/2018 06:08   Dg Chest Port 1 View  Result Date: 11/19/2018 CLINICAL DATA:  Hypoxia EXAM: PORTABLE CHEST 1 VIEW COMPARISON:  Six days ago FINDINGS: Normal heart size and mediastinal contours. There is no edema, consolidation, effusion, or pneumothorax. No acute osseous finding. Severe glenohumeral osteoarthritis. IMPRESSION: No evidence of active disease. Electronically Signed   By: Monte Fantasia M.D.   On: 11/19/2018 04:50   Dg Chest Port 1 View  Result Date: 11/13/2018 CLINICAL DATA:  Fever. Weakness. EXAM: PORTABLE CHEST 1 VIEW COMPARISON:  Radiograph 11/12/2017 FINDINGS: The cardiomediastinal contours are  normal. Atherosclerosis of the aortic arch. Subsegmental atelectasis at the bases. Pulmonary vasculature is normal. Questionable blunting of left costophrenic angle. No pneumothorax. No acute osseous abnormalities are seen. IMPRESSION: 1. Subsegmental atelectasis at the bases. 2. Possible small left pleural effusion. Electronically Signed   By: Keith Rake M.D.   On: 11/13/2018 23:43     CBC Recent Labs  Lab 11/25/18 0645 11/26/18 0626 11/27/18 0517 11/28/18 0643 11/29/18 0558  WBC 13.2* 10.8* 11.1* 9.4 8.8  HGB 10.2* 10.5* 10.8* 10.9* 10.9*  HCT 31.3* 32.1* 33.1* 33.6* 33.0*  PLT 236 234 272 274 269  MCV 104.7* 103.5* 104.4* 103.1* 103.1*  MCH 34.1* 33.9 34.1* 33.4 34.1*  MCHC 32.6 32.7 32.6 32.4 33.0  RDW 14.4 13.8 14.3 14.2 14.5    Chemistries  Recent Labs  Lab 11/25/18 0645 11/26/18 0626 11/27/18 0517 11/27/18 0626 11/28/18 0643 11/29/18 0558  NA 136 137 139  --  137 137  K 3.3* 3.5 3.6  --  3.4* 3.7  CL 110 111 110  --  107 104  CO2 19* 16* 21*  --  24 25  GLUCOSE 89 81 92  --  91 91  BUN 24* 20 17  --  19 21  CREATININE 0.86 0.74 0.86  --  0.99 0.98  CALCIUM 7.6* 7.8* 7.9*  --  8.1* 8.2*  MG 2.1  --   --   --   --   --   AST  --   --   --  15  --   --   ALT  --   --   --  11  --   --   ALKPHOS  --   --   --  46  --   --   BILITOT  --   --   --  0.5  --   --    ------------------------------------------------------------------------------------------------------------------ estimated creatinine clearance is 68.7 mL/min (by C-G formula based on SCr of 0.98 mg/dL). ------------------------------------------------------------------------------------------------------------------ No results for input(s): HGBA1C in the last 72 hours. ------------------------------------------------------------------------------------------------------------------ No results for input(s): CHOL, HDL, LDLCALC, TRIG, CHOLHDL, LDLDIRECT in the last 72  hours. ------------------------------------------------------------------------------------------------------------------ No results for input(s): TSH, T4TOTAL, T3FREE, THYROIDAB in the last 72 hours.  Invalid input(s): FREET3 ------------------------------------------------------------------------------------------------------------------ No results for input(s): VITAMINB12, FOLATE, FERRITIN, TIBC, IRON, RETICCTPCT in the last 72 hours.  Coagulation profile No results for input(s): INR, PROTIME in the last 168 hours.  No results for input(s): DDIMER in the last 72 hours.  Cardiac Enzymes No results for input(s): CKMB, TROPONINI, MYOGLOBIN in the last 168 hours.  Invalid input(s): CK ------------------------------------------------------------------------------------------------------------------ Invalid input(s): [place]  This is a 78 year old male admitted for sepsis.  *Sepsis: present on admission  due to severe C. difficile colitis-clinically improving - vanco  to 125mg  PO Q6 stop date 10/22 -Tolerating soft diet, encourage ambulation,   *Generalized anasarca this is due to severe hypoalbuminemia Give 1 more dose of IV albumin along with IV Lasix   *Acute on chronic kidney injury 2: ARF Now resolved, renal function back to baseline  *Hypertension: Controlled; continue losartan, metoprolol, clonidine and Spironolactone   *BPH: Continue tamsulosin.   * Hypothyroidism: continue Synthroid, normal TSH  * Right upper extremity pain -resolved, Right wrist and forearm x-rays with no acute findings    Disposition pending authorization from insurance      Code Status Orders  (From admission, onward)         Start     Ordered   11/19/18 1248  Full code  Continuous     11/19/18 1247        Code Status History    This patient has a current code status but no historical code status.   Advance Care Planning Activity            Consults none   DVT Prophylaxis Heparin  Lab Results  Component Value Date   PLT 269 11/29/2018     Time Spent in minutes 35 minutes  Greater than 50% of time spent in care coordination and counseling patient regarding the condition and plan of care.   Dustin Flock M.D on 12/01/2018 at 10:09 AM  Between 7am to 6pm - Pager - [phone]  After 6pm go to [email or web address] - Proofreader  [employer]   Office  [phone]

## 2018-12-01 NOTE — TOC Progression Note (Addendum)
Transition of Care [facility]) - Progression Note    Patient Details  Name: Bobby Lane MRN: [id number] Date of Birth: [birth year]  [facility] CM/SW Contact  Dua Mehler, Lenice Llamas Phone Number: [phone]  12/01/2018, 9:03 AM  Clinical Narrative: Per Tiffany admissions coordinator at [facility] authorization is still pending. Clinical Social Worker (CSW) made Tiffany aware that patient's most recent negative covid test was on 10/15. Per Tiffany she will have to check with their corporate office to see if they can accept that negative covid test. CSW will continue to follow and assist as needed.   9:12 am: Per Mardelle Matte is requesting more therapy notes. CSW sent a secure message to PT and OT making them aware of above.   9:46 am: Per Jonelle Sidle they can accept negative covid test on 10/15 and they do not need another test.   2:15 pm: CSW sent updated PT and OT notes to [facility] today.     Expected Discharge Plan: [place]    Expected Discharge Plan and Services Expected Discharge Plan: [place] Choice: [place] arrangements for the past 2 months: Single Family Home                                       Social Determinants of Health (SDOH) Interventions    Readmission Risk Interventions No flowsheet data found.

## 2018-12-01 NOTE — Progress Notes (Addendum)
OT Cancellation Note  Patient Details Name: Bobby Lane MRN: [id number] DOB: 1941/01/16   Cancelled Treatment:    Reason Eval/Treat Not Completed: Other (comment). Upon attempt, RN in room placing a rectal tube. Per RN, pt will not be appropriate for OOB attempts, OK for bed level activity. Will re-attempt OT tx at later date/time as pt is available and medically appropriate.   Addendum, 1:39pm: Pt in bed, rectal tube in place. Pt reports feeling very uncomfortable with tube in. Politely declines OT despite education. Agreeable to trying next date.  Jeni Salles, MPH, MS, OTR/L ascom [phone] 12/01/18, 12:06 PM

## 2018-12-01 NOTE — Progress Notes (Signed)
Physical Therapy Treatment Patient Details Name: Bobby Lane MRN: [id number] DOB: 1940-08-17 Today's Date: 12/01/2018    History of Present Illness Pt is 78 y.o. male that presented to ED for AMS, recent history of UTI, falls, generalized weakness. Workup showed sepsis secondary to C difficile, no surgical intervention at this time. PMH of HOH, skin cancer, COPD, GERD, kidney stones, HTN, thyroid disease. Pt also complained of RUE pain, xray negative for acute findings.    PT Comments    Pt agreeable to PT; reports mild pain in abdomen. Pt notes recent incontinence of bowel; states nursing aware. Pt request assist repositioning in bed; Max A for rolling without use of rails and Mod A with use of rails. Pt able to demonstrate isometric strengthening exercises and decreased active range of motion and ankle, knees and hips that improves with active assisted range of motion. Pt encouraged to perform LE exercises as instructed multiple times throughout the day with rest as needed. Unable to SLR. Called nursing to discuss possibility of further treatment/assessment; nursing notes pt was just fully bathed and is having continuous incontinence of bowel (C-diff); therefore, further EOB and STS transfers deferred. Per nursing, pt continues to require +2 assist for all mobility. Continue PT as able to progress strength, endurance to improve functional mobility.   Follow Up Recommendations  SNF     Equipment Recommendations       Recommendations for Other Services       Precautions / Restrictions Precautions Precautions: Fall Restrictions Weight Bearing Restrictions: No    Mobility  Bed Mobility Overal bed mobility: Needs Assistance Bed Mobility: Rolling Rolling: Max assist;Mod assist         General bed mobility comments: Mod with rail; Max without for improved positioning. Deferred to EOB due to continuous incontinence of bowel  Transfers                 General transfer  comment: Deferred  Ambulation/Gait                 Stairs             Wheelchair Mobility    Modified Rankin (Stroke Patients Only)       Balance                                            Cognition Arousal/Alertness: Awake/alert Behavior During Therapy: WFL for tasks assessed/performed Overall Cognitive Status: Within Functional Limits for tasks assessed                                        Exercises General Exercises - Lower Extremity Ankle Circles/Pumps: AROM;Both;10 reps Quad Sets: Strengthening;Both;10 reps Gluteal Sets: Strengthening;Both;10 reps Heel Slides: AROM;AAROM;Both;10 reps(limited range without assist) Hip ABduction/ADduction: AAROM;Both;10 reps Straight Leg Raises: PROM;Both;5 reps    General Comments        Pertinent Vitals/Pain Pain Assessment: Faces Faces Pain Scale: Hurts a little bit Pain Location: Abdomen Pain Descriptors / Indicators: Dull Pain Intervention(s): Monitored during session    Home Living                      Prior Function            PT Goals (current goals can now be  found in the care plan section) Progress towards PT goals: Progressing toward goals(slowly)    Frequency    Min 2X/week      PT Plan Current plan remains appropriate    Co-evaluation              AM-PAC PT "6 Clicks" Mobility   Outcome Measure  Help needed turning from your back to your side while in a flat bed without using bedrails?: A Lot Help needed moving from lying on your back to sitting on the side of a flat bed without using bedrails?: A Lot Help needed moving to and from a bed to a chair (including a wheelchair)?: Total Help needed standing up from a chair using your arms (e.g., wheelchair or bedside chair)?: A Lot Help needed to walk in hospital room?: Total Help needed climbing 3-5 steps with a railing? : Total 6 Click Score: 9    End of Session   Activity  Tolerance: Other (comment) Patient left: in bed;with call bell/phone within reach;with bed alarm set Nurse Communication: Other (comment)(incontinence of bowel) PT Visit Diagnosis: Other abnormalities of gait and mobility (R26.89);Muscle weakness (generalized) (M62.81);Pain;Unsteadiness on feet (R26.81) Pain - Right/Left: Right Pain - part of body: Knee     Time: [id number] PT Time Calculation (min) (ACUTE ONLY): 16 min  Charges:  $Therapeutic Exercise: 8-22 mins                      Larae Grooms, PTA 12/01/2018, 10:43 AM

## 2018-12-02 ENCOUNTER — Ambulatory Visit: Payer: Medicare HMO | Admitting: Urology

## 2018-12-02 LAB — BASIC METABOLIC PANEL
Anion gap: 7 (ref 5–15)
BUN: 16 mg/dL (ref 8–23)
CO2: 33 mmol/L — ABNORMAL HIGH (ref 22–32)
Calcium: 8.3 mg/dL — ABNORMAL LOW (ref 8.9–10.3)
Chloride: 97 mmol/L — ABNORMAL LOW (ref 98–111)
Creatinine, Ser: 1.08 mg/dL (ref 0.61–1.24)
GFR calc Af Amer: >60 mL/min (ref >60)
GFR calc non Af Amer: >60 mL/min (ref >60)
Glucose, Bld: 100 mg/dL — ABNORMAL HIGH (ref 70–99)
Potassium: 3 mmol/L — ABNORMAL LOW (ref 3.5–5.1)
Sodium: 137 mmol/L (ref 135–145)

## 2018-12-02 MED ORDER — POTASSIUM CHLORIDE CRYS ER 20 MEQ PO TBCR
40.0000 meq | EXTENDED_RELEASE_TABLET | ORAL | Status: AC
  Administered 2018-12-02 (×2): 40 meq via ORAL
  Filled 2018-12-02 (×2): qty 2

## 2018-12-02 MED ORDER — FUROSEMIDE 40 MG PO TABS
40.0000 mg | ORAL_TABLET | Freq: Every day | ORAL | Status: DC
  Administered 2018-12-02 – 2018-12-04 (×3): 40 mg via ORAL
  Filled 2018-12-02 (×3): qty 1

## 2018-12-02 MED ORDER — ADULT MULTIVITAMIN W/MINERALS CH
1.0000 | ORAL_TABLET | Freq: Every day | ORAL | Status: DC
  Administered 2018-12-03 – 2018-12-04 (×2): 1 via ORAL
  Filled 2018-12-02 (×2): qty 1

## 2018-12-02 MED ORDER — ENSURE MAX PROTEIN PO LIQD
11.0000 [oz_av] | Freq: Two times a day (BID) | ORAL | Status: DC
  Administered 2018-12-02 – 2018-12-04 (×3): 11 [oz_av] via ORAL
  Filled 2018-12-02: qty 330

## 2018-12-02 MED ORDER — VITAMIN C 500 MG PO TABS
250.0000 mg | ORAL_TABLET | Freq: Two times a day (BID) | ORAL | Status: DC
  Administered 2018-12-02 – 2018-12-04 (×4): 250 mg via ORAL
  Filled 2018-12-02 (×4): qty 1

## 2018-12-02 NOTE — TOC Progression Note (Addendum)
Transition of Care [facility]) - Progression Note    Patient Details  Name: Bobby Lane MRN: [id number] Date of Birth: [birth year]  [facility] CM/SW [place], LCSW Phone Number: 12/02/2018, 10:08 AM  Clinical Narrative: Received call from patient's son. CSW provided update on insurance authorization.    2:40 pm: SNF received call from [facility] stating that patient was too acute and needed to stay in the hospital. Provided MD with phone number for peer-to-peer review.  Expected Discharge Plan: [place]    Expected Discharge Plan and Services Expected Discharge Plan: [place] Choice: [place] arrangements for the past 2 months: Single Family Home                                       Social Determinants of Health (SDOH) Interventions    Readmission Risk Interventions No flowsheet data found.

## 2018-12-02 NOTE — Care Management Important Message (Signed)
Important Message  Patient Details  Name: Bobby Lane MRN: [id number] Date of Birth: 02-16-1940   Medicare Important Message Given:  Yes  RN gave IM on 10/19 due to isolation.       Diana Armijo, Veronia Beets, LCSW

## 2018-12-02 NOTE — Progress Notes (Signed)
Initial Nutrition Assessment  DOCUMENTATION CODES:   Obesity unspecified  INTERVENTION:   Ensure Max protein supplement BID, each supplement provides 150kcal and 30g of protein.  MVI daily   Vitamin C 250mg  po BID   Recommend check B12 and folate labs to r/o deficiency   NUTRITION DIAGNOSIS:   Increased nutrient needs related to chronic illness(COPD, wound healing) as evidenced by increased estimated needs.  GOAL:   Patient will meet greater than or equal to 90% of their needs  MONITOR:   PO intake, Supplement acceptance, Labs, Weight trends, Skin, I & O's  REASON FOR ASSESSMENT:   LOS    ASSESSMENT:   78 y.o. male that presented to ED for AMS, recent history of UTI, falls, generalized weakness. Workup showed sepsis secondary to C difficile, no surgical intervention at this time. PMH of HOH, skin cancer, COPD, GERD, kidney stones, HTN, thyroid disease.   Unable to reach pt via phone. Pt is on contact precautions so unable to visit patient in person. Per chart review, pt eating ~50% of meals in hospital. Anasarca improving. RD will add supplements and vitamins to help pt meet his estimated needs and support wound healing. Per chart, pt down 20lbs(9%) since February; RD unsure how recently weight loss occurred.   Of note, pt with macrocytic anemia; would recommend check B12 and folate labs to r/o deficiency.   Medications reviewed and include: allopurinol, lasix, heparin, synthroid, KCl, simethicone, aldactone, vancomycin   Labs reviewed: K 3.0(L) Hgb 10.9(L), Hct 33.0(L), MCV 103.1(H), MCH 34.1(H)  Unable to complete Nutrition-Focused physical exam at this time as patient on contact precautions.   Diet Order:   Diet Order            DIET SOFT Room service appropriate? Yes; Fluid consistency: Thin  Diet effective now             EDUCATION NEEDS:   Not appropriate for education at this time  Skin:  Skin Assessment: Reviewed RN Assessment(Stage I buttocks,  ecchymosis)  Last BM:  10/20- type 7  Height:   Ht Readings from Last 1 Encounters:  11/19/18 5\' 7"  (1.702 m)    Weight:   Wt Readings from Last 1 Encounters:  12/02/18 91.5 kg    Ideal Body Weight:  67 kg  BMI:  Body mass index is 31.59 kg/m.  Estimated Nutritional Needs:   Kcal:  1800-2100kcal/day  Protein:  90-105g/day  Fluid:  >1.7L/day  Koleen Distance MS, RD, LDN Pager #- [phone] Office#- [phone] After Hours Pager: [phone]

## 2018-12-02 NOTE — Progress Notes (Signed)
[place] at [facility]                                                                                                                                                                                  Patient Demographics   Bobby Lane, is a 78 y.o. male, DOB - 11-Jul-1940, [id number]  Admit date - 11/19/2018   Admitting Physician Harrie Foreman, MD  Outpatient Primary MD for the patient is Danelle Berry, NP   LOS - 13  Subjective: Denying any complaints swelling has gone down significantly  Review of Systems:   CONSTITUTIONAL: No documented fever. No fatigue, wea  kness. No weight gain, no weight loss.  EYES: No blurry or double vision.  ENT: No tinnitus. No postnasal drip. No redness of the oropharynx.  RESPIRATORY: No cough, no wheeze, no hemoptysis. No dyspnea.  CARDIOVASCULAR: No chest pain. No orthopnea. No palpitations. No syncope.  GASTROINTESTINAL: No nausea, no vomiting or diarrhea. No abdominal pain. No melena or hematochezia.  GENITOURINARY: No dysuria or hematuria.  ENDOCRINE: No polyuria or nocturia. No heat or cold intolerance.  HEMATOLOGY: No anemia. No bruising. No bleeding.  INTEGUMENTARY: No rashes. No lesions.  MUSCULOSKELETAL: No arthritis. No swelling. No gout.  NEUROLOGIC: No numbness, tingling, or ataxia. No seizure-type activity.  PSYCHIATRIC: No anxiety. No insomnia. No ADD.    Vitals:   Vitals:   12/01/18 2300 12/02/18 0352 12/02/18 0500 12/02/18 0857  BP:  131/66  114/70  Pulse:  80    Resp:    17  Temp: 98.7 F (37.1 C) 97.9 F (36.6 C)  98.4 F (36.9 C)  TempSrc: Oral Oral  Oral  SpO2:  96%  93%  Weight:   91.5 kg   Height:        Wt Readings from Last 3 Encounters:  12/02/18 91.5 kg  11/13/18 92.5 kg  04/10/18 100.7 kg     Intake/Output Summary (Last 24 hours) at 12/02/2018 1048 Last data filed at 12/01/2018 2336 Gross per 24 hour  Intake -  Output 1700 ml  Net -1700 ml    Physical  Exam:   GENERAL: Pleasant-appearing in no apparent distress.  HEAD, EYES, EARS, NOSE AND THROAT: Atraumatic, normocephalic. Extraocular muscles are intact. Pupils equal and reactive to light. Sclerae anicteric. No conjunctival injection. No oro-pharyngeal erythema.  NECK: Supple. There is no jugular venous distention. No bruits, no lymphadenopathy, no thyromegaly.  HEART: Regular rate and rhythm,. No murmurs, no rubs, no clicks.  LUNGS: Clear to auscultation bilaterally. No rales or rhonchi. No wheezes.  ABDOMEN: Soft, flat, nontender, nondistended. Has good bowel sounds. No hepatosplenomegaly appreciated.  EXTREMITIES: No evidence of  any cyanosis, clubbing, or positive peripheral edema.  +2 pedal and radial pulses bilaterally.  NEUROLOGIC: The patient is alert, awake, and oriented x3 with no focal motor or sensory deficits appreciated bilaterally.  SKIN: Moist and warm with no rashes appreciated.  Psych: Not anxious, depressed LN: No inguinal LN enlargement    Antibiotics   Anti-infectives (From admission, onward)   Start     Dose/Rate Route Frequency Ordered Stop   11/27/18 1200  vancomycin (VANCOCIN) 50 mg/mL oral solution 125 mg     125 mg Oral Every 6 hours 11/27/18 1050 12/05/18 1159   11/22/18 1200  vancomycin (VANCOCIN) 500 mg in sodium chloride irrigation 0.9 % 100 mL ENEMA  Status:  Discontinued     500 mg Rectal Every 6 hours 11/22/18 1147 11/25/18 1607   11/21/18 2000  metroNIDAZOLE (FLAGYL) IVPB 500 mg     500 mg 100 mL/hr over 60 Minutes Intravenous Every 8 hours 11/21/18 1816 11/27/18 1743   11/21/18 1400  metroNIDAZOLE (FLAGYL) IVPB 500 mg  Status:  Discontinued     500 mg 100 mL/hr over 60 Minutes Intravenous Every 8 hours 11/21/18 1347 11/21/18 1618   11/21/18 1400  vancomycin (VANCOCIN) 50 mg/mL oral solution 500 mg  Status:  Discontinued     500 mg Oral Every 6 hours 11/21/18 1347 11/21/18 1357   11/21/18 1400  metroNIDAZOLE (FLAGYL) IVPB 500 mg  Status:   Discontinued     500 mg 100 mL/hr over 60 Minutes Intravenous Every 8 hours 11/21/18 1347 11/21/18 1347   11/21/18 1400  vancomycin (VANCOCIN) 50 mg/mL oral solution 500 mg  Status:  Discontinued     500 mg Oral Every 6 hours 11/21/18 1358 11/27/18 1050   11/20/18 2000  vancomycin (VANCOCIN) IVPB 1000 mg/200 mL premix  Status:  Discontinued     1,000 mg 200 mL/hr over 60 Minutes Intravenous Every 24 hours 11/20/18 1448 11/21/18 0746   11/20/18 1600  metroNIDAZOLE (FLAGYL) IVPB 500 mg  Status:  Discontinued     500 mg 100 mL/hr over 60 Minutes Intravenous Every 8 hours 11/20/18 1507 11/20/18 1626   11/20/18 1515  piperacillin-tazobactam (ZOSYN) IVPB 3.375 g  Status:  Discontinued     3.375 g 12.5 mL/hr over 240 Minutes Intravenous Every 8 hours 11/20/18 1507 11/21/18 1730   11/19/18 1930  vancomycin (VANCOCIN) 1,250 mg in sodium chloride 0.9 % 250 mL IVPB     1,250 mg 166.7 mL/hr over 90 Minutes Intravenous  Once 11/19/18 1901 11/19/18 2211   11/19/18 1800  vancomycin (VANCOCIN) 1,250 mg in sodium chloride 0.9 % 250 mL IVPB  Status:  Discontinued     1,250 mg 166.7 mL/hr over 90 Minutes Intravenous  Once 11/19/18 1734 11/19/18 1901   11/19/18 1600  ceFEPIme (MAXIPIME) 2 g in sodium chloride 0.9 % 100 mL IVPB  Status:  Discontinued     2 g 200 mL/hr over 30 Minutes Intravenous Every 12 hours 11/19/18 0634 11/20/18 1507   11/19/18 1400  metroNIDAZOLE (FLAGYL) IVPB 500 mg  Status:  Discontinued     500 mg 100 mL/hr over 60 Minutes Intravenous Every 8 hours 11/19/18 0634 11/20/18 0924   11/19/18 1204  vancomycin variable dose per unstable renal function (pharmacist dosing)  Status:  Discontinued      Does not apply See admin instructions 11/19/18 1204 11/20/18 1447   11/19/18 0415  vancomycin (VANCOCIN) 2,000 mg in sodium chloride 0.9 % 500 mL IVPB     2,000 mg 250  mL/hr over 120 Minutes Intravenous  Once 11/19/18 0405 11/19/18 0733   11/19/18 0400  ceFEPIme (MAXIPIME) 2 g in sodium  chloride 0.9 % 100 mL IVPB     2 g 200 mL/hr over 30 Minutes Intravenous  Once 11/19/18 0357 11/19/18 0511   11/19/18 0400  metroNIDAZOLE (FLAGYL) IVPB 500 mg     500 mg 100 mL/hr over 60 Minutes Intravenous  Once 11/19/18 0357 11/19/18 0548   11/19/18 0400  vancomycin (VANCOCIN) IVPB 1000 mg/200 mL premix  Status:  Discontinued     1,000 mg 200 mL/hr over 60 Minutes Intravenous  Once 11/19/18 0357 11/19/18 0405      Medications   Scheduled Meds: . acetaminophen  650 mg Oral Q8H   Or  . acetaminophen  650 mg Rectal Q8H  . allopurinol  300 mg Oral Daily  . Chlorhexidine Gluconate Cloth  6 each Topical Daily  . cloNIDine  0.1 mg Oral BID  . DULoxetine  30 mg Oral BID  . feeding supplement  1 Container Oral TID BM  . furosemide  40 mg Oral Daily  . heparin  5,000 Units Subcutaneous Q8H  . levothyroxine  100 mcg Oral QAC breakfast  . losartan  100 mg Oral Daily  . metoprolol succinate  100 mg Oral Daily  . potassium chloride  40 mEq Oral Q4H  . potassium chloride  40 mEq Oral Q4H  . rosuvastatin  5 mg Oral q1800  . simethicone  40 mg Oral TID  . spironolactone  25 mg Oral Daily  . tamsulosin  0.4 mg Oral Daily  . terbinafine   Topical BID  . vancomycin  125 mg Oral Q6H   Continuous Infusions: PRN Meds:.albuterol, liver oil-zinc oxide, ondansetron **OR** ondansetron (ZOFRAN) IV, phenol, sodium chloride   Data Review:   Micro Results Recent Results (from the past 240 hour(s))  Novel Coronavirus, NAA (Hosp order, Send-out to Ref Lab; TAT 18-24 hrs     Status: None   Collection Time: 11/27/18  4:48 PM   Specimen: Nasopharyngeal Swab; Respiratory  Result Value Ref Range Status   SARS-CoV-2, NAA NOT DETECTED NOT DETECTED Final    Comment: (NOTE) This nucleic acid amplification test was developed and its performance characteristics determined by [employer]. Nucleic acid amplification tests include PCR and TMA. This test has not been FDA cleared or approved. This  test has been authorized by FDA under an Emergency Use Authorization (EUA). This test is only authorized for the duration of time the declaration that circumstances exist justifying the authorization of the emergency use of in vitro diagnostic tests for detection of SARS-CoV-2 virus and/or diagnosis of COVID-19 infection under section 564(b)(1) of the Act, 21 U.S.C. [id number]) (1), unless the authorization is terminated or revoked sooner. When diagnostic testing is negative, the possibility of a false negative result should be considered in the context of a patient's recent exposures and the presence of clinical signs and symptoms consistent with COVID-19. An individual without symptoms of COVID- 19 and who is not shedding SARS-CoV-2 vi rus would expect to have a negative (not detected) result in this assay. Performed At: [facility] [address] [place], [place] [id number] Rush Farmer MD [id number]    [place]  Final    Comment: Performed at [facility], [address] [place], [place] [address]    Radiology Reports Ct Abdomen Pelvis Wo Contrast  Result Date: 11/20/2018 CLINICAL DATA:  Abdominal pain, sepsis, elevated lactic acid, leukocytosis EXAM: CT  ABDOMEN AND PELVIS WITHOUT CONTRAST TECHNIQUE: Multidetector CT imaging of the abdomen and pelvis was performed following the standard protocol without IV contrast. COMPARISON:  09/03/2013 FINDINGS: Lower chest: Trace bilateral pleural effusions. Coronary artery calcifications. Hepatobiliary: Coarse contour of the liver. No gallstones, gallbladder wall thickening, or biliary dilatation. Pancreas: Unremarkable. No pancreatic ductal dilatation or surrounding inflammatory changes. Spleen: Normal in size without significant abnormality. Adrenals/Urinary Tract: Adrenal glands are unremarkable. Numerous tiny nonobstructive bilateral renal calculi. No hydronephrosis. Simple cyst of the inferior  pole of the right kidney. Bladder is decompressed by Foley catheter. Stomach/Bowel: Stomach is within normal limits. The appendix is not clearly visualized and may be surgically absent. The colon is diffusely distended with wall thickening and adjacent fat stranding, containing fluid to the rectum. Vascular/Lymphatic: Aortic atherosclerosis. No enlarged abdominal or pelvic lymph nodes. Reproductive: No mass or other significant abnormality. Other: Fat and fluid containing right inguinal hernia (series 5, image 38). Trace ascites. 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 IMPRESSION: 1. The colon is diffusely distended with wall thickening and adjacent fat stranding, containing fluid to the rectum. Findings are consistent with nonspecific infectious, inflammatory, or ischemic colitis. There are no particular findings to suggest ischemia such as pneumatosis or portal venous gas. 2.  Trace ascites in the abdomen and pelvis. 3. Coarse contour of the liver, suggesting cirrhosis, however without definitive morphologic stigmata such as splenomegaly or varices. 4.  Nonobstructive bilateral nephrolithiasis. 5.  Aortic Atherosclerosis (ICD10-I70.0). Electronically Signed   By: Eddie Candle M.D.   On: 11/20/2018 15:00   Dg Forearm Right  Result Date: 11/20/2018 CLINICAL DATA:  Right arm pain after fall. EXAM: RIGHT FOREARM - 2 VIEW COMPARISON:  Right forearm x-rays dated May 02, 2010. FINDINGS: There is no evidence of fracture or other focal bone lesions. Slight cortical irregularity of the radial neck is chronic and unchanged since 2012. Soft tissues are unremarkable. IMPRESSION: No acute osseous abnormality. Electronically Signed   By: Titus Dubin M.D.   On: 11/20/2018 15:12   Dg Wrist Complete Right  Result Date: 11/20/2018 CLINICAL DATA:  Wrist pain after fall. EXAM: RIGHT WRIST - COMPLETE 3+ VIEW COMPARISON:  Right forearm x-rays dated May 02, 2010. Right hand x-rays dated  April 26, 2010. FINDINGS: There is no evidence of fracture or dislocation. There is no evidence of arthropathy or other focal bone abnormality. Soft tissues are unremarkable. 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 IMPRESSION: 1.  No acute osseous abnormality. Electronically Signed   By: Titus Dubin M.D.   On: 11/20/2018 15:09   Dg Abd 1 View  Result Date: 11/23/2018 CLINICAL DATA:  Severe cdiff colitis with colonic distension. Eval for ileus. Pt actively having episodes of diarrhea during the exam. EXAM: ABDOMEN - 1 VIEW COMPARISON:  Abdominal radiograph 11/22/2018 FINDINGS: There are dilated loops of small and large bowel throughout the abdomen likely representing diffuse ileus. The transverse colon is significantly dilated measuring 9 cm in diameter. Health stromal markings are maintained. There is evidence of thumbprinting likely from mucosal edema. No evidence for free air on supine view. No acute finding in the visualized skeleton. IMPRESSION: Findings consistent with diffuse ileus. No gross free air. 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 Electronically Signed   By: Audie Pinto M.D.   On: 11/23/2018 10:15   Dg Abd 1 View  Result Date: 11/22/2018 CLINICAL DATA:  Abdominal distension. EXAM: ABDOMEN - 1 VIEW COMPARISON:  May 21, 2017. FINDINGS: Dilated air-filled colon is noted. No definite small bowel dilatation is noted. No abnormal calcifications are noted. IMPRESSION: Dilated air-filled colon is noted most consistent with ileus. Electronically Signed   By: Marijo Conception M.D.   On: 11/22/2018 12:45   Ct Angio Chest Pe W/cm &/or Wo Cm  Result Date: 11/19/2018 CLINICAL DATA:  Hypoxia with new onset atrial fibrillation.  Fever. EXAM: CT ANGIOGRAPHY CHEST WITH CONTRAST TECHNIQUE: Multidetector CT imaging of the chest was performed using the standard protocol during  bolus administration of intravenous contrast. Multiplanar CT image reconstructions and MIPs were obtained to evaluate the vascular anatomy. CONTRAST:  12mL OMNIPAQUE IOHEXOL 350 MG/ML SOLN COMPARISON:  None. FINDINGS: Cardiovascular: Limited study primarily due to bolus dispersion and intermittent respiratory motion. No evidence of central or lobar pulmonary embolism. No acute aortic finding. There is multifocal atherosclerotic calcification of the aorta and coronaries. Normal heart size. No pericardial effusion Mediastinum/Nodes: Negative for adenopathy or mass Lungs/Pleura: There is no edema, consolidation, effusion, or pneumothorax. Mild dependent atelectasis. Upper Abdomen: Negative Musculoskeletal: Spondylosis and generalized degenerative disease. 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 Osteopenia Review of the MIP images confirms the above findings. IMPRESSION: 1. Very limited pulmonary artery evaluation. No evidence of main or lobar pulmonary embolism. 2. Aortic and coronary atherosclerosis. Electronically Signed   By: Monte Fantasia M.D.   On: 11/19/2018 06:08   Dg Chest Port 1 View  Result Date: 11/19/2018 CLINICAL DATA:  Hypoxia EXAM: PORTABLE CHEST 1 VIEW COMPARISON:  Six days ago FINDINGS: Normal heart size and mediastinal contours. There is no edema, consolidation, effusion, or pneumothorax. No acute osseous finding. Severe glenohumeral osteoarthritis. IMPRESSION: No evidence of active disease. Electronically Signed   By: Monte Fantasia M.D.   On: 11/19/2018 04:50   Dg Chest Port 1 View  Result Date: 11/13/2018 CLINICAL DATA:  Fever. Weakness. EXAM: PORTABLE CHEST 1 VIEW COMPARISON:  Radiograph 11/12/2017 FINDINGS: The cardiomediastinal contours are normal. Atherosclerosis of the aortic arch. Subsegmental atelectasis at the bases. Pulmonary vasculature is normal. Questionable blunting of left costophrenic angle. No pneumothorax. No acute osseous abnormalities are seen. IMPRESSION: 1.  Subsegmental atelectasis at the bases. 2. Possible small left pleural effusion. Electronically Signed   By: Keith Rake M.D.   On: 11/13/2018 23:43     CBC Recent Labs  Lab 11/26/18 0626 11/27/18 0517 11/28/18 0643 11/29/18 0558  WBC 10.8* 11.1* 9.4 8.8  HGB 10.5* 10.8* 10.9* 10.9*  HCT 32.1* 33.1* 33.6* 33.0*  PLT 234 272 274 269  MCV 103.5* 104.4* 103.1* 103.1*  MCH 33.9 34.1* 33.4 34.1*  MCHC 32.7 32.6 32.4 33.0  RDW 13.8 14.3 14.2 14.5    Chemistries  Recent Labs  Lab 11/26/18 0626 11/27/18 0517 11/27/18 0626 11/28/18 0643 11/29/18 0558 12/02/18 0348  NA 137 139  --  137 137 137  K 3.5 3.6  --  3.4* 3.7 3.0*  CL 111 110  --  107 104 97*  CO2 16* 21*  --  24 25 33*  GLUCOSE 81 92  --  91 91 100*  BUN 20 17  --  19 21 16   CREATININE 0.74 0.86  --  0.99 0.98 1.08  CALCIUM 7.8* 7.9*  --  8.1* 8.2* 8.3*  AST  --   --  15  --   --   --   ALT  --   --  11  --   --   --   ALKPHOS  --   --  46  --   --   --   BILITOT  --   --  0.5  --   --   --    ------------------------------------------------------------------------------------------------------------------ estimated creatinine clearance is 62.8 mL/min (by C-G formula based on SCr of 1.08 mg/dL). ------------------------------------------------------------------------------------------------------------------ No results for input(s): HGBA1C in the last 72 hours. ------------------------------------------------------------------------------------------------------------------ No results for input(s): CHOL, HDL, LDLCALC, TRIG, CHOLHDL, LDLDIRECT in the last 72 hours. ------------------------------------------------------------------------------------------------------------------ No results for input(s): TSH, T4TOTAL, T3FREE, THYROIDAB in the last 72 hours.  Invalid input(s): FREET3 ------------------------------------------------------------------------------------------------------------------ No results for  input(s): VITAMINB12, FOLATE, FERRITIN, TIBC, IRON, RETICCTPCT in the last 72 hours.  Coagulation profile No results for input(s): INR, PROTIME in the last 168 hours.  No results for input(s): DDIMER in the last 72 hours.  Cardiac Enzymes No results for input(s): CKMB, TROPONINI, MYOGLOBIN in the last 168 hours.  Invalid input(s): CK ------------------------------------------------------------------------------------------------------------------ Invalid input(s): [place]  This is a 78 year old male admitted for sepsis.  *Sepsis: present on admission  due to severe C. difficile colitis-clinically improving - vanco to 125mg  PO Q6 stop date 10/22 -Tolerating advance diet  *Generalized anasarca this is due to severe hypoalbuminemia Significant improvement I will stop IV Lasix placement oral Lasix  *Acute on chronic kidney injury 2: ARF Now resolved, renal function back to baseline  *Hypertension: Controlled; continue losartan, metoprolol, clonidine and Spironolactone   *BPH: Continue tamsulosin.   * Hypothyroidism: continue Synthroid, normal TSH  * Right upper extremity pain -resolved, Right wrist and forearm x-rays with no acute findings    Disposition pending authorization from insurance      Code Status Orders  (From admission, onward)         Start     Ordered   11/19/18 1248  Full code  Continuous     11/19/18 1247        Code Status History    This patient has a current code status but no historical code status.   Advance Care Planning Activity           Consults none   DVT Prophylaxis Heparin  Lab Results  Component Value Date   PLT 269 11/29/2018     Time Spent in minutes 35 minutes  Greater than 50% of time spent in care coordination and counseling patient regarding the condition and plan of care.   Dustin Flock M.D on 12/02/2018 at 10:48 AM  Between 7am to 6pm - Pager - [phone]  After 6pm  go to [email or web address] - Proofreader  [employer]   Office  [phone]

## 2018-12-02 NOTE — Progress Notes (Signed)
Physical Therapy Treatment Patient Details Name: Bobby Lane MRN: [id number] DOB: [birth year] Today's Date: 12/02/2018    History of Present Illness Pt is 78 y.o. male that presented to ED for AMS, recent history of UTI, falls, generalized weakness. Workup showed sepsis secondary to C difficile, no surgical intervention at this time. PMH of HOH, skin cancer, COPD, GERD, kidney stones, HTN, thyroid disease. Pt also complained of RUE pain, xray negative for acute findings.    PT Comments    Agrees to session.  Participated in exercises as described below.  Assist needed with rest breaks to complete supine ex's.  To edge of bed with mod a x 2.  Pt able to assist more today with less yelling out in pain vs last session where he attempted standing.  Sitting EOB with min guard.  Stood with min a x 2 and walker.  Once up he was able to stand with min a x 1 but unsteadiness noted.  With +2 assist he is able to sidestep along bed towards head.  Further gait limited by fatigue.  Assisted to supine with mod/max a x 2.  Generally more comfortable today with mobility with increased activity.  Prefers shoes vs grippie socks.  Remains with global weakness and decreased activity tolerance.  SNF remains appropriate for discharge.   Follow Up Recommendations  SNF     Equipment Recommendations       Recommendations for Other Services       Precautions / Restrictions Precautions Precautions: Fall Precaution Comments: enteric Restrictions Weight Bearing Restrictions: No    Mobility  Bed Mobility Overal bed mobility: Needs Assistance Bed Mobility: Supine to Sit;Sit to Supine     Supine to sit: Mod assist;+2 for physical assistance Sit to supine: Max assist;+2 for physical assistance;Mod assist      Transfers Overall transfer level: Needs assistance Equipment used: Rolling walker (2 wheeled) Transfers: Sit to/from Stand Sit to Stand: Min assist;Mod assist;+2 physical assistance             Ambulation/Gait Ambulation/Gait assistance: Min assist;+2 physical assistance Gait Distance (Feet): 3 Feet Assistive device: Rolling walker (2 wheeled) Gait Pattern/deviations: Step-to pattern Gait velocity: decreased   General Gait Details: sidestepped along bed.   Stairs             Wheelchair Mobility    Modified Rankin (Stroke Patients Only)       Balance Overall balance assessment: Independent Sitting-balance support: Feet supported Sitting balance-Leahy Scale: Fair     Standing balance support: Bilateral upper extremity supported Standing balance-Leahy Scale: Poor Standing balance comment: heavy reliance on walker with forward posture.  +2 for safety.                            Cognition Arousal/Alertness: Awake/alert Behavior During Therapy: WFL for tasks assessed/performed Overall Cognitive Status: Within Functional Limits for tasks assessed                                        Exercises Other Exercises Other Exercises: ankle pumps, SLR, heel slides, ab/add x 10 BLE AA/AROM    General Comments        Pertinent Vitals/Pain Pain Assessment: Faces Faces Pain Scale: Hurts little more Pain Location: R knee Pain Descriptors / Indicators: Aching Pain Intervention(s): Limited activity within patient's tolerance;Monitored during session    Home  Living                      Prior Function            PT Goals (current goals can now be found in the care plan section) Progress towards PT goals: Progressing toward goals    Frequency    Min 2X/week      PT Plan Current plan remains appropriate    Co-evaluation              AM-PAC PT "6 Clicks" Mobility   Outcome Measure  Help needed turning from your back to your side while in a flat bed without using bedrails?: A Lot Help needed moving from lying on your back to sitting on the side of a flat bed without using bedrails?: A Lot Help needed  moving to and from a bed to a chair (including a wheelchair)?: A Lot Help needed standing up from a chair using your arms (e.g., wheelchair or bedside chair)?: A Lot Help needed to walk in hospital room?: Total Help needed climbing 3-5 steps with a railing? : Total 6 Click Score: 10    End of Session Equipment Utilized During Treatment: Gait belt Activity Tolerance: Patient tolerated treatment well;Patient limited by fatigue Patient left: in bed;with call bell/phone within reach;with bed alarm set;with nursing/sitter in room   Pain - Right/Left: Right Pain - part of body: Knee     Time: [id number] PT Time Calculation (min) (ACUTE ONLY): 23 min  Charges:  $Therapeutic Exercise: 8-22 mins $Therapeutic Activity: 8-22 mins                     Chesley Noon, PTA 12/02/18, 10:59 AM

## 2018-12-02 NOTE — Progress Notes (Signed)
OT Cancellation Note  Patient Details Name: Bobby Lane MRN: [id number] DOB: [birth year]   Cancelled Treatment:    Reason Eval/Treat Not Completed: Other (comment). Upon attempt, pt working with PTA. Will re-attempt OT treatment later date/time as pt is available and medically appropriate.   Jeni Salles, MPH, MS, OTR/L ascom [phone] 12/02/18, 10:28 AM

## 2018-12-03 LAB — BASIC METABOLIC PANEL
Anion gap: 8 (ref 5–15)
BUN: 18 mg/dL (ref 8–23)
CO2: 31 mmol/L (ref 22–32)
Calcium: 8.7 mg/dL — ABNORMAL LOW (ref 8.9–10.3)
Chloride: 98 mmol/L (ref 98–111)
Creatinine, Ser: 1.12 mg/dL (ref 0.61–1.24)
GFR calc Af Amer: >60 mL/min (ref >60)
GFR calc non Af Amer: >60 mL/min (ref >60)
Glucose, Bld: 96 mg/dL (ref 70–99)
Potassium: 4.2 mmol/L (ref 3.5–5.1)
Sodium: 137 mmol/L (ref 135–145)

## 2018-12-03 NOTE — Progress Notes (Signed)
[address] at [facility]                                                                                                                                                                                  Patient Demographics   Bobby Lane, is a 78 y.o. male, DOB - 06/12/1940, [id number]  Admit date - 11/19/2018   Admitting Physician Harrie Foreman, MD  Outpatient Primary MD for the patient is Danelle Berry, NP   LOS - 14  Subjective: Patient continues to be very weak diarrhea is resolved.  Review of Systems:   CONSTITUTIONAL: No documented fever.  Positive fatigue Positive weakness  kness. No weight gain, no weight loss.  EYES: No blurry or double vision.  ENT: No tinnitus. No postnasal drip. No redness of the oropharynx.  RESPIRATORY: No cough, no wheeze, no hemoptysis. No dyspnea.  CARDIOVASCULAR: No chest pain. No orthopnea. No palpitations. No syncope.  GASTROINTESTINAL: No nausea, no vomiting or diarrhea. No abdominal pain. No melena or hematochezia.  GENITOURINARY: No dysuria or hematuria.  ENDOCRINE: No polyuria or nocturia. No heat or cold intolerance.  HEMATOLOGY: No anemia. No bruising. No bleeding.  INTEGUMENTARY: No rashes. No lesions.  MUSCULOSKELETAL: No arthritis. No swelling. No gout.  NEUROLOGIC: No numbness, tingling, or ataxia. No seizure-type activity.  PSYCHIATRIC: No anxiety. No insomnia. No ADD.    Vitals:   Vitals:   12/02/18 1841 12/02/18 2038 12/03/18 0500 12/03/18 0626  BP: (!) 83/56 106/64  139/83  Pulse: 71 69  80  Resp: 20     Temp: 98.2 F (36.8 C) (!) 97.4 F (36.3 C)  97.8 F (36.6 C)  TempSrc: Oral Oral  Oral  SpO2: 93% 93%  96%  Weight:   86.6 kg   Height:        Wt Readings from Last 3 Encounters:  12/03/18 86.6 kg  11/13/18 92.5 kg  04/10/18 100.7 kg     Intake/Output Summary (Last 24 hours) at 12/03/2018 1124 Last data filed at 12/02/2018 1846 Gross per 24 hour  Intake -  Output 1200  ml  Net -1200 ml    Physical Exam:   GENERAL: Pleasant-appearing in no apparent distress.  HEAD, EYES, EARS, NOSE AND THROAT: Atraumatic, normocephalic. Extraocular muscles are intact. Pupils equal and reactive to light. Sclerae anicteric. No conjunctival injection. No oro-pharyngeal erythema.  NECK: Supple. There is no jugular venous distention. No bruits, no lymphadenopathy, no thyromegaly.  HEART: Regular rate and rhythm,. No murmurs, no rubs, no clicks.  LUNGS: Clear to auscultation bilaterally. No rales or rhonchi. No wheezes.  ABDOMEN: Soft, flat, nontender, nondistended. Has good bowel sounds. No hepatosplenomegaly appreciated.  EXTREMITIES: No evidence of any cyanosis, clubbing, or positive peripheral edema.  +2 pedal and radial pulses bilaterally.  NEUROLOGIC: The patient is alert, awake, and oriented x3 with no focal motor or sensory deficits appreciated bilaterally.  SKIN: Moist and warm with no rashes appreciated.  Psych: Not anxious, depressed LN: No inguinal LN enlargement    Antibiotics   Anti-infectives (From admission, onward)   Start     Dose/Rate Route Frequency Ordered Stop   11/27/18 1200  vancomycin (VANCOCIN) 50 mg/mL oral solution 125 mg     125 mg Oral Every 6 hours 11/27/18 1050 12/05/18 1159   11/22/18 1200  vancomycin (VANCOCIN) 500 mg in sodium chloride irrigation 0.9 % 100 mL ENEMA  Status:  Discontinued     500 mg Rectal Every 6 hours 11/22/18 1147 11/25/18 1607   11/21/18 2000  metroNIDAZOLE (FLAGYL) IVPB 500 mg     500 mg 100 mL/hr over 60 Minutes Intravenous Every 8 hours 11/21/18 1816 11/27/18 1743   11/21/18 1400  metroNIDAZOLE (FLAGYL) IVPB 500 mg  Status:  Discontinued     500 mg 100 mL/hr over 60 Minutes Intravenous Every 8 hours 11/21/18 1347 11/21/18 1618   11/21/18 1400  vancomycin (VANCOCIN) 50 mg/mL oral solution 500 mg  Status:  Discontinued     500 mg Oral Every 6 hours 11/21/18 1347 11/21/18 1357   11/21/18 1400  metroNIDAZOLE (FLAGYL)  IVPB 500 mg  Status:  Discontinued     500 mg 100 mL/hr over 60 Minutes Intravenous Every 8 hours 11/21/18 1347 11/21/18 1347   11/21/18 1400  vancomycin (VANCOCIN) 50 mg/mL oral solution 500 mg  Status:  Discontinued     500 mg Oral Every 6 hours 11/21/18 1358 11/27/18 1050   11/20/18 2000  vancomycin (VANCOCIN) IVPB 1000 mg/200 mL premix  Status:  Discontinued     1,000 mg 200 mL/hr over 60 Minutes Intravenous Every 24 hours 11/20/18 1448 11/21/18 0746   11/20/18 1600  metroNIDAZOLE (FLAGYL) IVPB 500 mg  Status:  Discontinued     500 mg 100 mL/hr over 60 Minutes Intravenous Every 8 hours 11/20/18 1507 11/20/18 1626   11/20/18 1515  piperacillin-tazobactam (ZOSYN) IVPB 3.375 g  Status:  Discontinued     3.375 g 12.5 mL/hr over 240 Minutes Intravenous Every 8 hours 11/20/18 1507 11/21/18 1730   11/19/18 1930  vancomycin (VANCOCIN) 1,250 mg in sodium chloride 0.9 % 250 mL IVPB     1,250 mg 166.7 mL/hr over 90 Minutes Intravenous  Once 11/19/18 1901 11/19/18 2211   11/19/18 1800  vancomycin (VANCOCIN) 1,250 mg in sodium chloride 0.9 % 250 mL IVPB  Status:  Discontinued     1,250 mg 166.7 mL/hr over 90 Minutes Intravenous  Once 11/19/18 1734 11/19/18 1901   11/19/18 1600  ceFEPIme (MAXIPIME) 2 g in sodium chloride 0.9 % 100 mL IVPB  Status:  Discontinued     2 g 200 mL/hr over 30 Minutes Intravenous Every 12 hours 11/19/18 0634 11/20/18 1507   11/19/18 1400  metroNIDAZOLE (FLAGYL) IVPB 500 mg  Status:  Discontinued     500 mg 100 mL/hr over 60 Minutes Intravenous Every 8 hours 11/19/18 0634 11/20/18 0924   11/19/18 1204  vancomycin variable dose per unstable renal function (pharmacist dosing)  Status:  Discontinued      Does not apply See admin instructions 11/19/18 1204 11/20/18 1447   11/19/18 0415  vancomycin (VANCOCIN) 2,000 mg in sodium chloride 0.9 % 500 mL IVPB  2,000 mg 250 mL/hr over 120 Minutes Intravenous  Once 11/19/18 0405 11/19/18 0733   11/19/18 0400  ceFEPIme (MAXIPIME)  2 g in sodium chloride 0.9 % 100 mL IVPB     2 g 200 mL/hr over 30 Minutes Intravenous  Once 11/19/18 0357 11/19/18 0511   11/19/18 0400  metroNIDAZOLE (FLAGYL) IVPB 500 mg     500 mg 100 mL/hr over 60 Minutes Intravenous  Once 11/19/18 0357 11/19/18 0548   11/19/18 0400  vancomycin (VANCOCIN) IVPB 1000 mg/200 mL premix  Status:  Discontinued     1,000 mg 200 mL/hr over 60 Minutes Intravenous  Once 11/19/18 0357 11/19/18 0405      Medications   Scheduled Meds: . acetaminophen  650 mg Oral Q8H   Or  . acetaminophen  650 mg Rectal Q8H  . allopurinol  300 mg Oral Daily  . Chlorhexidine Gluconate Cloth  6 each Topical Daily  . cloNIDine  0.1 mg Oral BID  . DULoxetine  30 mg Oral BID  . furosemide  40 mg Oral Daily  . heparin  5,000 Units Subcutaneous Q8H  . levothyroxine  100 mcg Oral QAC breakfast  . losartan  100 mg Oral Daily  . metoprolol succinate  100 mg Oral Daily  . multivitamin with minerals  1 tablet Oral Daily  . Ensure Max Protein  11 oz Oral BID  . rosuvastatin  5 mg Oral q1800  . simethicone  40 mg Oral TID  . spironolactone  25 mg Oral Daily  . tamsulosin  0.4 mg Oral Daily  . terbinafine   Topical BID  . vancomycin  125 mg Oral Q6H  . vitamin C  250 mg Oral BID   Continuous Infusions: PRN Meds:.albuterol, liver oil-zinc oxide, ondansetron **OR** ondansetron (ZOFRAN) IV, phenol, sodium chloride   Data Review:   Micro Results Recent Results (from the past 240 hour(s))  Novel Coronavirus, NAA (Hosp order, Send-out to Ref Lab; TAT 18-24 hrs     Status: None   Collection Time: 11/27/18  4:48 PM   Specimen: Nasopharyngeal Swab; Respiratory  Result Value Ref Range Status   SARS-CoV-2, NAA NOT DETECTED NOT DETECTED Final    Comment: (NOTE) This nucleic acid amplification test was developed and its performance characteristics determined by [employer]. Nucleic acid amplification tests include PCR and TMA. This test has not been FDA cleared or  approved. This test has been authorized by FDA under an Emergency Use Authorization (EUA). This test is only authorized for the duration of time the declaration that circumstances exist justifying the authorization of the emergency use of in vitro diagnostic tests for detection of SARS-CoV-2 virus and/or diagnosis of COVID-19 infection under section 564(b)(1) of the Act, 21 U.S.C. [id number]) (1), unless the authorization is terminated or revoked sooner. When diagnostic testing is negative, the possibility of a false negative result should be considered in the context of a patient's recent exposures and the presence of clinical signs and symptoms consistent with COVID-19. An individual without symptoms of COVID- 19 and who is not shedding SARS-CoV-2 vi rus would expect to have a negative (not detected) result in this assay. Performed At: [facility] [place], [place] [id number] Rush Farmer MD [id number]    [place]  Final    Comment: Performed at [facility], [address] [place], [place] [address]    Radiology Reports Ct Abdomen Pelvis Wo Contrast  Result Date: 11/20/2018 CLINICAL DATA:  Abdominal pain, sepsis, elevated lactic  acid, leukocytosis EXAM: CT ABDOMEN AND PELVIS WITHOUT CONTRAST TECHNIQUE: Multidetector CT imaging of the abdomen and pelvis was performed following the standard protocol without IV contrast. COMPARISON:  09/03/2013 FINDINGS: Lower chest: Trace bilateral pleural effusions. Coronary artery calcifications. Hepatobiliary: Coarse contour of the liver. No gallstones, gallbladder wall thickening, or biliary dilatation. Pancreas: Unremarkable. No pancreatic ductal dilatation or surrounding inflammatory changes. Spleen: Normal in size without significant abnormality. Adrenals/Urinary Tract: Adrenal glands are unremarkable. Numerous tiny nonobstructive bilateral renal calculi. No hydronephrosis. Simple cyst  of the inferior pole of the right kidney. Bladder is decompressed by Foley catheter. Stomach/Bowel: Stomach is within normal limits. The appendix is not clearly visualized and may be surgically absent. The colon is diffusely distended with wall thickening and adjacent fat stranding, containing fluid to the rectum. Vascular/Lymphatic: Aortic atherosclerosis. No enlarged abdominal or pelvic lymph nodes. Reproductive: No mass or other significant abnormality. Other: Fat and fluid containing right inguinal hernia (series 5, image 38). Trace ascites. 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 IMPRESSION: 1. The colon is diffusely distended with wall thickening and adjacent fat stranding, containing fluid to the rectum. Findings are consistent with nonspecific infectious, inflammatory, or ischemic colitis. There are no particular findings to suggest ischemia such as pneumatosis or portal venous gas. 2.  Trace ascites in the abdomen and pelvis. 3. Coarse contour of the liver, suggesting cirrhosis, however without definitive morphologic stigmata such as splenomegaly or varices. 4.  Nonobstructive bilateral nephrolithiasis. 5.  Aortic Atherosclerosis (ICD10-I70.0). Electronically Signed   By: Eddie Candle M.D.   On: 11/20/2018 15:00   Dg Forearm Right  Result Date: 11/20/2018 CLINICAL DATA:  Right arm pain after fall. EXAM: RIGHT FOREARM - 2 VIEW COMPARISON:  Right forearm x-rays dated May 02, 2010. FINDINGS: There is no evidence of fracture or other focal bone lesions. Slight cortical irregularity of the radial neck is chronic and unchanged since 2012. Soft tissues are unremarkable. IMPRESSION: No acute osseous abnormality. Electronically Signed   By: Titus Dubin M.D.   On: 11/20/2018 15:12   Dg Wrist Complete Right  Result Date: 11/20/2018 CLINICAL DATA:  Wrist pain after fall. EXAM: RIGHT WRIST - COMPLETE 3+ VIEW COMPARISON:  Right forearm x-rays dated May 02, 2010. Right hand  x-rays dated April 26, 2010. FINDINGS: There is no evidence of fracture or dislocation. There is no evidence of arthropathy or other focal bone abnormality. Soft tissues are unremarkable. 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 IMPRESSION: 1.  No acute osseous abnormality. Electronically Signed   By: Titus Dubin M.D.   On: 11/20/2018 15:09   Dg Abd 1 View  Result Date: 11/23/2018 CLINICAL DATA:  Severe cdiff colitis with colonic distension. Eval for ileus. Pt actively having episodes of diarrhea during the exam. EXAM: ABDOMEN - 1 VIEW COMPARISON:  Abdominal radiograph 11/22/2018 FINDINGS: There are dilated loops of small and large bowel throughout the abdomen likely representing diffuse ileus. The transverse colon is significantly dilated measuring 9 cm in diameter. Health stromal markings are maintained. There is evidence of thumbprinting likely from mucosal edema. No evidence for free air on supine view. No acute finding in the visualized skeleton. IMPRESSION: Findings consistent with diffuse ileus. No gross free air. 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 Electronically Signed   By: Audie Pinto M.D.   On: 11/23/2018 10:15   Dg Abd 1 View  Result Date: 11/22/2018 CLINICAL DATA:  Abdominal distension. EXAM: ABDOMEN -  1 VIEW COMPARISON:  May 21, 2017. FINDINGS: Dilated air-filled colon is noted. No definite small bowel dilatation is noted. No abnormal calcifications are noted. IMPRESSION: Dilated air-filled colon is noted most consistent with ileus. Electronically Signed   By: Marijo Conception M.D.   On: 11/22/2018 12:45   Ct Angio Chest Pe W/cm &/or Wo Cm  Result Date: 11/19/2018 CLINICAL DATA:  Hypoxia with new onset atrial fibrillation.  Fever. EXAM: CT ANGIOGRAPHY CHEST WITH CONTRAST TECHNIQUE: Multidetector CT imaging of the chest was performed using the standard  protocol during bolus administration of intravenous contrast. Multiplanar CT image reconstructions and MIPs were obtained to evaluate the vascular anatomy. CONTRAST:  53mL OMNIPAQUE IOHEXOL 350 MG/ML SOLN COMPARISON:  None. FINDINGS: Cardiovascular: Limited study primarily due to bolus dispersion and intermittent respiratory motion. No evidence of central or lobar pulmonary embolism. No acute aortic finding. There is multifocal atherosclerotic calcification of the aorta and coronaries. Normal heart size. No pericardial effusion Mediastinum/Nodes: Negative for adenopathy or mass Lungs/Pleura: There is no edema, consolidation, effusion, or pneumothorax. Mild dependent atelectasis. Upper Abdomen: Negative Musculoskeletal: Spondylosis and generalized degenerative disease. 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 Osteopenia Review of the MIP images confirms the above findings. IMPRESSION: 1. Very limited pulmonary artery evaluation. No evidence of main or lobar pulmonary embolism. 2. Aortic and coronary atherosclerosis. Electronically Signed   By: Monte Fantasia M.D.   On: 11/19/2018 06:08   Dg Chest Port 1 View  Result Date: 11/19/2018 CLINICAL DATA:  Hypoxia EXAM: PORTABLE CHEST 1 VIEW COMPARISON:  Six days ago FINDINGS: Normal heart size and mediastinal contours. There is no edema, consolidation, effusion, or pneumothorax. No acute osseous finding. Severe glenohumeral osteoarthritis. IMPRESSION: No evidence of active disease. Electronically Signed   By: Monte Fantasia M.D.   On: 11/19/2018 04:50   Dg Chest Port 1 View  Result Date: 11/13/2018 CLINICAL DATA:  Fever. Weakness. EXAM: PORTABLE CHEST 1 VIEW COMPARISON:  Radiograph 11/12/2017 FINDINGS: The cardiomediastinal contours are normal. Atherosclerosis of the aortic arch. Subsegmental atelectasis at the bases. Pulmonary vasculature is normal. Questionable blunting of left costophrenic angle. No pneumothorax. No acute osseous abnormalities are  seen. IMPRESSION: 1. Subsegmental atelectasis at the bases. 2. Possible small left pleural effusion. Electronically Signed   By: Keith Rake M.D.   On: 11/13/2018 23:43     CBC Recent Labs  Lab 11/27/18 0517 11/28/18 0643 11/29/18 0558  WBC 11.1* 9.4 8.8  HGB 10.8* 10.9* 10.9*  HCT 33.1* 33.6* 33.0*  PLT 272 274 269  MCV 104.4* 103.1* 103.1*  MCH 34.1* 33.4 34.1*  MCHC 32.6 32.4 33.0  RDW 14.3 14.2 14.5    Chemistries  Recent Labs  Lab 11/27/18 0517 11/27/18 0626 11/28/18 0643 11/29/18 0558 12/02/18 0348 12/03/18 0453  NA 139  --  137 137 137 137  K 3.6  --  3.4* 3.7 3.0* 4.2  CL 110  --  107 104 97* 98  CO2 21*  --  24 25 33* 31  GLUCOSE 92  --  91 91 100* 96  BUN 17  --  19 21 16 18   CREATININE 0.86  --  0.99 0.98 1.08 1.12  CALCIUM 7.9*  --  8.1* 8.2* 8.3* 8.7*  AST  --  15  --   --   --   --   ALT  --  11  --   --   --   --   ALKPHOS  --  46  --   --   --   --  BILITOT  --  0.5  --   --   --   --    ------------------------------------------------------------------------------------------------------------------ estimated creatinine clearance is 59 mL/min (by C-G formula based on SCr of 1.12 mg/dL). ------------------------------------------------------------------------------------------------------------------ No results for input(s): HGBA1C in the last 72 hours. ------------------------------------------------------------------------------------------------------------------ No results for input(s): CHOL, HDL, LDLCALC, TRIG, CHOLHDL, LDLDIRECT in the last 72 hours. ------------------------------------------------------------------------------------------------------------------ No results for input(s): TSH, T4TOTAL, T3FREE, THYROIDAB in the last 72 hours.  Invalid input(s): FREET3 ------------------------------------------------------------------------------------------------------------------ No results for input(s): VITAMINB12, FOLATE, FERRITIN,  TIBC, IRON, RETICCTPCT in the last 72 hours.  Coagulation profile No results for input(s): INR, PROTIME in the last 168 hours.  No results for input(s): DDIMER in the last 72 hours.  Cardiac Enzymes No results for input(s): CKMB, TROPONINI, MYOGLOBIN in the last 168 hours.  Invalid input(s): CK ------------------------------------------------------------------------------------------------------------------ Invalid input(s): [place]  This is a 77 year old male admitted for sepsis.  *Sepsis: present on admission  due to severe C. difficile colitis-clinically improving - vanco to 125mg  PO Q6 stop date 10/22 which is tomorrow   *Generalized anasarca this is due to severe hypoalbuminemia Patient on oral Lasix  *Acute on chronic kidney injury 2: ARF Now resolved, renal function back to baseline  *Hypertension: Controlled; continue losartan, metoprolol, clonidine and Spironolactone   *BPH: Continue tamsulosin.   * Hypothyroidism: continue Synthroid, normal TSH  * Right upper extremity pain -resolved, Right wrist and forearm x-rays with no acute findings    Patient is not on any IV medication patient is very weak and deconditioned in need of rehab.  Insurance has denied his rehab.  We are in the process of p.o. I have called the number the case manager has give have not heard back from any physician yet      Code Status Orders  (From admission, onward)         Start     Ordered   11/19/18 1248  Full code  Continuous     11/19/18 1247        Code Status History    This patient has a current code status but no historical code status.   Advance Care Planning Activity           Consults none   DVT Prophylaxis Heparin  Lab Results  Component Value Date   PLT 269 11/29/2018     Time Spent in minutes 35 minutes  Greater than 50% of time spent in care coordination and counseling patient regarding the condition and plan of  care.   Dustin Flock M.D on 12/03/2018 at 11:24 AM  Between 7am to 6pm - Pager - [phone]  After 6pm go to [email or web address] - Proofreader  [employer]   Office  [phone]

## 2018-12-03 NOTE — Progress Notes (Signed)
Occupational Therapy Treatment Patient Details Name: Bobby Lane MRN: [id number] DOB: March 09, 1940 Today's Date: 12/03/2018    History of present illness Pt is 78 y.o. male that presented to ED for AMS, recent history of UTI, falls, generalized weakness. Workup showed sepsis secondary to C difficile, no surgical intervention at this time. PMH of HOH, skin cancer, COPD, GERD, kidney stones, HTN, thyroid disease. Pt also complained of RUE pain, xray negative for acute findings.   OT comments  Pt seen by OT this date to address safety and independence with self care and ADL mobility. When OT presents for tx, pt requesting peri care assistance. OT engages pt in toileting/clothing mgt with lateral rolling technique at bed level with pt requiring MOD A for bed mobility aspect and MAX A for thorough completion of peri care. Pt requires MOD/MAX A for propulsion towards HOB afterward to optimize in bed positioning. Pt politely declines to engage in further bed mobility/self care/therapeutic activity citing fatigue at this time. SNF remains most appropriate d/c given very low fxl activity tolerance and low level of independence with ADL mobility.    Follow Up Recommendations  SNF    Equipment Recommendations  Other (comment)(TBD, defer to next level of care.)    Recommendations for Other Services      Precautions / Restrictions Precautions Precautions: Fall Precaution Comments: enteric Restrictions Weight Bearing Restrictions: No       Mobility Bed Mobility Overal bed mobility: Needs Assistance   Rolling: Max assist;Mod assist         General bed mobility comments: pt declines to participate in sup<>sit transition during this tx citing fatigue after participating in rolling bed mobility and peri care following slightly liquid BM at bed level.  Transfers                 General transfer comment: Deferred    Balance       Sitting balance - Comments: pt politely declines to  participate in EOB sitting during this tx time citing fatigue.       Standing balance comment: deferred                           ADL either performed or assessed with clinical judgement   ADL Overall ADL's : Needs assistance/impaired                             Toileting- Clothing Manipulation and Hygiene: Moderate assistance;Maximal assistance;Bed level Toileting - Clothing Manipulation Details (indicate cue type and reason): using lateral rolling technique for peri care with assist of one.             Vision Patient Visual Report: No change from baseline     Perception     Praxis      Cognition Arousal/Alertness: Awake/alert Behavior During Therapy: WFL for tasks assessed/performed Overall Cognitive Status: Within Functional Limits for tasks assessed                                 General Comments: some re-direction required intermittenly, difficult to assess if d/t cognition versus HOH        Exercises Other Exercises Other Exercises: OT facilitates pt education re: In bed UE exercise including pulling bedrails for bicep flexion, and pushing through b/l UEs at hip level with HOB elevated for tricep extension. Other  Exercises: OT educated pt in importance of OOB activity to prevent further opportunistic infection such as PNA. Pt verbalized understanding, but still declines to sit up at this time.   Shoulder Instructions       General Comments      Pertinent Vitals/ Pain       Pain Assessment: Faces Faces Pain Scale: Hurts little more Pain Location: R knee Pain Descriptors / Indicators: Aching Pain Intervention(s): Limited activity within patient's tolerance;Monitored during session  Home Living                                          Prior Functioning/Environment              Frequency  Min 1X/week        Progress Toward Goals  OT Goals(current goals can now be found in the care plan  section)  Progress towards OT goals: Progressing toward goals  Acute Rehab OT Goals Patient Stated Goal: to get better, return to PLOF OT Goal Formulation: With patient Time For Goal Achievement: 12/10/18 Potential to Achieve Goals: Good  Plan Discharge plan remains appropriate;Frequency remains appropriate    Co-evaluation                 AM-PAC OT "6 Clicks" Daily Activity     Outcome Measure   Help from another person eating meals?: A Lot Help from another person taking care of personal grooming?: A Lot Help from another person toileting, which includes using toliet, bedpan, or urinal?: A Lot Help from another person bathing (including washing, rinsing, drying)?: A Lot Help from another person to put on and taking off regular upper body clothing?: A Lot Help from another person to put on and taking off regular lower body clothing?: A Lot 6 Click Score: 12    End of Session    OT Visit Diagnosis: Other abnormalities of gait and mobility (R26.89);Muscle weakness (generalized) (M62.81);Pain Pain - Right/Left: Right Pain - part of body: Arm;Hand;Knee   Activity Tolerance Patient tolerated treatment well   Patient Left in bed;with call bell/phone within reach;with bed alarm set   Nurse Communication (called CNA to inform her that pt cleaned up, but needed chucks replaced.)        Time: [id number] OT Time Calculation (min): 48 min  Charges: OT General Charges $OT Visit: 1 Visit OT Treatments $Self Care/Home Management : 23-37 mins $Therapeutic Activity: 8-22 mins  Gerrianne Scale, MS, OTR/L ascom [phone] or [phone] 12/03/18, 5:26 PM

## 2018-12-03 NOTE — Progress Notes (Signed)
Attempted to assist pt in ambulation. Pt able to stand at beside with use of walker and assistance of nurse to rise to feet from bed. Upon standing pt demonstrated unsteady on feet rocking back and forth, pt leaned forward and placed head on cabinet to help add support, Pt then assisted by RN, myself, to return to seated position on bed.

## 2018-12-04 LAB — SARS CORONAVIRUS 2 (TAT 6-24 HRS): SARS Coronavirus 2: NEGATIVE

## 2018-12-04 MED ORDER — ADULT MULTIVITAMIN W/MINERALS CH: 1.0000 | ORAL_TABLET | Freq: Every day | ORAL | Status: DC

## 2018-12-04 MED ORDER — TERBINAFINE HCL 1 % EX CREA: TOPICAL_CREAM | Freq: Two times a day (BID) | CUTANEOUS | 0 refills | Status: DC

## 2018-12-04 MED ORDER — ENSURE MAX PROTEIN PO LIQD: 11.0000 [oz_av] | Freq: Two times a day (BID) | ORAL | Status: DC

## 2018-12-04 MED ORDER — SIMETHICONE 40 MG/0.6ML PO SUSP: 40.0000 mg | Freq: Three times a day (TID) | ORAL | 0 refills | Status: DC

## 2018-12-04 MED ORDER — ACETAMINOPHEN 325 MG PO TABS: 650.0000 mg | ORAL_TABLET | Freq: Three times a day (TID) | ORAL | Status: DC

## 2018-12-04 MED ORDER — CLONAZEPAM 0.5 MG PO TABS: 0.5000 mg | ORAL_TABLET | Freq: Every evening | ORAL | 0 refills | Status: DC | PRN

## 2018-12-04 MED ORDER — FUROSEMIDE 40 MG PO TABS: 40.0000 mg | ORAL_TABLET | Freq: Every day | ORAL | Status: DC

## 2018-12-04 NOTE — Discharge Summary (Signed)
[name] at [facility], 78 y.o., DOB Jan 14, 1941, MRN [id number]. Admission date: 11/19/2018 Discharge Date 12/04/2018 Primary MD Danelle Berry, NP Admitting Physician Harrie Foreman, MD  Admission Diagnosis  Weakness generalized [R53.1] Hypoxia [R09.02] Atrial fibrillation with rapid ventricular response (HCC) [I48.91] Fever, unspecified fever cause [R50.9] Sepsis, due to unspecified organism, unspecified whether acute organ dysfunction present [facility]) [A41.9]  Discharge Diagnosis   Active Problems: Sepsis present on admission due to C. difficile colitis Generalized anasarca due to severehypoalbuminemia and fluid overload Acute on chronic kidney disease stage II Essential hypertension BPH Hypothyroidism Generalized weakness and deconditioning COPD without [name] is a 78 y.o.male  with a history of HTN, COPD, CAD, renal calculi, BPH presented to the ED on 11/18/2020 by EMS. HE was falling at home and was very weak with poor appetite and his wife called EMS. Pt was recently in ED on 10/1 with incontinence and fever of 100.6. He ws treated for UTI with Iv ceftriaxone and sent home on PO keflex- on 10/6 he was called and antibiotic changed to amoxicillin because of enterococcus in the urine. On admission wbc 26, temp of 100.5. Bladder scan showed 600cc urine and a foley was inserted .blood culture and urine culture sent- UA normal. Started vanco and cefepime. The latter was changed to zosyn on 11/20/18. Pt was obtunded and complaining of abdominal pain. As WBC continued to be high he had a CT scan abdomen  showed dilated loop of colon with thickening. He was seen by GI today and started empirically on vanco .  Subsequent stool test showed C. difficile.  Patient's diarrhea is has resolved.  He has completed a course of vancomycin.  Patient also was noted to have anasarca.  And his albumin was very low.  He will  receive albumin and IV Lasix with resolution of his anasarca.  Patient currently has very deconditioned need of rehab.  Recommend checking a BMP in 5 days due to patient being on new Lasix.        Consults  ID, GI  Significant Tests:  See full reports for all details    Ct Abdomen Pelvis Wo Contrast  Result Date: 11/20/2018 CLINICAL DATA:  Abdominal pain, sepsis, elevated lactic acid, leukocytosis EXAM: CT ABDOMEN AND PELVIS WITHOUT CONTRAST TECHNIQUE: Multidetector CT imaging of the abdomen and pelvis was performed following the standard protocol without IV contrast. COMPARISON:  09/03/2013 FINDINGS: Lower chest: Trace bilateral pleural effusions. Coronary artery calcifications. Hepatobiliary: Coarse contour of the liver. No gallstones, gallbladder wall thickening, or biliary dilatation. Pancreas: Unremarkable. No pancreatic ductal dilatation or surrounding inflammatory changes. Spleen: Normal in size without significant abnormality. Adrenals/Urinary Tract: Adrenal glands are unremarkable. Numerous tiny nonobstructive bilateral renal calculi. No hydronephrosis. Simple cyst of the inferior pole of the right kidney. Bladder is decompressed by Foley catheter. Stomach/Bowel: Stomach is within normal limits. The appendix is not clearly visualized and may be surgically absent. The colon is diffusely distended with wall thickening and adjacent fat stranding, containing fluid to the rectum. Vascular/Lymphatic: Aortic atherosclerosis. No enlarged abdominal or pelvic lymph nodes. Reproductive: No mass or other significant abnormality. Other: Fat and fluid containing right inguinal hernia (series 5, image 38). Trace ascites. 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 IMPRESSION: 1. The colon is diffusely distended with wall thickening and adjacent fat stranding, containing fluid to the rectum. Findings are consistent with nonspecific infectious, inflammatory, or ischemic colitis.  There  are no particular findings to suggest ischemia such as pneumatosis or portal venous gas. 2.  Trace ascites in the abdomen and pelvis. 3. Coarse contour of the liver, suggesting cirrhosis, however without definitive morphologic stigmata such as splenomegaly or varices. 4.  Nonobstructive bilateral nephrolithiasis. 5.  Aortic Atherosclerosis (ICD10-I70.0). Electronically Signed   By: Eddie Candle M.D.   On: 11/20/2018 15:00   Dg Forearm Right  Result Date: 11/20/2018 CLINICAL DATA:  Right arm pain after fall. EXAM: RIGHT FOREARM - 2 VIEW COMPARISON:  Right forearm x-rays dated May 02, 2010. FINDINGS: There is no evidence of fracture or other focal bone lesions. Slight cortical irregularity of the radial neck is chronic and unchanged since 2012. Soft tissues are unremarkable. IMPRESSION: No acute osseous abnormality. Electronically Signed   By: Titus Dubin M.D.   On: 11/20/2018 15:12   Dg Wrist Complete Right  Result Date: 11/20/2018 CLINICAL DATA:  Wrist pain after fall. EXAM: RIGHT WRIST - COMPLETE 3+ VIEW COMPARISON:  Right forearm x-rays dated May 02, 2010. Right hand x-rays dated April 26, 2010. FINDINGS: There is no evidence of fracture or dislocation. There is no evidence of arthropathy or other focal bone abnormality. Soft tissues are unremarkable. 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 IMPRESSION: 1.  No acute osseous abnormality. Electronically Signed   By: Titus Dubin M.D.   On: 11/20/2018 15:09   Dg Abd 1 View  Result Date: 11/23/2018 CLINICAL DATA:  Severe cdiff colitis with colonic distension. Eval for ileus. Pt actively having episodes of diarrhea during the exam. EXAM: ABDOMEN - 1 VIEW COMPARISON:  Abdominal radiograph 11/22/2018 FINDINGS: There are dilated loops of small and large bowel throughout the abdomen likely representing diffuse ileus. The transverse colon is significantly dilated measuring 9 cm in diameter. Health stromal  markings are maintained. There is evidence of thumbprinting likely from mucosal edema. No evidence for free air on supine view. No acute finding in the visualized skeleton. IMPRESSION: Findings consistent with diffuse ileus. No gross free air. 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 Electronically Signed   By: Audie Pinto M.D.   On: 11/23/2018 10:15   Dg Abd 1 View  Result Date: 11/22/2018 CLINICAL DATA:  Abdominal distension. EXAM: ABDOMEN - 1 VIEW COMPARISON:  May 21, 2017. FINDINGS: Dilated air-filled colon is noted. No definite small bowel dilatation is noted. No abnormal calcifications are noted. IMPRESSION: Dilated air-filled colon is noted most consistent with ileus. Electronically Signed   By: Marijo Conception M.D.   On: 11/22/2018 12:45   Ct Angio Chest Pe W/cm &/or Wo Cm  Result Date: 11/19/2018 CLINICAL DATA:  Hypoxia with new onset atrial fibrillation.  Fever. EXAM: CT ANGIOGRAPHY CHEST WITH CONTRAST TECHNIQUE: Multidetector CT imaging of the chest was performed using the standard protocol during bolus administration of intravenous contrast. Multiplanar CT image reconstructions and MIPs were obtained to evaluate the vascular anatomy. CONTRAST:  77mL OMNIPAQUE IOHEXOL 350 MG/ML SOLN COMPARISON:  None. FINDINGS: Cardiovascular: Limited study primarily due to bolus dispersion and intermittent respiratory motion. No evidence of central or lobar pulmonary embolism. No acute aortic finding. There is multifocal atherosclerotic calcification of the aorta and coronaries. Normal heart size. No pericardial effusion Mediastinum/Nodes: Negative for adenopathy or mass Lungs/Pleura: There is no edema, consolidation, effusion, or pneumothorax. Mild dependent atelectasis. Upper Abdomen: Negative Musculoskeletal: Spondylosis and generalized degenerative disease. 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  Osteopenia Review of the MIP images  confirms the above findings. IMPRESSION: 1. Very limited pulmonary artery evaluation. No evidence of main or lobar pulmonary embolism. 2. Aortic and coronary atherosclerosis. Electronically Signed   By: Monte Fantasia M.D.   On: 11/19/2018 06:08   Dg Chest Port 1 View  Result Date: 11/19/2018 CLINICAL DATA:  Hypoxia EXAM: PORTABLE CHEST 1 VIEW COMPARISON:  Six days ago FINDINGS: Normal heart size and mediastinal contours. There is no edema, consolidation, effusion, or pneumothorax. No acute osseous finding. Severe glenohumeral osteoarthritis. IMPRESSION: No evidence of active disease. Electronically Signed   By: Monte Fantasia M.D.   On: 11/19/2018 04:50   Dg Chest Port 1 View  Result Date: 11/13/2018 CLINICAL DATA:  Fever. Weakness. EXAM: PORTABLE CHEST 1 VIEW COMPARISON:  Radiograph 11/12/2017 FINDINGS: The cardiomediastinal contours are normal. Atherosclerosis of the aortic arch. Subsegmental atelectasis at the bases. Pulmonary vasculature is normal. Questionable blunting of left costophrenic angle. No pneumothorax. No acute osseous abnormalities are seen. IMPRESSION: 1. Subsegmental atelectasis at the bases. 2. Possible small left pleural effusion. Electronically Signed   By: Keith Rake M.D.   On: 11/13/2018 23:43       Today   Subjective:   Bobby Lane patient doing well denies any complaints  Objective:   Blood pressure 111/67, pulse 70, temperature (!) 97.4 F (36.3 C), temperature source Oral, resp. rate 17, height 5\' 7"  (1.702 m), weight 86 kg, SpO2 98 %.  .  Intake/Output Summary (Last 24 hours) at 12/04/2018 1022 Last data filed at 12/03/2018 1710 Gross per 24 hour  Intake -  Output 750 ml  Net -750 ml    Exam VITAL SIGNS: Blood pressure 111/67, pulse 70, temperature (!) 97.4 F (36.3 C), temperature source Oral, resp. rate 17, height 5\' 7"  (1.702 m), weight 86 kg, SpO2 98 %.  GENERAL:  78 y.o.-year-old patient lying in the  bed with no acute distress.  EYES: Pupils equal, round, reactive to light and accommodation. No scleral icterus. Extraocular muscles intact.  HEENT: Head atraumatic, normocephalic. Oropharynx and nasopharynx clear.  NECK:  Supple, no jugular venous distention. No thyroid enlargement, no tenderness.  LUNGS: Normal breath sounds bilaterally, no wheezing, rales,rhonchi or crepitation. No use of accessory muscles of respiration.  CARDIOVASCULAR: S1, S2 normal. No murmurs, rubs, or gallops.  ABDOMEN: Soft, nontender, nondistended. Bowel sounds present. No organomegaly or mass.  EXTREMITIES: No pedal edema, cyanosis, or clubbing.  NEUROLOGIC: Cranial nerves II through XII are intact. Muscle strength 5/5 in all extremities. Sensation intact. Gait not checked.  PSYCHIATRIC: The patient is alert and oriented x 3.  SKIN: No obvious rash, lesion, or ulcer.   Data Review     CBC w Diff:  Lab Results  Component Value Date   WBC 8.8 11/29/2018   HGB 10.9 (L) 11/29/2018   HGB 15.0 09/02/2013   HCT 33.0 (L) 11/29/2018   HCT 45.0 09/02/2013   PLT 269 11/29/2018   PLT 204 09/02/2013   LYMPHOPCT 4 11/22/2018   LYMPHOPCT 31.0 09/02/2013   MONOPCT 4 11/22/2018   MONOPCT 8.4 09/02/2013   EOSPCT 0 11/22/2018   EOSPCT 1.7 09/02/2013   BASOPCT 0 11/22/2018   BASOPCT 0.7 09/02/2013   CMP:  Lab Results  Component Value Date   NA 137 12/03/2018   NA 141 09/02/2013   K 4.2 12/03/2018   K 4.5 09/02/2013   CL 98 12/03/2018   CL 105 09/02/2013   CO2 31 12/03/2018   CO2 29 09/02/2013   BUN 18 12/03/2018  BUN 30 (H) 09/02/2013   CREATININE 1.12 12/03/2018   CREATININE 1.53 (H) 09/02/2013   PROT 4.4 (L) 11/27/2018   PROT 7.4 09/02/2013   ALBUMIN 1.8 (L) 11/27/2018   ALBUMIN 4.0 09/02/2013   BILITOT 0.5 11/27/2018   BILITOT 0.4 09/02/2013   ALKPHOS 46 11/27/2018   ALKPHOS 61 09/02/2013   AST 15 11/27/2018   AST 31 09/02/2013   ALT 11 11/27/2018   ALT 46 09/02/2013  .  Micro  Results Recent Results (from the past 240 hour(s))  Novel Coronavirus, NAA (Hosp order, Send-out to Ref Lab; TAT 18-24 hrs     Status: None   Collection Time: 11/27/18  4:48 PM   Specimen: Nasopharyngeal Swab; Respiratory  Result Value Ref Range Status   SARS-CoV-2, NAA NOT DETECTED NOT DETECTED Final    Comment: (NOTE) This nucleic acid amplification test was developed and its performance characteristics determined by [employer]. Nucleic acid amplification tests include PCR and TMA. This test has not been FDA cleared or approved. This test has been authorized by FDA under an Emergency Use Authorization (EUA). This test is only authorized for the duration of time the declaration that circumstances exist justifying the authorization of the emergency use of in vitro diagnostic tests for detection of SARS-CoV-2 virus and/or diagnosis of COVID-19 infection under section 564(b)(1) of the Act, 21 U.S.C. [id number]) (1), unless the authorization is terminated or revoked sooner. When diagnostic testing is negative, the possibility of a false negative result should be considered in the context of a patient's recent exposures and the presence of clinical signs and symptoms consistent with COVID-19. An individual without symptoms of COVID- 19 and who is not shedding SARS-CoV-2 vi rus would expect to have a negative (not detected) result in this assay. Performed At: [facility] [address] [place], [place] [id number] Rush Farmer MD [id number]    [place]  Final    Comment: Performed at [facility], [place], [place] [id number]  SARS CORONAVIRUS 2 (TAT 6-24 HRS) Nasopharyngeal Nasopharyngeal Swab     Status: None   Collection Time: 12/03/18  6:40 PM   Specimen: Nasopharyngeal Swab  Result Value Ref Range Status   SARS Coronavirus 2 NEGATIVE NEGATIVE Final    Comment: (NOTE) SARS-CoV-2 target nucleic acids are NOT  DETECTED. The SARS-CoV-2 RNA is generally detectable in upper and lower respiratory specimens during the acute phase of infection. Negative results do not preclude SARS-CoV-2 infection, do not rule out co-infections with other pathogens, and should not be used as the sole basis for treatment or other patient management decisions. Negative results must be combined with clinical observations, patient history, and epidemiological information. The expected result is Negative. Fact Sheet for Patients: SugarRoll.be Fact Sheet for Healthcare Providers: https://www.woods-mathews.com/ This test is not yet approved or cleared by the [place] FDA and  has been authorized for detection and/or diagnosis of SARS-CoV-2 by FDA under an Emergency Use Authorization (EUA). This EUA will remain  in effect (meaning this test can be used) for the duration of the COVID-19 declaration under Section 56 4(b)(1) of the Act, 21 U.S.C. section 360bbb-3(b)(1), unless the authorization is terminated or revoked sooner. Performed at [facility], [place] [address] [place], [place] [address]         Code Status Orders  (From admission, onward)         Start     Ordered   11/19/18 1248  Full code  Continuous  11/19/18 1247        Code Status History    This patient has a current code status but no historical code status.   Advance Care Planning Activity          Contact information for after-discharge care    [facility] .   Service: Skilled Nursing Contact information: [place] [phone]              Discharge Medications   Allergies as of 12/04/2018      Reactions   Codeine Nausea And Vomiting   Morphine And Related Nausea And Vomiting   Novocain [procaine] Hives   NO TROUBLE IN OR   Other Nausea And Vomiting   Percocet  [oxycodone-acetaminophen] Nausea And Vomiting   Sulfa Antibiotics Hives   Tramadol Nausea And Vomiting   Note: upset stomach Note: upset stomach      Medication List    STOP taking these medications   amoxicillin-clavulanate 875-125 MG tablet Commonly known as: AUGMENTIN   metroNIDAZOLE 500 MG tablet Commonly known as: FLAGYL   sodium citrate-citric acid 500-334 MG/5ML solution Commonly known as: ORACIT   valACYclovir 1000 MG tablet Commonly known as: VALTREX     TAKE these medications   acetaminophen 325 MG tablet Commonly known as: TYLENOL Take 2 tablets (650 mg total) by mouth every 8 (eight) hours.   allopurinol 300 MG tablet Commonly known as: ZYLOPRIM Take 300 mg by mouth daily.   cetirizine 10 MG tablet Commonly known as: ZYRTEC Take 10 mg by mouth at bedtime.   clonazePAM 0.5 MG tablet Commonly known as: KLONOPIN Take 1 tablet (0.5 mg total) by mouth at bedtime as needed for anxiety.   cloNIDine 0.2 MG tablet Commonly known as: CATAPRES Take 0.1 mg by mouth 2 (two) times daily.   DULoxetine 30 MG capsule Commonly known as: CYMBALTA Take 30 mg by mouth 2 (two) times daily.   Ensure Max Protein Liqd Take 330 mLs (11 oz total) by mouth 2 (two) times daily.   furosemide 40 MG tablet Commonly known as: LASIX Take 1 tablet (40 mg total) by mouth daily. Start taking on: December 05, 2018   losartan 100 MG tablet Commonly known as: COZAAR Take 100 mg by mouth daily.   metoprolol succinate 100 MG 24 hr tablet Commonly known as: TOPROL-XL Take 100 mg by mouth daily.   multivitamin with minerals Tabs tablet Take 1 tablet by mouth daily. Start taking on: December 05, 2018   pantoprazole 40 MG tablet Commonly known as: PROTONIX Take 40 mg by mouth daily.   rosuvastatin 5 MG tablet Commonly known as: CRESTOR Take 5 mg by mouth every other day.   simethicone 40 MG/0.6ML drops Commonly known as: MYLICON Take 0.6 mLs (40 mg total) by mouth 3 (three)  times daily.   spironolactone 25 MG tablet Commonly known as: ALDACTONE Take 25 mg by mouth daily.   sucralfate 1 g tablet Commonly known as: CARAFATE Take 1 g by mouth 4 (four) times daily.   Synthroid 100 MCG tablet Generic drug: levothyroxine Take 100 mcg by mouth daily before breakfast.   tamsulosin 0.4 MG Caps capsule Commonly known as: FLOMAX Take 1 capsule (0.4 mg total) by mouth daily.   terbinafine 1 % cream Commonly known as: LAMISIL Apply topically 2 (two) times daily.   Vitamin D3 125 MCG (5000 UT) Tabs Take 5,000 Units by mouth daily.  Total Time in preparing paper work, data evaluation and todays exam - 59 minutes  Dustin Flock M.D on 12/04/2018 at 10:22 AM [place]  [phone]

## 2018-12-04 NOTE — Care Management Important Message (Signed)
Important Message  Patient Details  Name: ALADDIN BOSMA MRN: [id number] Date of Birth: 1940/07/22   Medicare Important Message Given:  Yes     Candie Chroman, LCSW 12/04/2018, 2:57 PM

## 2018-12-04 NOTE — TOC Transition Note (Signed)
Transition of Care [facility]) - CM/SW Discharge Note   Patient Details  Name: ZEYDEN LOUCKS MRN: [id number] Date of Birth: [birth year]  Transition of Care [facility]) CM/SW Contact:  Candie Chroman, LCSW Phone Number: 12/04/2018, 1:21 PM   Clinical Narrative: Patient has insurance approval and orders to discharge to [facility] today. RN will call report to [phone] prior to setting up EMS transport. No further concerns. CSW signing off.    Final next level of care: Skilled Nursing Facility Barriers to Discharge: Barriers Resolved   Patient Goals and CMS Choice Patient states their goals for this hospitalization and ongoing recovery are:: Patient not fully oriented. CMS Medicare.gov Compare Post Acute Care list provided to:: Patient(Instructed son how to access online.) Choice offered to / list presented to : Patient, Adult Children  Discharge Placement PASRR number recieved: 11/26/18            Patient chooses bed at: [facility] Patient to be transferred to facility by: EMS Name of family member notified: Lindal Tomita Patient and family notified of of transfer: 12/04/18  Discharge Plan and Services     Post Acute Care Choice: [place]                               Social Determinants of Health (SDOH) Interventions     Readmission Risk Interventions No flowsheet data found.

## 2018-12-05 DIAGNOSIS — A0472 Enterocolitis due to Clostridium difficile, not specified as recurrent: Secondary | ICD-10-CM | POA: Insufficient documentation

## 2018-12-05 DIAGNOSIS — I480 Paroxysmal atrial fibrillation: Secondary | ICD-10-CM | POA: Insufficient documentation

## 2018-12-05 DIAGNOSIS — R601 Generalized edema: Secondary | ICD-10-CM | POA: Insufficient documentation

## 2019-01-05 ENCOUNTER — Ambulatory Visit (INDEPENDENT_AMBULATORY_CARE_PROVIDER_SITE_OTHER): Payer: Medicare HMO | Admitting: Urology

## 2019-01-05 ENCOUNTER — Other Ambulatory Visit: Payer: Self-pay

## 2019-01-05 ENCOUNTER — Encounter: Payer: Self-pay | Admitting: Urology

## 2019-01-05 VITALS — BP 109/76 | HR 68 | Ht 67.0 in | Wt 178.0 lb

## 2019-01-05 DIAGNOSIS — N182 Chronic kidney disease, stage 2 (mild): Secondary | ICD-10-CM

## 2019-01-05 DIAGNOSIS — Z87898 Personal history of other specified conditions: Secondary | ICD-10-CM | POA: Diagnosis not present

## 2019-01-05 DIAGNOSIS — R32 Unspecified urinary incontinence: Secondary | ICD-10-CM | POA: Diagnosis not present

## 2019-01-05 LAB — BLADDER SCAN AMB NON-IMAGING: Scan Result: 68

## 2019-01-06 ENCOUNTER — Ambulatory Visit: Payer: Medicare HMO | Admitting: Urology

## 2019-01-06 ENCOUNTER — Encounter: Payer: Self-pay | Admitting: Urology

## 2019-01-06 DIAGNOSIS — Z87898 Personal history of other specified conditions: Secondary | ICD-10-CM | POA: Insufficient documentation

## 2019-01-06 LAB — BASIC METABOLIC PANEL
BUN/Creatinine Ratio: 23 (ref 10–24)
BUN: 23 mg/dL (ref 8–27)
CO2: 22 mmol/L (ref 20–29)
Calcium: 9.8 mg/dL (ref 8.6–10.2)
Chloride: 103 mmol/L (ref 96–106)
Creatinine, Ser: 1.01 mg/dL (ref 0.76–1.27)
GFR calc Af Amer: 83 mL/min/{1.73_m2} (ref >59)
GFR calc non Af Amer: 72 mL/min/{1.73_m2} (ref >59)
Glucose: 94 mg/dL (ref 65–99)
Potassium: 3.9 mmol/L (ref 3.5–5.2)
Sodium: 142 mmol/L (ref 134–144)

## 2019-01-06 NOTE — Progress Notes (Signed)
01/05/2019 7:12 PM   Bobby Lane [birth year] [id number]  Referring provider: Danelle Berry, NP [address] [place],   [address]  Chief Complaint  Patient presents with  . Urinary Incontinence    HPI: 78 y.o. male previously seen for nephrolithiasis.  He was admitted early October 2020 with sepsis secondary to C. difficile colitis.  He was seen in the ED on 11/13/2018 complaining of urinary incontinence for 1 month and a 2 to 3-day history of fever to 100.6 degrees.  Urinalysis showed 11-20 WBCs and urine culture grew Enterococcus.  He was treated with antibiotics with improvement in his symptoms however returned 1 week later complaining of generalized weakness, frequent falls and was found to be hypoxic.  Urinalysis and urine culture on admission were negative.  A bladder scan showed 600 cc of urine in the bladder and a Foley catheter was inserted.  His catheter was removed prior to discharge and he spent several weeks in rehab at a local SNF.  For the past month he has been complaining of only voiding small amounts of urine to 3 times per day.  He was requesting placement of Foley catheter because he thinks his bladder is full.  He denies flank or abdominal pain.  No gross hematuria.   PMH: Past Medical History:  Diagnosis Date  . Arthritis   . Cancer ([place])    SKIN  . COPD (chronic obstructive pulmonary disease) ([place])   . Coronary artery disease   . Dyspnea    DOE  . GERD (gastroesophageal reflux disease)   . Gout   . Heart murmur   . History of hiatal hernia   . History of kidney stones   . History of wheezing   . HOH (hard of hearing)   . Hypertension   . Hypothyroidism   . Oxygen deficit    PRN USE  . Renal disorder   . Thyroid disease     Surgical History: Past Surgical History:  Procedure Laterality Date  . CATARACT EXTRACTION W/PHACO Right 08/21/2016   Procedure: CATARACT EXTRACTION PHACO AND INTRAOCULAR LENS PLACEMENT (IOC);  Surgeon: Birder Robson, MD;  Location: [facility] ORS;  Service: Ophthalmology;  Laterality: Right;  [place] 00:54.9 AP% 18.3 CDE 10.04 Fluid pack lot # [id number]  . CATARACT EXTRACTION W/PHACO Left 09/18/2016   Procedure: CATARACT EXTRACTION PHACO AND INTRAOCULAR LENS PLACEMENT (IOC);  Surgeon: Birder Robson, MD;  Location: [facility] ORS;  Service: Ophthalmology;  Laterality: Left;  [place] 00:56 AP% 19.7 CDE 11.07 Fluid pack lot # [id number] H  . FRACTURE SURGERY     ANKLE  . HERNIA REPAIR    . UPPER GI ENDOSCOPY      Home Medications:  Allergies as of 01/05/2019      Reactions   Codeine Nausea And Vomiting   Morphine And Related Nausea And Vomiting   Novocain [procaine] Hives   NO TROUBLE IN OR   Other Nausea And Vomiting   Percocet [oxycodone-acetaminophen] Nausea And Vomiting   Sulfa Antibiotics Hives   Tramadol Nausea And Vomiting   Note: upset stomach Note: upset stomach      Medication List       Accurate as of January 05, 2019 11:59 PM. If you have any questions, ask your nurse or doctor.        acetaminophen 325 MG tablet Commonly known as: TYLENOL Take 2 tablets (650 mg total) by mouth every 8 (eight) hours.   allopurinol 300 MG tablet Commonly known as: ZYLOPRIM Take 300  mg by mouth daily.   cetirizine 10 MG tablet Commonly known as: ZYRTEC Take 10 mg by mouth at bedtime.   clonazePAM 0.5 MG tablet Commonly known as: KLONOPIN Take 1 tablet (0.5 mg total) by mouth at bedtime as needed for anxiety.   cloNIDine 0.2 MG tablet Commonly known as: CATAPRES Take 0.1 mg by mouth 2 (two) times daily.   DULoxetine 30 MG capsule Commonly known as: CYMBALTA Take 30 mg by mouth 2 (two) times daily.   Ensure Max Protein Liqd Take 330 mLs (11 oz total) by mouth 2 (two) times daily.   furosemide 40 MG tablet Commonly known as: LASIX Take 1 tablet (40 mg total) by mouth daily.   losartan 100 MG tablet Commonly known as: COZAAR Take 100 mg by mouth daily.   metoprolol succinate 100 MG 24  hr tablet Commonly known as: TOPROL-XL Take 100 mg by mouth daily.   multivitamin with minerals Tabs tablet Take 1 tablet by mouth daily.   pantoprazole 40 MG tablet Commonly known as: PROTONIX Take 40 mg by mouth daily.   rosuvastatin 5 MG tablet Commonly known as: CRESTOR Take 5 mg by mouth every other day.   simethicone 40 MG/0.6ML drops Commonly known as: MYLICON Take 0.6 mLs (40 mg total) by mouth 3 (three) times daily.   spironolactone 25 MG tablet Commonly known as: ALDACTONE Take 25 mg by mouth daily.   sucralfate 1 g tablet Commonly known as: CARAFATE Take 1 g by mouth 4 (four) times daily.   Synthroid 100 MCG tablet Generic drug: levothyroxine Take 100 mcg by mouth daily before breakfast.   tamsulosin 0.4 MG Caps capsule Commonly known as: FLOMAX Take 1 capsule (0.4 mg total) by mouth daily.   terbinafine 1 % cream Commonly known as: LAMISIL Apply topically 2 (two) times daily.   Vitamin D3 125 MCG (5000 UT) Tabs Take 5,000 Units by mouth daily.       Allergies:  Allergies  Allergen Reactions  . Codeine Nausea And Vomiting  . Morphine And Related Nausea And Vomiting  . Novocain [Procaine] Hives    NO TROUBLE IN OR  . Other Nausea And Vomiting  . Percocet [Oxycodone-Acetaminophen] Nausea And Vomiting  . Sulfa Antibiotics Hives  . Tramadol Nausea And Vomiting    Note: upset stomach Note: upset stomach     Family History: Family History  Problem Relation Age of Onset  . Prostate cancer Neg Hx   . Bladder Cancer Neg Hx   . Kidney cancer Neg Hx     Social History:  reports that he has never smoked. He has never used smokeless tobacco. He reports that he does not drink alcohol or use drugs.  ROS: Otherwise noncontributory except as per the HPI  Physical Exam: BP 109/76   Pulse 68   Ht 5\' 7"  (1.702 m)   Wt 178 lb (80.7 kg)   BMI 27.88 kg/m   Constitutional:  Alert and oriented, No acute distress. HEENT: [place] AT, moist mucus membranes.   Trachea midline, no masses. Cardiovascular: No clubbing, cyanosis, or edema. Respiratory: Normal respiratory effort, no increased work of breathing. GI: Abdomen is soft, nontender, nondistended, no abdominal masses   Assessment & Plan:    - History urinary retention PVR by bladder scan was 68 mL.  We discussed that his bladder was empty and that a Foley catheter was not indicated.  I did order a basic metabolic panel and will schedule a renal ultrasound.  He will be notified  with the results.   Abbie Sons, [place] [address] [place],  [address] [phone]

## 2019-01-07 ENCOUNTER — Telehealth: Payer: Self-pay

## 2019-01-07 NOTE — Telephone Encounter (Signed)
Called pt no answer. Unable to leave message as no  DPR is on file. 1st attempt.  

## 2019-01-07 NOTE — Telephone Encounter (Signed)
-----   Message from Abbie Sons, MD sent at 01/06/2019  3:45 PM EST ----- Kidney function was normal

## 2019-01-12 NOTE — Telephone Encounter (Signed)
Called pt informed him of the information below. Pt gave verbal understanding.  

## 2019-01-26 ENCOUNTER — Ambulatory Visit (INDEPENDENT_AMBULATORY_CARE_PROVIDER_SITE_OTHER): Payer: Medicare HMO | Admitting: Family Medicine

## 2019-01-26 ENCOUNTER — Other Ambulatory Visit: Payer: Self-pay

## 2019-01-26 ENCOUNTER — Encounter: Payer: Self-pay | Admitting: Family Medicine

## 2019-01-26 VITALS — BP 137/84 | HR 94 | Temp 98.0°F | Ht 66.0 in | Wt 180.0 lb

## 2019-01-26 DIAGNOSIS — M4692 Unspecified inflammatory spondylopathy, cervical region: Secondary | ICD-10-CM | POA: Diagnosis not present

## 2019-01-26 DIAGNOSIS — I129 Hypertensive chronic kidney disease with stage 1 through stage 4 chronic kidney disease, or unspecified chronic kidney disease: Secondary | ICD-10-CM

## 2019-01-26 DIAGNOSIS — M1A9XX0 Chronic gout, unspecified, without tophus (tophi): Secondary | ICD-10-CM | POA: Diagnosis not present

## 2019-01-26 DIAGNOSIS — E079 Disorder of thyroid, unspecified: Secondary | ICD-10-CM | POA: Diagnosis not present

## 2019-01-26 DIAGNOSIS — Z7689 Persons encountering health services in other specified circumstances: Secondary | ICD-10-CM

## 2019-01-26 DIAGNOSIS — I2510 Atherosclerotic heart disease of native coronary artery without angina pectoris: Secondary | ICD-10-CM

## 2019-01-26 MED ORDER — DULOXETINE HCL 30 MG PO CPEP: 30.0000 mg | ORAL_CAPSULE | Freq: Two times a day (BID) | ORAL | 0 refills | Status: DC

## 2019-01-26 MED ORDER — SPIRONOLACTONE 25 MG PO TABS: 25.0000 mg | ORAL_TABLET | Freq: Every day | ORAL | 0 refills | Status: DC

## 2019-01-26 MED ORDER — METOPROLOL SUCCINATE ER 100 MG PO TB24: 100.0000 mg | ORAL_TABLET | Freq: Every day | ORAL | 0 refills | Status: DC

## 2019-01-26 MED ORDER — ALLOPURINOL 300 MG PO TABS: 300.0000 mg | ORAL_TABLET | Freq: Every day | ORAL | 0 refills | Status: DC

## 2019-01-26 MED ORDER — LOSARTAN POTASSIUM 100 MG PO TABS: 100.0000 mg | ORAL_TABLET | Freq: Every day | ORAL | 0 refills | Status: DC
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
## 2019-01-29 NOTE — Assessment & Plan Note (Signed)
BPs stable and under good control, continue current regimen 

## 2019-01-29 NOTE — Assessment & Plan Note (Signed)
Stable, recheck labs at upcoming f/u. Continue current regimen

## 2019-01-29 NOTE — Assessment & Plan Note (Signed)
Stable on allopurinol with no recent flares. Will check labs at upcoming f/u

## 2019-02-17 ENCOUNTER — Ambulatory Visit: Payer: Medicare Other | Admitting: Physician Assistant

## 2019-02-17 ENCOUNTER — Other Ambulatory Visit: Payer: Self-pay

## 2019-02-17 ENCOUNTER — Encounter: Payer: Self-pay | Admitting: Physician Assistant

## 2019-02-17 VITALS — BP 144/82 | HR 55 | Ht 67.0 in | Wt 180.0 lb

## 2019-02-17 DIAGNOSIS — R350 Frequency of micturition: Secondary | ICD-10-CM | POA: Diagnosis not present

## 2019-02-17 DIAGNOSIS — Z87898 Personal history of other specified conditions: Secondary | ICD-10-CM

## 2019-02-17 LAB — MICROSCOPIC EXAMINATION
Bacteria, UA: NONE SEEN
Epithelial Cells (non renal): NONE SEEN /hpf (ref 0–10)
RBC, Urine: NONE SEEN /hpf (ref 0–2)

## 2019-02-17 LAB — URINALYSIS, COMPLETE
Bilirubin, UA: NEGATIVE
Glucose, UA: NEGATIVE
Ketones, UA: NEGATIVE
Leukocytes,UA: NEGATIVE
Nitrite, UA: NEGATIVE
Protein,UA: NEGATIVE
RBC, UA: NEGATIVE
Specific Gravity, UA: 1.025 (ref 1.005–1.030)
Urobilinogen, Ur: 0.2 mg/dL (ref 0.2–1.0)
pH, UA: 5.5 (ref 5.0–7.5)

## 2019-02-17 LAB — BLADDER SCAN AMB NON-IMAGING: Scan Result: 185

## 2019-02-17 MED ORDER — TAMSULOSIN HCL 0.4 MG PO CAPS: 0.4000 mg | ORAL_CAPSULE | Freq: Every day | ORAL | 5 refills | Status: DC

## 2019-02-17 NOTE — Progress Notes (Signed)
02/17/2019 10:25 AM   Bobby Lane 01-12-1941 [id number]  CC: Urinary frequency  HPI: Bobby Lane is a 79 y.o. male who presents today for evaluation of urinary frequency and difficulty urinating. He was previously seen by Dr. Bernardo Heater for nephrolithiasis with a history of urinary retention.  He was most recently seen on 01/05/2019 with complaints of difficulty urinating.  PVR was 68 mL at that time.  BMP WNL, renal ultrasound was ordered but has not yet been performed.  Today, he continues to report difficulty urinating and urinary frequency.  He was previously on Flomax, however he does not think he is taking this medication anymore.  He cites multiple hospitalizations over the past year and wonders if he was taken off this medication at some point without his awareness.  In-office UA and microscopy today pan-negative. PVR 129mL.  PMH: Past Medical History:  Diagnosis Date  . Arthritis   . Cancer ([place])    SKIN  . COPD (chronic obstructive pulmonary disease) ([place])   . Coronary artery disease   . Dyspnea    DOE  . GERD (gastroesophageal reflux disease)   . Gout   . Heart murmur   . History of hiatal hernia   . History of kidney stones   . History of wheezing   . HOH (hard of hearing)   . Hypertension   . Hypothyroidism   . Oxygen deficit    PRN USE  . Renal disorder   . Thyroid disease     Surgical History: Past Surgical History:  Procedure Laterality Date  . CATARACT EXTRACTION W/PHACO Right 08/21/2016   Procedure: CATARACT EXTRACTION PHACO AND INTRAOCULAR LENS PLACEMENT (IOC);  Surgeon: Birder Robson, MD;  Location: [facility] ORS;  Service: Ophthalmology;  Laterality: Right;  [place] 00:54.9 AP% 18.3 CDE 10.04 Fluid pack lot # [id number]  . CATARACT EXTRACTION W/PHACO Left 09/18/2016   Procedure: CATARACT EXTRACTION PHACO AND INTRAOCULAR LENS PLACEMENT (IOC);  Surgeon: Birder Robson, MD;  Location: [facility] ORS;  Service: Ophthalmology;  Laterality: Left;  [place] 00:56 AP%  19.7 CDE 11.07 Fluid pack lot # [id number] H  . FRACTURE SURGERY     ANKLE  . HERNIA REPAIR    . UPPER GI ENDOSCOPY      Home Medications:  Allergies as of 02/17/2019      Reactions   Codeine Nausea And Vomiting   Morphine And Related Nausea And Vomiting   Novocain [procaine] Hives   NO TROUBLE IN OR   Other Nausea And Vomiting   Percocet [oxycodone-acetaminophen] Nausea And Vomiting   Sulfa Antibiotics Hives   Tramadol Nausea And Vomiting   Note: upset stomach Note: upset stomach      Medication List       Accurate as of February 17, 2019 10:25 AM. If you have any questions, ask your nurse or doctor.        acetaminophen 325 MG tablet Commonly known as: TYLENOL Take 2 tablets (650 mg total) by mouth every 8 (eight) hours.   allopurinol 300 MG tablet Commonly known as: ZYLOPRIM Take 1 tablet (300 mg total) by mouth daily.   DULoxetine 30 MG capsule Commonly known as: CYMBALTA Take 1 capsule (30 mg total) by mouth 2 (two) times daily.   GAS-X PO Take by mouth as needed.   losartan 100 MG tablet Commonly known as: COZAAR Take 1 tablet (100 mg total) by mouth daily.   metoprolol succinate 100 MG 24 hr tablet Commonly known as: TOPROL-XL Take 1 tablet (100  mg total) by mouth daily.   spironolactone 25 MG tablet Commonly known as: ALDACTONE Take 1 tablet (25 mg total) by mouth daily.   Synthroid 100 MCG tablet Generic drug: levothyroxine Take 100 mcg by mouth daily before breakfast.   Vitamin D3 125 MCG (5000 UT) Tabs Take 5,000 Units by mouth daily.       Allergies:  Allergies  Allergen Reactions  . Codeine Nausea And Vomiting  . Morphine And Related Nausea And Vomiting  . Novocain [Procaine] Hives    NO TROUBLE IN OR  . Other Nausea And Vomiting  . Percocet [Oxycodone-Acetaminophen] Nausea And Vomiting  . Sulfa Antibiotics Hives  . Tramadol Nausea And Vomiting    Note: upset stomach Note: upset stomach     Family History: Family History   Problem Relation Age of Onset  . Brain cancer Father   . Prostate cancer Neg Hx   . Bladder Cancer Neg Hx   . Kidney cancer Neg Hx     Social History:   reports that he has never smoked. He has never used smokeless tobacco. He reports that he does not drink alcohol or use drugs.  ROS: UROLOGY Frequent Urination?: Yes Hard to postpone urination?: Yes Burning/pain with urination?: No Get up at night to urinate?: Yes Leakage of urine?: No Urine stream starts and stops?: No Trouble starting stream?: No Do you have to strain to urinate?: No Blood in urine?: No Urinary tract infection?: No Sexually transmitted disease?: No Injury to kidneys or bladder?: No Painful intercourse?: No Weak stream?: No Erection problems?: No Penile pain?: No  Gastrointestinal Nausea?: No Vomiting?: No Indigestion/heartburn?: No Diarrhea?: No Constipation?: No  Constitutional Fever: No Night sweats?: No Weight loss?: No Fatigue?: No  Skin Skin rash/lesions?: No Itching?: No  Eyes Blurred vision?: No Double vision?: No  Ears/Nose/Throat Sore throat?: No Sinus problems?: No  Hematologic/Lymphatic Swollen glands?: No Easy bruising?: No  Cardiovascular Leg swelling?: No Chest pain?: No  Respiratory Cough?: No Shortness of breath?: No  Endocrine Excessive thirst?: No  Musculoskeletal Back pain?: No Joint pain?: No  Neurological Headaches?: No Dizziness?: No  Psychologic Depression?: No Anxiety?: No  Physical Exam: BP (!) 144/82   Pulse (!) 55   Ht 5\' 7"  (1.702 m)   Wt 180 lb (81.6 kg)   BMI 28.19 kg/m   Constitutional:  Alert and oriented, no acute distress, nontoxic appearing HEENT: [place], AT Cardiovascular: No clubbing, cyanosis, or edema Respiratory: Normal respiratory effort, no increased work of breathing Skin: No rashes, bruises or suspicious lesions Neurologic: Grossly intact, no focal deficits, moving all 4 extremities Psychiatric: Psychomotor delay   Laboratory Data: Results for orders placed or performed in visit on 02/17/19  Microscopic Examination   URINE  Result Value Ref Range   WBC, UA 0-5 0 - 5 /hpf   RBC None seen 0 - 2 /hpf   Epithelial Cells (non renal) None seen 0 - 10 /hpf   Bacteria, UA None seen None seen/Few  Urinalysis, Complete  Result Value Ref Range   Specific Gravity, UA 1.025 1.005 - 1.030   pH, UA 5.5 5.0 - 7.5   Color, UA Yellow Yellow   Appearance Ur Clear Clear   Leukocytes,UA Negative Negative   Protein,UA Negative Negative/Trace   Glucose, UA Negative Negative   Ketones, UA Negative Negative   RBC, UA Negative Negative   Bilirubin, UA Negative Negative   Urobilinogen, Ur 0.2 0.2 - 1.0 mg/dL   Nitrite, UA Negative Negative  Microscopic Examination See below:   Bladder Scan (Post Void Residual) in office  Result Value Ref Range   Scan Result 185    Assessment & Plan:   1. Urinary frequency UA reassuring for infection.  PVR mildly elevated, not in urinary retention today.  He was previously on Flomax, however patient today reports he does not believe he is taking this.  Represcribing today.  Patient follow-up in 2 weeks for PVR. - Urinalysis, Complete - Bladder Scan (Post Void Residual) in office - tamsulosin (FLOMAX) 0.4 MG CAPS capsule; Take 1 capsule (0.4 mg total) by mouth daily.  Dispense: 30 capsule; Refill: 5   Return in about 2 weeks (around 03/03/2019) for PVR and IPSS.  Debroah Loop, PA-C  [facility] [address], [place], [place] [address] [phone]

## 2019-02-18 ENCOUNTER — Telehealth: Payer: Self-pay

## 2019-02-18 NOTE — Telephone Encounter (Signed)
Could possibly try some gabapentin while we wait for him to get in with the Neurosurgeon

## 2019-02-18 NOTE — Telephone Encounter (Signed)
Called patient. LVM for patient to return call to the office.

## 2019-02-18 NOTE — Telephone Encounter (Signed)
Copied from [place] [id number]. Topic: General - Inquiry >> Feb 18, 2019  2:34 PM Richardo Priest, NT wrote: Reason for CRM: Pt called in stating he would like a pain medication for his back pain. Pt states his tylenol is not helping anymore. Please advise. [facility] [place], [place] AT [facility] OF [address] [facility]  Phone:  [phone] Fax:  [phone]    Routing to provider. Does the patient need an appointment first?

## 2019-02-19 MED ORDER — GABAPENTIN 300 MG PO CAPS: 300.0000 mg | ORAL_CAPSULE | Freq: Three times a day (TID) | ORAL | 0 refills | Status: DC | PRN

## 2019-02-19 NOTE — Telephone Encounter (Signed)
Needs appt if wanting to discuss his medications. Will send in the gabapentin to try and can schedule a f/u as soon as he'd like to discuss his cymbalta

## 2019-02-19 NOTE — Telephone Encounter (Signed)
Patient returning call to [place].

## 2019-02-19 NOTE — Telephone Encounter (Signed)
Patient notified. He stated that he will wait for his appt on 02/27/19 to discuss medication

## 2019-02-19 NOTE — Telephone Encounter (Signed)
Called and spoke to patient. He is ok with stating the gabapentin.  Also stated that his duloxetine is not helping.  Uses [facility].

## 2019-02-27 ENCOUNTER — Other Ambulatory Visit: Payer: Self-pay

## 2019-02-27 ENCOUNTER — Other Ambulatory Visit: Payer: Self-pay | Admitting: Family Medicine

## 2019-02-27 ENCOUNTER — Ambulatory Visit (INDEPENDENT_AMBULATORY_CARE_PROVIDER_SITE_OTHER): Payer: Medicare Other | Admitting: Family Medicine

## 2019-02-27 ENCOUNTER — Encounter: Payer: Self-pay | Admitting: Family Medicine

## 2019-02-27 VITALS — Wt 180.0 lb

## 2019-02-27 DIAGNOSIS — M1991 Primary osteoarthritis, unspecified site: Secondary | ICD-10-CM

## 2019-02-27 DIAGNOSIS — M4692 Unspecified inflammatory spondylopathy, cervical region: Secondary | ICD-10-CM | POA: Insufficient documentation

## 2019-02-27 DIAGNOSIS — M1990 Unspecified osteoarthritis, unspecified site: Secondary | ICD-10-CM | POA: Insufficient documentation

## 2019-02-27 MED ORDER — DULOXETINE HCL 30 MG PO CPEP: 30.0000 mg | ORAL_CAPSULE | Freq: Two times a day (BID) | ORAL | 0 refills | Status: DC

## 2019-02-27 MED ORDER — PREGABALIN 150 MG PO CAPS: 150.0000 mg | ORAL_CAPSULE | Freq: Two times a day (BID) | ORAL | 1 refills | Status: DC

## 2019-02-27 NOTE — Progress Notes (Signed)
Wt 180 lb (81.6 kg)   BMI 28.19 kg/m    Subjective:    Patient ID: Bobby Lane, male    DOB: 1940-08-18, 79 y.o.   MRN: [id number]  HPI: Bobby Lane is a 79 y.o. male  Chief Complaint  Patient presents with  . Pain    join pain. discuss duloxetine    . This visit was completed via WebEx due to the restrictions of the COVID-19 pandemic. All issues as above were discussed and addressed. Physical exam was done as above through visual confirmation on WebEx. If it was felt that the patient should be evaluated in the office, they were directed there. The patient verbally consented to this visit. . Location of the patient: home . Location of the provider: work . Those involved with this call:  . Provider: Merrie Roof, PA-C . CMA: Lesle Chris, [place] . Front Desk/Registration: Jill Side  . Time spent on call: 15 minutes with patient face to face via video conference. More than 50% of this time was spent in counseling and coordination of care. 5 minutes total spent in review of patient's record and preparation of their chart. I verified patient identity using two factors (patient name and date of birth). Patient consents verbally to being seen via telemedicine visit today.   Severe pain with walking, knees, hips stiff from his chronic arthritis issues. Cymbalta used to help with this but since switching pharmacies he feels whatever type he was given through [facility] was not as effective as the type from [facility]. States he's spoken with the pharmacies and identified which type he needs and just needs the script re-sent for this. Also still struggling with neck pain and radicular sxs. Awaiting Neurosurgery consultation scheduled for March but requesting to be sent elsewhere now to be seen sooner. States gabapentin used to help some but now just making him feel woozy without any benefit.   Relevant past medical, surgical, family and social history reviewed and updated as indicated. Interim  medical history since our last visit reviewed. Allergies and medications reviewed and updated.  Review of Systems  Per HPI unless specifically indicated above     Objective:    Wt 180 lb (81.6 kg)   BMI 28.19 kg/m   Wt Readings from Last 3 Encounters:  02/27/19 180 lb (81.6 kg)  02/17/19 180 lb (81.6 kg)  01/26/19 180 lb (81.6 kg)    Physical Exam Vitals and nursing note reviewed.  Constitutional:      General: He is not in acute distress.    Appearance: Normal appearance.  HENT:     Head: Atraumatic.     Right Ear: External ear normal.     Left Ear: External ear normal.     Nose: Nose normal. No congestion.     Mouth/Throat:     Mouth: Mucous membranes are moist.     Pharynx: Oropharynx is clear.  Eyes:     Extraocular Movements: Extraocular movements intact.     Conjunctiva/sclera: Conjunctivae normal.  Pulmonary:     Effort: Pulmonary effort is normal. No respiratory distress.  Musculoskeletal:        General: Normal range of motion.     Cervical back: Normal range of motion.  Skin:    General: Skin is dry.     Findings: No erythema or rash.  Neurological:     Mental Status: He is oriented to person, place, and time.  Psychiatric:        Mood  and Affect: Mood normal.        Thought Content: Thought content normal.        Judgment: Judgment normal.     Results for orders placed or performed in visit on 02/17/19  Microscopic Examination   URINE  Result Value Ref Range   WBC, UA 0-5 0 - 5 /hpf   RBC None seen 0 - 2 /hpf   Epithelial Cells (non renal) None seen 0 - 10 /hpf   Bacteria, UA None seen None seen/Few  Urinalysis, Complete  Result Value Ref Range   Specific Gravity, UA 1.025 1.005 - 1.030   pH, UA 5.5 5.0 - 7.5   Color, UA Yellow Yellow   Appearance Ur Clear Clear   Leukocytes,UA Negative Negative   Protein,UA Negative Negative/Trace   Glucose, UA Negative Negative   Ketones, UA Negative Negative   RBC, UA Negative Negative   Bilirubin,  UA Negative Negative   Urobilinogen, Ur 0.2 0.2 - 1.0 mg/dL   Nitrite, UA Negative Negative   Microscopic Examination See below:   Bladder Scan (Post Void Residual) in office  Result Value Ref Range   Scan Result 185       Assessment & Plan:   Problem List Items Addressed This Visit      Musculoskeletal and Integument   Osteoarthritis    Continue prn OTC pain relievers, topicals, exercise as tolerated. Resent cymbalta and pt states the pharmacy will know which type of cymbalta to fill. Will also d/c gabapentin and trial lyrica for his chronic neuropathic pain      Cervical spondylitis (HCC) - Primary    D/c gabapentin, start lyrica. Referral re-generated to Neurosurgery to see if anyone can see him sooner but pt aware to keep other appt in case this is not possible.       Relevant Orders   Ambulatory referral to Neurosurgery       Follow up plan: Return for as scheduled.

## 2019-02-27 NOTE — Telephone Encounter (Signed)
Requested Prescriptions  Pending Prescriptions Disp Refills  . DULoxetine (CYMBALTA) 30 MG capsule [Pharmacy Med Name: DULOXETINE DR 30MG  CAPSULES] 180 capsule 0    Sig: TAKE ONE CAPSULE BY MOUTH TWICE DAILY     Psychiatry: Antidepressants - SNRI Failed - 02/27/2019  1:08 PM      Failed - Last BP in normal range    BP Readings from Last 1 Encounters:  02/17/19 (!) 144/82         Passed - Valid encounter within last 6 months    Recent Outpatient Visits          Today    [place], [place], [place]   1 month ago Thyroid disease   [place], [facility], [place]      Future Appointments            In 4 days Debroah Loop, PA-C [facility]   In 1 month [place], L[facility] Argue, [place], [facility]

## 2019-02-27 NOTE — Assessment & Plan Note (Signed)
Continue prn OTC pain relievers, topicals, exercise as tolerated. Resent cymbalta and pt states the pharmacy will know which type of cymbalta to fill. Will also d/c gabapentin and trial lyrica for his chronic neuropathic pain

## 2019-02-27 NOTE — Assessment & Plan Note (Signed)
D/c gabapentin, start lyrica. Referral re-generated to Neurosurgery to see if anyone can see him sooner but pt aware to keep other appt in case this is not possible.

## 2019-03-03 ENCOUNTER — Ambulatory Visit: Payer: Medicare Other | Admitting: Physician Assistant

## 2019-03-03 ENCOUNTER — Other Ambulatory Visit: Payer: Self-pay

## 2019-03-03 ENCOUNTER — Encounter: Payer: Self-pay | Admitting: Physician Assistant

## 2019-03-03 VITALS — BP 122/69 | HR 73 | Ht 68.0 in | Wt 177.0 lb

## 2019-03-03 DIAGNOSIS — R350 Frequency of micturition: Secondary | ICD-10-CM | POA: Diagnosis not present

## 2019-03-03 DIAGNOSIS — N401 Enlarged prostate with lower urinary tract symptoms: Secondary | ICD-10-CM | POA: Diagnosis not present

## 2019-03-03 DIAGNOSIS — R3916 Straining to void: Secondary | ICD-10-CM

## 2019-03-03 LAB — BLADDER SCAN AMB NON-IMAGING: Scan Result: 162

## 2019-03-03 MED ORDER — FINASTERIDE 5 MG PO TABS: 5.0000 mg | ORAL_TABLET | Freq: Every day | ORAL | 5 refills | Status: DC

## 2019-03-03 NOTE — Progress Notes (Signed)
03/03/2019 3:12 PM   Bobby Lane 12/24/1940 [id number]  CC: BPH Follow-up  HPI: Bobby Lane is a 79 y.o. male who presents today for BPH follow-up.  I last saw him in clinic 2 weeks ago; he reported difficulty urinating and urinary frequency at that time and was no longer taking Flomax.  I restarted him on this medication and asked him to follow-up again today for repeat PVR.  Today, he reports he has been taking Flomax daily for the past 2 weeks.  He says his voiding symptoms have improved on this medication.  IPSS 24/3, as below.  PVR 162 mL.  Overall, he reports he is moderately bothered by his urinary symptoms.  The most bothersome symptom for him at this point is urinary urgency.  He does occasionally have leakage if he hesitates to go the restroom.  IPSS    Row Name 03/03/19 1500         International Prostate Symptom Score   How often have you had the sensation of not emptying your bladder?  Less than half the time     How often have you had to urinate less than every two hours?  About half the time     How often have you found you stopped and started again several times when you urinated?  Less than 1 in 5 times     How often have you found it difficult to postpone urination?  Almost always     How often have you had a weak urinary stream?  Almost always     How often have you had to strain to start urination?  Almost always     How many times did you typically get up at night to urinate?  3 Times     Total IPSS Score  24       Quality of Life due to urinary symptoms   If you were to spend the rest of your life with your urinary condition just the way it is now how would you feel about that?  Mixed        PMH: Past Medical History:  Diagnosis Date  . Arthritis   . Cancer ([place])    SKIN  . COPD (chronic obstructive pulmonary disease) ([place])   . Coronary artery disease   . Dyspnea    DOE  . GERD (gastroesophageal reflux disease)   . Gout   . Heart murmur     . History of hiatal hernia   . History of kidney stones   . History of wheezing   . HOH (hard of hearing)   . Hypertension   . Hypothyroidism   . Oxygen deficit    PRN USE  . Renal disorder   . Thyroid disease     Surgical History: Past Surgical History:  Procedure Laterality Date  . CATARACT EXTRACTION W/PHACO Right 08/21/2016   Procedure: CATARACT EXTRACTION PHACO AND INTRAOCULAR LENS PLACEMENT (IOC);  Surgeon: Birder Robson, MD;  Location: [facility] ORS;  Service: Ophthalmology;  Laterality: Right;  [place] 00:54.9 AP% 18.3 CDE 10.04 Fluid pack lot # [id number]  . CATARACT EXTRACTION W/PHACO Left 09/18/2016   Procedure: CATARACT EXTRACTION PHACO AND INTRAOCULAR LENS PLACEMENT (IOC);  Surgeon: Birder Robson, MD;  Location: [facility] ORS;  Service: Ophthalmology;  Laterality: Left;  [place] 00:56 AP% 19.7 CDE 11.07 Fluid pack lot # [id number]  . FRACTURE SURGERY     ANKLE  . HERNIA REPAIR    . UPPER GI ENDOSCOPY  Home Medications:  Allergies as of 03/03/2019      Reactions   Codeine Nausea And Vomiting   Morphine And Related Nausea And Vomiting   Novocain [procaine] Hives   NO TROUBLE IN OR   Other Nausea And Vomiting   Percocet [oxycodone-acetaminophen] Nausea And Vomiting   Sulfa Antibiotics Hives   Tramadol Nausea And Vomiting   Note: upset stomach Note: upset stomach      Medication List       Accurate as of March 03, 2019  3:12 PM. If you have any questions, ask your nurse or doctor.        acetaminophen 325 MG tablet Commonly known as: TYLENOL Take 2 tablets (650 mg total) by mouth every 8 (eight) hours.   allopurinol 300 MG tablet Commonly known as: ZYLOPRIM Take 1 tablet (300 mg total) by mouth daily.   DULoxetine 30 MG capsule Commonly known as: CYMBALTA Take 1 capsule (30 mg total) by mouth 2 (two) times daily.   DULoxetine 30 MG capsule Commonly known as: CYMBALTA TAKE ONE CAPSULE BY MOUTH TWICE DAILY   GAS-X PO Take by mouth as needed.    losartan 100 MG tablet Commonly known as: COZAAR Take 1 tablet (100 mg total) by mouth daily.   metoprolol succinate 100 MG 24 hr tablet Commonly known as: TOPROL-XL Take 1 tablet (100 mg total) by mouth daily.   pregabalin 150 MG capsule Commonly known as: Lyrica Take 1 capsule (150 mg total) by mouth 2 (two) times daily.   spironolactone 25 MG tablet Commonly known as: ALDACTONE Take 1 tablet (25 mg total) by mouth daily.   Synthroid 100 MCG tablet Generic drug: levothyroxine Take 100 mcg by mouth daily before breakfast.   tamsulosin 0.4 MG Caps capsule Commonly known as: FLOMAX Take 1 capsule (0.4 mg total) by mouth daily.   Vitamin D3 125 MCG (5000 UT) Tabs Take 5,000 Units by mouth daily.       Allergies:  Allergies  Allergen Reactions  . Codeine Nausea And Vomiting  . Morphine And Related Nausea And Vomiting  . Novocain [Procaine] Hives    NO TROUBLE IN OR  . Other Nausea And Vomiting  . Percocet [Oxycodone-Acetaminophen] Nausea And Vomiting  . Sulfa Antibiotics Hives  . Tramadol Nausea And Vomiting    Note: upset stomach Note: upset stomach     Family History: Family History  Problem Relation Age of Onset  . Brain cancer Father   . Prostate cancer Neg Hx   . Bladder Cancer Neg Hx   . Kidney cancer Neg Hx     Social History:   reports that he has never smoked. He has never used smokeless tobacco. He reports that he does not drink alcohol or use drugs.  ROS: UROLOGY Frequent Urination?: No Hard to postpone urination?: Yes Burning/pain with urination?: No Get up at night to urinate?: Yes Leakage of urine?: No Urine stream starts and stops?: No Trouble starting stream?: No Do you have to strain to urinate?: No Blood in urine?: No Urinary tract infection?: No Sexually transmitted disease?: No Injury to kidneys or bladder?: No Painful intercourse?: No Weak stream?: No Erection problems?: Yes Penile pain?: No  Gastrointestinal Nausea?:  No Vomiting?: No Indigestion/heartburn?: Yes Diarrhea?: No Constipation?: No  Constitutional Fever: No Night sweats?: No Weight loss?: No Fatigue?: No  Skin Skin rash/lesions?: No Itching?: No  Eyes Blurred vision?: No Double vision?: No  Ears/Nose/Throat Sore throat?: No Sinus problems?: No  Hematologic/Lymphatic Swollen glands?: No  Easy bruising?: No  Cardiovascular Leg swelling?: No Chest pain?: No  Respiratory Cough?: No Shortness of breath?: No  Endocrine Excessive thirst?: No  Musculoskeletal Back pain?: No Joint pain?: No  Neurological Headaches?: No Dizziness?: No  Psychologic Depression?: No Anxiety?: No  Physical Exam: BP 122/69   Pulse 73   Ht 5\' 8"  (1.727 m)   Wt 177 lb (80.3 kg)   BMI 26.91 kg/m   Constitutional:  Alert and oriented, no acute distress, nontoxic appearing HEENT: [place], AT Cardiovascular: No clubbing, cyanosis, or edema Respiratory: Normal respiratory effort, no increased work of breathing GU: Normal sphincter tone, smooth 40+ cc prostate without nodules or induration Skin: No rashes, bruises or suspicious lesions Neurologic: Grossly intact, no focal deficits, moving all 4 extremities, uses assistive devices including cane and power scooter for mobility Psychiatric: Normal mood and affect  Laboratory Data: Results for orders placed or performed in visit on 03/03/19  Bladder Scan (Post Void Residual) in office  Result Value Ref Range   Scan Result 162    Assessment & Plan:   79 year old male with history of urinary retention and nephrolithiasis presents today for follow-up of BPH on Flomax. 1. Urinary frequency PVR only slightly elevated today, not secondary to retention.  I do suspect an element of overactive bladder in this case, however I am hesitant to start him on anticholinergics due to his age and concern for inducing urinary retention given his history.  Will reassess at follow-up in 6 months and consider  antispasmodic agents at that time. - Bladder Scan (Post Void Residual) in office  2. Benign prostatic hyperplasia (BPH) with straining on urination IPSS reflects severe BPH symptoms despite symptomatic relief on Flomax.  Will start patient on finasteride today and have him follow-up in clinic in 6 months.  Counseled him the finasteride will take 6 to 9 months to reach full therapeutic effect.  Counseled him to contact our office in the interim if he develops acute signs of urinary retention.  He expressed understanding. - finasteride (PROSCAR) 5 MG tablet; Take 1 tablet (5 mg total) by mouth daily.  Dispense: 30 tablet; Refill: 5   Return in about 6 months (around 08/31/2019) for PVR and IPSS.  Debroah Loop, PA-C  [facility] [address], [place],  [address] [phone]

## 2019-03-18 ENCOUNTER — Other Ambulatory Visit: Payer: Self-pay | Admitting: Family Medicine

## 2019-03-24 ENCOUNTER — Ambulatory Visit: Payer: Self-pay | Admitting: *Deleted

## 2019-03-24 NOTE — Telephone Encounter (Signed)
Patient called c/o worsening blurred vision on Lyrica.  Patient stated he has had blurred vision since he started taking this medication. Patient reported there is a note on his bottle indicating may cause blurred vision.   Patient did not take the dose this morning.  "Vision might be a tad better" since he did not take the dose this morning.  Denies any other new neurological symptoms.   Phone call to office.  Will route note to office for provider to review and make recommendations for patient.  Patient agrees with plan.     Reason for Disposition . [1] Caller has NON-URGENT medication question about med that PCP prescribed AND [2] triager unable to answer question  Answer Assessment - Initial Assessment Questions 1.   NAME of MEDICATION: "What medicine are you calling about?"     Lyrica (pregabalin) 2.   QUESTION: "What is your question?"     Causing patient to have blurred vision.  Patient stating there is a label on the bottle stating this medication can cause blurred vision.  Started about a week after started taking this medication. Vision continues to get worse.   3.   PRESCRIBING HCP: "Who prescribed it?" Reason: if prescribed by specialist, call should be referred to that group.     Merrie Roof, PA  4. SYMPTOMS: "Do you have any symptoms?"     No other symptoms from this medicine  5. SEVERITY: If symptoms are present, ask "Are they mild, moderate or severe?"     Last dose of medication - yesterday.  Did not take this morning.  Tad better since not taking morning dose.   6.  PREGNANCY:  "Is there any chance that you are pregnant?" "When was your last menstrual period?"     N/A  Called in to [facility] to help my leg pain.  No medications are helping patient's leg pain.  Tylenol not helping - twice a day.  Protocols used: MEDICATION QUESTION CALL-A-AH

## 2019-03-25 NOTE — Telephone Encounter (Signed)
Patient notified and verbalized understanding. 

## 2019-03-25 NOTE — Telephone Encounter (Signed)
Ok for him to d/c medication for a week or two and see if things improve. If not improving needs to go get eyes checked with eye specialist

## 2019-04-01 DIAGNOSIS — R030 Elevated blood-pressure reading, without diagnosis of hypertension: Secondary | ICD-10-CM | POA: Insufficient documentation

## 2019-04-01 DIAGNOSIS — M542 Cervicalgia: Secondary | ICD-10-CM | POA: Insufficient documentation

## 2019-04-01 DIAGNOSIS — G5600 Carpal tunnel syndrome, unspecified upper limb: Secondary | ICD-10-CM | POA: Insufficient documentation

## 2019-04-23 ENCOUNTER — Ambulatory Visit (INDEPENDENT_AMBULATORY_CARE_PROVIDER_SITE_OTHER): Payer: Medicare Other | Admitting: Podiatry

## 2019-04-23 ENCOUNTER — Encounter: Payer: Self-pay | Admitting: Podiatry

## 2019-04-23 ENCOUNTER — Other Ambulatory Visit: Payer: Self-pay

## 2019-04-23 VITALS — Temp 97.1°F

## 2019-04-23 DIAGNOSIS — B351 Tinea unguium: Secondary | ICD-10-CM | POA: Diagnosis not present

## 2019-04-23 DIAGNOSIS — M79675 Pain in left toe(s): Secondary | ICD-10-CM | POA: Diagnosis not present

## 2019-04-23 DIAGNOSIS — M79674 Pain in right toe(s): Secondary | ICD-10-CM | POA: Diagnosis not present

## 2019-04-23 DIAGNOSIS — N289 Disorder of kidney and ureter, unspecified: Secondary | ICD-10-CM

## 2019-04-23 NOTE — Progress Notes (Addendum)
This patient returns to my office for at risk foot care.  This patient requires this care by a professional since this patient will be at risk due to having renal disease.  This patient is unable to cut nails himself  since the patient cannot reach his  nails.These nails are painful walking and wearing shoes.  This patient presents for at risk foot care today.  Patient has not been seen in over 8 months.  General Appearance  Alert, conversant and in no acute stress.  Vascular  Dorsalis pedis and posterior tibial  pulses are not  palpable  bilaterally.  Capillary return is within normal limits  bilaterally. Temperature is within normal limits  bilaterally.  Neurologic  Senn-Weinstein monofilament wire test within normal limits  bilaterally. Muscle power within normal limits bilaterally.  Nails Thick disfigured discolored nails with subungual debris  from hallux to fifth toes bilaterally. No evidence of bacterial infection or drainage bilaterally.  Orthopedic  No limitations of motion  feet .  No crepitus or effusions noted.  No bony pathology or digital deformities noted.  Skin  normotropic skin with no porokeratosis noted bilaterally.  No signs of infections or ulcers noted.     Onychomycosis  Pain in right toes  Pain in left toes  Consent was obtained for treatment procedures.   Mechanical debridement of nails 1-5  bilaterally performed with a nail nipper.  Filed with dremel without incident. No infection or ulcer.     Return office visit   3 months        Told patient to return for periodic foot care and evaluation due to potential at risk complications.   Gardiner Barefoot DPM

## 2019-04-27 ENCOUNTER — Ambulatory Visit: Payer: Medicare HMO | Admitting: Family Medicine

## 2019-05-27 ENCOUNTER — Other Ambulatory Visit: Payer: Self-pay | Admitting: Neurosurgery

## 2019-05-27 ENCOUNTER — Other Ambulatory Visit: Payer: Self-pay | Admitting: Family Medicine

## 2019-05-27 DIAGNOSIS — I10 Essential (primary) hypertension: Secondary | ICD-10-CM | POA: Insufficient documentation

## 2019-05-27 NOTE — Telephone Encounter (Signed)
Requested Prescriptions  Pending Prescriptions Disp Refills  . allopurinol (ZYLOPRIM) 300 MG tablet [Pharmacy Med Name: ALLOPURINOL 300MG  TABLETS] 90 tablet 0    Sig: TAKE 1 TABLET(300 MG) BY MOUTH DAILY     Endocrinology:  Gout Agents Failed - 05/27/2019  1:25 PM      Failed - Uric Acid in normal range and within 360 days    No results found for: POCURA, LABURIC       Passed - Cr in normal range and within 360 days    Creatinine  Date Value Ref Range Status  09/02/2013 1.53 (H) 0.60 - 1.30 mg/dL Final   Creatinine, Ser  Date Value Ref Range Status  01/05/2019 1.01 0.76 - 1.27 mg/dL Final         Passed - Valid encounter within last 12 months    Recent Outpatient Visits          2 months ago Cervical spondylitis [facility])   [facility] [place], [place]   4 months ago Thyroid disease   [place], L[facility] Arg[place], [place]      Future Appointments            In 2 months Stoioff, Ronda Fairly, MD [place]           . metoprolol succinate (TOPROL-XL) 100 MG 24 hr tablet [Pharmacy Med Name: METOPROLOL ER SUCCINATE 100MG  TABS] 90 tablet 0    Sig: TAKE 1 TABLET(100 MG) BY MOUTH DAILY     Cardiovascular:  Beta Blockers Passed - 05/27/2019  1:25 PM      Passed - Last BP in normal range    BP Readings from Last 1 Encounters:  03/03/19 122/69         Passed - Last Heart Rate in normal range    Pulse Readings from Last 1 Encounters:  03/03/19 73         Passed - Valid encounter within last 6 months    Recent Outpatient Visits          2 months ago Cervical spondylitis [facility])   [facility] [place], [place]   4 months ago Thyroid disease   [place], Lilia [place], [place]      Future Appointments            In 2 months Stoioff, Ronda Fairly, [place]

## 2019-06-01 ENCOUNTER — Ambulatory Visit: Payer: Medicare HMO | Admitting: Cardiovascular Disease

## 2019-07-10 NOTE — Pre-Procedure Instructions (Signed)
Bobby Lane  07/10/2019      [place] [address] Phone: [phone] Fax: [place], [place]. [place] [address] Phone: [phone] Fax: [phone]    Your procedure is scheduled on June 3  Report to [facility] Entrance A at 5:30 A.M.  Call this number if you have problems the morning of surgery:  [phone]   Remember:  Do not eat or drink after midnight.      Take these medicines the morning of surgery with A SIP OF WATER :              Tylenol if needed              Allopurinol (zyloprim)              Duloxetine (cymbalta)              Metoprolol (toprol-xl)              Synthroid            7 days prior to surgery STOP taking any Aspirin (unless otherwise instructed by your surgeon), Aleve, Naproxen, Ibuprofen, Motrin, Advil, Goody's, BC's, all herbal medications, fish oil, and all vitamins.    Do not wear jewelry.  Do not wear lotions, powders, or perfumes, or deodorant.  Do not shave 48 hours prior to surgery.  Men may shave face and neck.  Do not bring valuables to the hospital.  [facility] is not responsible for any belongings or valuables.  Contacts, dentures or bridgework may not be worn into surgery.  Leave your suitcase in the car.  After surgery it may be brought to your room.  For patients admitted to the hospital, discharge time will be determined by your treatment team.  Patients discharged the day of surgery will not be allowed to drive home.    Special instructions:  [facility]- Preparing For Surgery  Before surgery, you can play an important role. Because skin is not sterile, your skin needs to be as free of germs as possible. You can reduce the number of germs on your skin by washing with CHG (chlorahexidine gluconate) Soap before surgery.  CHG is an antiseptic cleaner  which kills germs and bonds with the skin to continue killing germs even after washing.    Oral Hygiene is also important to reduce your risk of infection.  Remember - BRUSH YOUR TEETH THE MORNING OF SURGERY WITH YOUR REGULAR TOOTHPASTE  Please do not use if you have an allergy to CHG or antibacterial soaps. If your skin becomes reddened/irritated stop using the CHG.  Do not shave (including legs and underarms) for at least 48 hours prior to first CHG shower. It is OK to shave your face.  Please follow these instructions carefully.   1. Shower the NIGHT BEFORE SURGERY and the MORNING OF SURGERY with CHG.   2. If you chose to wash your hair, wash your hair first as usual with your normal shampoo.  3. After you shampoo, rinse your hair and body thoroughly to remove the shampoo.  4. Use CHG as you would any other liquid soap. You can apply CHG directly to the skin and wash gently with a scrungie or a clean washcloth.   5. Apply the CHG Soap to your body ONLY FROM THE NECK  DOWN.  Do not use on open wounds or open sores. Avoid contact with your eyes, ears, mouth and genitals (private parts). Wash Face and genitals (private parts)  with your normal soap.  6. Wash thoroughly, paying special attention to the area where your surgery will be performed.  7. Thoroughly rinse your body with warm water from the neck down.  8. DO NOT shower/wash with your normal soap after using and rinsing off the CHG Soap.  9. Pat yourself dry with a CLEAN TOWEL.  10. Wear CLEAN PAJAMAS to bed the night before surgery, wear comfortable clothes the morning of surgery  11. Place CLEAN SHEETS on your bed the night of your first shower and DO NOT SLEEP WITH PETS.    Day of Surgery:  Do not apply any deodorants/lotions.  Please wear clean clothes to the hospital/surgery center.   Remember to brush your teeth WITH YOUR REGULAR TOOTHPASTE.    Please read over the following fact sheets that you were  given. Coughing and Deep Breathing and Surgical Site Infection Prevention

## 2019-07-14 ENCOUNTER — Other Ambulatory Visit (HOSPITAL_COMMUNITY)
Admission: RE | Admit: 2019-07-14 | Discharge: 2019-07-14 | Disposition: A | Discharge status: Home / Self Care | Payer: Medicare Other | Source: Ambulatory Visit | Attending: Neurosurgery | Admitting: Neurosurgery

## 2019-07-14 ENCOUNTER — Encounter (HOSPITAL_COMMUNITY)
Admission: RE | Admit: 2019-07-14 | Discharge: 2019-07-14 | Disposition: A | Discharge status: Home / Self Care | Payer: Medicare Other | Source: Ambulatory Visit | Attending: Neurosurgery | Admitting: Neurosurgery

## 2019-07-14 ENCOUNTER — Other Ambulatory Visit: Payer: Self-pay

## 2019-07-14 ENCOUNTER — Encounter (HOSPITAL_COMMUNITY): Payer: Self-pay

## 2019-07-14 DIAGNOSIS — F329 Major depressive disorder, single episode, unspecified: Secondary | ICD-10-CM | POA: Insufficient documentation

## 2019-07-14 DIAGNOSIS — K219 Gastro-esophageal reflux disease without esophagitis: Secondary | ICD-10-CM | POA: Insufficient documentation

## 2019-07-14 DIAGNOSIS — J449 Chronic obstructive pulmonary disease, unspecified: Secondary | ICD-10-CM | POA: Insufficient documentation

## 2019-07-14 DIAGNOSIS — E039 Hypothyroidism, unspecified: Secondary | ICD-10-CM | POA: Insufficient documentation

## 2019-07-14 DIAGNOSIS — R011 Cardiac murmur, unspecified: Secondary | ICD-10-CM | POA: Insufficient documentation

## 2019-07-14 DIAGNOSIS — Z79899 Other long term (current) drug therapy: Secondary | ICD-10-CM | POA: Insufficient documentation

## 2019-07-14 DIAGNOSIS — E669 Obesity, unspecified: Secondary | ICD-10-CM | POA: Insufficient documentation

## 2019-07-14 DIAGNOSIS — I2510 Atherosclerotic heart disease of native coronary artery without angina pectoris: Secondary | ICD-10-CM | POA: Insufficient documentation

## 2019-07-14 DIAGNOSIS — Z6831 Body mass index (BMI) 31.0-31.9, adult: Secondary | ICD-10-CM | POA: Insufficient documentation

## 2019-07-14 DIAGNOSIS — G5603 Carpal tunnel syndrome, bilateral upper limbs: Secondary | ICD-10-CM | POA: Insufficient documentation

## 2019-07-14 DIAGNOSIS — M1990 Unspecified osteoarthritis, unspecified site: Secondary | ICD-10-CM | POA: Insufficient documentation

## 2019-07-14 DIAGNOSIS — I10 Essential (primary) hypertension: Secondary | ICD-10-CM | POA: Insufficient documentation

## 2019-07-14 DIAGNOSIS — M109 Gout, unspecified: Secondary | ICD-10-CM | POA: Insufficient documentation

## 2019-07-14 DIAGNOSIS — I4892 Unspecified atrial flutter: Secondary | ICD-10-CM | POA: Insufficient documentation

## 2019-07-14 DIAGNOSIS — Z20822 Contact with and (suspected) exposure to covid-19: Secondary | ICD-10-CM | POA: Insufficient documentation

## 2019-07-14 DIAGNOSIS — Z01812 Encounter for preprocedural laboratory examination: Secondary | ICD-10-CM | POA: Insufficient documentation

## 2019-07-14 DIAGNOSIS — Z7901 Long term (current) use of anticoagulants: Secondary | ICD-10-CM | POA: Insufficient documentation

## 2019-07-14 HISTORY — DX: Depression, unspecified: F32.A

## 2019-07-14 HISTORY — DX: Pneumonia, unspecified organism: J18.9

## 2019-07-14 LAB — BASIC METABOLIC PANEL
Anion gap: 11 (ref 5–15)
BUN: 28 mg/dL — ABNORMAL HIGH (ref 8–23)
CO2: 25 mmol/L (ref 22–32)
Calcium: 9.7 mg/dL (ref 8.9–10.3)
Chloride: 103 mmol/L (ref 98–111)
Creatinine, Ser: 1.56 mg/dL — ABNORMAL HIGH (ref 0.61–1.24)
GFR calc Af Amer: 49 mL/min — ABNORMAL LOW (ref >60)
GFR calc non Af Amer: 42 mL/min — ABNORMAL LOW (ref >60)
Glucose, Bld: 90 mg/dL (ref 70–99)
Potassium: 4.8 mmol/L (ref 3.5–5.1)
Sodium: 139 mmol/L (ref 135–145)

## 2019-07-14 LAB — CBC
HCT: 40.8 % (ref 39.0–52.0)
Hemoglobin: 13.3 g/dL (ref 13.0–17.0)
MCH: 34.4 pg — ABNORMAL HIGH (ref 26.0–34.0)
MCHC: 32.6 g/dL (ref 30.0–36.0)
MCV: 105.4 fL — ABNORMAL HIGH (ref 80.0–100.0)
Platelets: 210 10*3/uL (ref 150–400)
RBC: 3.87 MIL/uL — ABNORMAL LOW (ref 4.22–5.81)
RDW: 14.3 % (ref 11.5–15.5)
WBC: 7.5 10*3/uL (ref 4.0–10.5)
nRBC: 0 % (ref 0.0–0.2)

## 2019-07-14 LAB — TYPE AND SCREEN
ABO/RH(D): O POS
Antibody Screen: NEGATIVE

## 2019-07-14 LAB — ABO/RH: ABO/RH(D): O POS

## 2019-07-14 LAB — SURGICAL PCR SCREEN
MRSA, PCR: NEGATIVE
Staphylococcus aureus: NEGATIVE

## 2019-07-14 NOTE — Progress Notes (Signed)
PCP - Marena Chancy of [place] name goes to [facility] Cardiologist - Dr. Chancy Milroy in [place]  Chest x-ray - not needed EKG - 11/29/18 Stress Test - Denies ECHO - 06/27/18 Cardiac Cath - 08/29/88  Sleep Study - Prior CPAP - No  DM - Denies  COVID TEST- 07/14/19  Anesthesia review: Yes Cardiac hx  Patient denies shortness of breath, fever, cough and chest pain at PAT appointment   All instructions explained to the patient, with a verbal understanding of the material. Patient agrees to go over the instructions while at home for a better understanding. Patient also instructed to self quarantine after being tested for COVID-19. The opportunity to ask questions was provided.

## 2019-07-15 ENCOUNTER — Other Ambulatory Visit: Payer: Self-pay | Admitting: Neurosurgery

## 2019-07-15 ENCOUNTER — Encounter (HOSPITAL_COMMUNITY): Payer: Self-pay

## 2019-07-15 LAB — SARS CORONAVIRUS 2 (TAT 6-24 HRS): SARS Coronavirus 2: NEGATIVE

## 2019-07-15 NOTE — Anesthesia Preprocedure Evaluation (Addendum)
Anesthesia Evaluation  Patient identified by MRN, date of birth, ID band Patient awake    Reviewed: Allergy & Precautions, NPO status , Patient's Chart, lab work & pertinent test results  History of Anesthesia Complications Negative for: history of anesthetic complications  Airway Mallampati: II  TM Distance: >3 FB Neck ROM: Full    Dental  (+) Missing, Poor Dentition, Dental Advisory Given   Pulmonary COPD,    Pulmonary exam normal        Cardiovascular hypertension, + CAD  Normal cardiovascular exam  CV: Echo 11/22/17 ([place]): Impression: Technically difficult study due to body habitus. Moderately dilated left atrium (unable to visualize right atrium) with ventricles and aorta appear normal in size. Normal LV systolic function.  EF 66%. Normal wall motion. Mild left ventricular perjury with grade 1 (relaxation abnormality) diastolic dysfunction. Trace pulmonary regurgitation. Mild tricuspid regurgitation. Normal pulmonary artery pressure. Mild mitral regurgitation. No pericardial effusion.   Neuro/Psych PSYCHIATRIC DISORDERS Depression negative neurological ROS     GI/Hepatic Neg liver ROS, hiatal hernia, GERD  ,  Endo/Other  Hypothyroidism   Renal/GU Renal InsufficiencyRenal disease     Musculoskeletal negative musculoskeletal ROS (+)   Abdominal   Peds  Hematology negative hematology ROS (+)   Anesthesia Other Findings   Reproductive/Obstetrics                           Anesthesia Physical Anesthesia Plan  ASA: III  Anesthesia Plan: General   Post-op Pain Management:    Induction: Intravenous  PONV Risk Score and Plan: 4 or greater and Treatment may vary due to age or medical condition, Ondansetron and Dexamethasone  Airway Management Planned: Oral ETT and Video Laryngoscope Planned  Additional Equipment:   Intra-op Plan:   Post-operative Plan:  Extubation in OR  Informed Consent: I have reviewed the patients History and Physical, chart, labs and discussed the procedure including the risks, benefits and alternatives for the proposed anesthesia with the patient or authorized representative who has indicated his/her understanding and acceptance.     Dental advisory given  Plan Discussed with: Anesthesiologist and CRNA  Anesthesia Plan Comments: (See PAT note written 07/15/2019 by Myra Gianotti, PA-C. For updated EKG on the day of surgery.  )      Anesthesia Quick Evaluation

## 2019-07-15 NOTE — Progress Notes (Addendum)
Anesthesia Chart Review:  Case: [id number] Date/Time: 07/16/19 0715   Procedures:      Cervical 3-4 Cervical 5-6 Cervical 6-7 Anterior cervical decompression/discectomy/fusion (N/A ) - 3C     LEFT CARPAL TUNNEL RELEASE (Left )   Anesthesia type: General   Pre-op diagnosis: Cervical myelopathy, Bilateral carpal tunnel syndrome   Location: [facility] OR ROOM 21 / [place] OR   Surgeons: Erline Levine, MD      DISCUSSION: Patient is a 79 year old male scheduled for the above procedure. H  History includes never smoker, HTN, CAD (mild LAD, LCX 2016 Coronary CTA), murmur, exertional dyspnea, COPD (as needed home O2 in 2014; no longer needs), GERD, hiatal hernia, hard of hearing, skin cancer, hypothyroidism, urge incontinence. BMI is consistent with obesity.   Admission 11/19/18-12/04/18 for generalized weakness/falls, hypoxia (89%), tachycardia (ST versus aflutter), fever, anasarca, acute on chronic kidney injury, sepsis in setting of recent treatment for UTI and diagnosed with C. Difficile colitis. ID and GI consulted. General surgery also contacted, but non-surgical management recommended after imaging did not suggest pneumatosis/perforation and showed improvement with medical managment. He was discharged to SNF for short-term rehab. He got established with PCP Volney American, PA-C in December 2020 with referral to neurosurgery following 02/27/19. I have communicated with her about surgery plans. She has not seen him since January but felt COPD was stable at that time.   Cardiologist Dr. Humphrey Rolls signed a note indicating patient was felt to be "Low Risk" for planned surgery. Last visit 09/08/18. Requested 2019 echo.   I called and spoke with Mr. Omair. 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 He uses home O2 for less than a year, but has not required any since. He denied SOB and chest pain. His activity is somewhat limited to RLE pain (which worsened following his  several week stay in rehab).  His neck also gets "locked" and he has paresthesias in his hands. The only cardiac history he recalls is mild non-obstructive CAD and murmur (denied CHF, "hole in his heart"). He does have a history of intermittent palpitations, but none in a long time. I told patient we would need to repeat his EKG on the day of surgery. He currently has to sleep with his head a little elevated due to his neck. He feels his neck is his primary issue right now. He is intolerant (N/V) with multiple pain medications. He reports urge incontinence is chronic.    Reviewed chart with anesthesiologist Adele Barthel, MD. Repeat EKG on the day of surgery. Anesthesia team to evaluate on the day of surgery. COVID-19 test negative 07/14/19.   VS: BP 122/65   Pulse 68   Temp 36.8 C (Oral)   Resp 20   Ht 5\' 8"  (1.727 m)   Wt 93.2 kg   SpO2 98%   BMI 31.23 kg/m    PROVIDERS: Volney American, PA-C is PCP. First evaluated 01/26/19 to establish care and for hospital follow-up. Last evaluation 02/27/19 with referral to neurosurgery. ([place]) Neoma Laming, MD is cardiologist ([place]) John Giovanni, MD is urologist [facility])   LABS: Preoperative labs noted. Cr 1.56. Cr ~ 1.0-1.6 since 11/2018. Called Creatinine to Janett Billow at Dr. Melven Sartorius office. (all labs ordered are listed, but only abnormal results are displayed)  Labs Reviewed  BASIC METABOLIC PANEL - Abnormal; Notable for the following components:      Result Value   BUN 28 (*)  Creatinine, Ser 1.56 (*)    GFR calc non Af Amer 42 (*)    GFR calc Af Amer 49 (*)    All other components within normal limits  CBC - Abnormal; Notable for the following components:   RBC 3.87 (*)    MCV 105.4 (*)    MCH 34.4 (*)    All other components within normal limits  SURGICAL PCR SCREEN  TYPE AND SCREEN  ABO/RH     IMAGES: CTA chest 11/19/18: IMPRESSION: 1. Very limited  pulmonary artery evaluation. No evidence of main or lobar pulmonary embolism. 2. Aortic and coronary atherosclerosis.   EKG: 11/19/18 03:28:44 (in setting of sepsis):  Atrial flutter at 126 bpm Ventricular bigeminy RSR' in V1 or V2, probably normal variant Confirmed by UNCONFIRMED, DOCTOR ([id number]), editor Mel Almond, Tammy [phone]) on 11/19/2018 10:30:24 AM - Tracing appears to start off in SR then with ventricular bigeminy x4 beats. Later EKG with  runs. Rate improved by discharge.    CV: UPDATE 12/13/17 stress test received on 07/16/19:  Nuclear stress test 12/13/17 [facility]): EKG Results: NSR.  62/min.  0.5 mm ST elevation at baseline in the inferior leads no change with Persantine.  Prominent T waves at baseline. Perfusion/Wall Motion Finidngs: EF equals 77%.  Small mild fixed apical wall defect, normal wall motion. Impression: Equivocal stress test with normal LVEF.    Echo 11/22/17 ([place]): Impression: Technically difficult study due to body habitus. Moderately dilated left atrium (unable to visualize right atrium) with ventricles and aorta appear normal in size. Normal LV systolic function.  EF 66%. Normal wall motion. Mild left ventricular perjury with grade 1 (relaxation abnormality) diastolic dysfunction. Trace pulmonary regurgitation. Mild tricuspid regurgitation. Normal pulmonary artery pressure. Mild mitral regurgitation. No pericardial effusion.   Coronary CCTA 11/25/14 ([place]): 1 - Calcium score is 495.5. 2 - Left dominant system. 3 - Diffuse calcification of LCx proximal to distally with mild diffuse disease.  LAD has proximal to mild diffuse calcification with mild disease.  RCA small nondominant.  Treat medically.   Cardiac cath 08/29/88 ([facility]): Dominance:  left SA node origin:  right AV node origin:  left Left Circumflex     LPL1  (Large)          50% Left Anterior Descending     D2  (Large)            25% Right Coronary Artery      normal Left Main                 normal LV:   Ejection Fraction:   69%  Chamber Size:  Normal  Overall Contraction:  Normal  Ventricular Wall Thickness:  Normal  Ventricular Septal Defect Location and Severity:      Apical VSD:  Moderate  Contraction Pattern:  Normal (2 views) Abdominal Aortogram: Normal  DIAGNOSTIC SUMMARY  Coronary Artery Disease           No significant CAD indicated      Left Main:  normal                  Left Ventriculogram      LAD system:  insignificant             Ejection Fraction:   69%      LCX system:  insignificant          Right Heart Catheterization      RCA system:  normal  Acquired VSD:  Present  COMMENT  (1) A 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  (2) A 50% small PL lesion    is noted in one view. (No mention of VSD on 06/23/13, 11/22/17 echocardiograms or in 2020 cardiology notes.)   Past Medical History:  Diagnosis Date  . Arthritis   . Cancer ([place])    SKIN  . COPD (chronic obstructive pulmonary disease) ([place])   . Coronary artery disease   . Depression   . Dyspnea    DOE  . GERD (gastroesophageal reflux disease)   . Gout   . Heart murmur   . History of hiatal hernia   . History of kidney stones   . History of wheezing   . HOH (hard of hearing)   . Hypertension   . Hypothyroidism   . Oxygen deficit    PRN USE  . Pneumonia   . Renal disorder   . Thyroid disease     Past Surgical History:  Procedure Laterality Date  . CARDIAC CATHETERIZATION    . CATARACT EXTRACTION W/PHACO Right 08/21/2016   Procedure: CATARACT EXTRACTION PHACO AND INTRAOCULAR LENS PLACEMENT (IOC);  Surgeon: Birder Robson, MD;  Location: [facility] ORS;  Service: Ophthalmology;  Laterality: Right;  [place] 00:54.9 AP% 18.3 CDE 10.04 Fluid pack lot # [id number]  . CATARACT EXTRACTION W/PHACO Left 09/18/2016   Procedure: CATARACT EXTRACTION PHACO AND INTRAOCULAR LENS PLACEMENT (IOC);  Surgeon: Birder Robson, MD;  Location: [facility] ORS;   Service: Ophthalmology;  Laterality: Left;  [place] 00:56 AP% 19.7 CDE 11.07 Fluid pack lot # [id number]  . FRACTURE SURGERY     ANKLE  . HERNIA REPAIR    . UPPER GI ENDOSCOPY      MEDICATIONS: . acetaminophen (TYLENOL) 325 MG tablet  . acetaminophen (TYLENOL) 650 MG CR tablet  . allopurinol (ZYLOPRIM) 300 MG tablet  . B Complex-C (SUPER B COMPLEX PO)  . Cholecalciferol (VITAMIN D3) 5000 UNITS TABS  . DULoxetine (CYMBALTA) 30 MG capsule  . DULoxetine (CYMBALTA) 30 MG capsule  . finasteride (PROSCAR) 5 MG tablet  . losartan (COZAAR) 100 MG tablet  . metoprolol succinate (TOPROL-XL) 100 MG 24 hr tablet  . pregabalin (LYRICA) 150 MG capsule  . spironolactone (ALDACTONE) 25 MG tablet  . SYNTHROID 125 MCG tablet  . tamsulosin (FLOMAX) 0.4 MG CAPS capsule   No current facility-administered medications for this encounter.    Myra Gianotti, PA-C Surgical Short Stay/Anesthesiology [facility] Phone [phone] [facility] Phone [phone] 07/15/2019 4:48 PM

## 2019-07-16 ENCOUNTER — Inpatient Hospital Stay (HOSPITAL_COMMUNITY)
Admission: RE | Admit: 2019-07-16 | Discharge: 2019-07-21 | DRG: 455 | Disposition: A | Discharge status: Skilled Nursing Facility | Payer: Medicare Other | Attending: Neurosurgery | Admitting: Neurosurgery

## 2019-07-16 ENCOUNTER — Encounter (HOSPITAL_COMMUNITY)
Admission: RE | Disposition: A | Discharge status: Nursing Home (Includes ICF) | Payer: Self-pay | Source: Home / Self Care | Attending: Neurosurgery

## 2019-07-16 ENCOUNTER — Ambulatory Visit (HOSPITAL_COMMUNITY): Payer: Medicare Other

## 2019-07-16 ENCOUNTER — Ambulatory Visit (HOSPITAL_COMMUNITY): Payer: Medicare Other | Admitting: Physician Assistant

## 2019-07-16 ENCOUNTER — Ambulatory Visit (HOSPITAL_COMMUNITY): Payer: Medicare Other | Admitting: Anesthesiology

## 2019-07-16 ENCOUNTER — Encounter (HOSPITAL_COMMUNITY): Payer: Self-pay | Admitting: Neurosurgery

## 2019-07-16 ENCOUNTER — Other Ambulatory Visit: Payer: Self-pay

## 2019-07-16 DIAGNOSIS — E039 Hypothyroidism, unspecified: Secondary | ICD-10-CM | POA: Diagnosis present

## 2019-07-16 DIAGNOSIS — E669 Obesity, unspecified: Secondary | ICD-10-CM | POA: Diagnosis present

## 2019-07-16 DIAGNOSIS — Z6831 Body mass index (BMI) 31.0-31.9, adult: Secondary | ICD-10-CM

## 2019-07-16 DIAGNOSIS — H9190 Unspecified hearing loss, unspecified ear: Secondary | ICD-10-CM | POA: Diagnosis present

## 2019-07-16 DIAGNOSIS — M5412 Radiculopathy, cervical region: Secondary | ICD-10-CM | POA: Diagnosis present

## 2019-07-16 DIAGNOSIS — J449 Chronic obstructive pulmonary disease, unspecified: Secondary | ICD-10-CM | POA: Diagnosis present

## 2019-07-16 DIAGNOSIS — I10 Essential (primary) hypertension: Secondary | ICD-10-CM | POA: Diagnosis present

## 2019-07-16 DIAGNOSIS — Z885 Allergy status to narcotic agent status: Secondary | ICD-10-CM

## 2019-07-16 DIAGNOSIS — M109 Gout, unspecified: Secondary | ICD-10-CM | POA: Diagnosis present

## 2019-07-16 DIAGNOSIS — Z9842 Cataract extraction status, left eye: Secondary | ICD-10-CM

## 2019-07-16 DIAGNOSIS — F329 Major depressive disorder, single episode, unspecified: Secondary | ICD-10-CM | POA: Diagnosis present

## 2019-07-16 DIAGNOSIS — Z20822 Contact with and (suspected) exposure to covid-19: Secondary | ICD-10-CM | POA: Diagnosis present

## 2019-07-16 DIAGNOSIS — Z882 Allergy status to sulfonamides status: Secondary | ICD-10-CM

## 2019-07-16 DIAGNOSIS — Z87442 Personal history of urinary calculi: Secondary | ICD-10-CM

## 2019-07-16 DIAGNOSIS — M50023 Cervical disc disorder at C6-C7 level with myelopathy: Principal | ICD-10-CM | POA: Diagnosis present

## 2019-07-16 DIAGNOSIS — Z419 Encounter for procedure for purposes other than remedying health state, unspecified: Secondary | ICD-10-CM

## 2019-07-16 DIAGNOSIS — Z79890 Hormone replacement therapy (postmenopausal): Secondary | ICD-10-CM

## 2019-07-16 DIAGNOSIS — N3941 Urge incontinence: Secondary | ICD-10-CM | POA: Diagnosis present

## 2019-07-16 DIAGNOSIS — Z9841 Cataract extraction status, right eye: Secondary | ICD-10-CM

## 2019-07-16 DIAGNOSIS — Z79899 Other long term (current) drug therapy: Secondary | ICD-10-CM

## 2019-07-16 DIAGNOSIS — G5603 Carpal tunnel syndrome, bilateral upper limbs: Secondary | ICD-10-CM | POA: Diagnosis present

## 2019-07-16 DIAGNOSIS — Z961 Presence of intraocular lens: Secondary | ICD-10-CM | POA: Diagnosis present

## 2019-07-16 DIAGNOSIS — Z888 Allergy status to other drugs, medicaments and biological substances status: Secondary | ICD-10-CM

## 2019-07-16 DIAGNOSIS — K219 Gastro-esophageal reflux disease without esophagitis: Secondary | ICD-10-CM | POA: Diagnosis present

## 2019-07-16 DIAGNOSIS — Z751 Person awaiting admission to adequate facility elsewhere: Secondary | ICD-10-CM

## 2019-07-16 DIAGNOSIS — M4802 Spinal stenosis, cervical region: Secondary | ICD-10-CM | POA: Diagnosis present

## 2019-07-16 DIAGNOSIS — G959 Disease of spinal cord, unspecified: Secondary | ICD-10-CM | POA: Diagnosis present

## 2019-07-16 DIAGNOSIS — Z85828 Personal history of other malignant neoplasm of skin: Secondary | ICD-10-CM

## 2019-07-16 DIAGNOSIS — I2510 Atherosclerotic heart disease of native coronary artery without angina pectoris: Secondary | ICD-10-CM | POA: Diagnosis present

## 2019-07-16 HISTORY — PX: CARPAL TUNNEL RELEASE: SHX101

## 2019-07-16 HISTORY — PX: ANTERIOR CERVICAL DECOMP/DISCECTOMY FUSION: SHX1161

## 2019-07-16 SURGERY — ANTERIOR CERVICAL DECOMPRESSION/DISCECTOMY FUSION 3 LEVELS
Anesthesia: General

## 2019-07-16 MED ORDER — PROPOFOL 10 MG/ML IV BOLUS
INTRAVENOUS | Status: DC | PRN
  Administered 2019-07-16: 130 mg via INTRAVENOUS

## 2019-07-16 MED ORDER — ROCURONIUM BROMIDE 100 MG/10ML IV SOLN
INTRAVENOUS | Status: DC | PRN
  Administered 2019-07-16: 100 mg via INTRAVENOUS

## 2019-07-16 MED ORDER — LIDOCAINE HCL 1 % IJ SOLN
INTRAMUSCULAR | Status: AC
  Filled 2019-07-16: qty 20

## 2019-07-16 MED ORDER — METHOCARBAMOL 500 MG PO TABS
500.0000 mg | ORAL_TABLET | Freq: Four times a day (QID) | ORAL | Status: DC | PRN
  Administered 2019-07-16 (×2): 500 mg via ORAL
  Filled 2019-07-16: qty 1

## 2019-07-16 MED ORDER — ZOLPIDEM TARTRATE 5 MG PO TABS: 5.0000 mg | ORAL_TABLET | Freq: Every evening | ORAL | Status: DC | PRN

## 2019-07-16 MED ORDER — ONDANSETRON HCL 4 MG/2ML IJ SOLN
INTRAMUSCULAR | Status: DC | PRN
  Administered 2019-07-16: 4 mg via INTRAVENOUS

## 2019-07-16 MED ORDER — DULOXETINE HCL 30 MG PO CPEP
30.0000 mg | ORAL_CAPSULE | Freq: Every day | ORAL | Status: DC
  Administered 2019-07-16 – 2019-07-21 (×6): 30 mg via ORAL
  Filled 2019-07-16 (×6): qty 1

## 2019-07-16 MED ORDER — ONDANSETRON HCL 4 MG PO TABS
4.0000 mg | ORAL_TABLET | Freq: Four times a day (QID) | ORAL | Status: DC | PRN
  Administered 2019-07-16: 4 mg via ORAL
  Filled 2019-07-16: qty 1

## 2019-07-16 MED ORDER — LOSARTAN POTASSIUM 50 MG PO TABS
100.0000 mg | ORAL_TABLET | Freq: Every day | ORAL | Status: DC
  Administered 2019-07-16 – 2019-07-21 (×6): 100 mg via ORAL
  Filled 2019-07-16 (×7): qty 2

## 2019-07-16 MED ORDER — PANTOPRAZOLE SODIUM 40 MG PO TBEC
40.0000 mg | DELAYED_RELEASE_TABLET | Freq: Every day | ORAL | Status: DC
  Administered 2019-07-16 – 2019-07-20 (×5): 40 mg via ORAL
  Filled 2019-07-16 (×4): qty 1

## 2019-07-16 MED ORDER — CHLORHEXIDINE GLUCONATE CLOTH 2 % EX PADS: 6.0000 | MEDICATED_PAD | Freq: Once | CUTANEOUS | Status: DC

## 2019-07-16 MED ORDER — LIDOCAINE HCL (PF) 0.5 % IJ SOLN
INTRAMUSCULAR | Status: AC
  Filled 2019-07-16: qty 50

## 2019-07-16 MED ORDER — METHOCARBAMOL 1000 MG/10ML IJ SOLN
500.0000 mg | Freq: Four times a day (QID) | INTRAVENOUS | Status: DC | PRN
  Filled 2019-07-16: qty 5

## 2019-07-16 MED ORDER — METOPROLOL SUCCINATE ER 50 MG PO TB24
100.0000 mg | ORAL_TABLET | Freq: Every day | ORAL | Status: DC
  Administered 2019-07-16 – 2019-07-21 (×6): 100 mg via ORAL
  Filled 2019-07-16 (×2): qty 2
  Filled 2019-07-16: qty 1
  Filled 2019-07-16 (×3): qty 2

## 2019-07-16 MED ORDER — BISACODYL 10 MG RE SUPP: 10.0000 mg | Freq: Every day | RECTAL | Status: DC | PRN

## 2019-07-16 MED ORDER — CEFAZOLIN SODIUM-DEXTROSE 2-4 GM/100ML-% IV SOLN
2.0000 g | Freq: Three times a day (TID) | INTRAVENOUS | Status: AC
  Administered 2019-07-16 (×2): 2 g via INTRAVENOUS
  Filled 2019-07-16 (×2): qty 100

## 2019-07-16 MED ORDER — ORAL CARE MOUTH RINSE: 15.0000 mL | Freq: Once | OROMUCOSAL | Status: AC

## 2019-07-16 MED ORDER — SODIUM CHLORIDE 0.9 % IV SOLN: 250.0000 mL | INTRAVENOUS | Status: DC

## 2019-07-16 MED ORDER — THROMBIN 20000 UNITS EX SOLR
CUTANEOUS | Status: AC
  Filled 2019-07-16: qty 20000

## 2019-07-16 MED ORDER — SUGAMMADEX SODIUM 200 MG/2ML IV SOLN
INTRAVENOUS | Status: DC | PRN
Start: 2019-07-16 — End: 2019-07-16
  Administered 2019-07-16: 200 mg via INTRAVENOUS

## 2019-07-16 MED ORDER — THROMBIN 5000 UNITS EX SOLR
CUTANEOUS | Status: AC
  Filled 2019-07-16: qty 5000

## 2019-07-16 MED ORDER — SPIRONOLACTONE 25 MG PO TABS
25.0000 mg | ORAL_TABLET | Freq: Every day | ORAL | Status: DC
  Administered 2019-07-16 – 2019-07-21 (×6): 25 mg via ORAL
  Filled 2019-07-16 (×6): qty 1

## 2019-07-16 MED ORDER — POLYETHYLENE GLYCOL 3350 17 G PO PACK: 17.0000 g | PACK | Freq: Every day | ORAL | Status: DC | PRN

## 2019-07-16 MED ORDER — 0.9 % SODIUM CHLORIDE (POUR BTL) OPTIME
TOPICAL | Status: DC | PRN
  Administered 2019-07-16 (×2): 1000 mL

## 2019-07-16 MED ORDER — PREGABALIN 75 MG PO CAPS
150.0000 mg | ORAL_CAPSULE | Freq: Two times a day (BID) | ORAL | Status: DC
  Administered 2019-07-16 – 2019-07-21 (×11): 150 mg via ORAL
  Filled 2019-07-16 (×11): qty 2

## 2019-07-16 MED ORDER — ACETAMINOPHEN 500 MG PO TABS
1000.0000 mg | ORAL_TABLET | Freq: Once | ORAL | Status: AC
  Administered 2019-07-16: 1000 mg via ORAL

## 2019-07-16 MED ORDER — CHLORHEXIDINE GLUCONATE 0.12 % MT SOLN: 15.0000 mL | Freq: Once | OROMUCOSAL | Status: AC

## 2019-07-16 MED ORDER — FENTANYL CITRATE (PF) 250 MCG/5ML IJ SOLN
INTRAMUSCULAR | Status: DC | PRN
  Administered 2019-07-16 (×5): 50 ug via INTRAVENOUS

## 2019-07-16 MED ORDER — KCL IN DEXTROSE-NACL 20-5-0.45 MEQ/L-%-% IV SOLN: INTRAVENOUS | Status: DC

## 2019-07-16 MED ORDER — CELECOXIB 200 MG PO CAPS
200.0000 mg | ORAL_CAPSULE | Freq: Once | ORAL | Status: AC
  Administered 2019-07-16: 200 mg via ORAL

## 2019-07-16 MED ORDER — FENTANYL CITRATE (PF) 100 MCG/2ML IJ SOLN
INTRAMUSCULAR | Status: AC
  Filled 2019-07-16: qty 2

## 2019-07-16 MED ORDER — VITAMIN D 25 MCG (1000 UNIT) PO TABS
5000.0000 [IU] | ORAL_TABLET | Freq: Every day | ORAL | Status: DC
  Administered 2019-07-16 – 2019-07-21 (×6): 5000 [IU] via ORAL
  Filled 2019-07-16 (×9): qty 5

## 2019-07-16 MED ORDER — LIDOCAINE HCL (PF) 1 % IJ SOLN
INTRAMUSCULAR | Status: DC | PRN
  Administered 2019-07-16: 4 mL

## 2019-07-16 MED ORDER — LIDOCAINE-EPINEPHRINE 1 %-1:100000 IJ SOLN
INTRAMUSCULAR | Status: AC
  Filled 2019-07-16: qty 1

## 2019-07-16 MED ORDER — HYDROMORPHONE HCL 1 MG/ML IJ SOLN: 0.5000 mg | INTRAMUSCULAR | Status: DC | PRN

## 2019-07-16 MED ORDER — FENTANYL CITRATE (PF) 250 MCG/5ML IJ SOLN
INTRAMUSCULAR | Status: AC
  Filled 2019-07-16: qty 5

## 2019-07-16 MED ORDER — METHOCARBAMOL 500 MG PO TABS
ORAL_TABLET | ORAL | Status: AC
  Filled 2019-07-16: qty 1

## 2019-07-16 MED ORDER — PROMETHAZINE HCL 25 MG/ML IJ SOLN
INTRAMUSCULAR | Status: AC
  Filled 2019-07-16: qty 1

## 2019-07-16 MED ORDER — HYDROCODONE-ACETAMINOPHEN 5-325 MG PO TABS: 1.0000 | ORAL_TABLET | ORAL | Status: DC | PRN

## 2019-07-16 MED ORDER — PHENYLEPHRINE HCL-NACL 10-0.9 MG/250ML-% IV SOLN
INTRAVENOUS | Status: DC | PRN
Start: 2019-07-16 — End: 2019-07-16
  Administered 2019-07-16: 40 ug/min via INTRAVENOUS

## 2019-07-16 MED ORDER — PROMETHAZINE HCL 25 MG/ML IJ SOLN
6.2500 mg | INTRAMUSCULAR | Status: DC | PRN
  Administered 2019-07-16: 6.25 mg via INTRAVENOUS

## 2019-07-16 MED ORDER — DOCUSATE SODIUM 100 MG PO CAPS
100.0000 mg | ORAL_CAPSULE | Freq: Two times a day (BID) | ORAL | Status: DC
  Administered 2019-07-16 – 2019-07-20 (×10): 100 mg via ORAL
  Filled 2019-07-16 (×11): qty 1

## 2019-07-16 MED ORDER — CEFAZOLIN SODIUM-DEXTROSE 2-4 GM/100ML-% IV SOLN: 2.0000 g | INTRAVENOUS | Status: DC

## 2019-07-16 MED ORDER — ACETAMINOPHEN 500 MG PO TABS
1000.0000 mg | ORAL_TABLET | Freq: Three times a day (TID) | ORAL | Status: DC | PRN
  Administered 2019-07-17 – 2019-07-20 (×3): 1000 mg via ORAL
  Filled 2019-07-16 (×3): qty 2

## 2019-07-16 MED ORDER — PROPOFOL 10 MG/ML IV BOLUS
INTRAVENOUS | Status: AC
  Filled 2019-07-16: qty 20

## 2019-07-16 MED ORDER — HYDROCODONE-ACETAMINOPHEN 5-325 MG PO TABS
2.0000 | ORAL_TABLET | ORAL | Status: DC | PRN
  Administered 2019-07-16 (×2): 2 via ORAL
  Filled 2019-07-16 (×2): qty 2

## 2019-07-16 MED ORDER — ONDANSETRON HCL 4 MG/2ML IJ SOLN: 4.0000 mg | Freq: Four times a day (QID) | INTRAMUSCULAR | Status: DC | PRN

## 2019-07-16 MED ORDER — DEXAMETHASONE SODIUM PHOSPHATE 10 MG/ML IJ SOLN
INTRAMUSCULAR | Status: DC | PRN
  Administered 2019-07-16: 10 mg via INTRAVENOUS

## 2019-07-16 MED ORDER — THROMBIN 20000 UNITS EX SOLR
CUTANEOUS | Status: DC | PRN
  Administered 2019-07-16: 20 mL via TOPICAL

## 2019-07-16 MED ORDER — TAMSULOSIN HCL 0.4 MG PO CAPS
0.4000 mg | ORAL_CAPSULE | Freq: Every day | ORAL | Status: DC
  Administered 2019-07-16 – 2019-07-21 (×6): 0.4 mg via ORAL
  Filled 2019-07-16 (×6): qty 1

## 2019-07-16 MED ORDER — ACETAMINOPHEN 650 MG RE SUPP: 650.0000 mg | RECTAL | Status: DC | PRN

## 2019-07-16 MED ORDER — ALUM & MAG HYDROXIDE-SIMETH 200-200-20 MG/5ML PO SUSP: 30.0000 mL | Freq: Four times a day (QID) | ORAL | Status: DC | PRN

## 2019-07-16 MED ORDER — BUPIVACAINE HCL (PF) 0.5 % IJ SOLN
INTRAMUSCULAR | Status: AC
  Filled 2019-07-16: qty 30

## 2019-07-16 MED ORDER — THROMBIN 5000 UNITS EX SOLR: OROMUCOSAL | Status: DC | PRN

## 2019-07-16 MED ORDER — LIDOCAINE-EPINEPHRINE 1 %-1:100000 IJ SOLN
INTRAMUSCULAR | Status: DC | PRN
  Administered 2019-07-16: 5 mL

## 2019-07-16 MED ORDER — PHENOL 1.4 % MT LIQD: 1.0000 | OROMUCOSAL | Status: DC | PRN

## 2019-07-16 MED ORDER — PANTOPRAZOLE SODIUM 40 MG IV SOLR: 40.0000 mg | Freq: Every day | INTRAVENOUS | Status: DC

## 2019-07-16 MED ORDER — MENTHOL 3 MG MT LOZG: 1.0000 | LOZENGE | OROMUCOSAL | Status: DC | PRN

## 2019-07-16 MED ORDER — SODIUM CHLORIDE 0.9% FLUSH
3.0000 mL | Freq: Two times a day (BID) | INTRAVENOUS | Status: DC
  Administered 2019-07-16 – 2019-07-21 (×9): 3 mL via INTRAVENOUS

## 2019-07-16 MED ORDER — BUPIVACAINE HCL (PF) 0.5 % IJ SOLN
INTRAMUSCULAR | Status: DC | PRN
  Administered 2019-07-16: 5 mL

## 2019-07-16 MED ORDER — CHLORHEXIDINE GLUCONATE 0.12 % MT SOLN
OROMUCOSAL | Status: AC
  Administered 2019-07-16: 15 mL via OROMUCOSAL
  Filled 2019-07-16: qty 15

## 2019-07-16 MED ORDER — LIDOCAINE HCL (CARDIAC) PF 100 MG/5ML IV SOSY
PREFILLED_SYRINGE | INTRAVENOUS | Status: DC | PRN
  Administered 2019-07-16: 100 mg via INTRATRACHEAL

## 2019-07-16 MED ORDER — ACETAMINOPHEN 325 MG PO TABS
650.0000 mg | ORAL_TABLET | ORAL | Status: DC | PRN
  Administered 2019-07-19 – 2019-07-20 (×2): 650 mg via ORAL
  Filled 2019-07-16 (×2): qty 2

## 2019-07-16 MED ORDER — CEFAZOLIN SODIUM-DEXTROSE 2-4 GM/100ML-% IV SOLN
2.0000 g | INTRAVENOUS | Status: AC
  Administered 2019-07-16: 2 g via INTRAVENOUS

## 2019-07-16 MED ORDER — FENTANYL CITRATE (PF) 100 MCG/2ML IJ SOLN
25.0000 ug | INTRAMUSCULAR | Status: DC | PRN
  Administered 2019-07-16: 25 ug via INTRAVENOUS

## 2019-07-16 MED ORDER — MIDAZOLAM HCL 2 MG/2ML IJ SOLN
INTRAMUSCULAR | Status: AC
  Filled 2019-07-16: qty 2

## 2019-07-16 MED ORDER — LEVOTHYROXINE SODIUM 25 MCG PO TABS
125.0000 ug | ORAL_TABLET | Freq: Every day | ORAL | Status: DC
  Administered 2019-07-17 – 2019-07-21 (×5): 125 ug via ORAL
  Filled 2019-07-16 (×5): qty 1

## 2019-07-16 MED ORDER — FINASTERIDE 5 MG PO TABS
5.0000 mg | ORAL_TABLET | Freq: Every day | ORAL | Status: DC
  Administered 2019-07-17 – 2019-07-21 (×5): 5 mg via ORAL
  Filled 2019-07-16 (×6): qty 1

## 2019-07-16 MED ORDER — FLEET ENEMA 7-19 GM/118ML RE ENEM: 1.0000 | ENEMA | Freq: Once | RECTAL | Status: DC | PRN

## 2019-07-16 MED ORDER — SODIUM CHLORIDE 0.9% FLUSH
3.0000 mL | INTRAVENOUS | Status: DC | PRN
  Administered 2019-07-19: 3 mL via INTRAVENOUS

## 2019-07-16 MED ORDER — LACTATED RINGERS IV SOLN: INTRAVENOUS | Status: DC | PRN

## 2019-07-16 SURGICAL SUPPLY — 87 items
BAND RUBBER #18 3X1/16 STRL (MISCELLANEOUS) ×8 IMPLANT
BASKET BONE COLLECTION (BASKET) ×2 IMPLANT
BIT DRILL 14X2.5XNS TI ANT (BIT) IMPLANT
BIT DRILL AVIATOR 14 (BIT) ×1
BIT DRILL AVIATOR 14MM (BIT) ×1
BIT DRILL NEURO 2X3.1 SFT TUCH (MISCELLANEOUS) ×2 IMPLANT
BIT DRL 14X2.5XNS TI ANT (BIT) ×2
BLADE SURG 15 STRL LF DISP TIS (BLADE) ×2 IMPLANT
BLADE SURG 15 STRL SS (BLADE) ×2
BLADE ULTRA TIP 2M (BLADE) IMPLANT
BNDG ELASTIC 4X5.8 VLCR STR LF (GAUZE/BANDAGES/DRESSINGS) ×2 IMPLANT
BNDG GAUZE ELAST 4 BULKY (GAUZE/BANDAGES/DRESSINGS) ×4 IMPLANT
BNDG STRETCH 4X75 STRL LF (GAUZE/BANDAGES/DRESSINGS) ×4 IMPLANT
BUR BARREL STRAIGHT FLUTE 4.0 (BURR) ×6 IMPLANT
CABLE BIPOLOR RESECTION CORD (MISCELLANEOUS) ×4 IMPLANT
CANISTER SUCT 3000ML PPV (MISCELLANEOUS) ×4 IMPLANT
CARTRIDGE OIL MAESTRO DRILL (MISCELLANEOUS) ×2 IMPLANT
COVER MAYO STAND STRL (DRAPES) ×4 IMPLANT
COVER WAND RF STERILE (DRAPES) ×6 IMPLANT
DECANTER SPIKE VIAL GLASS SM (MISCELLANEOUS) ×8 IMPLANT
DERMABOND ADVANCED (GAUZE/BANDAGES/DRESSINGS) ×2
DERMABOND ADVANCED .7 DNX12 (GAUZE/BANDAGES/DRESSINGS) ×2 IMPLANT
DIFFUSER DRILL AIR PNEUMATIC (MISCELLANEOUS) ×4 IMPLANT
DRAIN JACKSON PRATT 10MM FLAT (MISCELLANEOUS) ×2 IMPLANT
DRAPE EXTREMITY T 121X128X90 (DISPOSABLE) ×4 IMPLANT
DRAPE HALF SHEET 40X57 (DRAPES) ×4 IMPLANT
DRAPE LAPAROTOMY 100X72 PEDS (DRAPES) ×4 IMPLANT
DRAPE MICROSCOPE LEICA (MISCELLANEOUS) ×4 IMPLANT
DRILL NEURO 2X3.1 SOFT TOUCH (MISCELLANEOUS) ×4
DRSG EMULSION OIL 3X3 NADH (GAUZE/BANDAGES/DRESSINGS) ×4 IMPLANT
DRSG OPSITE POSTOP 4X6 (GAUZE/BANDAGES/DRESSINGS) ×2 IMPLANT
DURAPREP 6ML APPLICATOR 50/CS (WOUND CARE) ×6 IMPLANT
ELECT COATED BLADE 2.86 ST (ELECTRODE) ×4 IMPLANT
ELECT REM PT RETURN 9FT ADLT (ELECTROSURGICAL) ×4
ELECTRODE REM PT RTRN 9FT ADLT (ELECTROSURGICAL) ×2 IMPLANT
EVACUATOR SILICONE 100CC (DRAIN) ×2 IMPLANT
GAUZE 4X4 16PLY RFD (DISPOSABLE) ×4 IMPLANT
GAUZE SPONGE 4X4 12PLY STRL (GAUZE/BANDAGES/DRESSINGS) ×4 IMPLANT
GLOVE BIO SURGEON STRL SZ8 (GLOVE) ×12 IMPLANT
GLOVE BIOGEL PI IND STRL 8 (GLOVE) ×2 IMPLANT
GLOVE BIOGEL PI IND STRL 8.5 (GLOVE) ×4 IMPLANT
GLOVE BIOGEL PI INDICATOR 8 (GLOVE) ×2
GLOVE BIOGEL PI INDICATOR 8.5 (GLOVE) ×4
GLOVE ECLIPSE 7.5 STRL STRAW (GLOVE) ×10 IMPLANT
GLOVE ECLIPSE 8.0 STRL XLNG CF (GLOVE) ×6 IMPLANT
GLOVE EXAM NITRILE XL STR (GLOVE) IMPLANT
GLOVE SURG SS PI 7.5 STRL IVOR (GLOVE) ×6 IMPLANT
GLOVE SURG SS PI 8.5 STRL IVOR (GLOVE) ×4
GLOVE SURG SS PI 8.5 STRL STRW (GLOVE) IMPLANT
GOWN STRL REUS W/ TWL LRG LVL3 (GOWN DISPOSABLE) ×2 IMPLANT
GOWN STRL REUS W/ TWL XL LVL3 (GOWN DISPOSABLE) ×2 IMPLANT
GOWN STRL REUS W/TWL 2XL LVL3 (GOWN DISPOSABLE) ×8 IMPLANT
GOWN STRL REUS W/TWL LRG LVL3 (GOWN DISPOSABLE)
GOWN STRL REUS W/TWL XL LVL3 (GOWN DISPOSABLE) ×6
HALTER HD/CHIN CERV TRACTION D (MISCELLANEOUS) ×4 IMPLANT
HEMOSTAT POWDER KIT SURGIFOAM (HEMOSTASIS) ×4 IMPLANT
KIT BASIN OR (CUSTOM PROCEDURE TRAY) ×8 IMPLANT
KIT TURNOVER KIT B (KITS) ×8 IMPLANT
NDL HYPO 25X1 1.5 SAFETY (NEEDLE) ×4 IMPLANT
NDL SPNL 18GX3.5 QUINCKE PK (NEEDLE) IMPLANT
NDL SPNL 22GX3.5 QUINCKE BK (NEEDLE) ×2 IMPLANT
NEEDLE HYPO 25X1 1.5 SAFETY (NEEDLE) ×8 IMPLANT
NEEDLE SPNL 18GX3.5 QUINCKE PK (NEEDLE) ×4 IMPLANT
NEEDLE SPNL 22GX3.5 QUINCKE BK (NEEDLE) ×8 IMPLANT
NS IRRIG 1000ML POUR BTL (IV SOLUTION) ×8 IMPLANT
OIL CARTRIDGE MAESTRO DRILL (MISCELLANEOUS) ×4
PACK LAMINECTOMY NEURO (CUSTOM PROCEDURE TRAY) ×4 IMPLANT
PACK SURGICAL SETUP 50X90 (CUSTOM PROCEDURE TRAY) ×4 IMPLANT
PAD ARMBOARD 7.5X6 YLW CONV (MISCELLANEOUS) ×12 IMPLANT
PIN DISTRACTION 14MM (PIN) ×10 IMPLANT
PLATE AVIATOR ASSY 1LVL SZ 12 (Plate) ×2 IMPLANT
PLATE AVIATOR ASSY 2LVL SZ 28 (Plate) ×2 IMPLANT
SCREW AVIATOR VAR SELFTAP 4X14 (Screw) ×20 IMPLANT
SPACER CERV AVS 14X16X5MM 4D (Spacer) ×6 IMPLANT
SPONGE INTESTINAL PEANUT (DISPOSABLE) ×4 IMPLANT
SPONGE SURGIFOAM ABS GEL 100 (HEMOSTASIS) ×2 IMPLANT
STAPLER SKIN PROX WIDE 3.9 (STAPLE) IMPLANT
STOCKINETTE 4X48 STRL (DRAPES) ×4 IMPLANT
SUT ETHILON 3 0 PS 1 (SUTURE) ×4 IMPLANT
SUT VIC AB 3-0 SH 8-18 (SUTURE) ×6 IMPLANT
SYR BULB EAR ULCER 3OZ GRN STR (SYRINGE) ×4 IMPLANT
SYR CONTROL 10ML LL (SYRINGE) ×4 IMPLANT
TOWEL GREEN STERILE (TOWEL DISPOSABLE) ×6 IMPLANT
TOWEL GREEN STERILE FF (TOWEL DISPOSABLE) ×8 IMPLANT
TRAY FOLEY MTR SLVR 16FR STAT (SET/KITS/TRAYS/PACK) ×2 IMPLANT
UNDERPAD 30X36 HEAVY ABSORB (UNDERPADS AND DIAPERS) ×4 IMPLANT
WATER STERILE IRR 1000ML POUR (IV SOLUTION) ×6 IMPLANT

## 2019-07-16 NOTE — Care Management Obs Status (Signed)
[place] NOTIFICATION   Patient Details  Name: Bobby Lane MRN: [id number] Date of Birth: [birth year]   Medicare Observation Status Notification Given:  Yes    Angelita Ingles, RN 07/16/2019, 1:45 PM

## 2019-07-16 NOTE — Transfer of Care (Signed)
Immediate Anesthesia Transfer of Care Note  Patient: Bobby Lane  Procedure(s) Performed: Cervical three-four Cervical five-six Cervical six-seven Anterior cervical decompression/discectomy/fusion (N/A ) LEFT CARPAL TUNNEL RELEASE (Left )  Patient Location: PACU  Anesthesia Type:General  Level of Consciousness: awake, alert  and oriented  Airway & Oxygen Therapy: Patient Spontanous Breathing and Patient connected to nasal cannula oxygen  Post-op Assessment: Report given to RN, Post -op Vital signs reviewed and stable and Patient moving all extremities X 4  Post vital signs: Reviewed and stable  Last Vitals:  Vitals Value Taken Time  BP    Temp 36.6 C 07/16/19 1137  Pulse 80 07/16/19 1139  Resp 11 07/16/19 1139  SpO2 98 % 07/16/19 1139  Vitals shown include unvalidated device data.  Last Pain:  Vitals:   07/16/19 0627  TempSrc:   PainSc: 0-No pain      Patients Stated Pain Goal: 4 ([id number])  Complications: No apparent anesthesia complications

## 2019-07-16 NOTE — Op Note (Signed)
07/16/2019  11:29 AM  PATIENT:  Bobby Lane  79 y.o. male  PRE-OPERATIVE DIAGNOSIS:  Cervical myelopathy, Bilateral carpal tunnel syndrome, cervical stenosis, herniated cervical disc C 34, C 56, C 67 levels, left carpal tunnel syndrome  POST-OPERATIVE DIAGNOSIS:  Cervical myelopathy, Bilateral carpal tunnel syndrome, cervical stenosis, herniated cervical disc C 34, C 56, C 67 levels, left carpal tunnel syndrome   PROCEDURE:  Procedure(s) with comments: Cervical three-four Cervical five-six Cervical six-seven Anterior cervical decompression/discectomy/fusion (N/A) - 3C LEFT CARPAL TUNNEL RELEASE (Left) with PEEK cages, autograft, plate  SURGEON:  Surgeon(s) and Role:    Erline Levine, MD - Primary    * Newman Pies, MD - Assisting  PHYSICIAN ASSISTANT:   ASSISTANTS: [place], RN   Glenford Peers, NP   ANESTHESIA:   general  EBL:  40 mL   BLOOD ADMINISTERED:none  DRAINS: (#10) Jackson-Pratt drain(s) with closed bulb suction in the prevertebral space   LOCAL MEDICATIONS USED:  MARCAINE    and LIDOCAINE   SPECIMEN:  No Specimen  DISPOSITION OF SPECIMEN:  N/A  COUNTS:  YES  TOURNIQUET:  * No tourniquets in log *  DICTATION: Patient was brought to operating room and following the smooth and uncomplicated induction of general endotracheal anesthesia his head was placed on a horseshoe head holder he was placed in 5 pounds of Holter traction and his anterior neck was prepped and draped in usual sterile fashion. An incision was made on the left side of midline after infiltrating the skin and subcutaneous tissues with local lidocaine. The platysmal layer was incised and subplatysmal dissection was performed exposing the anterior border sternocleidomastoid muscle. Using blunt dissection the carotid sheath was kept lateral and trachea and esophagus kept medial exposing the anterior cervical spine. A bent spinal needle was placed it was felt to be the C 23 and C 34  levels and this was  confirmed on intraoperative x-ray. Subsequent exposure was performed at C 34, C 56, C 67 levels.  Longus coli muscles were taken down from the anterior cervical spine using electrocautery and key elevator and self-retaining retractor was placed. The interspace at C 34 was incised and a thorough discectomy was performed. Distraction pins were placed. Uncinate spurs and central spondylitic ridges were drilled down with a high-speed drill. The spinal cord dura and both C 4 nerve roots were widely decompressed. Hemostasis was assured. After trial sizing a 5 mm peek interbody cage was selected and packed with local autograft. The graft was tamped into position and countersunk appropriately. A 12 mm Aviator plate was placed at this level with 14 mm screws.  The retractor was moved and the interspace at C5-6 was incised and a thorough discectomy was performed. Distraction pins were placed. Uncinate spurs and central spondylitic ridges were drilled down with a high-speed drill. The spinal cord dura and both C 6 nerve roots were widely decompressed. Hemostasis was assured. After trial sizing a 5 mm peek interbody cage was selected and packed with local  autograft. The graft was tamped into position and countersunk appropriately.The interspace at C 67 was incised and a thorough discectomy was performed. Distraction pins were placed. Uncinate spurs and central spondylitic ridges were drilled down with a high-speed drill. The spinal cord dura and both C 7 nerve roots were widely decompressed. Hemostasis was assured. After trial sizing a 5 mm peek interbody cage was selected and packed with local autograft. The graft was tamped into position and countersunk appropriately.  Distraction weight was removed. A  28 mm Aviator anterior cervical plate was affixed to the cervical spine with 14 mm variable-angle screws 2 at C 5, 2 at C 6, 2 at C 7. All screws were well-positioned and locking mechanisms were engaged. Soft tissues were  inspected and found to be in good repair. The wound was irrigated. A final x-ray was obtained with good visualization at C 34 with the interbody graft well visualized. A # 10 JP drain was placed through a separate stab incision.  The platysma layer was closed with 3-0 Vicryl stitches and the skin was reapproximated with 3-0 Vicryl subcuticular stitches. The wound was dressed with Dermabond. Counts were correct at the end of the case.    His hand and left arm were prepped and draped with betadine scrub and paint and sterile stockinet and placed on armboard.  Left wrist was infiltrated with lidocaine and an incision was made over a length of 2 cm in line with the fourth ray.  The flexor retinaculum was incised and the carpal tunnel was decompressed.  Decompression was carried into the volar wrist and into the distal palm.  Hemostasis was assured. The wound was irrigated and closed with 3-0 nylon vertical mattress stitches and dressed with a sterile occlusive dressing with Adaptic, fluff gauze, Kerlix and cling wrap. Patient was taken to recovery in stable and satisfactory condition. Counts were correct at the end of the case.Patient was extubated and taken to recovery in stable and satisfactory condition.    PLAN OF CARE: Admit to inpatient   PATIENT DISPOSITION:  PACU - hemodynamically stable.   Delay start of Pharmacological VTE agent (>24hrs) due to surgical blood loss or risk of bleeding: yes

## 2019-07-16 NOTE — Care Management Obs Status (Signed)
[place] NOTIFICATION   Patient Details  Name: LAURITZ TUNGATE MRN: [id number] Date of Birth: [birth year]   Medicare Observation Status Notification Given:  Yes    Angelita Ingles, RN 07/16/2019, 1:45 PM

## 2019-07-16 NOTE — Brief Op Note (Signed)
07/16/2019  11:29 AM  PATIENT:  Bobby Lane  79 y.o. male  PRE-OPERATIVE DIAGNOSIS:  Cervical myelopathy, Bilateral carpal tunnel syndrome, cervical stenosis, herniated cervical disc C 34, C 56, C 67 levels, left carpal tunnel syndrome  POST-OPERATIVE DIAGNOSIS:  Cervical myelopathy, Bilateral carpal tunnel syndrome, cervical stenosis, herniated cervical disc C 34, C 56, C 67 levels, left carpal tunnel syndrome   PROCEDURE:  Procedure(s) with comments: Cervical three-four Cervical five-six Cervical six-seven Anterior cervical decompression/discectomy/fusion (N/A) - 3C LEFT CARPAL TUNNEL RELEASE (Left) with PEEK cages, autograft, plate  SURGEON:  Surgeon(s) and Role:    Erline Levine, MD - Primary    * Newman Pies, MD - Assisting  PHYSICIAN ASSISTANT:   ASSISTANTS: Poteat, RN   Glenford Peers, NP   ANESTHESIA:   general  EBL:  40 mL   BLOOD ADMINISTERED:none  DRAINS: (#10) Jackson-Pratt drain(s) with closed bulb suction in the prevertebral space   LOCAL MEDICATIONS USED:  MARCAINE    and LIDOCAINE   SPECIMEN:  No Specimen  DISPOSITION OF SPECIMEN:  N/A  COUNTS:  YES  TOURNIQUET:  * No tourniquets in log *  DICTATION: Patient was brought to operating room and following the smooth and uncomplicated induction of general endotracheal anesthesia his head was placed on a horseshoe head holder he was placed in 5 pounds of Holter traction and his anterior neck was prepped and draped in usual sterile fashion. An incision was made on the left side of midline after infiltrating the skin and subcutaneous tissues with local lidocaine. The platysmal layer was incised and subplatysmal dissection was performed exposing the anterior border sternocleidomastoid muscle. Using blunt dissection the carotid sheath was kept lateral and trachea and esophagus kept medial exposing the anterior cervical spine. A bent spinal needle was placed it was felt to be the C 23 and C 34  levels and this was  confirmed on intraoperative x-ray. Subsequent exposure was performed at C 34, C 56, C 67 levels.  Longus coli muscles were taken down from the anterior cervical spine using electrocautery and key elevator and self-retaining retractor was placed. The interspace at C 34 was incised and a thorough discectomy was performed. Distraction pins were placed. Uncinate spurs and central spondylitic ridges were drilled down with a high-speed drill. The spinal cord dura and both C 4 nerve roots were widely decompressed. Hemostasis was assured. After trial sizing a 5 mm peek interbody cage was selected and packed with local autograft. The graft was tamped into position and countersunk appropriately. A 12 mm Aviator plate was placed at this level with 14 mm screws.  The retractor was moved and the interspace at C5-6 was incised and a thorough discectomy was performed. Distraction pins were placed. Uncinate spurs and central spondylitic ridges were drilled down with a high-speed drill. The spinal cord dura and both C 6 nerve roots were widely decompressed. Hemostasis was assured. After trial sizing a 5 mm peek interbody cage was selected and packed with local  autograft. The graft was tamped into position and countersunk appropriately.The interspace at C 67 was incised and a thorough discectomy was performed. Distraction pins were placed. Uncinate spurs and central spondylitic ridges were drilled down with a high-speed drill. The spinal cord dura and both C 7 nerve roots were widely decompressed. Hemostasis was assured. After trial sizing a 5 mm peek interbody cage was selected and packed with local autograft. The graft was tamped into position and countersunk appropriately.  Distraction weight was removed. A  28 mm Aviator anterior cervical plate was affixed to the cervical spine with 14 mm variable-angle screws 2 at C 5, 2 at C 6, 2 at C 7. All screws were well-positioned and locking mechanisms were engaged. Soft tissues were  inspected and found to be in good repair. The wound was irrigated. A final x-ray was obtained with good visualization at C 34 with the interbody graft well visualized. A # 10 JP drain was placed through a separate stab incision.  The platysma layer was closed with 3-0 Vicryl stitches and the skin was reapproximated with 3-0 Vicryl subcuticular stitches. The wound was dressed with Dermabond. Counts were correct at the end of the case.    His hand and left arm were prepped and draped with betadine scrub and paint and sterile stockinet and placed on armboard.  Left wrist was infiltrated with lidocaine and an incision was made over a length of 2 cm in line with the fourth ray.  The flexor retinaculum was incised and the carpal tunnel was decompressed.  Decompression was carried into the volar wrist and into the distal palm.  Hemostasis was assured. The wound was irrigated and closed with 3-0 nylon vertical mattress stitches and dressed with a sterile occlusive dressing with Adaptic, fluff gauze, Kerlix and cling wrap. Patient was taken to recovery in stable and satisfactory condition. Counts were correct at the end of the case.Patient was extubated and taken to recovery in stable and satisfactory condition.    PLAN OF CARE: Admit to inpatient   PATIENT DISPOSITION:  PACU - hemodynamically stable.   Delay start of Pharmacological VTE agent (>24hrs) due to surgical blood loss or risk of bleeding: yes

## 2019-07-16 NOTE — Anesthesia Postprocedure Evaluation (Signed)
Anesthesia Post Note  Patient: TRAMINE HIGGS  Procedure(s) Performed: Cervical three-four Cervical five-six Cervical six-seven Anterior cervical decompression/discectomy/fusion (N/A ) LEFT CARPAL TUNNEL RELEASE (Left )     Patient location during evaluation: PACU Anesthesia Type: General Level of consciousness: sedated Pain management: pain level controlled Vital Signs Assessment: post-procedure vital signs reviewed and stable Respiratory status: spontaneous breathing and respiratory function stable Cardiovascular status: stable Postop Assessment: no apparent nausea or vomiting Anesthetic complications: no    Last Vitals:  Vitals:   07/16/19 1250 07/16/19 1327  BP: (!) 151/75 (!) 166/95  Pulse: 79 80  Resp: 12 16  Temp: 36.5 C 36.5 C  SpO2: 97% 97%    Last Pain:  Vitals:   07/16/19 1327  TempSrc: Oral  PainSc:                  Mical Brun DANIEL

## 2019-07-16 NOTE — Interval H&P Note (Signed)
History and Physical Interval Note:  07/16/2019 7:30 AM  Bobby Lane  has presented today for surgery, with the diagnosis of Cervical myelopathy, Bilateral carpal tunnel syndrome.  The various methods of treatment have been discussed with the patient and family. After consideration of risks, benefits and other options for treatment, the patient has consented to  Procedure(s) with comments: Cervical 3-4 Cervical 5-6 Cervical 6-7 Anterior cervical decompression/discectomy/fusion (N/A) - 3C LEFT CARPAL TUNNEL RELEASE (Left) as a surgical intervention.  The patient's history has been reviewed, patient examined, no change in status, stable for surgery.  I have reviewed the patient's chart and labs.  Questions were answered to the patient's satisfaction.     Peggyann Shoals

## 2019-07-16 NOTE — Progress Notes (Signed)
Awake, alert, conversant.  MAEW with good strength.  Doing well. 

## 2019-07-16 NOTE — Progress Notes (Addendum)
Patient did not quarantine after he had his COVID test.  Patient went to [place].     Patient stated that he has had both shots of COVID vaccine last dose was 07/04/19  6:43 AM Dr Tobias Alexander aware - plan to proceed

## 2019-07-16 NOTE — H&P (Signed)
Patient ID:   [id number] Patient: Bobby Lane  Date of Birth: 05-27-1940 Visit Type: Office Visit   Date: 05/27/2019 09:45 AM Provider: Marchia Meiers. Vertell Limber MD   This 79 year old male presents for neck pain.  HISTORY OF PRESENT ILLNESS: 1.  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  The C3-4 level is also severely affected. his nerve conduction testing demonstrates very severe bilateral carpal tunnel syndrome with profound sensory motor lower loss with severe demyelination in both hands.    I have recommended surgery to the patient.  This will consist of anterior cervical decompression and fusion at the C3-4, C5-6 and C6-7 levels with bilateral carpal tunnel release.  The right side will be done at the time of his neck surgery and the left side will be staged later.    The patient needs cardiology clearance for surgery and this will be obtained.      Medical/Surgical/Interim History Reviewed, no change.     PAST MEDICAL HISTORY, SURGICAL HISTORY, FAMILY HISTORY, SOCIAL HISTORY AND REVIEW OF SYSTEMS I have reviewed the patient's past medical, surgical, family and social history as well as the comprehensive review of systems as included on the [facility] history form dated 04/21/2019, which I have signed.  Family History: Reviewed, no changes.    Social History: Reviewed, no changes.   MEDICATIONS: (added, continued or stopped this visit) Started Medication Directions Instruction Stopped  acetaminophen 325 mg tablet     allopurinol 300 mg tablet take 1 tablet by oral route  every day    duloxetine 30 mg capsule,delayed release     Gas-X Extra Strength     losartan 100 mg tablet     metoprolol succinate ER 100 mg tablet,extended release 24 hr     spironolactone 25  mg tablet     Synthroid 125 mcg tablet take 1 tablet by oral route  every day    tamsulosin 0.4 mg capsule     Vitamin D3 125 mcg (5,000 unit) tablet       ALLERGIES: Ingredient Reaction Medication Name Comment SULFA (SULFONAMIDE ANTIBIOTICS)    CODEINE    OXYCODONE HCL  PERCOCET  PROCAINE HCL  Novocain  TRAMADOL    ACETAMINOPHEN  PERCOCET  MORPHINE        PHYSICAL EXAM:  Vitals Date Temp F BP Pulse Ht In Wt Lb BMI BSA Pain Score 05/27/2019  158/76 64 69    5/10     IMPRESSION:    Severe cord compression with bilateral foraminal stenosis and bilateral carpal tunnel syndrome. the most affected levels in his neck include C3-4,  C5-6, C6-7 levels.  PLAN:   Three level anterior cervical decompression and fusion with staged bilateral carpal tunnel release.  The 1st treated will be the right side which will be done at the time of his neck surgery  Orders: Diagnostic Procedures: Assessment Procedure M54.12 Cervical Spine- Lateral Instruction(s)/Education: Assessment Instruction I10 Hypertension education Miscellaneous: Assessment  M48.02 Hard Cervical Collar  Completed Orders (this encounter) Order Details Reason Side Interpretation Result Initial Treatment Date Region Hypertension education Follow-up with primary physician        Assessment/Plan  # Detail Type Description  1. Assessment Bilateral carpal tunnel syndrome (G56.03).     2. Assessment Spinal stenosis, cervical region (M48.02).  Plan Orders Cardiology  Referral to [facility] for Surgery. Clinical information/comments: New pt appointment for Surgical clearance. Hard Cervical Collar.     3. Assessment Radiculopathy, cervical region (M54.12).     4. Assessment Cervicalgia (M54.2).     5. Assessment Cervical myelopathy (G95.9).     6. Assessment Essential (primary) hypertension  (I10).       Pain Management Plan Pain Scale: 5/10. Method: Numeric Pain Intensity Scale.              Provider:  Marchia Meiers. Vertell Limber MD  06/07/2019 05:22 PM    Dictation edited by: Marchia Meiers. Vertell Limber    CC Providers: Merrie Roof [facility] [address] [place],  [place]  [address]-   Lorelee Market  [facility] [address] [place], [place] [address]-               Electronically signed by Marchia Meiers. Vertell Limber MD on 06/07/2019 05:22 PM

## 2019-07-16 NOTE — Care Management CC44 (Signed)
Condition Code 44 Documentation Completed  Patient Details  Name: Bobby Lane MRN: [id number] Date of Birth: 16-Oct-1940   Condition Code 44 given:  Yes Patient signature on Condition Code 44 notice:  Yes Documentation of 2 MD's agreement:  Yes Code 44 added to claim:  Yes    Angelita Ingles, RN 07/16/2019, 1:46 PM

## 2019-07-16 NOTE — Anesthesia Procedure Notes (Signed)
Procedure Name: Intubation Date/Time: 07/16/2019 7:39 AM Performed by: Mariea Clonts, CRNA Pre-anesthesia Checklist: Patient identified, Emergency Drugs available, Suction available and Patient being monitored Patient Re-evaluated:Patient Re-evaluated prior to induction Oxygen Delivery Method: Circle System Utilized Preoxygenation: Pre-oxygenation with 100% oxygen Induction Type: IV induction Ventilation: Mask ventilation without difficulty and Oral airway inserted - appropriate to patient size Laryngoscope Size: Glidescope and 4 Grade View: Grade I Tube type: Oral Tube size: 7.5 mm Number of attempts: 1 Airway Equipment and Method: Stylet and Oral airway Placement Confirmation: ETT inserted through vocal cords under direct vision,  positive ETCO2 and breath sounds checked- equal and bilateral Tube secured with: Tape Dental Injury: Teeth and Oropharynx as per pre-operative assessment

## 2019-07-16 NOTE — Evaluation (Signed)
Physical Therapy Evaluation Patient Details Name: Bobby Lane MRN: [id number] DOB: April 22, 1940 Today's Date: 07/16/2019   History of Present Illness  Patient is a 79 yo male admitted with severe cord compression with bilateral foraminal stenosis and bilateral carpal tunnel syndrome. the most affected levels in his neck include C3-4,  C5-6, C6-7 levels.  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  He is now s/p ACDF C3-4, C5-6, and C6-7 and R carpal tunnel release with L planned for later.  Clinical Impression  Patient presents with decreased mobility due to decreased LE strength, decreased balance, decreased activity tolerance and decreased UE use following above procedures.  He was ambulatory prior to admission with two canes for short distances and used motorized scooter for longer distances.  He denies recent falls, but was able to assist his wife with meal prep and grocery shopping.  He did not state other available assistance for home and is agreeable to short term SNF at [facility].  PT to follow acutely.     Follow Up Recommendations SNF    Equipment Recommendations  Other (comment)(possibly bilateral platforms for his RW)    Recommendations for Other Services       Precautions / Restrictions Precautions Precautions: Cervical Required Braces or Orthoses: Cervical Brace Cervical Brace: Soft collar Restrictions Weight Bearing Restrictions: No      Mobility  Bed Mobility Overal bed mobility: Needs Assistance Bed Mobility: Rolling;Sidelying to Sit;Sit to Sidelying Rolling: Min guard Sidelying to sit: Min assist;HOB elevated     Sit to sidelying: Mod assist General bed mobility comments: cues for technique, assist for trunk upright, for lowering trunk and lifting legs to sidelying  Transfers Overall transfer level: Needs assistance Equipment used: Left  platform walker Transfers: Sit to/from Stand Sit to Stand: From elevated surface;Min assist         General transfer comment: pt pulls up on RW  Ambulation/Gait Ambulation/Gait assistance: Min assist Gait Distance (Feet): 150 Feet Assistive device: Left platform walker Gait Pattern/deviations: Step-through pattern;Decreased stride length;Trunk flexed     General Gait Details: heavy UE support on walker, c/o L hand numbness and shoulder pain, reports usually wears built up insert in R shoe  Stairs            Wheelchair Mobility    Modified Rankin (Stroke Patients Only)       Balance Overall balance assessment: Needs assistance   Sitting balance-Leahy Scale: Fair     Standing balance support: Bilateral upper extremity supported Standing balance-Leahy Scale: Poor Standing balance comment: UE support in standing versus braced back of legs against bed                             Pertinent Vitals/Pain Pain Assessment: Faces Faces Pain Scale: Hurts even more Pain Location: R shoulder, R hand with weight bearing with walking and neck/throat Pain Descriptors / Indicators: Aching;Tender Pain Intervention(s): Monitored during session    Home Living Family/patient expects to be discharged to:: Private residence Living Arrangements: Spouse/significant other Available Help at Discharge: Family Type of Home: House Home Access: Stairs to enter Entrance Stairs-Rails: Right Entrance Stairs-Number of Steps: 3 Home Layout: One level Home Equipment: Environmental consultant - 2 wheels;Cane - single point;Electric scooter;Toilet riser Additional Comments: lift chair, rope to use to step into tub    Prior Function Level of Independence:  Independent with assistive device(s)         Comments: ambulated with two canes, denies recent falls     Hand Dominance        Extremity/Trunk Assessment   Upper Extremity Assessment Upper Extremity Assessment: Defer to OT evaluation     Lower Extremity Assessment Lower Extremity Assessment: RLE deficits/detail RLE Deficits / Details: painful with knee flexion strength at least 3/5    Cervical / Trunk Assessment Cervical / Trunk Assessment: Other exceptions;Kyphotic Cervical / Trunk Exceptions: cervical surgery  Communication   Communication: HOH  Cognition Arousal/Alertness: Awake/alert Behavior During Therapy: WFL for tasks assessed/performed Overall Cognitive Status: Within Functional Limits for tasks assessed                                 General Comments: not formally assessed      General Comments General comments (skin integrity, edema, etc.): L hand wrapped, discussed d/c plans, pt open to rehab at [facility]     Assessment/Plan    PT Assessment Patient needs continued PT services  PT Problem List Decreased mobility;Decreased knowledge of use of DME;Impaired sensation;Decreased balance;Decreased knowledge of precautions;Decreased activity tolerance;Decreased safety awareness;Decreased strength       PT Treatment Interventions DME instruction;Therapeutic activities;Gait training;Therapeutic exercise;Patient/family education;Balance training;Functional mobility training;Stair training    PT Goals (Current goals can be found in the Care Plan section)  Acute Rehab PT Goals Patient Stated Goal: Agreeable to short term rehab if needed PT Goal Formulation: With patient Time For Goal Achievement: 07/30/19 Potential to Achieve Goals: Good    Frequency Min 5X/week   Barriers to discharge        Co-evaluation               AM-PAC PT "6 Clicks" Mobility  Outcome Measure Help needed turning from your back to your side while in a flat bed without using bedrails?: A Little Help needed moving from lying on your back to sitting on the side of a flat bed without using bedrails?: A Little Help needed moving to and from a bed to a chair (including a wheelchair)?: A  Little Help needed standing up from a chair using your arms (e.g., wheelchair or bedside chair)?: A Little Help needed to walk in hospital room?: A Little Help needed climbing 3-5 steps with a railing? : A Lot 6 Click Score: 17    End of Session Equipment Utilized During Treatment: Cervical collar Activity Tolerance: Patient tolerated treatment well Patient left: in bed;with call bell/phone within reach Nurse Communication: Mobility status PT Visit Diagnosis: Other abnormalities of gait and mobility (R26.89);Muscle weakness (generalized) (M62.81)    Time: [id number] PT Time Calculation (min) (ACUTE ONLY): 37 min   Charges:   PT Evaluation $PT Eval Moderate Complexity: 1 Mod PT Treatments $Gait Training: 8-22 mins        Magda Kiel, PT Acute Rehabilitation Services [phone] 07/16/2019   Reginia Naas 07/16/2019, 5:18 PM

## 2019-07-16 NOTE — Progress Notes (Signed)
Orthopedic Tech Progress Note Patient Details:  BINH MENCIAS [birth year] [id number]  Ortho Devices Type of Ortho Device: Soft collar Ortho Device/Splint Location: Neck Ortho Device/Splint Interventions: Ordered, Application   Post Interventions Patient Tolerated: Well Instructions Provided: Adjustment of device, Care of device, Poper ambulation with device   Jameek Bruntz P Lorel Monaco 07/16/2019, 1:37 PM

## 2019-07-17 DIAGNOSIS — I10 Essential (primary) hypertension: Secondary | ICD-10-CM | POA: Diagnosis present

## 2019-07-17 DIAGNOSIS — Z6831 Body mass index (BMI) 31.0-31.9, adult: Secondary | ICD-10-CM | POA: Diagnosis not present

## 2019-07-17 DIAGNOSIS — Z9841 Cataract extraction status, right eye: Secondary | ICD-10-CM | POA: Diagnosis not present

## 2019-07-17 DIAGNOSIS — M4802 Spinal stenosis, cervical region: Secondary | ICD-10-CM | POA: Diagnosis present

## 2019-07-17 DIAGNOSIS — Z961 Presence of intraocular lens: Secondary | ICD-10-CM | POA: Diagnosis present

## 2019-07-17 DIAGNOSIS — M109 Gout, unspecified: Secondary | ICD-10-CM | POA: Diagnosis present

## 2019-07-17 DIAGNOSIS — Z87442 Personal history of urinary calculi: Secondary | ICD-10-CM | POA: Diagnosis not present

## 2019-07-17 DIAGNOSIS — M50023 Cervical disc disorder at C6-C7 level with myelopathy: Secondary | ICD-10-CM | POA: Diagnosis present

## 2019-07-17 DIAGNOSIS — Z882 Allergy status to sulfonamides status: Secondary | ICD-10-CM | POA: Diagnosis not present

## 2019-07-17 DIAGNOSIS — G5603 Carpal tunnel syndrome, bilateral upper limbs: Secondary | ICD-10-CM | POA: Diagnosis present

## 2019-07-17 DIAGNOSIS — Z85828 Personal history of other malignant neoplasm of skin: Secondary | ICD-10-CM | POA: Diagnosis not present

## 2019-07-17 DIAGNOSIS — H9190 Unspecified hearing loss, unspecified ear: Secondary | ICD-10-CM | POA: Diagnosis present

## 2019-07-17 DIAGNOSIS — J449 Chronic obstructive pulmonary disease, unspecified: Secondary | ICD-10-CM | POA: Diagnosis present

## 2019-07-17 DIAGNOSIS — I2510 Atherosclerotic heart disease of native coronary artery without angina pectoris: Secondary | ICD-10-CM | POA: Diagnosis present

## 2019-07-17 DIAGNOSIS — K219 Gastro-esophageal reflux disease without esophagitis: Secondary | ICD-10-CM | POA: Diagnosis present

## 2019-07-17 DIAGNOSIS — E039 Hypothyroidism, unspecified: Secondary | ICD-10-CM | POA: Diagnosis present

## 2019-07-17 DIAGNOSIS — Z79899 Other long term (current) drug therapy: Secondary | ICD-10-CM | POA: Diagnosis not present

## 2019-07-17 DIAGNOSIS — Z20822 Contact with and (suspected) exposure to covid-19: Secondary | ICD-10-CM | POA: Diagnosis present

## 2019-07-17 DIAGNOSIS — Z885 Allergy status to narcotic agent status: Secondary | ICD-10-CM | POA: Diagnosis not present

## 2019-07-17 DIAGNOSIS — Z9842 Cataract extraction status, left eye: Secondary | ICD-10-CM | POA: Diagnosis not present

## 2019-07-17 DIAGNOSIS — M5412 Radiculopathy, cervical region: Secondary | ICD-10-CM | POA: Diagnosis present

## 2019-07-17 DIAGNOSIS — Z79890 Hormone replacement therapy (postmenopausal): Secondary | ICD-10-CM | POA: Diagnosis not present

## 2019-07-17 DIAGNOSIS — E669 Obesity, unspecified: Secondary | ICD-10-CM | POA: Diagnosis present

## 2019-07-17 DIAGNOSIS — N3941 Urge incontinence: Secondary | ICD-10-CM | POA: Diagnosis present

## 2019-07-17 NOTE — Progress Notes (Addendum)
Subjective: Patient reports "doing well." No acute events over night. Has been up once with the platform walker ambulating down the hall. He reports being the sole caregiver at his home for his blind ex-wife and would like to have rehab at [facility] for one week prior to going home and resuming care of his ex-wife.   Objective: Vital signs in last 24 hours: Temp:  [97.3 F (36.3 C)-98.2 F (36.8 C)] 98.2 F (36.8 C) (06/04 0441) Pulse Rate:  [74-84] 77 (06/04 0441) Resp:  [7-19] 16 (06/04 0441) BP: (126-166)/(64-95) 126/71 (06/04 0441) SpO2:  [94 %-100 %] 94 % (06/04 0441)  Intake/Output from previous day: 06/03 0701 - 06/04 0700 In: 1830 [P.O.:580; I.V.:1100; IV Piggyback:100] Out: 1735 [Urine:1625; Drains:70; Blood:40] Intake/Output this shift: No intake/output data recorded.  Physical Exam: Awake, alert, conversant. MAEW with good strength. Left hand numbness and tingling has resolved. Wrist and neck dressings CDI. Drain put out about 30 ml overnight.   Lab Results: Recent Labs    07/14/19 1353  WBC 7.5  HGB 13.3  HCT 40.8  PLT 210   BMET Recent Labs    07/14/19 1353  NA 139  K 4.8  CL 103  CO2 25  GLUCOSE 90  BUN 28*  CREATININE 1.56*  CALCIUM 9.7    Studies/Results: DG Cervical Spine 2-3 Views  Result Date: 07/16/2019 CLINICAL DATA:  Cervical ACDF. EXAM: CERVICAL SPINE - 2-3 VIEW COMPARISON:  MRI cervical spine dated May 27, 2019. FINDINGS: Image 1 demonstrates needle localizers at the C2-C3 and C3-C4 disc spaces. Image 2 demonstrates needle localizers at the C3-C4 and C5-C6 disc spaces. The final image demonstrates interval C3-C4 and C5-C7 ACDF. IMPRESSION: Intraoperative x-ray guidance for cervical ACDF. Electronically Signed   By: Titus Dubin M.D.   On: 07/16/2019 13:57    Assessment/Plan: Continue cervical collar. Continue ambulating and working with therapies. Will leave drain in for today. Work towards discharging patient to [facility].  Will consult case management and social work.   LOS: 1 day    Peggyann Shoals, MD 07/17/2019, 8:22 AM

## 2019-07-17 NOTE — Progress Notes (Signed)
Occupational Therapy Evaluation Patient Details Name: Bobby Lane MRN: [id number] DOB: October 30, 1940 Today's Date: 07/17/2019    History of Present Illness Patient is a 79 yo male admitted with severe cord compression with bilateral foraminal stenosis and bilateral carpal tunnel syndrome. the most affected levels in his neck include C3-4,  C5-6, C6-7 levels.  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  He is now s/p ACDF C3-4, C5-6, and C6-7 and L carpal tunnel release with R planned for later.   Clinical Impression   Prior to hospitalization, pt was Mod I with all ADLs/IADLs including driving and taking care of his ex-wife who is blind. Pt reports utilizing a rollator for medium distances, cane for short distances, and motorized scooter for long distances. Pt reports having a son who stops by to assist with ADLs/IADLs as needed. Pt admitted for above and limited by decreased sensation, decreased strength, decreased safety awareness, decreased activity tolerance, and decreased balance. Today, pt received sitting up in recliner, agreeable to OT eval. Pt's soft collar donned upon arrival. Pt did not c/o pain, however pt c/o mild tingling/numbness in R hand (L has stopped since carpal tunnel release). Pt demonstrated poor safety awareness, attempting to utilize cane instead of bilateral platform walker and moving without gait belt donned or therapist close by. Pt requires min guard-min assist for functional mobility/transfers using bilateral platform walker for support. Pt requires mod assist for toileting tasks, including perineal and perianal care in sitting/standing positions. Educated pt on proper use of grab bars, bilateral platform walker, BSC over toilet. Pt would benefit from continued skilled OT services at a SNF. Will continue to follow acutely as able.      Follow Up Recommendations   SNF;Supervision/Assistance - 24 hour    Equipment Recommendations  (defer to SNF)    Recommendations for Other Services       Precautions / Restrictions Precautions Precautions: Cervical Required Braces or Orthoses: Cervical Brace Cervical Brace: Soft collar Restrictions Weight Bearing Restrictions: No      Mobility Bed Mobility Overal bed mobility: (received up in recliner)                Transfers Overall transfer level: Needs assistance Equipment used: Bilateral platform walker Transfers: Sit to/from Stand Sit to Stand: Min guard;Min assist         General transfer comment: pt demonstrated poor safety awareness; attempted to utilize cane instead of bilateral platform walker; no LOB noted    Balance Overall balance assessment: Needs assistance Sitting-balance support: No upper extremity supported;Feet supported Sitting balance-Leahy Scale: Good     Standing balance support: Bilateral upper extremity supported;During functional activity Standing balance-Leahy Scale: Poor Standing balance comment: no LOB; however unsteady; required min guard-min assist for dynamic standing balance and ambulation to/from bathroom                           ADL either performed or assessed with clinical judgement   ADL Overall ADL's : Needs assistance/impaired Eating/Feeding: Independent;Sitting   Grooming: Wash/dry hands;Wash/dry face;Min guard;Standing Grooming Details (indicate cue type and reason): stood at sink with min guard for balance to wash hands; setup to wash face while sitting on toilet Upper Body Bathing: Moderate assistance;Sitting   Lower Body Bathing: Moderate assistance;Sitting/lateral leans;Sit to/from stand   Upper Body Dressing : Moderate assistance;Sitting   Lower Body Dressing:  Maximal assistance;Sitting/lateral leans;Sit to/from stand   Toilet Transfer: Min guard;Cueing for safety;Transfer board;Comfort height toilet;Grab bars;RW    Writer and Hygiene: Moderate assistance;Sitting/lateral lean;Sit to/from stand   Tub/ Banker: (N/A on this date )   Functional mobility during ADLs: Minimal assistance;Cueing for safety;Rolling walker General ADL Comments: pt requires min assist for functional mobility using platform RW for support; pt requires min-max assist for BADLs secondary to cervical precautions, decreased balance, and recent LUE carpal tunnel release      Vision Baseline Vision/History: Wears glasses Wears Glasses: At all times Patient Visual Report: No change from baseline       Perception     Praxis      Pertinent Vitals/Pain Pain Assessment: No/denies pain     Hand Dominance Right   Extremity/Trunk Assessment Upper Extremity Assessment Upper Extremity Assessment: Overall WFL for tasks assessed;RUE deficits/detail;LUE deficits/detail RUE Deficits / Details: c/o tingling/numbess in R hand digits secondary to CTS RUE Sensation: WNL(c/o moderate tingling/numbness) RUE Coordination: WNL LUE: Unable to fully assess due to immobilization LUE Sensation: (s/p recent CTS release in L hand) LUE Coordination: (unable to fully assess secondary to bandages/recent surgery)   Lower Extremity Assessment Lower Extremity Assessment: Defer to PT evaluation   Cervical / Trunk Assessment Cervical / Trunk Assessment: Other exceptions;Kyphotic Cervical / Trunk Exceptions: cervical surgery   Communication Communication Communication: HOH   Cognition Arousal/Alertness: Awake/alert Behavior During Therapy: WFL for tasks assessed/performed Overall Cognitive Status: Within Functional Limits for tasks assessed                                 General Comments: pt impulsive; unsafe using platform walker; stood before gait belt applied   General Comments  pt c/o itchiness under soft collar; therapist checked skin and recommended washing/drying and applying lotion to  prevent dry skin    Exercises     Shoulder Instructions      Home Living Family/patient expects to be discharged to:: Private residence Living Arrangements: Spouse/significant other Available Help at Discharge: Family Type of Home: House Home Access: Stairs to enter Technical brewer of Steps: 4 Entrance Stairs-Rails: None Home Layout: One level     Bathroom Shower/Tub: Tub/shower unit         Home Equipment: Environmental consultant - 2 wheels;Walker - 4 wheels;Cane - single point;Toilet riser   Additional Comments: lift chair, rope to use to step into tub      Prior Functioning/Environment Level of Independence: Independent with assistive device(s)        Comments: ambulated with two canes, denies recent falls        OT Problem List: Decreased strength;Decreased activity tolerance;Impaired balance (sitting and/or standing);Decreased coordination;Decreased safety awareness;Decreased knowledge of use of DME or AE      OT Treatment/Interventions: Self-care/ADL training;Therapeutic exercise;Energy conservation;DME and/or AE instruction;Therapeutic activities;Patient/family education;Balance training    OT Goals(Current goals can be found in the care plan section) Acute Rehab OT Goals Patient Stated Goal: Agreeable to short term rehab if needed  OT Frequency: Min 2X/week   Barriers to D/C:            Co-evaluation              AM-PAC OT "6 Clicks" Daily Activity     Outcome Measure Help from another person eating meals?: None Help from another person taking care of personal grooming?: A Little Help from another person toileting, which includes using  toliet, bedpan, or urinal?: A Lot Help from another person bathing (including washing, rinsing, drying)?: A Lot Help from another person to put on and taking off regular upper body clothing?: A Lot Help from another person to put on and taking off regular lower body clothing?: A Lot 6 Click Score: 15   End of Session  Equipment Utilized During Treatment: Gait belt;Other (comment);Cervical collar(bilateral platform walker) Nurse Communication: Mobility status  Activity Tolerance: Patient tolerated treatment well Patient left: in bed;with call bell/phone within reach;with chair alarm set  OT Visit Diagnosis: Unsteadiness on feet (R26.81);Repeated falls (R29.6);Muscle weakness (generalized) (M62.81)                Time: 6060-0459 OT Time Calculation (min): 36 min Charges:  OT General Charges $OT Visit: 1 Visit OT Evaluation $OT Eval Moderate Complexity: 1 Mod OT Treatments $Self Care/Home Management : 8-22 mins  Michel Bickers, OTR/L [facility] [phone]   Francesca Jewett 07/17/2019, 5:55 PM

## 2019-07-17 NOTE — Progress Notes (Signed)
Physical Therapy Treatment Patient Details Name: Bobby Lane MRN: [id number] DOB: 05/24/1940 Today's Date: 07/17/2019    History of Present Illness Patient is a 79 yo male admitted with severe cord compression with bilateral foraminal stenosis and bilateral carpal tunnel syndrome. the most affected levels in his neck include C3-4,  C5-6, C6-7 levels.  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  He is now s/p ACDF C3-4, C5-6, and C6-7 and L carpal tunnel release with R planned for later.    PT Comments    Pt OOB in bathroom with NT upon arrival of PT, agreeable to PT session with focus on progressing functional ambulation and safety with mobility. The pt was able to demonstrate improved technique and stability with sit-stand transfers, needing only miG for safety to complete the transfers. The pt continues to present with deficits in gait that result in significantly increased reliance on BPFRW, slowed gait, and significant trunk flexion with ambulation. 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     Follow Up Recommendations  SNF     Equipment Recommendations  Other (comment)(bilateral platform RW)    Recommendations for Other Services       Precautions / Restrictions Precautions Precautions: Cervical Precaution Booklet Issued: Yes (comment) Required Braces or Orthoses: Cervical Brace Cervical Brace: Soft collar Restrictions Weight Bearing Restrictions: No    Mobility  Bed Mobility Overal bed mobility: Needs Assistance             General bed mobility comments: pt OOB in bathroom upon arrival, returned to  recliner  Transfers Overall transfer level: Needs assistance Equipment used: Bilateral platform walker Transfers: Sit to/from Stand Sit to Stand: Min guard         General transfer comment: Pt able to push from armrest of BSC with RUE without holding RW. no assist. Pt verbalizing that he prefers elevated surfaces, but was able to lower to recliner with good eccentric lower.  Ambulation/Gait Ambulation/Gait assistance: Min guard Gait Distance (Feet): 200 Feet Assistive device: Bilateral platform walker Gait Pattern/deviations: Step-through pattern;Decreased stride length;Trunk flexed;Decreased stance time - right Gait velocity: 0.4 m/s Gait velocity interpretation: 1.31 - 2.62 ft/sec, indicative of limited community ambulator General Gait Details: heavy UE support on walker, c/o R hand numbness. pt with increased trunk flexion and distance from RW despite cues, pt reports "this is just how I have to walk"   Stairs             Wheelchair Mobility    Modified Rankin (Stroke Patients Only)       Balance Overall balance assessment: Needs assistance Sitting-balance support: No upper extremity supported Sitting balance-Leahy Scale: Good     Standing balance support: Bilateral upper extremity supported Standing balance-Leahy Scale: Poor Standing balance comment: UE support in standing versus braced back of legs against bed                            Cognition Arousal/Alertness: Awake/alert Behavior During Therapy: WFL for tasks assessed/performed Overall Cognitive Status: Within Functional Limits for tasks assessed  General Comments: not formally assessed, pt agreeable to PT session, but unable to follow cues for improved safety with ambulation/RW useage      Exercises      General Comments General comments (skin integrity, edema, etc.): L hand wrapped, VSS on RA      Pertinent Vitals/Pain Pain  Assessment: Faces Faces Pain Scale: Hurts a little bit Pain Location: R fingers (numb/tingling) Pain Descriptors / Indicators: Pins and needles;Tingling Pain Intervention(s): Monitored during session    Home Living                      Prior Function            PT Goals (current goals can now be found in the care plan section) Acute Rehab PT Goals Patient Stated Goal: Agreeable to short term rehab if needed PT Goal Formulation: With patient Time For Goal Achievement: 07/30/19 Potential to Achieve Goals: Good Progress towards PT goals: Progressing toward goals    Frequency    Min 5X/week      PT Plan Current plan remains appropriate    Co-evaluation              AM-PAC PT "6 Clicks" Mobility   Outcome Measure  Help needed turning from your back to your side while in a flat bed without using bedrails?: A Little Help needed moving from lying on your back to sitting on the side of a flat bed without using bedrails?: A Little Help needed moving to and from a bed to a chair (including a wheelchair)?: A Little Help needed standing up from a chair using your arms (e.g., wheelchair or bedside chair)?: A Little Help needed to walk in hospital room?: A Little Help needed climbing 3-5 steps with a railing? : A Lot 6 Click Score: 17    End of Session Equipment Utilized During Treatment: Cervical collar;Gait belt Activity Tolerance: Patient tolerated treatment well Patient left: in bed;with call bell/phone within reach Nurse Communication: Mobility status PT Visit Diagnosis: Other abnormalities of gait and mobility (R26.89);Muscle weakness (generalized) (M62.81)     Time: 8338-2505 PT Time Calculation (min) (ACUTE ONLY): 28 min  Charges:  $Gait Training: 23-37 mins                     Karma Ganja, PT, DPT   Acute Rehabilitation Department Pager #: [phone]   Otho Bellows 07/17/2019, 10:31 AM

## 2019-07-17 NOTE — NC FL2 (Signed)
[place] MEDICAID FL2 LEVEL OF CARE SCREENING TOOL     IDENTIFICATION  Patient Name: Bobby Lane Birthdate: 1940-04-26 Sex: male Admission Date (Current Location): 07/16/2019  [facility] and [place] Number:  Herbalist and Address:  [facility] [place], [place] [address], [place], [place] [address]      Provider Number: [id number]  Attending Physician Name and Address:  Erline Levine, MD  Relative Name and Phone Number:  Desmon Hitchner [phone]    Current Level of Care: Hospital Recommended Level of Care: [place] Prior Approval Number:    Date Approved/Denied:   PASRR Number: [id number]  Discharge Plan: SNF    Current Diagnoses: Patient Active Problem List   Diagnosis Date Noted  . Cervical myelopathy ([place]) 07/16/2019  . Osteoarthritis 02/27/2019  . 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 History of urinary retention 01/06/2019  . Pressure injury of skin 11/20/2018  . Sepsis ([place]) 11/19/2018  . Pain due to onychomycosis of toenails of both feet 08/18/2018  . Fatigue 06/05/2013  . Bradycardia 06/05/2013  . Hypertensive renal disease   . Gout   . Thyroid disease   . Coronary artery disease   . Renal disorder     Orientation RESPIRATION BLADDER Height & Weight     Self, Time, Situation, Place  Normal Continent Weight: 93.2 kg Height:  5\' 8"  (172.7 cm)  BEHAVIORAL SYMPTOMS/MOOD NEUROLOGICAL BOWEL NUTRITION STATUS      Continent Diet(See discharge summary)  AMBULATORY STATUS COMMUNICATION OF NEEDS Skin   Extensive Assist Verbally Surgical wounds                       Personal Care Assistance Level of Assistance  Bathing, Dressing Bathing Assistance: Limited assistance   Dressing Assistance: Limited assistance     Functional Limitations Info  Sight, Hearing, Speech Sight Info: Adequate Hearing Info: Impaired Speech Info: Adequate    SPECIAL CARE FACTORS FREQUENCY  PT (By licensed PT), OT (By licensed OT)      PT Frequency: 5 times per week OT Frequency: 5 times per week            Contractures Contractures Info: Not present    Additional Factors Info  Code Status, Allergies Code Status Info: Full code Allergies Info: codeine, morphine, procaine, percocet, sulfa, tramadol           Current Medications (07/17/2019):  This is the current hospital active medication list Current Facility-Administered Medications  Medication Dose Route Frequency Provider Last Rate Last Admin  . 0.9 %  sodium chloride infusion  250 mL Intravenous Continuous Erline Levine, MD      . acetaminophen (TYLENOL) tablet 650 mg  650 mg Oral Q4H PRN Erline Levine, MD       Or  . acetaminophen (TYLENOL) suppository 650 mg  650 mg Rectal Q4H PRN Erline Levine, MD      . acetaminophen (TYLENOL) tablet 1,000 mg  1,000 mg Oral Q8H PRN Erline Levine, MD   1,000 mg at 07/17/19 1016  . alum & mag hydroxide-simeth (MAALOX/MYLANTA) 200-200-20 MG/5ML suspension 30 mL  30 mL Oral Q6H PRN Erline Levine, MD      . bisacodyl (DULCOLAX) suppository 10 mg  10 mg Rectal Daily PRN Erline Levine, MD      . Derrill Memo ON 09/10/2019] ceFAZolin (ANCEF) IVPB 2g/100 mL premix  2 g Intravenous On Call to [place], MD      . Chlorhexidine Gluconate Cloth 2 %  PADS 6 each  6 each Topical Once Erline Levine, MD       And  . Chlorhexidine Gluconate Cloth 2 % PADS 6 each  6 each Topical Once Erline Levine, MD      . cholecalciferol (VITAMIN D) tablet 5,000 Units  5,000 Units Oral Daily Erline Levine, MD   5,000 Units at 07/17/19 1015  . dextrose 5 % and 0.45 % NaCl with KCl 20 mEq/L infusion   Intravenous Continuous Erline Levine, MD      . docusate sodium (COLACE) capsule 100 mg  100 mg Oral BID Erline Levine, MD   100 mg at 07/17/19 1015  . DULoxetine (CYMBALTA) DR capsule 30 mg  30 mg Oral Daily Erline Levine, MD   30 mg at 07/17/19 1014  . finasteride (PROSCAR) tablet 5 mg  5 mg Oral Daily Erline Levine, MD   5 mg at 07/17/19 1015  .  HYDROcodone-acetaminophen (NORCO/VICODIN) 5-325 MG per tablet 1 tablet  1 tablet Oral Q4H PRN Erline Levine, MD      . HYDROcodone-acetaminophen (NORCO/VICODIN) 5-325 MG per tablet 2 tablet  2 tablet Oral Q4H PRN Erline Levine, MD   2 tablet at 07/16/19 2058  . HYDROmorphone (DILAUDID) injection 0.5 mg  0.5 mg Intravenous Q2H PRN Erline Levine, MD      . levothyroxine (SYNTHROID) tablet 125 mcg  125 mcg Oral Q0600 Erline Levine, MD   125 mcg at 07/17/19 0530  . losartan (COZAAR) tablet 100 mg  100 mg Oral Daily Erline Levine, MD   100 mg at 07/17/19 1021  . menthol-cetylpyridinium (CEPACOL) lozenge 3 mg  1 lozenge Oral PRN Erline Levine, MD       Or  . phenol [facility]) mouth spray 1 spray  1 spray Mouth/Throat PRN Erline Levine, MD      . methocarbamol (ROBAXIN) tablet 500 mg  500 mg Oral Q6H PRN Erline Levine, MD   500 mg at 07/16/19 2059   Or  . methocarbamol (ROBAXIN) 500 mg in dextrose 5 % 50 mL IVPB  500 mg Intravenous Q6H PRN Erline Levine, MD      . metoprolol succinate (TOPROL-XL) 24 hr tablet 100 mg  100 mg Oral Daily Erline Levine, MD   100 mg at 07/17/19 1014  . ondansetron (ZOFRAN) tablet 4 mg  4 mg Oral Q6H PRN Erline Levine, MD   4 mg at 07/16/19 1700   Or  . ondansetron (ZOFRAN) injection 4 mg  4 mg Intravenous Q6H PRN Erline Levine, MD      . pantoprazole (PROTONIX) EC tablet 40 mg  40 mg Oral QHS Erline Levine, MD   40 mg at 07/16/19 2059  . polyethylene glycol (MIRALAX / GLYCOLAX) packet 17 g  17 g Oral Daily PRN Erline Levine, MD      . pregabalin (LYRICA) capsule 150 mg  150 mg Oral BID Erline Levine, MD   150 mg at 07/17/19 1014  . sodium chloride flush (NS) 0.9 % injection 3 mL  3 mL Intravenous Q12H Erline Levine, MD   3 mL at 07/17/19 1019  . sodium chloride flush (NS) 0.9 % injection 3 mL  3 mL Intravenous PRN Erline Levine, MD      . sodium phosphate (FLEET) 7-19 GM/118ML enema 1 enema  1 enema Rectal Once PRN Erline Levine, MD      . spironolactone (ALDACTONE)  tablet 25 mg  25 mg Oral Daily Erline Levine, MD   25 mg at 07/17/19 1014  .  tamsulosin (FLOMAX) capsule 0.4 mg  0.4 mg Oral Daily Erline Levine, MD   0.4 mg at 07/17/19 1014  . zolpidem (AMBIEN) tablet 5 mg  5 mg Oral QHS PRN Erline Levine, MD         Discharge Medications: Please see discharge summary for a list of discharge medications.  Relevant Imaging Results:  Relevant Lab Results:   Additional Information SS#: [id number]  Curlene Labrum, RN

## 2019-07-18 NOTE — Plan of Care (Signed)

## 2019-07-18 NOTE — Progress Notes (Signed)
NEUROSURGERY PROGRESS NOTE  Doing well. Complains of appropriate neck soreness. No arm pain No numbness, tingling or weakness Incision CDI  Temp:  [97.7 F (36.5 C)-98 F (36.7 C)] 98 F (36.7 C) (06/05 0825) Pulse Rate:  [53-65] 64 (06/05 0825) Resp:  [14-16] 16 (06/05 0825) BP: (114-140)/(48-70) 140/70 (06/05 0825) SpO2:  [94 %-99 %] 94 % (06/05 0825)  Plan: Continue therapies today. Working on discharge to [employer]. Removed drain today  Eleonore Chiquito, NP 07/18/2019 10:05 AM

## 2019-07-18 NOTE — Progress Notes (Signed)
PT Cancellation Note  Patient Details Name: Bobby Lane MRN: [id number] DOB: 01-Nov-1940   Cancelled Treatment:    Reason Eval/Treat Not Completed: Other (comment). Pt very soundly sleeping, snoring and did not arouse to auditory or gentle tactile stimuli. PT will continue to f/u with pt acutely as available.    Clearnce Sorrel Abron Neddo 07/18/2019, 12:02 PM

## 2019-07-19 NOTE — Progress Notes (Signed)
Neurosurgery Service Progress Note  Subjective: No acute events overnight, no new complaints   Objective: Vitals:   07/18/19 0825 07/18/19 1953 07/19/19 0512 07/19/19 0805  BP: 140/70 125/62 138/70 135/60  Pulse: 64 70 66 75  Resp: 16 12 14 17   Temp: 98 F (36.7 C) 98.6 F (37 C) 97.6 F (36.4 C) 98.2 F (36.8 C)  TempSrc:  Oral Oral Oral  SpO2: 94% 95% 95% 98%  Weight:      Height:       Temp (24hrs), Avg:98.1 F (36.7 C), Min:97.6 F (36.4 C), Max:98.6 F (37 C)  CBC Latest Ref Rng & Units 07/14/2019 11/29/2018 11/28/2018  WBC 4.0 - 10.5 K/uL 7.5 8.8 9.4  Hemoglobin 13.0 - 17.0 g/dL 13.3 10.9(L) 10.9(L)  Hematocrit 39.0 - 52.0 % 40.8 33.0(L) 33.6(L)  Platelets 150 - 400 K/uL 210 269 274   BMP Latest Ref Rng & Units 07/14/2019 01/05/2019 12/03/2018  Glucose 70 - 99 mg/dL 90 94 96  BUN 8 - 23 mg/dL 28(H) 23 18  Creatinine 0.61 - 1.24 mg/dL 1.56(H) 1.01 1.12  BUN/Creat Ratio 10 - 24 - 23 -  Sodium 135 - 145 mmol/L 139 142 137  Potassium 3.5 - 5.1 mmol/L 4.8 3.9 4.2  Chloride 98 - 111 mmol/L 103 103 98  CO2 22 - 32 mmol/L 25 22 31   Calcium 8.9 - 10.3 mg/dL 9.7 9.8 8.7(L)    Intake/Output Summary (Last 24 hours) at 07/19/2019 0906 Last data filed at 07/19/2019 0515 Gross per 24 hour  Intake --  Output 1100 ml  Net -1100 ml    Current Facility-Administered Medications:  .  0.9 %  sodium chloride infusion, 250 mL, Intravenous, Continuous, Erline Levine, MD .  acetaminophen (TYLENOL) tablet 650 mg, 650 mg, Oral, Q4H PRN **OR** acetaminophen (TYLENOL) suppository 650 mg, 650 mg, Rectal, Q4H PRN, Erline Levine, MD .  acetaminophen (TYLENOL) tablet 1,000 mg, 1,000 mg, Oral, Q8H PRN, Erline Levine, MD, 1,000 mg at 07/17/19 1016 .  alum & mag hydroxide-simeth (MAALOX/MYLANTA) 200-200-20 MG/5ML suspension 30 mL, 30 mL, Oral, Q6H PRN, Erline Levine, MD .  bisacodyl (DULCOLAX) suppository 10 mg, 10 mg, Rectal, Daily PRN, Erline Levine, MD .  Derrill Memo ON 09/10/2019] ceFAZolin (ANCEF)  IVPB 2g/100 mL premix, 2 g, Intravenous, On Call to [place], Erline Levine, MD .  6 CHG cloth bath night before surgery, , , Once **AND** 6 CHG cloth bath AM of surgery, , , Once **AND** Chlorhexidine Gluconate Cloth 2 % PADS 6 each, 6 each, Topical, Once **AND** Chlorhexidine Gluconate Cloth 2 % PADS 6 each, 6 each, Topical, Once, Erline Levine, MD .  cholecalciferol (VITAMIN D3) tablet 5,000 Units, 5,000 Units, Oral, Daily, Erline Levine, MD, 5,000 Units at 07/18/19 [phone] .  dextrose 5 % and 0.45 % NaCl with KCl 20 mEq/L infusion, , Intravenous, Continuous, Erline Levine, MD .  docusate sodium (COLACE) capsule 100 mg, 100 mg, Oral, BID, Erline Levine, MD, 100 mg at 07/18/19 2036 .  DULoxetine (CYMBALTA) DR capsule 30 mg, 30 mg, Oral, Daily, Erline Levine, MD, 30 mg at 07/18/19 8250 .  finasteride (PROSCAR) tablet 5 mg, 5 mg, Oral, Daily, Erline Levine, MD, 5 mg at 07/18/19 0926 .  HYDROcodone-acetaminophen (NORCO/VICODIN) 5-325 MG per tablet 1 tablet, 1 tablet, Oral, Q4H PRN, Erline Levine, MD .  HYDROcodone-acetaminophen (NORCO/VICODIN) 5-325 MG per tablet 2 tablet, 2 tablet, Oral, Q4H PRN, Erline Levine, MD, 2 tablet at 07/16/19 2058 .  HYDROmorphone (DILAUDID) injection 0.5 mg, 0.5 mg, Intravenous,  Q2H PRN, Erline Levine, MD .  levothyroxine (SYNTHROID) tablet 125 mcg, 125 mcg, Oral, Q0600, Erline Levine, MD, 125 mcg at 07/19/19 0533 .  losartan (COZAAR) tablet 100 mg, 100 mg, Oral, Daily, Erline Levine, MD, 100 mg at 07/18/19 8616 .  menthol-cetylpyridinium (CEPACOL) lozenge 3 mg, 1 lozenge, Oral, PRN **OR** phenol (CHLORASEPTIC) mouth spray 1 spray, 1 spray, Mouth/Throat, PRN, Erline Levine, MD .  methocarbamol (ROBAXIN) tablet 500 mg, 500 mg, Oral, Q6H PRN, 500 mg at 07/16/19 2059 **OR** methocarbamol (ROBAXIN) 500 mg in dextrose 5 % 50 mL IVPB, 500 mg, Intravenous, Q6H PRN, Erline Levine, MD .  metoprolol succinate (TOPROL-XL) 24 hr tablet 100 mg, 100 mg, Oral, Daily, Erline Levine, MD, 100 mg at  07/18/19 8372 .  ondansetron (ZOFRAN) tablet 4 mg, 4 mg, Oral, Q6H PRN, 4 mg at 07/16/19 1700 **OR** ondansetron (ZOFRAN) injection 4 mg, 4 mg, Intravenous, Q6H PRN, Erline Levine, MD .  pantoprazole (PROTONIX) EC tablet 40 mg, 40 mg, Oral, QHS, Erline Levine, MD, 40 mg at 07/18/19 2035 .  polyethylene glycol (MIRALAX / GLYCOLAX) packet 17 g, 17 g, Oral, Daily PRN, Erline Levine, MD .  pregabalin (LYRICA) capsule 150 mg, 150 mg, Oral, BID, Erline Levine, MD, 150 mg at 07/18/19 2036 .  sodium chloride flush (NS) 0.9 % injection 3 mL, 3 mL, Intravenous, Q12H, Erline Levine, MD, 3 mL at 07/18/19 2100 .  sodium chloride flush (NS) 0.9 % injection 3 mL, 3 mL, Intravenous, PRN, Erline Levine, MD .  sodium phosphate (FLEET) 7-19 GM/118ML enema 1 enema, 1 enema, Rectal, Once PRN, Erline Levine, MD .  spironolactone (ALDACTONE) tablet 25 mg, 25 mg, Oral, Daily, Erline Levine, MD, 25 mg at 07/18/19 0926 .  tamsulosin (FLOMAX) capsule 0.4 mg, 0.4 mg, Oral, Daily, Erline Levine, MD, 0.4 mg at 07/18/19 9021 .  zolpidem (AMBIEN) tablet 5 mg, 5 mg, Oral, QHS PRN, Erline Levine, MD   Physical Exam: AOx3, Strength 5/5 x4 except some pain limited weakness in the L wrist, L hand intrinsic function limited by prior thumb amp, +dec'd sensation in L median distribution, dressing in place c/d/i, soft collar in place  Assessment & Plan: 79 y.o. man s/p C3-6 ACDF / L CTR, recovering well.  -SNF discharge pending, no change in plan of care  Judith Part  07/19/19 9:06 AM

## 2019-07-19 NOTE — Progress Notes (Signed)
Physical Therapy Treatment Patient Details Name: Bobby Lane MRN: [id number] DOB: 06/22/1940 Today's Date: 07/19/2019    History of Present Illness Patient is a 79 yo male admitted with severe cord compression with bilateral foraminal stenosis and bilateral carpal tunnel syndrome. the most affected levels in his neck include C3-4,  C5-6, C6-7 levels.  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  He is now s/p ACDF C3-4, C5-6, and C6-7 and L carpal tunnel release with R planned for later.    PT Comments    Pt making steady progress towards physical therapy goals, remains motivated to get stronger. Currently requiring min guard for functional mobility, but displays impulsivity and decreased safety awareness. Pt ambulating limited hallway distances with a bilateral platform RW. Demonstrates gait abnormalities, balance deficits, and decreased functional use of LUE. Having difficulty performing ADL's I.e. requiring totalA for peri care. Continue to recommend SNF for ongoing Physical Therapy.     Follow Up Recommendations  SNF     Equipment Recommendations  Other (comment)(bilateral platform RW)    Recommendations for Other Services       Precautions / Restrictions Precautions Precautions: Cervical;Fall Precaution Booklet Issued: Yes (comment) Required Braces or Orthoses: Cervical Brace Cervical Brace: Soft collar Restrictions Weight Bearing Restrictions: No    Mobility  Bed Mobility Overal bed mobility: Needs Assistance Bed Mobility: Rolling;Sidelying to Sit Rolling: Modified independent (Device/Increase time) Sidelying to sit: Min guard          Transfers Overall transfer level: Needs assistance Equipment used: Bilateral platform walker Transfers: Sit to/from Stand Sit to Stand: Min guard         General transfer comment: Stood from significantly elevated  bed height and toilet riser with min guard assist  Ambulation/Gait Ambulation/Gait assistance: Min guard Gait Distance (Feet): 200 Feet Assistive device: Bilateral platform walker Gait Pattern/deviations: Step-through pattern;Decreased stride length;Trunk flexed;Decreased stance time - right     General Gait Details: Cues for upright posture, increased left foot clearance. Moderate reliance through arms on walker   Stairs             Wheelchair Mobility    Modified Rankin (Stroke Patients Only)       Balance Overall balance assessment: Needs assistance Sitting-balance support: No upper extremity supported;Feet supported Sitting balance-Leahy Scale: Good     Standing balance support: Bilateral upper extremity supported;During functional activity Standing balance-Leahy Scale: Poor                              Cognition Arousal/Alertness: Awake/alert Behavior During Therapy: Impulsive Overall Cognitive Status: Within Functional Limits for tasks assessed                                        Exercises      General Comments        Pertinent Vitals/Pain Pain Assessment: Faces Faces Pain Scale: Hurts a little bit Pain Location: L wrist Pain Intervention(s): Monitored during session    Home Living                      Prior Function            PT Goals (current goals can now be found in the care plan  section) Acute Rehab PT Goals Patient Stated Goal: Agreeable to short term rehab if needed Potential to Achieve Goals: Good Progress towards PT goals: Progressing toward goals    Frequency    Min 5X/week      PT Plan Current plan remains appropriate    Co-evaluation              AM-PAC PT "6 Clicks" Mobility   Outcome Measure  Help needed turning from your back to your side while in a flat bed without using bedrails?: None Help needed moving from lying on your back to sitting on the side of a flat bed  without using bedrails?: A Little Help needed moving to and from a bed to a chair (including a wheelchair)?: A Little Help needed standing up from a chair using your arms (e.g., wheelchair or bedside chair)?: A Little Help needed to walk in hospital room?: A Little Help needed climbing 3-5 steps with a railing? : A Lot 6 Click Score: 18    End of Session Equipment Utilized During Treatment: Cervical collar;Gait belt Activity Tolerance: Patient tolerated treatment well Patient left: in chair;with call bell/phone within reach Nurse Communication: Mobility status PT Visit Diagnosis: Other abnormalities of gait and mobility (R26.89);Muscle weakness (generalized) (M62.81)     Time: 2536-6440 PT Time Calculation (min) (ACUTE ONLY): 40 min  Charges:  $Gait Training: 23-37 mins $Therapeutic Activity: 8-22 mins                       Wyona Almas, PT, DPT Acute Rehabilitation Services Pager [phone] Office [phone]    Deno Etienne 07/19/2019, 3:10 PM

## 2019-07-19 NOTE — Plan of Care (Signed)
  Problem: Education: Goal: Ability to verbalize activity precautions or restrictions will improve Outcome: Progressing   Problem: Activity: Goal: Ability to avoid complications of mobility impairment will improve Outcome: Progressing   Problem: Bowel/Gastric: Goal: Gastrointestinal status for postoperative course will improve Outcome: Progressing   Problem: Pain Management: Goal: Pain level will decrease Outcome: Progressing   Problem: Skin Integrity: Goal: Will show signs of wound healing Outcome: Progressing   Problem: Health Behavior/Discharge Planning: Goal: Identification of resources available to assist in meeting health care needs will improve Outcome: Progressing   Problem: Bladder/Genitourinary: Goal: Urinary functional status for postoperative course will improve Outcome: Progressing

## 2019-07-19 NOTE — Plan of Care (Signed)
  Problem: Education: Goal: Ability to verbalize activity precautions or restrictions will improve Outcome: Progressing Goal: Knowledge of the prescribed therapeutic regimen will improve Outcome: Progressing Goal: Understanding of discharge needs will improve Outcome: Progressing   

## 2019-07-20 ENCOUNTER — Encounter: Payer: Self-pay | Admitting: *Deleted

## 2019-07-20 LAB — SARS CORONAVIRUS 2 BY RT PCR (HOSPITAL ORDER, PERFORMED IN ~~LOC~~ HOSPITAL LAB): SARS Coronavirus 2: NEGATIVE

## 2019-07-20 NOTE — TOC Transition Note (Signed)
Transition of Care [facility]) - CM/SW Discharge Note   Patient Details  Name: Bobby Lane MRN: [id number] Date of Birth: 1940/11/06  [facility]) CM/SW Contact:  Curlene Labrum, RN Phone Number: 07/20/2019, 2:09 PM   Clinical Narrative:    Case Management left a message with [facility] about bed availability.  Insurance Authorization was started this weekend.  Will follow with the facility to check on patient's transfer once bed availability and insurance Josem Kaufmann is confirmed.   Final next level of care: [place] Barriers to Discharge: Continued Medical Work up   Patient Goals and CMS Choice Patient states their goals for this hospitalization and ongoing recovery are:: Patient agrees to short SNF placement at [facility]. CMS Medicare.gov Compare Post Acute Care list provided to:: Patient Choice offered to / list presented to : Patient  Discharge Placement                       Discharge Plan and Services   Discharge Planning Services: CM Consult Post Acute Care Choice: [place]                               Social Determinants of Health (SDOH) Interventions     Readmission Risk Interventions Readmission Risk Prevention Plan 07/17/2019  Post Dischage Appt Complete  Medication Screening Complete  Transportation Screening Complete  Some recent data might be hidden

## 2019-07-20 NOTE — Progress Notes (Addendum)
Subjective: Patient reports "I'm doing better. Some numbness still in these fingers(left)"  Objective: Vital signs in last 24 hours: Temp:  [97.8 F (36.6 C)-98.5 F (36.9 C)] 98 F (36.7 C) (06/07 0729) Pulse Rate:  [66-82] 68 (06/07 0729) Resp:  [12-17] 17 (06/07 0729) BP: (136-140)/(64-70) 136/64 (06/07 0729) SpO2:  [95 %-99 %] 98 % (06/07 0729)  Intake/Output from previous day: 06/06 0701 - 06/07 0700 In: 963 [P.O.:960; I.V.:3] Out: 300 [Urine:300] Intake/Output this shift: Total I/O In: -  Out: 350 [Urine:350]  Alert, conversant, reports only numbness in the fingers of the left hand. No pain complaints this am. Moving fingers of left hand without difficulty, and reports relief from pre-op pain. Anetrior cervial incision flat without erythem or drainage. Soft collar in use. Left wrist dressing clean and intact. Good strength BUE.   Lab Results: No results for input(s): WBC, HGB, HCT, PLT in the last 72 hours. BMET No results for input(s): NA, K, CL, CO2, GLUCOSE, BUN, CREATININE, CALCIUM in the last 72 hours.  Studies/Results: No results found.  Assessment/Plan: improving  LOS: 4 days  Ok per DrStern to d/c to SNF [facility]) when bed available. Ok to remove left wrist dressing today & leave open to air. Wash both incisions with baths and prn, avoiding lotions, oils, creams to sites. Office visit next week for removal of wrist sutures.    Verdis Prime 07/20/2019, 8:27 AM   Awaiting SNF placement

## 2019-07-20 NOTE — Plan of Care (Signed)
  Problem: Education: Goal: Knowledge of General Education information will improve Description: Including pain rating scale, medication(s)/side effects and non-pharmacologic comfort measures Outcome: Adequate for Discharge   Problem: Health Behavior/Discharge Planning: Goal: Ability to manage health-related needs will improve Outcome: Adequate for Discharge   Problem: Activity: Goal: Risk for activity intolerance will decrease Outcome: Adequate for Discharge   Problem: Nutrition: Goal: Adequate nutrition will be maintained Outcome: Adequate for Discharge   Problem: Coping: Goal: Level of anxiety will decrease Outcome: Adequate for Discharge   Problem: Elimination: Goal: Will not experience complications related to bowel motility Outcome: Adequate for Discharge   Problem: Pain Managment: Goal: General experience of comfort will improve Outcome: Adequate for Discharge   Problem: Safety: Goal: Ability to remain free from injury will improve Outcome: Adequate for Discharge   Problem: Skin Integrity: Goal: Risk for impaired skin integrity will decrease Outcome: Adequate for Discharge

## 2019-07-21 MED ORDER — HYDROCODONE-ACETAMINOPHEN 5-325 MG PO TABS: 1.0000 | ORAL_TABLET | ORAL | 0 refills | Status: DC | PRN

## 2019-07-21 NOTE — TOC Transition Note (Signed)
Transition of Care [facility]) - CM/SW Discharge Note   Patient Details  Name: KIERNAN ATKERSON MRN: [id number] Date of Birth: Aug 10, 1940  [facility]) CM/SW Contact:  Curlene Labrum, RN Phone Number: 07/21/2019, 11:22 AM   Clinical Narrative:     Case management met with the patient this morning to let him know that he had bed availability and discharge today to [facility].  I spoke with [name] to confirm this and PTAR called for transport at 1100 today.  The patient was notified and family, Own Bertino, son, was notified of his transfer to [facility].  Final next level of care: [place] Barriers to Discharge: Continued Medical Work up   Patient Goals and CMS Choice Patient states their goals for this hospitalization and ongoing recovery are:: Patient agrees to short SNF placement at [facility]. CMS Medicare.gov Compare Post Acute Care list provided to:: Patient Choice offered to / list presented to : Patient  Discharge Placement                       Discharge Plan and Services   Discharge Planning Services: CM Consult Post Acute Care Choice: [place]                               Social Determinants of Health (SDOH) Interventions     Readmission Risk Interventions Readmission Risk Prevention Plan 07/17/2019  Post Dischage Appt Complete  Medication Screening Complete  Transportation Screening Complete  Some recent data might be hidden

## 2019-07-21 NOTE — Progress Notes (Signed)
Subjective: Patient reports "I feel a whole 100% better". He reports no neck pain. He continues to have c/o some mild numbness and tingling in his left fingers, mostly pinky finger.  Objective: Vital signs in last 24 hours: Temp:  [97.6 F (36.4 C)-98.3 F (36.8 C)] 97.6 F (36.4 C) (06/08 0254) Pulse Rate:  [68-70] 69 (06/08 0254) Resp:  [14-18] 17 (06/08 0254) BP: (111-151)/(65-67) 151/67 (06/08 0254) SpO2:  [95 %-97 %] 95 % (06/08 0254)  Intake/Output from previous day: 06/07 0701 - 06/08 0700 In: 960 [P.O.:960] Out: 1550 [Urine:1550] Intake/Output this shift: No intake/output data recorded.  Patient sitting up in bed eating breakfast. Alert and oriented X 4. Conversant, reports only numbness in the fingers of the left hand. No pain complaints this am. Moving fingers of left hand without difficulty, and reports relief from pre-op pain. Anetrior cervial incision flat without erythema or drainage. Soft collar in use. Left wrist dressing has been removed. Anterior wrist incision is intact, flat without drainage or erythema. Good strength in his BUE.   Lab Results: No results for input(s): WBC, HGB, HCT, PLT in the last 72 hours. BMET No results for input(s): NA, K, CL, CO2, GLUCOSE, BUN, CREATININE, CALCIUM in the last 72 hours.  Studies/Results: No results found.  Assessment/Plan: Plan to d/c to SNF [facility]) today. Continue cervical collar. Wash both incisions with baths and prn, avoiding lotions, oils, creams to sites. Office visit next week for removal of wrist sutures.    LOS: 4 days     Verdis Prime 07/21/2019, 7:50 AM   To SNF today

## 2019-07-21 NOTE — Progress Notes (Signed)
Report given to RN at [facility]. All questions answered. Pt belongings gathered to be sent with him. PTAR to transport.

## 2019-07-21 NOTE — Plan of Care (Signed)
°  Problem: Education: Goal: Ability to verbalize activity precautions or restrictions will improve Outcome: Adequate for Discharge   Problem: Activity: Goal: Ability to avoid complications of mobility impairment will improve Outcome: Adequate for Discharge   Problem: Pain Management: Goal: Pain level will decrease Outcome: Adequate for Discharge

## 2019-07-21 NOTE — Care Management Important Message (Signed)
Important Message  Patient Details  Name: Bobby Lane MRN: [id number] Date of Birth: December 07, 1940   Medicare Important Message Given:  Yes     Orbie Pyo 07/21/2019, 12:18 PM

## 2019-07-21 NOTE — Discharge Summary (Signed)
Physician Discharge Summary  Patient ID: Bobby Lane MRN: [id number] DOB/AGE: 77-Sep-1942 77 y.o.  Admit date: 07/16/2019 Discharge date: 07/20/2019  Admission Diagnoses: Cervical myelopathy, Bilateral carpal tunnel syndrome, cervical stenosis, herniated cervical disc C 34, C 56, C 67 levels, left carpal tunnel syndrome    Discharge Diagnoses: Cervical myelopathy, Bilateral carpal tunnel syndrome, cervical stenosis, herniated cervical disc C 34, C 56, C 67 levels, left carpal tunnel syndrome s/p Cervical three-four Cervical five-six Cervical six-seven Anterior cervical decompression/discectomy/fusion (N/A) - 3C LEFT CARPAL TUNNEL RELEASE (Left) with PEEK cages, autograft, plate     Active Problems:   Cervical myelopathy [facility])   Discharged Condition: good  Hospital Course: Bobby Lane was admitted for surgery with dx cervical stenosis with myelopathy and carpal tunnel syndrome. Following uncomplicated surgeries (above), he recovered well and transferred to [facility] for nursing care and therapies.   Consults: None  Significant Diagnostic Studies: surgery  Treatments: surgery: Cervical three-four Cervical five-six Cervical six-seven Anterior cervical decompression/discectomy/fusion (N/A) - 3C LEFT CARPAL TUNNEL RELEASE (Left) with PEEK cages, autograft, plate    Discharge Exam: Blood pressure 136/64, pulse 68, temperature 98 F (36.7 C), temperature source Oral, resp. rate 17, height 5\' 8"  (1.727 m), weight 93.2 kg, SpO2 98 %. Alert, conversant, reports only numbness in the fingers of the left hand. No pain complaints this am. Moving fingers of left hand without difficulty, and reports relief from pre-op pain. Anetrior cervial incision flat without erythem or drainage. Soft collar in use. Left wrist dressing clean and intact. Good strength BUE.    Disposition: Discharge disposition: 03-Skilled Nursing Facility  [facility]) when bed available. Ok to remove  left wrist dressing today & leave open to air. Wash both incisions with baths and prn, avoiding lotions, oils, creams to sites. Office visit next week for removal of wrist sutures.        Discharge Instructions     Remove dressing in 72 hours   Complete by: As directed    Diet - low sodium heart healthy   Complete by: As directed    Increase activity slowly   Complete by: As directed      Allergies as of 07/21/2019      Reactions   Codeine Nausea And Vomiting   Morphine And Related Nausea And Vomiting   Novocain [procaine] Hives   NO TROUBLE IN OR   Other Nausea And Vomiting   Percocet [oxycodone-acetaminophen] Nausea And Vomiting   Sulfa Antibiotics Hives   Tramadol Nausea And Vomiting   Note: upset stomach Note: upset stomach      Medication List    TAKE these medications   acetaminophen 650 MG CR tablet Commonly known as: TYLENOL Take 1,300 mg by mouth every 8 (eight) hours as needed for pain.   acetaminophen 325 MG tablet Commonly known as: TYLENOL Take 2 tablets (650 mg total) by mouth every 8 (eight) hours.   allopurinol 300 MG tablet Commonly known as: ZYLOPRIM TAKE 1 TABLET(300 MG) BY MOUTH DAILY What changed: See the new instructions.   DULoxetine 30 MG capsule Commonly known as: CYMBALTA Take 1 capsule (30 mg total) by mouth 2 (two) times daily. What changed: Another medication with the same name was changed. Make sure you understand how and when to take each.   DULoxetine 30 MG capsule Commonly known as: CYMBALTA TAKE ONE CAPSULE BY MOUTH TWICE DAILY What changed: when to take this   finasteride 5 MG tablet Commonly known as: Proscar Take 1 tablet (5 mg  total) by mouth daily.   HYDROcodone-acetaminophen 5-325 MG tablet Commonly known as: NORCO/VICODIN Take 1 tablet by mouth every 4 (four) hours as needed for moderate pain ((score 4 to 6)).   losartan 100 MG tablet Commonly known as: COZAAR Take 1 tablet (100 mg total) by mouth daily.    metoprolol succinate 100 MG 24 hr tablet Commonly known as: TOPROL-XL TAKE 1 TABLET(100 MG) BY MOUTH DAILY What changed: See the new instructions.   pregabalin 150 MG capsule Commonly known as: Lyrica Take 1 capsule (150 mg total) by mouth 2 (two) times daily.   spironolactone 25 MG tablet Commonly known as: ALDACTONE Take 1 tablet (25 mg total) by mouth daily.   SUPER B COMPLEX PO Take 1 tablet by mouth daily.   Synthroid 125 MCG tablet Generic drug: levothyroxine Take 125 mcg by mouth daily before breakfast.   tamsulosin 0.4 MG Caps capsule Commonly known as: FLOMAX Take 1 capsule (0.4 mg total) by mouth daily.   Vitamin D3 125 MCG (5000 UT) Tabs Take 5,000 Units by mouth daily.        Signed: Peggyann Shoals, MD 07/21/2019, 8:11 AM

## 2019-07-27 ENCOUNTER — Ambulatory Visit: Payer: Medicare Other | Admitting: Podiatry

## 2019-07-29 IMAGING — US US RENAL
1 series · 14 of 25 positions shown · non-contrast
Comparison: CT 09/03/2013

CLINICAL DATA: Chronic kidney disease stage 3

EXAM:
RENAL / URINARY TRACT ULTRASOUND COMPLETE

[Series 1: us renal · 0.26mm/px · 14 of 34 slices shown]
[im 1/34]
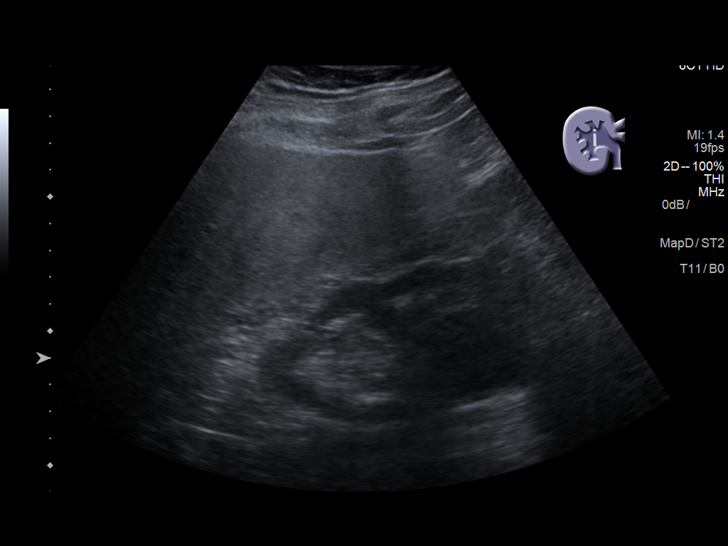
[im 3/34]
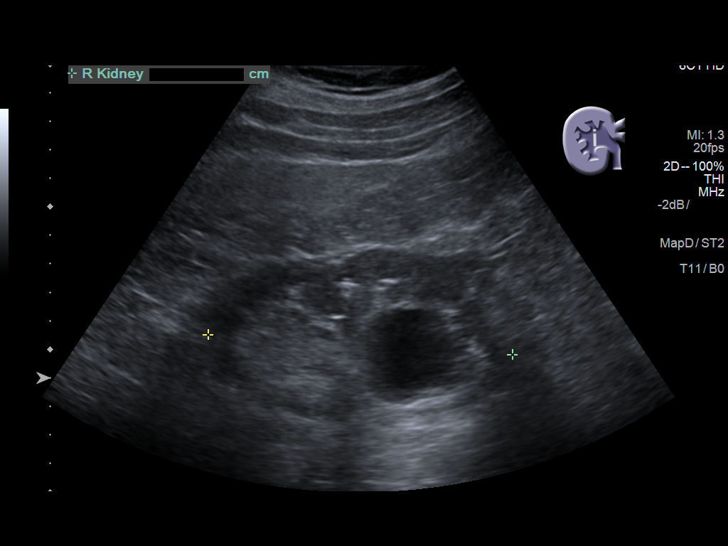
[im 6/34]
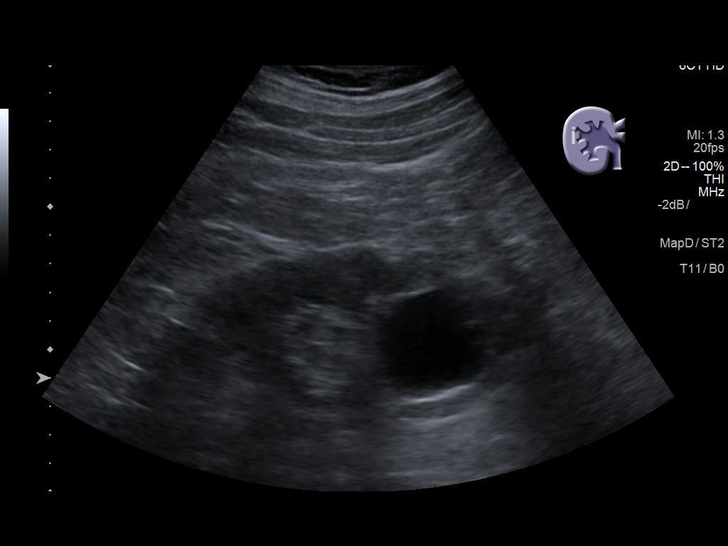
[im 9/34]
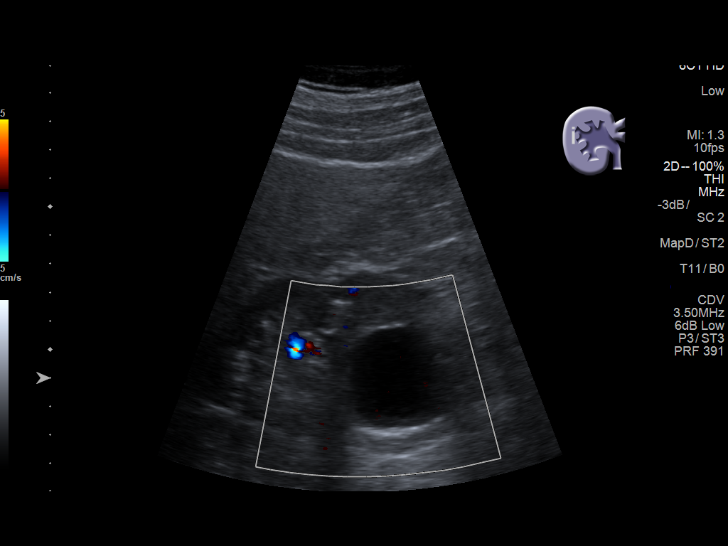
[im 12/34]
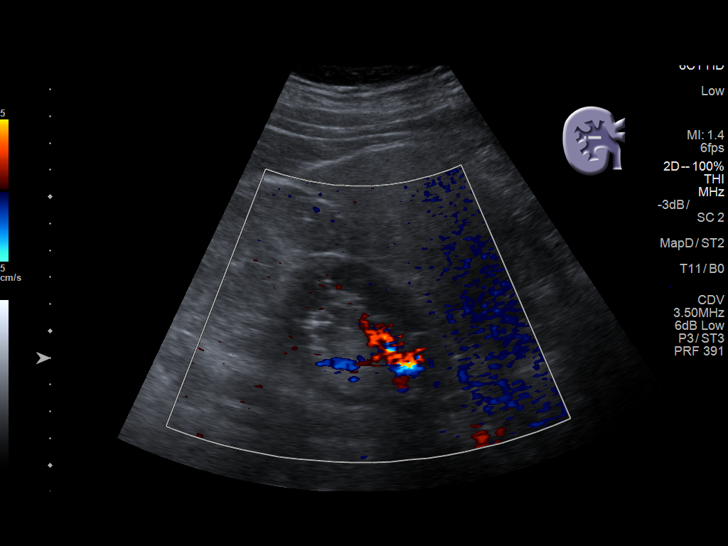
[im 13/34]
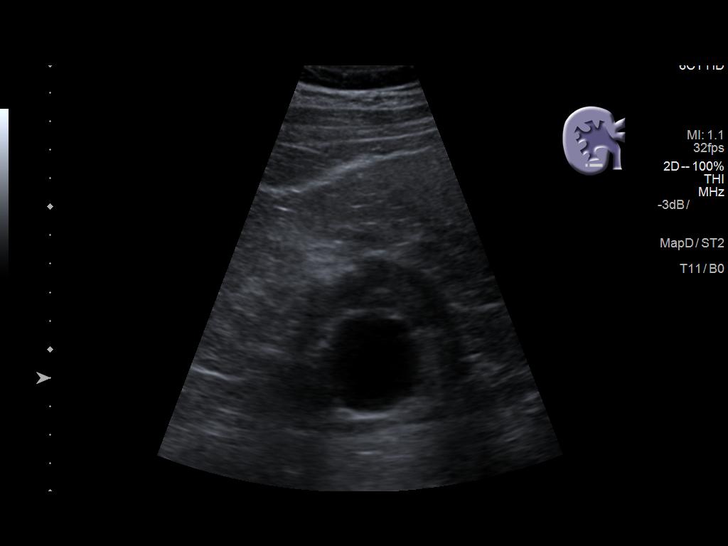
[im 16/34]
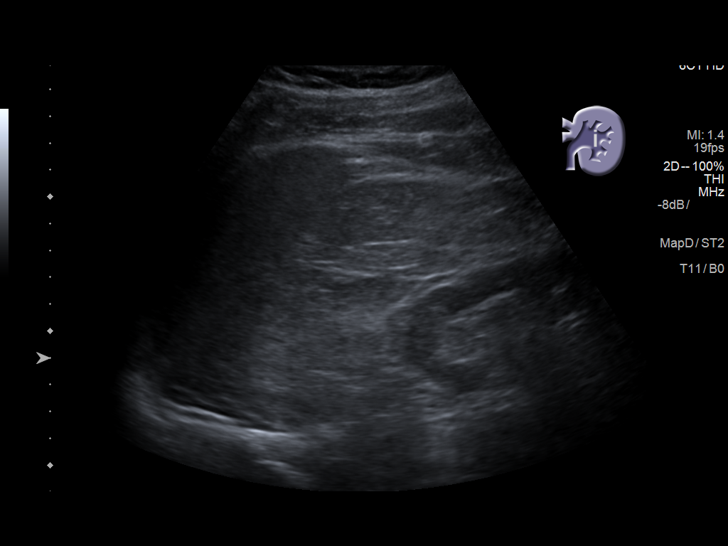
[im 18/34]
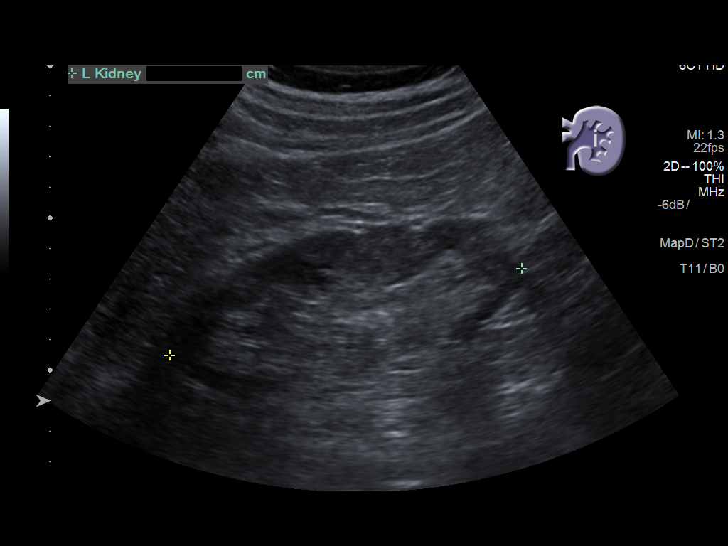
[im 21/34]
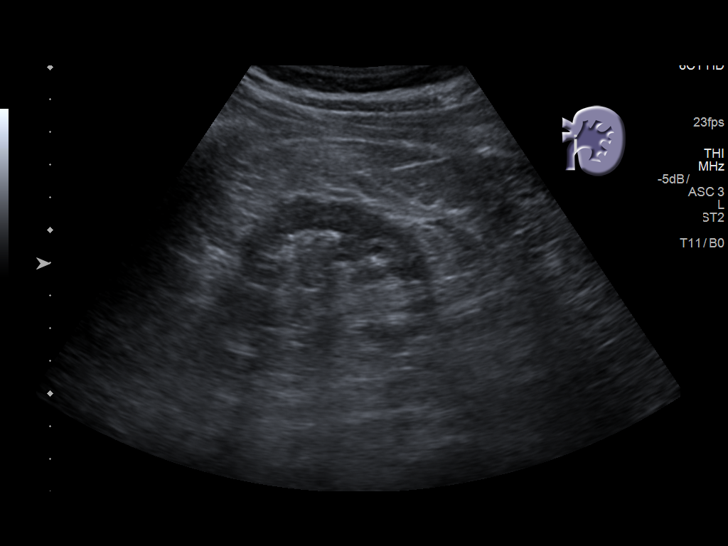
[im 23/34]
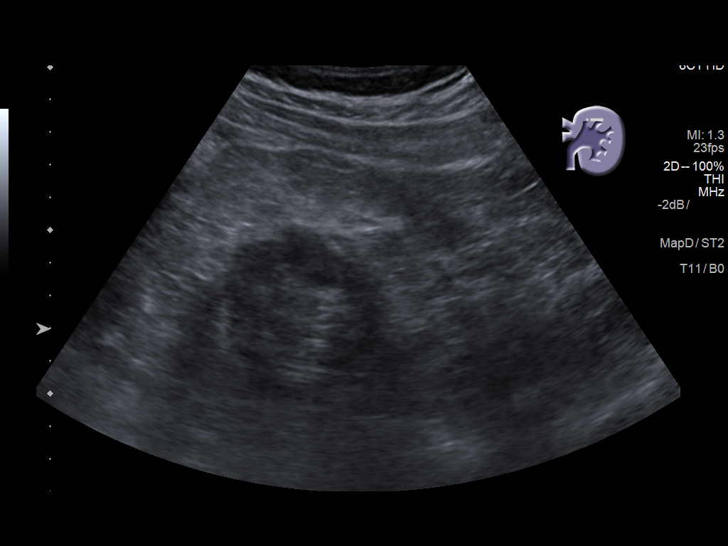
[im 25/34]
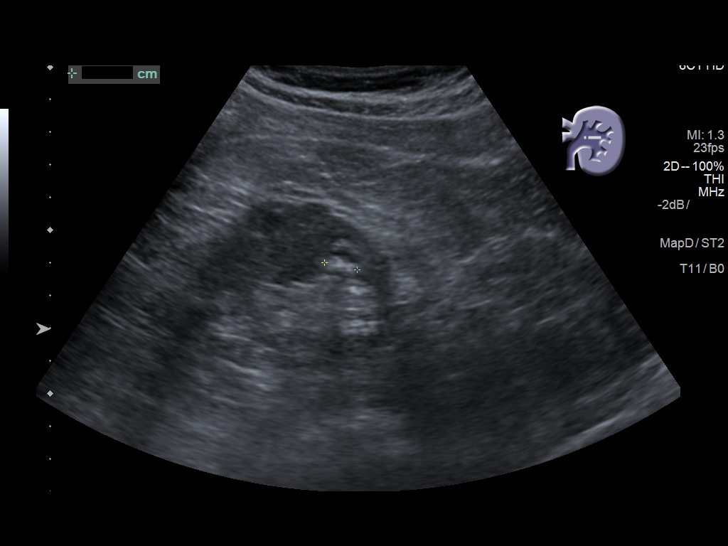
[im 28/34]
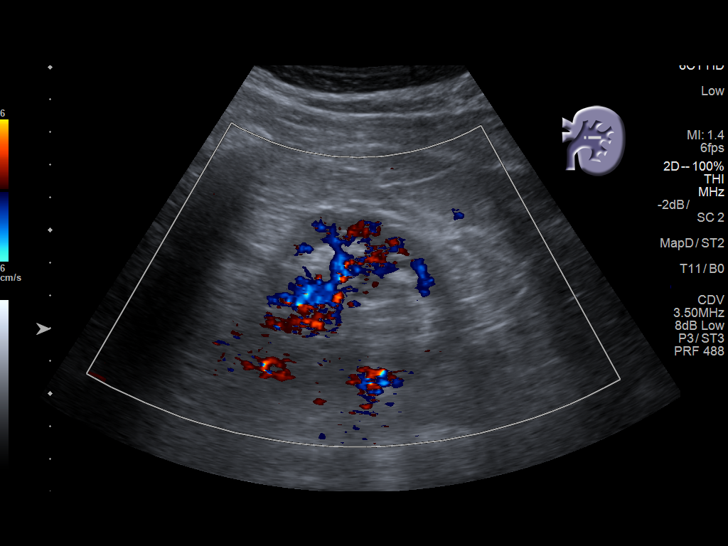
[im 31/34]
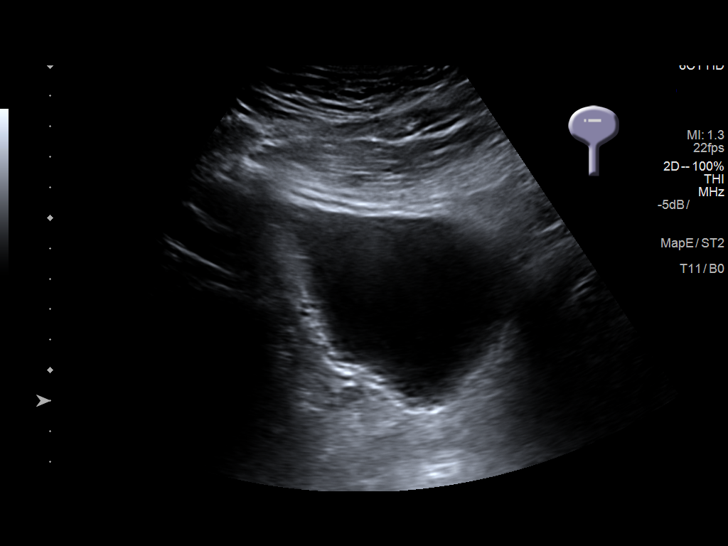
[im 34/34]
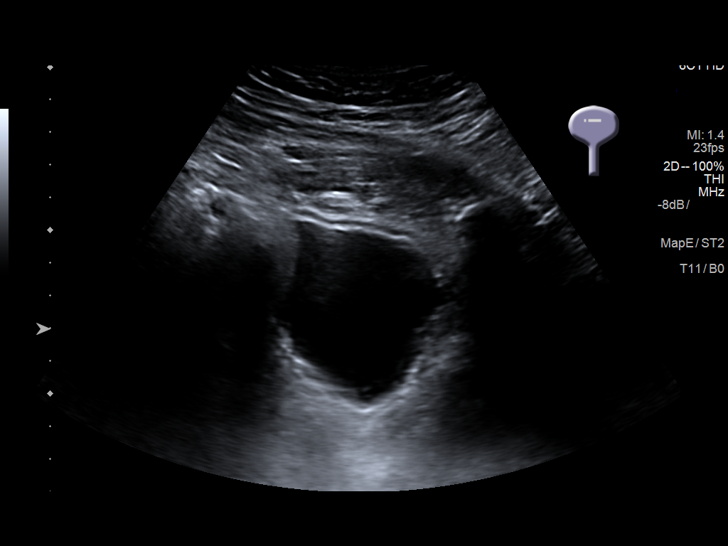

[14 of 25 positions shown; findings below may reference images not displayed]

FINDINGS: Right Kidney:

Length: 10.7 cm. 4.3 cm cyst in the lower pole, parapelvic region.
Mild cortical thinning. No hydronephrosis. Normal echotexture.

Left Kidney:

Length: 11.9 cm. Mild cortical thinning. Normal echotexture.
Probable nonobstructing stones, the largest 12 mm in the midpole. No
hydronephrosis.

Bladder:

Appears normal for degree of bladder distention.
IMPRESSION: Bilateral renal cortical thinning.

No hydronephrosis.

Left nephrolithiasis.

## 2019-08-06 ENCOUNTER — Ambulatory Visit: Payer: Medicare Other | Admitting: Urology

## 2019-08-06 ENCOUNTER — Encounter: Payer: Self-pay | Admitting: Urology

## 2019-08-06 ENCOUNTER — Other Ambulatory Visit: Payer: Self-pay

## 2019-08-06 VITALS — BP 111/60 | HR 69 | Ht 66.0 in | Wt 209.0 lb

## 2019-08-06 DIAGNOSIS — N401 Enlarged prostate with lower urinary tract symptoms: Secondary | ICD-10-CM

## 2019-08-06 DIAGNOSIS — R3916 Straining to void: Secondary | ICD-10-CM | POA: Diagnosis not present

## 2019-08-06 DIAGNOSIS — N3941 Urge incontinence: Secondary | ICD-10-CM

## 2019-08-06 LAB — BLADDER SCAN AMB NON-IMAGING: Scan Result: 44

## 2019-08-06 NOTE — Patient Instructions (Signed)
Call us in one month

## 2019-08-06 NOTE — Progress Notes (Signed)
08/06/2019 1:14 PM   Bobby Lane [birth year] [id number]  Referring provider: Volney American, PA-C [place],  [place] [address]  Chief Complaint  Patient presents with   Follow-up    Urologic history: 1.  BPH with LUTS -History urinary retention 11/2018 resolved with Foley catheter drainage -Started tamsulosin -Finasteride added 1/21  2.  Nephrolithiasis -RUS 04/2017 with nephrolithiasis -CT 11/2018 punctate bilateral, nonobstructing renal calculi -Metabolic evaluation low urine volume (1.6 L), hypocitraturia low urine pH   HPI: 79 y.o. male presents for semiannual follow-up  -Last seen by Debroah Loop 02/2019 and finasteride added to tamsulosin -Does note improvement in obstructive voiding symptoms -Most bothersome symptom is urgency with urge incontinence -IPSS today 9/35 with most bothersome symptom of urgency -Denies dysuria, gross hematuria -Denies flank, abdominal or pelvic pain    PMH: Past Medical History:  Diagnosis Date   Arthritis    Cancer ([place])    SKIN   COPD (chronic obstructive pulmonary disease) ([place])    Coronary artery disease    Depression    Dyspnea    DOE   GERD (gastroesophageal reflux disease)    Gout    Heart murmur    History of hiatal hernia    History of kidney stones    History of wheezing    HOH (hard of hearing)    Hypertension    Hypothyroidism    Oxygen deficit    PRN USE; used ~ 2014 for less than a year (no longer has as of 07/15/19)   Pneumonia    Renal disorder    Thyroid disease     Surgical History: Past Surgical History:  Procedure Laterality Date   ANTERIOR CERVICAL DECOMP/DISCECTOMY FUSION N/A 07/16/2019   Procedure: Cervical three-four Cervical five-six Cervical six-seven Anterior cervical decompression/discectomy/fusion;  Surgeon: Erline Levine, MD;  Location: [place];  Service: Neurosurgery;  Laterality: N/A;  3C   CARDIAC CATHETERIZATION     CARPAL TUNNEL RELEASE  Left 07/16/2019   Procedure: LEFT CARPAL TUNNEL RELEASE;  Surgeon: Erline Levine, MD;  Location: [place];  Service: Neurosurgery;  Laterality: Left;   CATARACT EXTRACTION W/PHACO Right 08/21/2016   Procedure: CATARACT EXTRACTION PHACO AND INTRAOCULAR LENS PLACEMENT (IOC);  Surgeon: Birder Robson, MD;  Location: [facility];  Service: Ophthalmology;  Laterality: Right;  [place] 00:54.9 AP% 18.3 CDE 10.04 Fluid pack lot # [id number]   CATARACT EXTRACTION W/PHACO Left 09/18/2016   Procedure: CATARACT EXTRACTION PHACO AND INTRAOCULAR LENS PLACEMENT (IOC);  Surgeon: Birder Robson, MD;  Location: [facility];  Service: Ophthalmology;  Laterality: Left;  [place] 00:56 AP% 19.7 CDE 11.07 Fluid pack lot # [id number]   FRACTURE SURGERY     ANKLE   HERNIA REPAIR     UPPER GI ENDOSCOPY      Home Medications:  Allergies as of 08/06/2019      Reactions   Codeine Nausea And Vomiting   Morphine And Related Nausea And Vomiting   Novocain [procaine] Hives   NO TROUBLE IN OR   Other Nausea And Vomiting   Percocet [oxycodone-acetaminophen] Nausea And Vomiting   Sulfa Antibiotics Hives   Tramadol Nausea And Vomiting   Note: upset stomach Note: upset stomach      Medication List       Accurate as of August 06, 2019  1:14 PM. If you have any questions, ask your nurse or doctor.        acetaminophen 650 MG CR tablet Commonly known as: TYLENOL Take 1,300 mg by  mouth every 8 (eight) hours as needed for pain.   acetaminophen 325 MG tablet Commonly known as: TYLENOL Take 2 tablets (650 mg total) by mouth every 8 (eight) hours.   allopurinol 300 MG tablet Commonly known as: ZYLOPRIM TAKE 1 TABLET(300 MG) BY MOUTH DAILY What changed: See the new instructions.   DULoxetine 30 MG capsule Commonly known as: CYMBALTA Take 1 capsule (30 mg total) by mouth 2 (two) times daily. What changed: Another medication with the same name was changed. Make sure you understand how and when to take each.   DULoxetine 30 MG  capsule Commonly known as: CYMBALTA TAKE ONE CAPSULE BY MOUTH TWICE DAILY What changed: when to take this   finasteride 5 MG tablet Commonly known as: Proscar Take 1 tablet (5 mg total) by mouth daily.   HYDROcodone-acetaminophen 5-325 MG tablet Commonly known as: NORCO/VICODIN Take 1 tablet by mouth every 4 (four) hours as needed for moderate pain ((score 4 to 6)).   losartan 100 MG tablet Commonly known as: COZAAR Take 1 tablet (100 mg total) by mouth daily.   metoprolol succinate 100 MG 24 hr tablet Commonly known as: TOPROL-XL TAKE 1 TABLET(100 MG) BY MOUTH DAILY What changed: See the new instructions.   pregabalin 150 MG capsule Commonly known as: Lyrica Take 1 capsule (150 mg total) by mouth 2 (two) times daily.   spironolactone 25 MG tablet Commonly known as: ALDACTONE Take 1 tablet (25 mg total) by mouth daily.   SUPER B COMPLEX PO Take 1 tablet by mouth daily.   Synthroid 125 MCG tablet Generic drug: levothyroxine Take 125 mcg by mouth daily before breakfast.   tamsulosin 0.4 MG Caps capsule Commonly known as: FLOMAX Take 1 capsule (0.4 mg total) by mouth daily.   Vitamin D3 125 MCG (5000 UT) Tabs Take 5,000 Units by mouth daily.       Allergies:  Allergies  Allergen Reactions   Codeine Nausea And Vomiting   Morphine And Related Nausea And Vomiting   Novocain [Procaine] Hives    NO TROUBLE IN OR   Other Nausea And Vomiting   Percocet [Oxycodone-Acetaminophen] Nausea And Vomiting   Sulfa Antibiotics Hives   Tramadol Nausea And Vomiting    Note: upset stomach Note: upset stomach     Family History: Family History  Problem Relation Age of Onset   Brain cancer Father    Prostate cancer Neg Hx    Bladder Cancer Neg Hx    Kidney cancer Neg Hx     Social History:  reports that he has never smoked. He has never used smokeless tobacco. He reports that he does not drink alcohol and does not use drugs.   Physical Exam: BP 111/60     Pulse 69    Ht 5\' 6"  (1.676 m)    Wt 209 lb (94.8 kg)    BMI 33.73 kg/m   Constitutional:  Alert and oriented, No acute distress. HEENT: [place] AT, moist mucus membranes.  Trachea midline, no masses. Cardiovascular: No clubbing, cyanosis, or edema. Respiratory: Normal respiratory effort, no increased work of breathing. GI: Abdomen is soft, nontender, nondistended, no abdominal masses Skin: No rashes, bruises or suspicious lesions. Psychiatric: Normal mood and affect.  Assessment & Plan:    1.  BPH with LUTS -Significant bother score secondary to urgency -On combination therapy which he will continue -PVR by bladder scan 44 mL -Trial Myrbetriq 25 mg daily; samples given and he will call back regarding efficacy -Cystoscopy if no significant  improvement  2. Nephrolithiasis -Asymptomatic -KUB next follow-up   Abbie Sons, MD  [facility] [address] [place], [place] [address] [phone]

## 2019-08-28 ENCOUNTER — Telehealth: Payer: Self-pay

## 2019-08-28 ENCOUNTER — Ambulatory Visit: Payer: Medicare Other

## 2019-08-28 NOTE — Telephone Encounter (Signed)
This nurse called patient in order to perform scheduled telephonic AWV. Patient states that he no longer goes to this office for primary care. He now goes to [facility] in [place].

## 2019-09-09 NOTE — Progress Notes (Signed)
Your procedure is scheduled on Tuesday, August 3rd.  Report to [facility] Main Entrance "A" at 10:00 A.M., and check in at the Admitting office.  Call this number if you have problems the morning of surgery:  [phone]  Call [phone] if you have any questions prior to your surgery date Monday-Friday 8am-4pm    Remember:  Do not eat or drink after midnight the night before your surgery   Take these medicines the morning of surgery with A SIP OF WATER  allopurinol (ZYLOPRIM)  DULoxetine (CYMBALTA) finasteride (PROSCAR) metoprolol succinate (TOPROL-XL) pregabalin (LYRICA)  SYNTHROID  tamsulosin (FLOMAX)   If needed - acetaminophen (TYLENOL), HYDROcodone-acetaminophen (NORCO/VICODIN)    As of today, STOP taking any Aspirin (unless otherwise instructed by your surgeon) Aleve, Naproxen, Ibuprofen, Motrin, Advil, Goody's, BC's, all herbal medications, fish oil, and all vitamins.             Do not wear jewelry.            Do not wear lotions, powders ,colognes, or deodorant.            Men may shave face and neck.            Do not bring valuables to the hospital.            [facility] is not responsible for any belongings or valuables.  Do NOT Smoke (Tobacco/Vaping) or drink Alcohol 24 hours prior to your procedure If you use a CPAP at night, you may bring all equipment for your overnight stay.   Contacts, glasses, dentures or bridgework may not be worn into surgery.      For patients admitted to the hospital, discharge time will be determined by your treatment team.   Patients discharged the day of surgery will not be allowed to drive home, and someone needs to stay with them for 24 hours.  Special instructions:   [facility]- Preparing For Surgery  Before surgery, you can play an important role. Because skin is not sterile, your skin needs to be as free of germs as possible. You can reduce the number of germs on your skin by washing with CHG (chlorahexidine gluconate)  Soap before surgery.  CHG is an antiseptic cleaner which kills germs and bonds with the skin to continue killing germs even after washing.    Oral Hygiene is also important to reduce your risk of infection.  Remember - BRUSH YOUR TEETH THE MORNING OF SURGERY WITH YOUR REGULAR TOOTHPASTE  Please do not use if you have an allergy to CHG or antibacterial soaps. If your skin becomes reddened/irritated stop using the CHG.  Do not shave (including legs and underarms) for at least 48 hours prior to first CHG shower. It is OK to shave your face.  Please follow these instructions carefully.   1. Shower the NIGHT BEFORE SURGERY and the MORNING OF SURGERY with CHG Soap.   2. If you chose to wash your hair, wash your hair first as usual with your normal shampoo.  3. After you shampoo, rinse your hair and body thoroughly to remove the shampoo.  4. Use CHG as you would any other liquid soap. You can apply CHG directly to the skin and wash gently with a scrungie or a clean washcloth.   5. Apply the CHG Soap to your body ONLY FROM THE NECK DOWN.  Do not use on open wounds or open sores. Avoid contact with your eyes, ears, mouth and genitals (private parts). Wash Face  and genitals (private parts)  with your normal soap.   6. Wash thoroughly, paying special attention to the area where your surgery will be performed.  7. Thoroughly rinse your body with warm water from the neck down.  8. DO NOT shower/wash with your normal soap after using and rinsing off the CHG Soap.  9. Pat yourself dry with a CLEAN TOWEL.  10. Wear CLEAN PAJAMAS to bed the night before surgery  11. Place CLEAN SHEETS on your bed the night of your first shower and DO NOT SLEEP WITH PETS.  Day of Surgery: Wear Clean/Comfortable clothing the morning of surgery Do not apply any deodorants/lotions.   Remember to brush your teeth WITH YOUR REGULAR TOOTHPASTE.   Please read over the following fact sheets that you were  given.

## 2019-09-10 ENCOUNTER — Inpatient Hospital Stay (HOSPITAL_COMMUNITY)
Admission: RE | Admit: 2019-09-10 | Discharge: 2019-09-10 | Disposition: A | Discharge status: Home / Self Care | Payer: Medicare Other | Source: Ambulatory Visit

## 2019-09-11 ENCOUNTER — Inpatient Hospital Stay: Admission: RE | Admit: 2019-09-11 | Payer: Medicare Other | Source: Ambulatory Visit

## 2019-10-01 ENCOUNTER — Ambulatory Visit (INDEPENDENT_AMBULATORY_CARE_PROVIDER_SITE_OTHER): Payer: Medicare Other | Admitting: Podiatry

## 2019-10-01 ENCOUNTER — Encounter: Payer: Self-pay | Admitting: Podiatry

## 2019-10-01 ENCOUNTER — Other Ambulatory Visit: Payer: Self-pay

## 2019-10-01 DIAGNOSIS — B351 Tinea unguium: Secondary | ICD-10-CM | POA: Diagnosis not present

## 2019-10-01 DIAGNOSIS — N289 Disorder of kidney and ureter, unspecified: Secondary | ICD-10-CM

## 2019-10-01 DIAGNOSIS — M79675 Pain in left toe(s): Secondary | ICD-10-CM | POA: Diagnosis not present

## 2019-10-01 DIAGNOSIS — M79674 Pain in right toe(s): Secondary | ICD-10-CM | POA: Diagnosis not present

## 2019-10-01 NOTE — Progress Notes (Signed)
This patient returns to my office for at risk foot care.  This patient requires this care by a professional since this patient will be at risk due to having renal disease.  This patient is unable to cut nails himself  since the patient cannot reach his  nails.These nails are painful walking and wearing shoes.  This patient presents for at risk foot care today.    General Appearance  Alert, conversant and in no acute stress.  Vascular  Dorsalis pedis and posterior tibial  pulses are not  palpable  bilaterally.  Capillary return is within normal limits  bilaterally. Temperature is within normal limits  bilaterally.  Neurologic  Senn-Weinstein monofilament wire test within normal limits  bilaterally. Muscle power within normal limits bilaterally.  Nails Thick disfigured discolored nails with subungual debris  from hallux to fifth toes bilaterally. No evidence of bacterial infection or drainage bilaterally.  Orthopedic  No limitations of motion  feet .  No crepitus or effusions noted.  No bony pathology or digital deformities noted.  Skin  normotropic skin with no porokeratosis noted bilaterally.  No signs of infections or ulcers noted.     Onychomycosis  Pain in right toes  Pain in left toes  Consent was obtained for treatment procedures.   Mechanical debridement of nails 1-5  bilaterally performed with a nail nipper.  Filed with dremel without incident. No infection or ulcer.     Return office visit   3 months        Told patient to return for periodic foot care and evaluation due to potential at risk complications.   Nykiah Ma DPM  

## 2019-10-05 ENCOUNTER — Encounter: Payer: Self-pay | Admitting: Physical Medicine & Rehabilitation

## 2019-10-16 ENCOUNTER — Encounter: Payer: Medicare Other | Attending: Physical Medicine & Rehabilitation | Admitting: Physical Medicine & Rehabilitation

## 2019-10-16 ENCOUNTER — Other Ambulatory Visit: Payer: Self-pay

## 2019-10-16 ENCOUNTER — Encounter: Payer: Self-pay | Admitting: Physical Medicine & Rehabilitation

## 2019-10-16 VITALS — BP 126/68 | HR 68 | Temp 98.0°F

## 2019-10-16 DIAGNOSIS — Z5181 Encounter for therapeutic drug level monitoring: Secondary | ICD-10-CM | POA: Diagnosis present

## 2019-10-16 DIAGNOSIS — G894 Chronic pain syndrome: Secondary | ICD-10-CM | POA: Diagnosis not present

## 2019-10-16 DIAGNOSIS — M1611 Unilateral primary osteoarthritis, right hip: Secondary | ICD-10-CM | POA: Insufficient documentation

## 2019-10-16 DIAGNOSIS — Z79891 Long term (current) use of opiate analgesic: Secondary | ICD-10-CM

## 2019-10-16 NOTE — Patient Instructions (Signed)
May need to gradually increase strength of patch

## 2019-10-16 NOTE — Progress Notes (Addendum)
Subjective:    Patient ID: Bobby Lane, male    DOB: [birth year], 79 y.o.   MRN: [id number]  HPI CC:  Pain in Right hip, knee and ankle 79 year old male with history of nephrolithiasis, cervical radiculopathy, coronary artery disease referred by his primary care provider for the evaluation of chronic widespread body pain. The patient states that he hurts all over. He states that he has been taking Aleve over-the-counter for a long time. Reviewed renal function tests 07/14/2019, creatinine 1.56 which was up from 1.01 on 01/05/2019. The patient underwent cervical decompression and fusion C3-C7 by Dr. Vertell Limber on 07/16/2019. Patient states that this did help with the tingling and pain shooting down his arms. Drops RIght shoulder when he is reaching. Patient ambulates short distances with a cane, has been in wheelchair for community distances  Bowel ok, + bladder incont   Has been seen by orthopedics in the past per patient report but I do not see any records of this in epic. He was seen at the [facility] spine center by physical medicine rehab for his cervical radiculopathy.  The patient indicates that orthopedics recommended surgery for hip on right but pt refused because he has friends that did not do well postoperatively. RIght carpal tunnel surgery  DATA:  Right hip pain after fall.  EXAM: DG HIP (WITH OR WITHOUT PELVIS) 2-3V RIGHT  COMPARISON:  05/21/2017 abdomen radiographs  FINDINGS: Bone-on-bone apposition of the weight-bearing portion of the right femoral head with acetabulum is identified consistent with advanced osteoarthritic change of the right hip. Further progression of joint space narrowing is noted since prior exam. Subchondral cystic lucencies of the acetabular roof and femoral head with subchondral sclerosis is identified of the right hip, also further supporting osteoarthritis. AVN of the right femoral head is believed less likely given involvement of the acetabulum as well. The  native left hip demonstrates moderate joint space narrowing with spherical appearance of the left femoral head. Lumbar spondylosis with multilevel degenerative disc and facet arthropathy is seen. No pelvic diastasis or fracture.  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 Further progression in osteoarthritis is noted since the 2019 comparison. No acute osseous abnormality.   Electronically Signed   By: Ashley Royalty M.D.   On: 04/02/2018 00:30 Pain Inventory Average Pain 8 Pain Right Now 8 My pain is burning and aching  In the last 24 hours, has pain interfered with the following? General activity 10 Relation with others 10 Enjoyment of life 10 What TIME of day is your pain at its worst? night Sleep (in general) Poor  Pain is worse with: walking Pain improves with: medication Relief from Meds: 0  walk with assistance use a cane use a walker ability to climb steps?  yes do you drive?  yes use a wheelchair  retired I need assistance with the following:  household duties Do you have any goals in this area?  yes  bladder control problems trouble walking spasms  new  new    Family History  Problem Relation Age of Onset  . Brain cancer Father   . Prostate cancer Neg Hx   . Bladder Cancer Neg Hx   . Kidney cancer Neg Hx    Social History   Socioeconomic History  . Marital status: Single    Spouse name: Not on file  . Number of children: Not on file  . Years of education: Not  on file  . Highest education level: Not on file  Occupational History  . Not on file  Tobacco Use  . Smoking status: Never Smoker  . Smokeless tobacco: Never Used  Vaping Use  . Vaping Use: Never used  Substance and Sexual Activity  . Alcohol use: No  . Drug use: No  . Sexual activity: Not on file  Other Topics Concern  . Not on file  Social History Narrative    . Not on file   Social Determinants of Health   Financial Resource Strain:   . Difficulty of Paying Living Expenses: Not on file  Food Insecurity:   . Worried About Charity fundraiser in the Last Year: Not on file  . Ran Out of Food in the Last Year: Not on file  Transportation Needs:   . Lack of Transportation (Medical): Not on file  . Lack of Transportation (Non-Medical): Not on file  Physical Activity:   . Days of Exercise per Week: Not on file  . Minutes of Exercise per Session: Not on file  Stress:   . Feeling of Stress : Not on file  Social Connections:   . Frequency of Communication with Friends and Family: Not on file  . Frequency of Social Gatherings with Friends and Family: Not on file  . Attends Religious Services: Not on file  . Active Member of Clubs or Organizations: Not on file  . Attends Archivist Meetings: Not on file  . Marital Status: Not on file   Past Surgical History:  Procedure Laterality Date  . ANTERIOR CERVICAL DECOMP/DISCECTOMY FUSION N/A 07/16/2019   Procedure: Cervical three-four Cervical five-six Cervical six-seven Anterior cervical decompression/discectomy/fusion;  Surgeon: Erline Levine, MD;  Location: [place];  Service: Neurosurgery;  Laterality: N/A;  3C  . CARDIAC CATHETERIZATION    . CARPAL TUNNEL RELEASE Left 07/16/2019   Procedure: LEFT CARPAL TUNNEL RELEASE;  Surgeon: Erline Levine, MD;  Location: [place];  Service: Neurosurgery;  Laterality: Left;  . CATARACT EXTRACTION W/PHACO Right 08/21/2016   Procedure: CATARACT EXTRACTION PHACO AND INTRAOCULAR LENS PLACEMENT (IOC);  Surgeon: Birder Robson, MD;  Location: [facility];  Service: Ophthalmology;  Laterality: Right;  [place] 00:54.9 AP% 18.3 CDE 10.04 Fluid pack lot # [id number]  . CATARACT EXTRACTION W/PHACO Left 09/18/2016   Procedure: CATARACT EXTRACTION PHACO AND INTRAOCULAR LENS PLACEMENT (IOC);  Surgeon: Birder Robson, MD;  Location: [facility];  Service: Ophthalmology;  Laterality:  Left;  [place] 00:56 AP% 19.7 CDE 11.07 Fluid pack lot # [id number]  . FRACTURE SURGERY     ANKLE  . HERNIA REPAIR    . UPPER GI ENDOSCOPY     Past Medical History:  Diagnosis Date  . Arthritis   . Cancer ([place])    SKIN  . COPD (chronic obstructive pulmonary disease) ([place])   . Coronary artery disease   . Depression   . Dyspnea    DOE  . GERD (gastroesophageal reflux disease)   . Gout   . Heart murmur   . History of hiatal hernia   . History of kidney stones   . History of wheezing   . HOH (hard of hearing)   . Hypertension   . Hypothyroidism   . Oxygen deficit    PRN USE; used ~ 2014 for less than a year (no longer has as of 07/15/19)  . Pneumonia   . Renal disorder   . Thyroid disease    BP 126/68   Pulse 68   Temp  28 F (36.7 C)   SpO2 94%   Opioid Risk Score:   Fall Risk Score:  `1  Depression screen PHQ 2/9  Depression screen PHQ 2/9 01/26/2019  Decreased Interest 0  Down, Depressed, Hopeless 0  PHQ - 2 Score 0  Altered sleeping 1  Tired, decreased energy 1  Change in appetite 2  Feeling bad or failure about yourself  0  Trouble concentrating 0  Moving slowly or fidgety/restless 0  Suicidal thoughts 0  PHQ-9 Score 4    Review of Systems  Constitutional: Negative.   HENT: Positive for hearing loss.   Eyes: Negative.   Respiratory: Negative.   Cardiovascular: Negative.   Gastrointestinal: Negative.   Genitourinary: Positive for difficulty urinating.  Musculoskeletal: Positive for arthralgias, back pain and gait problem.       Spasms   Skin: Negative.   Allergic/Immunologic: Negative.   Hematological: Negative.   Psychiatric/Behavioral: Negative.   All other systems reviewed and are negative.      Objective:   Physical Exam Vitals and nursing note reviewed.  Constitutional:      Appearance: Normal appearance.  HENT:     Head: Normocephalic and atraumatic.  Eyes:     Extraocular Movements: Extraocular movements intact.      Conjunctiva/sclera: Conjunctivae normal.     Pupils: Pupils are equal, round, and reactive to light.  Cardiovascular:     Rate and Rhythm: Normal rate and regular rhythm.     Heart sounds: Normal heart sounds. No murmur heard.   Pulmonary:     Effort: No respiratory distress.     Breath sounds: Normal breath sounds. No stridor. No wheezing.  Abdominal:     General: Abdomen is flat. Bowel sounds are normal. There is no distension.     Palpations: Abdomen is soft. There is no mass.     Tenderness: There is no abdominal tenderness.  Musculoskeletal:     Cervical back: Rigidity present. No tenderness.     Comments: Limited right shoulder range of motion no crepitus he has limited external rotation as well as abduction and forward flexion. Negative drop arm test. Does have shoulder pain with forward reaching.  Neurological:     Mental Status: He is alert and oriented to person, place, and time.     Cranial Nerves: No dysarthria.     Motor: No weakness.     Coordination: Coordination is intact.     Comments: Patient in wheelchair has limited ambulation  Psychiatric:        Mood and Affect: Mood normal.        Behavior: Behavior normal.   Motor strength 3 - at the right deltoid 4 at the biceps triceps grip bilaterally 4 at the left deltoid 4 at the hip flexor knee extensor ankle dorsiflexors bilaterally Negative straight leg raise His hip has 0 internal/external rotation and minimal hip abduction and adduction. He has pain with medial joint line palpation bilateral knees. No evidence of knee effusion. No evidence of wrist effusions.        Assessment & Plan:  #1. Right hip pain end-stage osteoarthritis. Patient states that he has refused surgery although he would likely do well with it. He may benefit from orthopedic reevaluation. In terms of medications would avoid nonsteroidal anti-inflammatories. He has multiple drug allergies including oxycodone morphine, codeine and tramadol all of  which cause nausea and vomiting. He received some hydrocodone postoperatively from Dr. Vertell Limber which he states he tolerated okay. Patient does have an  indication for narcotic analgesics but given his age and overall frailty would avoid sedating agents. Would check toxicology before initiating. Opioid risk is 1 PDQ 9 score is 7 If toxicology is appropriate i.e. no illicit drugs or undisclosed opiates/benzodiazepines would trial with Butrans patch  UDS shows no illicit drugs or nondisclosed opiates, will start Butrans patch 10 mcg may need to titrate upward

## 2019-10-20 LAB — DRUG TOX MONITOR 1 W/CONF, ORAL FLD

## 2019-10-20 LAB — DRUG TOX ALC METAB W/CON, ORAL FLD: Alcohol Metabolite: NEGATIVE ng/mL (ref <25)

## 2019-10-22 ENCOUNTER — Telehealth: Payer: Self-pay | Admitting: *Deleted

## 2019-10-22 NOTE — Telephone Encounter (Signed)
Patient left a message (twice) stating he was told to call today about his UDS results and possibly getting his script for [facility] patch sent in.   His UDS is negative for alcohol and illicit drugs.  He is asking for his script for Butrans to be sent in to his pharmacy

## 2019-10-23 ENCOUNTER — Telehealth: Payer: Self-pay

## 2019-10-23 ENCOUNTER — Other Ambulatory Visit: Payer: Medicare Other

## 2019-10-23 MED ORDER — BUPRENORPHINE 10 MCG/HR TD PTWK
1.0000 | MEDICATED_PATCH | TRANSDERMAL | 1 refills | Status: DC
Start: 2019-10-23 — End: 2020-03-25

## 2019-10-23 MED ORDER — BUPRENORPHINE 10 MCG/HR TD PTWK
10.0000 | MEDICATED_PATCH | TRANSDERMAL | 1 refills | Status: DC
Start: 2019-10-23 — End: 2019-10-23

## 2019-10-23 MED ORDER — BUPRENORPHINE 10 MCG/HR TD PTWK: 1.0000 | MEDICATED_PATCH | TRANSDERMAL | 1 refills | Status: DC

## 2019-10-23 NOTE — Telephone Encounter (Signed)
[facility] called stating that they don't have Butrans patch in stock but [facility] does can it be send there.

## 2019-10-23 NOTE — Addendum Note (Signed)
Addended by: Charlett Blake on: 10/23/2019 03:57 PM   Modules accepted: Orders

## 2019-10-23 NOTE — Addendum Note (Signed)
Addended by: Charlett Blake on: 10/23/2019 12:25 PM   Modules accepted: Orders

## 2019-10-23 NOTE — Addendum Note (Signed)
Addended by: Charlett Blake on: 10/23/2019 04:11 PM   Modules accepted: Orders

## 2019-10-26 NOTE — Progress Notes (Signed)
covid test scheduled for today at [facility].    Patient asked if he could postpone surgery until October.  Patient instructed to call Dr. Melven Sartorius office.  Patient verbalized understanding.

## 2019-10-26 NOTE — Telephone Encounter (Signed)
[facility] [address] called and cancelled prescription.

## 2019-10-27 ENCOUNTER — Other Ambulatory Visit: Payer: Medicare Other | Attending: Neurosurgery

## 2019-10-27 SURGERY — CARPAL TUNNEL RELEASE
Anesthesia: Monitor Anesthesia Care | Laterality: Left

## 2019-10-28 ENCOUNTER — Telehealth: Payer: Self-pay | Admitting: *Deleted

## 2019-10-28 NOTE — Telephone Encounter (Signed)
Oral swab drug screen was appropriately negative for controlled substances.

## 2019-11-02 ENCOUNTER — Telehealth: Payer: Self-pay

## 2019-11-02 DIAGNOSIS — M25511 Pain in right shoulder: Secondary | ICD-10-CM

## 2019-11-02 NOTE — Telephone Encounter (Signed)
Patient called stating pain patch is not working and if he can get a return call after 11:00 am. I called patient back and no answer, left message.

## 2019-11-02 NOTE — Telephone Encounter (Signed)
Patient called back stating that the patches is making his legs heard worse. Requesting referral to surgeon for his shoulder.

## 2019-11-04 NOTE — Telephone Encounter (Signed)
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

## 2019-11-06 NOTE — Telephone Encounter (Signed)
May make referral to Ortho care first available for right shoulder pain

## 2019-11-06 NOTE — Telephone Encounter (Signed)
Referral placed.

## 2019-11-06 NOTE — Telephone Encounter (Signed)
I spoke with Bobby Lane.  He reports that he cannot use the patch, it makes the pain worse. He said the referral is for he right shoulder.

## 2019-11-13 ENCOUNTER — Encounter: Payer: Medicare Other | Attending: Registered Nurse | Admitting: Registered Nurse

## 2019-11-13 ENCOUNTER — Encounter: Payer: Self-pay | Admitting: Registered Nurse

## 2019-11-13 ENCOUNTER — Other Ambulatory Visit: Payer: Self-pay

## 2019-11-13 VITALS — BP 135/73 | HR 73 | Temp 98.6°F | Ht 66.0 in

## 2019-11-13 DIAGNOSIS — G894 Chronic pain syndrome: Secondary | ICD-10-CM | POA: Diagnosis present

## 2019-11-13 DIAGNOSIS — M25511 Pain in right shoulder: Secondary | ICD-10-CM | POA: Insufficient documentation

## 2019-11-13 DIAGNOSIS — M1611 Unilateral primary osteoarthritis, right hip: Secondary | ICD-10-CM | POA: Diagnosis present

## 2019-11-13 DIAGNOSIS — Z79899 Other long term (current) drug therapy: Secondary | ICD-10-CM | POA: Diagnosis present

## 2019-11-13 DIAGNOSIS — Z5181 Encounter for therapeutic drug level monitoring: Secondary | ICD-10-CM | POA: Diagnosis present

## 2019-11-13 MED ORDER — HYDROCODONE-ACETAMINOPHEN 5-325 MG PO TABS: 1.0000 | ORAL_TABLET | Freq: Two times a day (BID) | ORAL | 0 refills | Status: DC | PRN

## 2019-11-13 NOTE — Progress Notes (Signed)
Subjective:    Patient ID: Bobby Lane, male    DOB: June 10, 1940, 79 y.o.   MRN: [id number]  HPI: Bobby Lane is a 79 y.o. male who returns for follow up appointment for chronic pain and medication refill. He states his  pain is located in his right shoulder and right hip. Also reports occassionally left hip pain and muscle spasms in his bilateral lower extremities. He rates his  Pain 10. He states the Butrans patch didn't offer him any relief of his pain, he was instructed to placed two patches, he states he didn't put on two patches due to the fact he thought it would be ineffective. Also reports he threw the Butrans patches in the garbage, he was instructed on the proper disposal of narcotics, he verbalizes understanding. The PMP was reviewed, in the past he was prescribed hydrocodone without any adverse reaction, also Bobby Lane stated he didn't have any adverse reaction with the hydrocodone, the above was discussed with Dr Letta Pate and he agreed with the plan. This was discussed with Bobby Lane and he agree with the plan as well, we will prescribe Hydrocodone today, he verbalizes understanding. His  current exercise regime is walking in his home with his walker or canes he states.   Bobby Lane Morphine equivalent will be 10.00 MME, with the Hydrocodone. Last Oral Swab was Performed on 10/16/2019, it was consistent, negative for controlled substance.   Pain Inventory Average Pain 10 Pain Right Now 10 My pain is sharp  In the last 24 hours, has pain interfered with the following? General activity 10 Relation with others 10 Enjoyment of life 10 What TIME of day is your pain at its worst? varies Sleep (in general) varies  Pain is worse with: walking and standing Pain improves with: rest and medication Relief from Meds: 0  Family History  Problem Relation Age of Onset  . Brain cancer Father   . Prostate cancer Neg Hx   . Bladder Cancer Neg Hx   . Kidney cancer Neg Hx    Social History     Socioeconomic History  . Marital status: Single    Spouse name: Not on file  . Number of children: Not on file  . Years of education: Not on file  . Highest education level: Not on file  Occupational History  . Not on file  Tobacco Use  . Smoking status: Never Smoker  . Smokeless tobacco: Never Used  Vaping Use  . Vaping Use: Never used  Substance and Sexual Activity  . Alcohol use: No  . Drug use: No  . Sexual activity: Not on file  Other Topics Concern  . Not on file  Social History Narrative  . Not on file   Social Determinants of Health   Financial Resource Strain:   . Difficulty of Paying Living Expenses: Not on file  Food Insecurity:   . Worried About Charity fundraiser in the Last Year: Not on file  . Ran Out of Food in the Last Year: Not on file  Transportation Needs:   . Lack of Transportation (Medical): Not on file  . Lack of Transportation (Non-Medical): Not on file  Physical Activity:   . Days of Exercise per Week: Not on file  . Minutes of Exercise per Session: Not on file  Stress:   . Feeling of Stress : Not on file  Social Connections:   . Frequency of Communication with Friends and Family: Not on file  .  Frequency of Social Gatherings with Friends and Family: Not on file  . Attends Religious Services: Not on file  . Active Member of Clubs or Organizations: Not on file  . Attends Archivist Meetings: Not on file  . Marital Status: Not on file   Past Surgical History:  Procedure Laterality Date  . ANTERIOR CERVICAL DECOMP/DISCECTOMY FUSION N/A 07/16/2019   Procedure: Cervical three-four Cervical five-six Cervical six-seven Anterior cervical decompression/discectomy/fusion;  Surgeon: Erline Levine, MD;  Location: [place];  Service: Neurosurgery;  Laterality: N/A;  3C  . CARDIAC CATHETERIZATION    . CARPAL TUNNEL RELEASE Left 07/16/2019   Procedure: LEFT CARPAL TUNNEL RELEASE;  Surgeon: Erline Levine, MD;  Location: [place];  Service:  Neurosurgery;  Laterality: Left;  . CATARACT EXTRACTION W/PHACO Right 08/21/2016   Procedure: CATARACT EXTRACTION PHACO AND INTRAOCULAR LENS PLACEMENT (IOC);  Surgeon: Birder Robson, MD;  Location: [facility];  Service: Ophthalmology;  Laterality: Right;  [place] 00:54.9 AP% 18.3 CDE 10.04 Fluid pack lot # [id number]  . CATARACT EXTRACTION W/PHACO Left 09/18/2016   Procedure: CATARACT EXTRACTION PHACO AND INTRAOCULAR LENS PLACEMENT (IOC);  Surgeon: Birder Robson, MD;  Location: [facility];  Service: Ophthalmology;  Laterality: Left;  [place] 00:56 AP% 19.7 CDE 11.07 Fluid pack lot # [id number]  . FRACTURE SURGERY     ANKLE  . HERNIA REPAIR    . UPPER GI ENDOSCOPY     Past Surgical History:  Procedure Laterality Date  . ANTERIOR CERVICAL DECOMP/DISCECTOMY FUSION N/A 07/16/2019   Procedure: Cervical three-four Cervical five-six Cervical six-seven Anterior cervical decompression/discectomy/fusion;  Surgeon: Erline Levine, MD;  Location: [place];  Service: Neurosurgery;  Laterality: N/A;  3C  . CARDIAC CATHETERIZATION    . CARPAL TUNNEL RELEASE Left 07/16/2019   Procedure: LEFT CARPAL TUNNEL RELEASE;  Surgeon: Erline Levine, MD;  Location: [place];  Service: Neurosurgery;  Laterality: Left;  . CATARACT EXTRACTION W/PHACO Right 08/21/2016   Procedure: CATARACT EXTRACTION PHACO AND INTRAOCULAR LENS PLACEMENT (IOC);  Surgeon: Birder Robson, MD;  Location: [facility];  Service: Ophthalmology;  Laterality: Right;  [place] 00:54.9 AP% 18.3 CDE 10.04 Fluid pack lot # [id number]  . CATARACT EXTRACTION W/PHACO Left 09/18/2016   Procedure: CATARACT EXTRACTION PHACO AND INTRAOCULAR LENS PLACEMENT (IOC);  Surgeon: Birder Robson, MD;  Location: [facility];  Service: Ophthalmology;  Laterality: Left;  [place] 00:56 AP% 19.7 CDE 11.07 Fluid pack lot # [id number]  . FRACTURE SURGERY     ANKLE  . HERNIA REPAIR    . UPPER GI ENDOSCOPY     Past Medical History:  Diagnosis Date  . Arthritis   . Cancer ([place])    SKIN  . COPD  (chronic obstructive pulmonary disease) ([place])   . Coronary artery disease   . Depression   . Dyspnea    DOE  . GERD (gastroesophageal reflux disease)   . Gout   . Heart murmur   . History of hiatal hernia   . History of kidney stones   . History of wheezing   . HOH (hard of hearing)   . Hypertension   . Hypothyroidism   . Oxygen deficit    PRN USE; used ~ 2014 for less than a year (no longer has as of 07/15/19)  . Pneumonia   . Renal disorder   . Thyroid disease    BP 135/73   Pulse 73   Temp 98.6 F (37 C)   Ht 5\' 6"  (1.676 m)   SpO2 93%   BMI 33.73 kg/m  Opioid Risk Score:   Fall Risk Score:  `1  Depression screen PHQ 2/9  Depression screen [facility] 2/9 10/16/2019 01/26/2019  Decreased Interest 1 0  Down, Depressed, Hopeless 1 0  PHQ - 2 Score 2 0  Altered sleeping 1 1  Tired, decreased energy 1 1  Change in appetite 2 2  Feeling bad or failure about yourself  0 0  Trouble concentrating 1 0  Moving slowly or fidgety/restless 0 0  Suicidal thoughts 0 0  PHQ-9 Score 7 4  Difficult doing work/chores Somewhat difficult -    Review of Systems  Constitutional: Negative.   HENT: Negative.   Eyes: Negative.   Respiratory: Negative.   Cardiovascular: Negative.   Gastrointestinal: Negative.   Endocrine: Negative.   Genitourinary: Negative.   Musculoskeletal: Positive for gait problem.  Skin: Negative.   Allergic/Immunologic: Negative.   Hematological: Negative.   Psychiatric/Behavioral: Negative.   All other systems reviewed and are negative.      Objective:   Physical Exam Vitals and nursing note reviewed.  Constitutional:      Appearance: Normal appearance.  Cardiovascular:     Rate and Rhythm: Normal rate and regular rhythm.     Pulses: Normal pulses.     Heart sounds: Normal heart sounds.  Pulmonary:     Effort: Pulmonary effort is normal.     Breath sounds: Normal breath sounds.  Musculoskeletal:     Cervical back: Normal range of motion and neck  supple.     Comments: Normal Muscle Bulk and Muscle Testing Reveals:  Upper Extremities: Right: Decreased ROM 45 Degrees  and Muscle Strength 5/5 Left Upper Extremity: Full ROM and Muscle Strength 5/5 Lower Extremities: Decreased ROM and Muscle Strength 5/5 Bilateral Lower Extremities Flexion Produces Pain into Bilateral Lower Extremities.  Arrived in wheelchair  Skin:    General: Skin is warm and dry.  Neurological:     Mental Status: He is alert and oriented to person, place, and time.  Psychiatric:        Mood and Affect: Mood normal.        Behavior: Behavior normal.           Assessment & Plan:  1. Right Shoulder Pain: He has an appointment with Orthopedist on 11/23/2019, he reports. Continue to Monitor.  2. Right Hip Osteoarthritis: Continue HEP as Tolerated. Continue to Monitor.  3. Chronic Pain Syndrome: Instructed to call office in two weeks to evaluate medication change, he verbalizes understanding. RX: Hydrocodone 5 mg twice a day as needed for pain. Butrans discontinued ineffective.  We will continue the opioid monitoring program, this consists of regular clinic visits, examinations, urine drug screen, pill counts as well as use of [place] Controlled Substance Reporting system. A 12 month History has been reviewed on the [place] on 11/13/2019.   20 minutes of face to face patient care time was spent during this visit. All questions were encouraged and answered.  F/U in 1 month

## 2019-11-13 NOTE — Patient Instructions (Signed)
Call Office in Two Weeks to evaluate  Medication Change:   [phone]

## 2019-11-20 ENCOUNTER — Telehealth: Payer: Self-pay | Admitting: *Deleted

## 2019-11-20 NOTE — Telephone Encounter (Signed)
Bobby Lane called and said the medication is not working and needs something stronger.  Please advise.

## 2019-11-23 ENCOUNTER — Ambulatory Visit: Payer: Medicare Other | Admitting: Orthopedic Surgery

## 2019-11-23 NOTE — Telephone Encounter (Signed)
Return Mr. Stcyr call, he states he's  not receiving any relief of his pain with his current medication regimen. He was asked to count his Hydrocodone, he reports he has 14 tablets. PMP was reviewed, his Hydrocodone was filled on 11/13/2019, he admits to taking his Hydrocodone 3-4 tablets a day. He was educated on the narcotic policy, this will be discussed with Dr Letta Pate. I explained to Mr. Grissinger this can lead to being discharged from our office he verbalizes understanding.

## 2019-11-27 ENCOUNTER — Telehealth: Payer: Self-pay | Admitting: Registered Nurse

## 2019-11-27 NOTE — Telephone Encounter (Signed)
Discussed with Dr Letta Pate ,  How Mr. Pudlo was taking his Hydrocodone, he will allow him to take the hydrocodne three times a day as needed for pain. If he doesn't follow the narcotic contract and takke his medication as prescribed he will be discharged. Placed a call to Mr. Blew, no answer. Left message for Mr. Williamsen to return the call.

## 2019-12-02 ENCOUNTER — Ambulatory Visit: Payer: Medicare Other | Admitting: Orthopedic Surgery

## 2019-12-14 ENCOUNTER — Encounter: Payer: Medicare Other | Attending: Registered Nurse | Admitting: Registered Nurse

## 2019-12-14 DIAGNOSIS — G894 Chronic pain syndrome: Secondary | ICD-10-CM | POA: Insufficient documentation

## 2019-12-14 DIAGNOSIS — Z79899 Other long term (current) drug therapy: Secondary | ICD-10-CM | POA: Insufficient documentation

## 2019-12-14 DIAGNOSIS — Z5181 Encounter for therapeutic drug level monitoring: Secondary | ICD-10-CM | POA: Insufficient documentation

## 2019-12-14 DIAGNOSIS — M1611 Unilateral primary osteoarthritis, right hip: Secondary | ICD-10-CM | POA: Insufficient documentation

## 2019-12-14 DIAGNOSIS — M25511 Pain in right shoulder: Secondary | ICD-10-CM | POA: Insufficient documentation

## 2020-01-04 ENCOUNTER — Encounter: Payer: Self-pay | Admitting: Podiatry

## 2020-01-04 ENCOUNTER — Ambulatory Visit (INDEPENDENT_AMBULATORY_CARE_PROVIDER_SITE_OTHER): Payer: Medicare Other | Admitting: Podiatry

## 2020-01-04 ENCOUNTER — Other Ambulatory Visit: Payer: Self-pay

## 2020-01-04 DIAGNOSIS — N289 Disorder of kidney and ureter, unspecified: Secondary | ICD-10-CM | POA: Diagnosis not present

## 2020-01-04 DIAGNOSIS — B351 Tinea unguium: Secondary | ICD-10-CM | POA: Diagnosis not present

## 2020-01-04 DIAGNOSIS — M79675 Pain in left toe(s): Secondary | ICD-10-CM | POA: Diagnosis not present

## 2020-01-04 DIAGNOSIS — M79674 Pain in right toe(s): Secondary | ICD-10-CM | POA: Diagnosis not present

## 2020-01-04 NOTE — Progress Notes (Signed)
This patient returns to my office for at risk foot care.  This patient requires this care by a professional since this patient will be at risk due to having renal disease.  This patient is unable to cut nails himself  since the patient cannot reach his  nails.These nails are painful walking and wearing shoes.  This patient presents for at risk foot care today.    General Appearance  Alert, conversant and in no acute stress.  Vascular  Dorsalis pedis and posterior tibial  pulses are not  palpable  bilaterally.  Capillary return is within normal limits  bilaterally. Temperature is within normal limits  bilaterally.  Neurologic  Senn-Weinstein monofilament wire test within normal limits  bilaterally. Muscle power within normal limits bilaterally.  Nails Thick disfigured discolored nails with subungual debris  from hallux to fifth toes bilaterally. No evidence of bacterial infection or drainage bilaterally.  Orthopedic  No limitations of motion  feet .  No crepitus or effusions noted.  No bony pathology or digital deformities noted.  Skin  normotropic skin with no porokeratosis noted bilaterally.  No signs of infections or ulcers noted.     Onychomycosis  Pain in right toes  Pain in left toes  Consent was obtained for treatment procedures.   Mechanical debridement of nails 1-5  bilaterally performed with a nail nipper.  Filed with dremel without incident. No infection or ulcer.     Return office visit   3 months        Told patient to return for periodic foot care and evaluation due to potential at risk complications.   Gardiner Barefoot DPM

## 2020-03-14 ENCOUNTER — Telehealth: Payer: Self-pay

## 2020-03-14 NOTE — Telephone Encounter (Signed)
NOTES ON FILE FROM [facility] [phone], SENT REFERRAL TO SCHEDULING 

## 2020-03-16 ENCOUNTER — Ambulatory Visit (HOSPITAL_COMMUNITY): Admission: RE | Admit: 2020-03-16 | Payer: Medicare Other | Source: Ambulatory Visit | Admitting: Neurosurgery

## 2020-03-22 NOTE — Progress Notes (Signed)
Cardiology Office Note:    Date:  03/25/2020   ID:  Bobby Lane, DOB 1940/05/13, MRN [id number]  PCP:  Jon Billings, NP  [facility] Cardiologist:  No primary care provider on file.  [facility] Electrophysiologist:  None   Referring MD: Arthur Holms, NP     History of Present Illness:    Bobby Lane is a 80 y.o. male with a hx of COPD, CAD, gout, HTN, and GERD who was referred by Arthur Holms, NP for further evaluation of palpitations.   Patient saw PCP recently for the sensation that his heart was "jumping in his chest." Had associated lightheadedness, but no chest pain, Sob or syncope. Had been off his metoprolol as he did not fill his prescription. Given symptoms, he was referred to Cardiology for further management.  Today, the patient states that he had bad palpitations back at the end of January. Felt like a pounding in his chest. Had some lightheadedness with this. Tried to rest and the symptoms eventually improved. He went to [facility] a couple days later where medical students where checking people's heart rates and he was told his ? rate was 140s and his blood pressure was elevated. They wanted to call EMS but he declined. He denies any current palpitations but states he sometimes feels like his heart skips a beat. No chest pain, nausea, vomiting, orthopnea or PND. Has reported history of CAD but no caths in our system. Said in his problem list that he had paroxysmal Afib, but patient is unaware of ever having an arrhythmia. He is not on [facility].   Past Medical History:  Diagnosis Date  . Arthritis   . Cancer ([place])    SKIN  . COPD (chronic obstructive pulmonary disease) ([place])   . Coronary artery disease   . Depression   . Dyspnea    DOE  . GERD (gastroesophageal reflux disease)   . Gout   . Heart murmur   . History of hiatal hernia   . History of kidney stones   . History of wheezing   . HOH (hard of hearing)   . Hypertension   . Hypothyroidism   . Oxygen  deficit    PRN USE; used ~ 2014 for less than a year (no longer has as of 07/15/19)  . Pneumonia   . Renal disorder   . Thyroid disease     Past Surgical History:  Procedure Laterality Date  . ANTERIOR CERVICAL DECOMP/DISCECTOMY FUSION N/A 07/16/2019   Procedure: Cervical three-four Cervical five-six Cervical six-seven Anterior cervical decompression/discectomy/fusion;  Surgeon: Erline Levine, MD;  Location: [place];  Service: Neurosurgery;  Laterality: N/A;  3C  . CARDIAC CATHETERIZATION    . CARPAL TUNNEL RELEASE Left 07/16/2019   Procedure: LEFT CARPAL TUNNEL RELEASE;  Surgeon: Erline Levine, MD;  Location: [place];  Service: Neurosurgery;  Laterality: Left;  . CATARACT EXTRACTION W/PHACO Right 08/21/2016   Procedure: CATARACT EXTRACTION PHACO AND INTRAOCULAR LENS PLACEMENT (IOC);  Surgeon: Birder Robson, MD;  Location: [facility];  Service: Ophthalmology;  Laterality: Right;  [place] 00:54.9 AP% 18.3 CDE 10.04 Fluid pack lot # [id number]  . CATARACT EXTRACTION W/PHACO Left 09/18/2016   Procedure: CATARACT EXTRACTION PHACO AND INTRAOCULAR LENS PLACEMENT (IOC);  Surgeon: Birder Robson, MD;  Location: [facility];  Service: Ophthalmology;  Laterality: Left;  [place] 00:56 AP% 19.7 CDE 11.07 Fluid pack lot # [id number]  . FRACTURE SURGERY     ANKLE  . HERNIA REPAIR    . UPPER GI  ENDOSCOPY      Current Medications: Current Meds  Medication Sig  . acetaminophen (TYLENOL) 325 MG tablet Take 2 tablets (650 mg total) by mouth every 8 (eight) hours.  Marland Kitchen acetaminophen (TYLENOL) 650 MG CR tablet Take 1,300 mg by mouth every 8 (eight) hours as needed for pain.  Marland Kitchen allopurinol (ZYLOPRIM) 300 MG tablet TAKE 1 TABLET(300 MG) BY MOUTH DAILY  . atorvastatin (LIPITOR) 20 MG tablet Take 20 mg by mouth daily.  . B Complex-C (SUPER B COMPLEX PO) Take 1 tablet by mouth daily.  . Cholecalciferol (VITAMIN D3) 5000 UNITS TABS Take 5,000 Units by mouth daily.  . diclofenac Sodium (VOLTAREN) 1 % GEL diclofenac 1 % topical gel   . DULoxetine (CYMBALTA) 30 MG capsule TAKE ONE CAPSULE BY MOUTH TWICE DAILY  . finasteride (PROSCAR) 5 MG tablet Take 1 tablet (5 mg total) by mouth daily.  Marland Kitchen HYDROcodone-acetaminophen (NORCO) 10-325 MG tablet   . levothyroxine (SYNTHROID) 100 MCG tablet Synthroid 100 mcg tablet  . losartan (COZAAR) 100 MG tablet Take 1 tablet (100 mg total) by mouth daily.  . metoprolol succinate (TOPROL-XL) 100 MG 24 hr tablet TAKE 1 TABLET(100 MG) BY MOUTH DAILY  . pregabalin (LYRICA) 150 MG capsule Take 1 capsule (150 mg total) by mouth 2 (two) times daily.  Marland Kitchen spironolactone (ALDACTONE) 25 MG tablet Take 1 tablet (25 mg total) by mouth daily.  . tamsulosin (FLOMAX) 0.4 MG CAPS capsule Take 1 capsule (0.4 mg total) by mouth daily.  . [DISCONTINUED] buprenorphine (BUTRANS) 10 MCG/HR PTWK Place 1 patch onto the skin once a week.  . [DISCONTINUED] DULoxetine (CYMBALTA) 30 MG capsule Take 1 capsule (30 mg total) by mouth 2 (two) times daily.     Allergies:   Codeine, Morphine and related, Novocain [procaine], Other, Percocet [oxycodone-acetaminophen], Sulfa antibiotics, and Tramadol   Social History   Socioeconomic History  . Marital status: Single    Spouse name: Not on file  . Number of children: Not on file  . Years of education: Not on file  . Highest education level: Not on file  Occupational History  . Not on file  Tobacco Use  . Smoking status: Never Smoker  . Smokeless tobacco: Never Used  Vaping Use  . Vaping Use: Never used  Substance and Sexual Activity  . Alcohol use: No  . Drug use: No  . Sexual activity: Not on file  Other Topics Concern  . Not on file  Social History Narrative  . Not on file   Social Determinants of Health   Financial Resource Strain: Not on file  Food Insecurity: Not on file  Transportation Needs: Not on file  Physical Activity: Not on file  Stress: Not on file  Social Connections: Not on file     Family History: The patient's family history includes  Brain cancer in his father. There is no history of Prostate cancer, Bladder Cancer, or Kidney cancer.  ROS:   Please see the history of present illness.    Review of Systems  Constitutional: Negative for chills and fever.  HENT: Negative for nosebleeds.   Eyes: Negative for blurred vision and redness.  Respiratory: Negative for shortness of breath.   Cardiovascular: Positive for palpitations. Negative for chest pain, orthopnea, claudication, leg swelling and PND.  Gastrointestinal: Negative for nausea and vomiting.  Genitourinary: Negative for dysuria and hematuria.  Musculoskeletal: Positive for joint pain.  Neurological: Positive for dizziness. Negative for speech change and loss of consciousness.  Endo/Heme/Allergies: Negative for  polydipsia.    EKGs/Labs/Other Studies Reviewed:    The following studies were reviewed today: TTE 27-Jun-2013: ------------------------------------------------------------  Left ventricle: The cavity size was normal. Wall thickness  was increased in a pattern of mild LVH. There was focal  basal hypertrophy. Systolic function was normal. The  estimated ejection fraction was in the range of 60% to 65%.  Wall motion was normal; there were no regional wall motion  abnormalities. Early diastolic septal annular tissue Doppler  velocities Ea were abnormal.   ------------------------------------------------------------  Aortic valve:  Mildly to moderately calcified annulus.  Doppler:  There was no stenosis.  No regurgitation.   ------------------------------------------------------------  Aorta: Aortic root: The aortic root was normal in size.  Ascending aorta: The ascending aorta was normal in size.   ------------------------------------------------------------  Mitral valve:  Structurally normal valve.  Leaflet  separation was normal. Doppler: Transvalvular velocity was  within the normal range. There was no evidence for stenosis.  No  regurgitation.  Peak gradient: 67mm Hg (D).   ------------------------------------------------------------  Left atrium: The atrium was normal in size.   ------------------------------------------------------------  Right ventricle: The cavity size was normal. Systolic  function was normal.   ------------------------------------------------------------  Pulmonic valve:  Structurally normal valve.  Cusp  separation was normal. Doppler: Transvalvular velocity was  within the normal range. No regurgitation.   ------------------------------------------------------------  Tricuspid valve:  Structurally normal valve.  Doppler:  Mild regurgitation.   ------------------------------------------------------------  Right atrium: The atrium was normal in size.   ------------------------------------------------------------  Pericardium: There was no pericardial effusion.   ------------------------------------------------------------  Systemic veins:  Inferior vena cava: The vessel was normal in size; the  respirophasic diameter changes were in the normal range (=  50%); findings are consistent with normal central venous  pressure.    Coronary CTA: FINDINGS: Cardiovascular: Limited study primarily due to bolus dispersion and intermittent respiratory motion. No evidence of central or lobar pulmonary embolism. No acute aortic finding. There is multifocal atherosclerotic calcification of the aorta and coronaries. Normal heart size. No pericardial effusion  Mediastinum/Nodes: Negative for adenopathy or mass  Lungs/Pleura: There is no edema, consolidation, effusion, or pneumothorax. Mild dependent atelectasis.  Upper Abdomen: Negative  Musculoskeletal: Spondylosis and generalized degenerative disease. 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 Osteopenia  Review of the MIP images confirms the above findings.  IMPRESSION: 1. Very limited pulmonary artery  evaluation. No evidence of main or lobar pulmonary embolism. 2. Aortic and coronary atherosclerosis.  EKG:  EKG is  ordered today.  The ekg ordered today demonstrates NSR with PVCs HR 69  Recent Labs: 07/14/2019: BUN 28; Creatinine, Ser 1.56; Hemoglobin 13.3; Platelets 210; Potassium 4.8; Sodium 139  Recent Lipid Panel No results found for: CHOL, TRIG, HDL, CHOLHDL, VLDL, LDLCALC, LDLDIRECT   Physical Exam:    VS:  BP 114/64   Pulse 70   Ht 5\' 6"  (1.676 m)   Wt 202 lb (91.6 kg)   SpO2 95%   BMI 32.60 kg/m     Wt Readings from Last 3 Encounters:  03/25/20 202 lb (91.6 kg)  08/06/19 209 lb (94.8 kg)  07/16/19 205 lb 7.5 oz (93.2 kg)     GEN:  Comfortable elderly male, sitting in a wheelchair, NAd HEENT: Normal NECK: No JVD; No carotid bruits CARDIAC: Irregular, no murmurs, rubs, gallops RESPIRATORY:  Clear to auscultation without rales, wheezing or rhonchi  ABDOMEN: Soft, non-tender, non-distended MUSCULOSKELETAL:  No edema; No deformity  SKIN: Warm and dry NEUROLOGIC:  Alert and oriented x 3 PSYCHIATRIC:  Normal affect   ASSESSMENT:    1. Palpitations   2. Irregular heart beat   3. Frequent PVCs   4. Coronary artery disease involving native heart without angina pectoris, unspecified vessel or lesion type   5. Primary hypertension   6. Mixed hyperlipidemia    PLAN:    In order of problems listed above:  #Palpitations: Patient with episode of heart pounding/jumping out of his chest in late Jan that lasted for about 1 day. Had some associated lightheadedness. Went to [employer] a couple days later and was told his HR was elevated as well as his BP. They wanted to call EMS but he declined. Followed up with his PCP who referred him here. Currently, no having any palpitations. Has occasional sensation of "skipped beats." No chest pain, SOB, current dizziness or syncope. Patient does not think he has a history of arrhythmias.  -Check 14 day zio monitor -Check TTE -Continue  metoprolol  #CAD with coronary calcification on CT chest: No current anginal symptoms. Denies history of cath or stent placement.  -Continue atorvastatin 20mg  daily -Continue home metoprolol and losartant -TTE as above  #HTN: Well controlled today. -Continue metoprolol and losartan as above  #HLD: -Continue lipitor 20mg  daily  #COPD: -Management per PCP   Medication Adjustments/Labs and Tests Ordered: Current medicines are reviewed at length with the patient today.  Concerns regarding medicines are outlined above.  Orders Placed This Encounter  Procedures  . LONG TERM MONITOR (3-14 DAYS)  . EKG 12-Lead  . ECHOCARDIOGRAM COMPLETE   No orders of the defined types were placed in this encounter.   Patient Instructions  Medication Instructions:  The current medical regimen is effective;  continue present plan and medications.  *If you need a refill on your cardiac medications before your next appointment, please call your pharmacy*  Testing/Procedures: Your physician has requested that you have an echocardiogram. Echocardiography is a painless test that uses sound waves to create images of your heart. It provides your doctor with information about the size and shape of your heart and how well your heart's chambers and valves are working. This procedure takes approximately one hour. There are no restrictions for this procedure.  ZIO XT- Long Term Monitor Instructions   Your physician has requested you wear your ZIO patch monitor 14 days.   This is a single patch monitor.  Irhythm supplies one patch monitor per enrollment.  Additional stickers are not available.   Please do not apply patch if you will be having a Nuclear Stress Test, Echocardiogram, Cardiac CT, MRI, or Chest Xray during the time frame you would be wearing the monitor. The patch cannot be worn during these tests.  You cannot remove and re-apply the ZIO XT patch monitor.   Your ZIO patch monitor will be sent  USPS Priority mail from [facility] directly to your home address. The monitor may also be mailed to a PO BOX if home delivery is not available.   It may take 3-5 days to receive your monitor after you have been enrolled.   Once you have received you monitor, please review enclosed instructions.  Your monitor has already been registered assigning a specific monitor serial # to you.   Applying the monitor   Shave hair from upper left chest.   Hold abrader disc by orange tab.  Rub abrader in 40 strokes over left upper chest as indicated in your monitor instructions.   Clean area with 4 enclosed alcohol pads .  Use  all pads to assure are is cleaned thoroughly.  Let dry.   Apply patch as indicated in monitor instructions.  Patch will be place under collarbone on left side of chest with arrow pointing upward.   Rub patch adhesive wings for 2 minutes.Remove white label marked "1".  Remove white label marked "2".  Rub patch adhesive wings for 2 additional minutes.   While looking in a mirror, press and release button in center of patch.  A small green light will flash 3-4 times .  This will be your only indicator the monitor has been turned on.     Do not shower for the first 24 hours.  You may shower after the first 24 hours.   Press button if you feel a symptom. You will hear a small click.  Record Date, Time and Symptom in the Patient Log Book.   When you are ready to remove patch, follow instructions on last 2 pages of Patient Log Book.  Stick patch monitor onto last page of Patient Log Book.   Place Patient Log Book in [place] box.  Use locking tab on box and tape box closed securely.  The [employer] and [employer] has [employer] on it.  Please place in mailbox as soon as possible.  Your physician should have your test results approximately 7 days after the monitor has been mailed back to [facility].   Call [employer] at [phone] if you have questions  regarding your ZIO XT patch monitor.  Call them immediately if you see an orange light blinking on your monitor.   If your monitor falls off in less than 4 days contact our Monitor department at [phone].  If your monitor becomes loose or falls off after 4 days call [employer] at [phone] for suggestions on securing your monitor.   Follow-Up: At [facility], you and your health needs are our priority.  As part of our continuing mission to provide you with exceptional heart care, we have created designated Provider Care Teams.  These Care Teams include your primary Cardiologist (physician) and Advanced Practice Providers (APPs -  Physician Assistants and Nurse Practitioners) who all work together to provide you with the care you need, when you need it.  We recommend signing up for the patient portal called "MyChart".  Sign up information is provided on this After Visit Summary.  MyChart is used to connect with patients for Virtual Visits (Telemedicine).  Patients are able to view lab/test results, encounter notes, upcoming appointments, etc.  Non-urgent messages can be sent to your provider as well.   To learn more about what you can do with MyChart, go to [email or web address].    Your next appointment:   3 month(s)  The format for your next appointment:   In Person  Provider:   Gwyndolyn Kaufman, MD   Thank you for choosing [facility]!!         Signed, Freada Bergeron, MD  03/25/2020 4:44 PM    [place]

## 2020-03-25 ENCOUNTER — Other Ambulatory Visit: Payer: Self-pay

## 2020-03-25 ENCOUNTER — Ambulatory Visit (INDEPENDENT_AMBULATORY_CARE_PROVIDER_SITE_OTHER): Payer: Medicare Other | Admitting: Cardiology

## 2020-03-25 ENCOUNTER — Encounter: Payer: Self-pay | Admitting: Cardiology

## 2020-03-25 ENCOUNTER — Ambulatory Visit (INDEPENDENT_AMBULATORY_CARE_PROVIDER_SITE_OTHER): Payer: Medicare Other

## 2020-03-25 VITALS — BP 114/64 | HR 70 | Ht 66.0 in | Wt 202.0 lb

## 2020-03-25 DIAGNOSIS — I499 Cardiac arrhythmia, unspecified: Secondary | ICD-10-CM

## 2020-03-25 DIAGNOSIS — I493 Ventricular premature depolarization: Secondary | ICD-10-CM

## 2020-03-25 DIAGNOSIS — I2510 Atherosclerotic heart disease of native coronary artery without angina pectoris: Secondary | ICD-10-CM

## 2020-03-25 DIAGNOSIS — R002 Palpitations: Secondary | ICD-10-CM | POA: Diagnosis not present

## 2020-03-25 DIAGNOSIS — I10 Essential (primary) hypertension: Secondary | ICD-10-CM

## 2020-03-25 DIAGNOSIS — E782 Mixed hyperlipidemia: Secondary | ICD-10-CM

## 2020-03-25 NOTE — Patient Instructions (Signed)
Medication Instructions:  The current medical regimen is effective;  continue present plan and medications.  *If you need a refill on your cardiac medications before your next appointment, please call your pharmacy*  Testing/Procedures: Your physician has requested that you have an echocardiogram. Echocardiography is a painless test that uses sound waves to create images of your heart. It provides your doctor with information about the size and shape of your heart and how well your heart's chambers and valves are working. This procedure takes approximately one hour. There are no restrictions for this procedure.  ZIO XT- Long Term Monitor Instructions   Your physician has requested you wear your ZIO patch monitor 14 days.   This is a single patch monitor.  Ir[employer] supplies one patch monitor per enrollment.  Additional stickers are not available.   Please do not apply patch if you will be having a Nuclear Stress Test, Echocardiogram, Cardiac CT, MRI, or Chest Xray during the time frame you would be wearing the monitor. The patch cannot be worn during these tests.  You cannot remove and re-apply the ZIO XT patch monitor.   Your ZIO patch monitor will be sent USPS Priority mail from [facility] directly to your home address. The monitor may also be mailed to a PO BOX if home delivery is not available.   It may take 3-5 days to receive your monitor after you have been enrolled.   Once you have received you monitor, please review enclosed instructions.  Your monitor has already been registered assigning a specific monitor serial # to you.   Applying the monitor   Shave hair from upper left chest.   Hold abrader disc by orange tab.  Rub abrader in 40 strokes over left upper chest as indicated in your monitor instructions.   Clean area with 4 enclosed alcohol pads .  Use all pads to assure are is cleaned thoroughly.  Let dry.   Apply patch as indicated in monitor instructions.  Patch  will be place under collarbone on left side of chest with arrow pointing upward.   Rub patch adhesive wings for 2 minutes.Remove white label marked "1".  Remove white label marked "2".  Rub patch adhesive wings for 2 additional minutes.   While looking in a mirror, press and release button in center of patch.  A small green light will flash 3-4 times .  This will be your only indicator the monitor has been turned on.     Do not shower for the first 24 hours.  You may shower after the first 24 hours.   Press button if you feel a symptom. You will hear a small click.  Record Date, Time and Symptom in the Patient Log Book.   When you are ready to remove patch, follow instructions on last 2 pages of Patient Log Book.  Stick patch monitor onto last page of Patient Log Book.   Place Patient Log Book in [place] box.  Use locking tab on box and tape box closed securely.  The [employer] has [employer] on it.  Please place in mailbox as soon as possible.  Your physician should have your test results approximately 7 days after the monitor has been mailed back to [facility].   Call [employer] at [phone] if you have questions regarding your ZIO XT patch monitor.  Call them immediately if you see an orange light blinking on your monitor.   If your monitor falls off in  less than 4 days contact our Monitor department at [phone].  If your monitor becomes loose or falls off after 4 days call [employer] at [phone] for suggestions on securing your monitor.   Follow-Up: At [facility], you and your health needs are our priority.  As part of our continuing mission to provide you with exceptional heart care, we have created designated Provider Care Teams.  These Care Teams include your primary Cardiologist (physician) and Advanced Practice Providers (APPs -  Physician Assistants and Nurse Practitioners) who all work together to provide you with the care you need, when  you need it.  We recommend signing up for the patient portal called "MyChart".  Sign up information is provided on this After Visit Summary.  MyChart is used to connect with patients for Virtual Visits (Telemedicine).  Patients are able to view lab/test results, encounter notes, upcoming appointments, etc.  Non-urgent messages can be sent to your provider as well.   To learn more about what you can do with MyChart, go to [email or web address].    Your next appointment:   3 month(s)  The format for your next appointment:   In Person  Provider:   Gwyndolyn Kaufman, MD   Thank you for choosing [facility]!!

## 2020-03-31 DIAGNOSIS — R002 Palpitations: Secondary | ICD-10-CM

## 2020-03-31 DIAGNOSIS — I493 Ventricular premature depolarization: Secondary | ICD-10-CM

## 2020-04-07 ENCOUNTER — Ambulatory Visit: Payer: Medicare Other | Admitting: Podiatry

## 2020-04-22 ENCOUNTER — Telehealth: Payer: Self-pay | Admitting: Cardiology

## 2020-04-22 NOTE — Telephone Encounter (Signed)
Follow up:     Patient returning a call back concering his monitor results. Please call patient.

## 2020-04-22 NOTE — Telephone Encounter (Signed)
Informed pt of results. Pt verbalized understanding. 

## 2020-05-04 ENCOUNTER — Other Ambulatory Visit: Payer: Self-pay | Admitting: Student

## 2020-05-04 DIAGNOSIS — Z87828 Personal history of other (healed) physical injury and trauma: Secondary | ICD-10-CM

## 2020-05-04 DIAGNOSIS — H532 Diplopia: Secondary | ICD-10-CM

## 2020-05-09 ENCOUNTER — Other Ambulatory Visit (HOSPITAL_COMMUNITY): Payer: Medicare Other

## 2020-05-14 ENCOUNTER — Inpatient Hospital Stay: Admission: RE | Admit: 2020-05-14 | Payer: Medicare Other | Source: Ambulatory Visit

## 2020-05-19 ENCOUNTER — Ambulatory Visit (HOSPITAL_COMMUNITY): Payer: Medicare Other | Attending: Cardiovascular Disease

## 2020-05-19 DIAGNOSIS — I2510 Atherosclerotic heart disease of native coronary artery without angina pectoris: Secondary | ICD-10-CM | POA: Diagnosis not present

## 2020-05-19 DIAGNOSIS — I493 Ventricular premature depolarization: Secondary | ICD-10-CM | POA: Diagnosis present

## 2020-05-19 DIAGNOSIS — R002 Palpitations: Secondary | ICD-10-CM

## 2020-05-19 LAB — ECHOCARDIOGRAM COMPLETE
Area-P 1/2: 2.62 cm2
S' Lateral: 2.8 cm

## 2020-05-19 MED ORDER — PERFLUTREN LIPID MICROSPHERE
1.0000 mL | INTRAVENOUS | Status: AC | PRN
  Administered 2020-05-19: 1 mL via INTRAVENOUS

## 2020-05-23 ENCOUNTER — Telehealth: Payer: Self-pay | Admitting: Cardiology

## 2020-05-23 MED ORDER — TOPROL XL 100 MG PO TB24: 100.0000 mg | ORAL_TABLET | Freq: Every day | ORAL | 0 refills | Status: DC

## 2020-05-23 NOTE — Telephone Encounter (Signed)
Pt called in returning Bobby Lane call about echo results .  He stated it was ok to leave a VT if he doesn't answer    Best number [phone]

## 2020-05-23 NOTE — Telephone Encounter (Signed)
Result Care Coordination    Result Notes   Tia Alert  05/23/2020 12:23 PM EDT Back to Top     Called patient and discussed results of echo and patient is requesting name brand Toprol XL. Patient requesting discussion with [facility] to approve medication. Patient verbalized understanding of echo results and reminded of followup appointment. Will you follow up on medication request? Thank you   Willeen Cass, RN  05/20/2020 12:40 PM EDT      Phone call placed to review results of echo and no answer. Voicemail message left to call back.   Freada Bergeron, MD  05/20/2020 12:22 PM EDT      His echo shows normal pumping function with no significant valve abnormalities or obstruction to blood flow. We just need to ensure his blood pressure is well controlled.

## 2020-05-23 NOTE — Telephone Encounter (Signed)
Spoke with the pt about his request for next time he goes to refill his script for Toprol XL 100 mg po daily, he would like this called in as DAW, under brand name Toprol XL, not the generic, for the generic causes him to be more fatigued. Sent in the refill to his confirmed pharmacy of choice, as DAW and under Brand name.  Note made to the pharmacy to fill this med as brand name only.  Pt is aware he may have to pay an additional cost for brand name of this med, but he states he hasn't had to in the past, and if he does, he is ok with this.  Pt verbalized understanding and agrees with this plan. Pt was more than gracious for all the assistance provided.

## 2020-05-25 ENCOUNTER — Telehealth: Payer: Self-pay

## 2020-05-25 NOTE — Telephone Encounter (Signed)
**Note De-Identified [facility] Obfuscation** Willeen Cass, RN  Nuala Alpha, LPN Cc: Alva Broxson, Deliah Boston, LPN Called patient and discussed results of echo and patient is requesting name brand Toprol XL. Patient requesting discussion with [facility] to approve medication. Patient verbalized understanding of echo results and reminded of followup appointment. Will you follow up on medication request? Thank you    I called the pt to discuss why he needs to take name brand Toprol XL vs generic Metoprolol and if he has ever tried and failed any other beta blockers. Per the pt generic Metoprolol is making him feel jittery, fatigued and is not effective in controlling his BP. He states that he may have taken Carvedilol in the past but experienced the same results as with Metoprolol and he could not take.  He is aware that I am attempting a name brand Toprol PA and that if his plan denies it he may have to pay out of pocket for Toprol XL.  I started the Toprol XL PA through covermymeds. Key: [id number]

## 2020-05-26 ENCOUNTER — Ambulatory Visit: Payer: Medicare Other | Admitting: Podiatry

## 2020-05-31 ENCOUNTER — Other Ambulatory Visit: Payer: Medicare Other

## 2020-06-14 ENCOUNTER — Telehealth: Payer: Self-pay | Admitting: Cardiology

## 2020-06-14 NOTE — Telephone Encounter (Signed)
Pt needs his last OV notes and any testing or lab work that was done at the office faxed to [employer]. He has two fax numbers, [phone] and [phone]

## 2020-07-02 NOTE — Progress Notes (Deleted)
Cardiology Office Note:    Date:  07/02/2020   ID:  Bobby Lane, DOB Jun 15, 1940, MRN [id number]  PCP:  Jon Billings, NP  [facility] Cardiologist:  None  [facility] Electrophysiologist:  None   Referring MD: Jon Billings, NP     History of Present Illness:    Bobby Lane is a 80 y.o. male with a hx of COPD, CAD, gout, HTN, and GERD who presents to clinic for follow-up of palpitations.  Patient initially saw me on 03/25/20 for palpitations that he developed at the end of Jan 2022. Specifically, he felt a pounding in his chest. Had some lightheadedness with this. Tried to rest and the symptoms eventually improved. He went to [facility] a couple days later where medical students were checking people's heart rates and he was told his heart rate was 140s and his blood pressure was elevated. They wanted to call EMS but he declined. He presented to clinic on 03/25/20 where cardiac monitor was performed on 04/21/20 which showed 55 episodes of SVT 55 episodes of SVT with longest lasting 13 seconds, no Afib or sustained arrhythmias. TTE 05/19/20 hyperdynamic LV systolic function with intracavitary gradient, G1DD, no significant valve disease.  Today,  Past Medical History:  Diagnosis Date  . Arthritis   . Cancer ([place])    SKIN  . COPD (chronic obstructive pulmonary disease) ([place])   . Coronary artery disease   . Depression   . Dyspnea    DOE  . GERD (gastroesophageal reflux disease)   . Gout   . Heart murmur   . History of hiatal hernia   . History of kidney stones   . History of wheezing   . HOH (hard of hearing)   . Hypertension   . Hypothyroidism   . Oxygen deficit    PRN USE; used ~ 2014 for less than a year (no longer has as of 07/15/19)  . Pneumonia   . Renal disorder   . Thyroid disease     Past Surgical History:  Procedure Laterality Date  . ANTERIOR CERVICAL DECOMP/DISCECTOMY FUSION N/A 07/16/2019   Procedure: Cervical three-four Cervical five-six Cervical  six-seven Anterior cervical decompression/discectomy/fusion;  Surgeon: Erline Levine, MD;  Location: [place];  Service: Neurosurgery;  Laterality: N/A;  3C  . CARDIAC CATHETERIZATION    . CARPAL TUNNEL RELEASE Left 07/16/2019   Procedure: LEFT CARPAL TUNNEL RELEASE;  Surgeon: Erline Levine, MD;  Location: [place];  Service: Neurosurgery;  Laterality: Left;  . CATARACT EXTRACTION W/PHACO Right 08/21/2016   Procedure: CATARACT EXTRACTION PHACO AND INTRAOCULAR LENS PLACEMENT (IOC);  Surgeon: Birder Robson, MD;  Location: [facility];  Service: Ophthalmology;  Laterality: Right;  [place] 00:54.9 AP% 18.3 CDE 10.04 Fluid pack lot # [id number]  . CATARACT EXTRACTION W/PHACO Left 09/18/2016   Procedure: CATARACT EXTRACTION PHACO AND INTRAOCULAR LENS PLACEMENT (IOC);  Surgeon: Birder Robson, MD;  Location: [facility];  Service: Ophthalmology;  Laterality: Left;  [place] 00:56 AP% 19.7 CDE 11.07 Fluid pack lot # [id number]  . FRACTURE SURGERY     ANKLE  . HERNIA REPAIR    . UPPER GI ENDOSCOPY      Current Medications: No outpatient medications have been marked as taking for the 07/04/20 encounter (Appointment) with Freada Bergeron, MD.     Allergies:   Codeine, Morphine and related, Novocain [procaine], Other, Percocet [oxycodone-acetaminophen], Sulfa antibiotics, and Tramadol   Social History   Socioeconomic History  . Marital status: Single    Spouse name: Not on  file  . Number of children: Not on file  . Years of education: Not on file  . Highest education level: Not on file  Occupational History  . Not on file  Tobacco Use  . Smoking status: Never Smoker  . Smokeless tobacco: Never Used  Vaping Use  . Vaping Use: Never used  Substance and Sexual Activity  . Alcohol use: No  . Drug use: No  . Sexual activity: Not on file  Other Topics Concern  . Not on file  Social History Narrative  . Not on file   Social Determinants of Health   Financial Resource Strain: Not on file  Food  Insecurity: Not on file  Transportation Needs: Not on file  Physical Activity: Not on file  Stress: Not on file  Social Connections: Not on file     Family History: The patient's family history includes Brain cancer in his father. There is no history of Prostate cancer, Bladder Cancer, or Kidney cancer.  ROS:   Please see the history of present illness.    Review of Systems  Constitutional: Negative for chills and fever.  HENT: Negative for nosebleeds.   Eyes: Negative for blurred vision and redness.  Respiratory: Negative for shortness of breath.   Cardiovascular: Positive for palpitations. Negative for chest pain, orthopnea, claudication, leg swelling and PND.  Gastrointestinal: Negative for nausea and vomiting.  Genitourinary: Negative for dysuria and hematuria.  Musculoskeletal: Positive for joint pain.  Neurological: Positive for dizziness. Negative for speech change and loss of consciousness.  Endo/Heme/Allergies: Negative for polydipsia.    EKGs/Labs/Other Studies Reviewed:    The following studies were reviewed today: TTE 05/28/20: IMPRESSIONS    1. Intracavitary gradient at rest. Peak velocity 1.87 m/s. Peak gradient  14 mmHg. Peak gradient increases to 36 mmHg and peak velocity increases to  3 m/s with Valsalva. Left ventricular ejection fraction, by estimation, is  70 to 75%. The left ventricle  has hyperdynamic function. The left ventricle has no regional wall motion  abnormalities. Left ventricular diastolic parameters are consistent with  Grade I diastolic dysfunction (impaired relaxation).  2. Right ventricular systolic function is normal. The right ventricular  size is normal.  3. The mitral valve is normal in structure. No evidence of mitral valve  regurgitation. No evidence of mitral stenosis.  4. The aortic valve is normal in structure. Aortic valve regurgitation is  not visualized. No aortic stenosis is present.  5. The inferior vena cava is  normal in size with greater than 50%  respiratory variability, suggesting right atrial pressure of 3 mmHg.   Comparison(s): 06/23/13 EF 60-65%.   Cardiac Monitor 04/21/20:  Patch wear time 13 days and 17 hours  Predominant rhythm is NSR with average HR 70; ranging from 50-187bpm  There were 55 episodes of SVT with longest lasting 13.2 seconds at rate 187bpm (also the fastest episode); did not correlate with patient triggered event.  Isolated SVEs and PVCs were rate  Ventricular bigeminy and trigeminy were present  No Afib, significant pauses or VT    Patch Wear Time:  13 days and 17 hours (2022-02-17T15:00:04-498 to 2022-03-03T08:36:54-0500)  Patient had a min HR of 50 bpm, max HR of 187 bpm, and avg HR of 70 bpm. Predominant underlying rhythm was Sinus Rhythm. 55 Supraventricular Tachycardia runs occurred, the run with the fastest interval lasting 13.2 secs with a max rate of 187 bpm (avg  149 bpm); the run with the fastest interval was also the longest. Idioventricular Rhythm  was present. Ectopic Atrial Rhythm was present. Isolated SVEs were rare (<1.0%), SVE Couplets were rare (<1.0%), and SVE Triplets were rare (<1.0%). Isolated VEs  were rare (<1.0%, 5778), VE Couplets were rare (<1.0%, 36), and VE Triplets were rare (<1.0%, 1). Ventricular Bigeminy and Trigeminy were present.    TTE 06/23/13: ------------------------------------------------------------  Left ventricle: The cavity size was normal. Wall thickness  was increased in a pattern of mild LVH. There was focal  basal hypertrophy. Systolic function was normal. The  estimated ejection fraction was in the range of 60% to 65%.  Wall motion was normal; there were no regional wall motion  abnormalities. Early diastolic septal annular tissue Doppler  velocities Ea were abnormal.   ------------------------------------------------------------  Aortic valve:  Mildly to moderately calcified annulus.  Doppler:  There was  no stenosis.  No regurgitation.   ------------------------------------------------------------  Aorta: Aortic root: The aortic root was normal in size.  Ascending aorta: The ascending aorta was normal in size.   ------------------------------------------------------------  Mitral valve:  Structurally normal valve.  Leaflet  separation was normal. Doppler: Transvalvular velocity was  within the normal range. There was no evidence for stenosis.  No regurgitation.  Peak gradient: 87mm Hg (D).   ------------------------------------------------------------  Left atrium: The atrium was normal in size.   ------------------------------------------------------------  Right ventricle: The cavity size was normal. Systolic  function was normal.   ------------------------------------------------------------  Pulmonic valve:  Structurally normal valve.  Cusp  separation was normal. Doppler: Transvalvular velocity was  within the normal range. No regurgitation.   ------------------------------------------------------------  Tricuspid valve:  Structurally normal valve.  Doppler:  Mild regurgitation.   ------------------------------------------------------------  Right atrium: The atrium was normal in size.   ------------------------------------------------------------  Pericardium: There was no pericardial effusion.   ------------------------------------------------------------  Systemic veins:  Inferior vena cava: The vessel was normal in size; the  respirophasic diameter changes were in the normal range (=  50%); findings are consistent with normal central venous  pressure.    Coronary CTA: FINDINGS: Cardiovascular: Limited study primarily due to bolus dispersion and intermittent respiratory motion. No evidence of central or lobar pulmonary embolism. No acute aortic finding. There is multifocal atherosclerotic calcification of the aorta and coronaries.  Normal heart size. No pericardial effusion  Mediastinum/Nodes: Negative for adenopathy or mass  Lungs/Pleura: There is no edema, consolidation, effusion, or pneumothorax. Mild dependent atelectasis.  Upper Abdomen: Negative  Musculoskeletal: Spondylosis and generalized degenerative disease. 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 Osteopenia  Review of the MIP images confirms the above findings.  IMPRESSION: 1. Very limited pulmonary artery evaluation. No evidence of main or lobar pulmonary embolism. 2. Aortic and coronary atherosclerosis.  EKG:  EKG is  ordered today.  The ekg ordered today demonstrates NSR with PVCs HR 69  Recent Labs: 07/14/2019: BUN 28; Creatinine, Ser 1.56; Hemoglobin 13.3; Platelets 210; Potassium 4.8; Sodium 139  Recent Lipid Panel No results found for: CHOL, TRIG, HDL, CHOLHDL, VLDL, LDLCALC, LDLDIRECT   Physical Exam:    VS:  There were no vitals taken for this visit.    Wt Readings from Last 3 Encounters:  03/25/20 202 lb (91.6 kg)  08/06/19 209 lb (94.8 kg)  07/16/19 205 lb 7.5 oz (93.2 kg)     GEN:  Comfortable elderly male, sitting in a wheelchair, NAd HEENT: Normal NECK: No JVD; No carotid bruits CARDIAC: Irregular, no murmurs, rubs, gallops RESPIRATORY:  Clear to auscultation without rales, wheezing or rhonchi  ABDOMEN: Soft, non-tender, non-distended MUSCULOSKELETAL:  No edema; No deformity  SKIN: Warm and dry NEUROLOGIC:  Alert and oriented x 3 PSYCHIATRIC:  Normal affect   ASSESSMENT:    No diagnosis found. PLAN:    In order of problems listed above:  #Palpitations: #SVT: Patient with episode of heart pounding/jumping out of his chest in late Jan that lasted for about 1 day. Had some associated lightheadedness. Went to [employer] a couple days later and was told his HR was elevated as well as his BP. Obatined cardiac monitor that showed 55 runs of nonsustained SVT but no afib or sustained arrhythmias. TTE with  hyperdynamic function and G1DD, no valve disease. -Continue metoprolol 100mg  XL daily  #CAD with coronary calcification on CT chest: No current anginal symptoms. Denies history of cath or stent placement.  -Continue atorvastatin 20mg  daily -Continue home metoprolol 100mg  XL daily and losartan 100mg  daily  #HTN: Well controlled today. -Continue home metoprolol 100mg  XL daily and losartan 100mg  daily  #HLD: -Off lipitor now transitioned to repatha 140mg  BID -Having issues with insurance for coverage  #COPD: -Management per PCP   Medication Adjustments/Labs and Tests Ordered: Current medicines are reviewed at length with the patient today.  Concerns regarding medicines are outlined above.  No orders of the defined types were placed in this encounter.  No orders of the defined types were placed in this encounter.   There are no Patient Instructions on file for this visit.   Signed, Freada Bergeron, MD  07/02/2020 3:12 PM    [facility]

## 2020-07-04 ENCOUNTER — Encounter (INDEPENDENT_AMBULATORY_CARE_PROVIDER_SITE_OTHER): Payer: Self-pay

## 2020-07-04 ENCOUNTER — Encounter: Payer: Self-pay | Admitting: Cardiology

## 2020-07-04 ENCOUNTER — Ambulatory Visit (INDEPENDENT_AMBULATORY_CARE_PROVIDER_SITE_OTHER): Payer: Medicare Other | Admitting: Cardiology

## 2020-07-04 ENCOUNTER — Telehealth: Payer: Self-pay

## 2020-07-04 ENCOUNTER — Other Ambulatory Visit: Payer: Self-pay

## 2020-07-04 VITALS — BP 120/80 | HR 69 | Ht 66.0 in | Wt 211.0 lb

## 2020-07-04 DIAGNOSIS — I10 Essential (primary) hypertension: Secondary | ICD-10-CM | POA: Diagnosis not present

## 2020-07-04 DIAGNOSIS — R002 Palpitations: Secondary | ICD-10-CM | POA: Diagnosis not present

## 2020-07-04 DIAGNOSIS — E782 Mixed hyperlipidemia: Secondary | ICD-10-CM | POA: Diagnosis not present

## 2020-07-04 DIAGNOSIS — J449 Chronic obstructive pulmonary disease, unspecified: Secondary | ICD-10-CM

## 2020-07-04 DIAGNOSIS — I2510 Atherosclerotic heart disease of native coronary artery without angina pectoris: Secondary | ICD-10-CM

## 2020-07-04 MED ORDER — REPATHA SURECLICK 140 MG/ML ~~LOC~~ SOAJ: 1.0000 mL | SUBCUTANEOUS | 11 refills | Status: DC

## 2020-07-04 NOTE — Progress Notes (Addendum)
Cardiology Office Note:    Date:  07/04/2020   ID:  Bobby Lane, DOB [birth year], MRN [id number]  PCP:  Jon Billings, NP  [facility] Cardiologist:  None  [facility] Electrophysiologist:  None   Referring MD: Jon Billings, NP     History of Present Illness:    Bobby Lane is a 80 y.o. male with a hx of COPD, CAD, gout, HTN, and GERD who presents to clinic for follow-up of palpitations.  Patient initially saw me on 03/25/20 for palpitations that he developed at the end of Jan 2022. Specifically, he felt a pounding in his chest. Had some lightheadedness with this. Tried to rest and the symptoms eventually improved. He went to [facility] a couple days later where medical students were checking people's heart rates and he was told his heart rate was 140s and his blood pressure was elevated. They wanted to call EMS but he declined. He presented to clinic on 03/25/20 where cardiac monitor was performed on 04/21/20 which showed 55 episodes of SVT with longest lasting 13 seconds, no Afib or sustained arrhythmias. TTE 05/19/20 hyperdynamic LV systolic function with intracavitary gradient, G1DD, no significant valve disease.  Today, he is feeling lousy due to neuropathy and arthritis. He is not noticing any improvement in his right LE, and he has lost mobility in his right knee. He is aggravated at not being able to do what he needs to do. Lately, he reports having 2-3 episodes of gastritis, but has not felt many palpitations at all. He does not consider himself to have shortness of breath, although he states it is easier for him to "give out" with fatigue.  He also reports that his atorvastatin was discontinued and Repatha was added. However, he is still waiting on the insurance company to cover [place]. It has been 2 weeks and 2 days since his previous injection. Also, lately he has been taking the generic of metoprolol, but would prefer the brand name.  He denies any chest pain,  shortness of breath, or exertional symptoms. No lightheadedness, or syncope to report. Also has no lower extremity edema, orthopnea or PND.   Past Medical History:  Diagnosis Date  . Arthritis   . Cancer ([place])    SKIN  . COPD (chronic obstructive pulmonary disease) ([place])   . Coronary artery disease   . Depression   . Dyspnea    DOE  . GERD (gastroesophageal reflux disease)   . Gout   . Heart murmur   . History of hiatal hernia   . History of kidney stones   . History of wheezing   . HOH (hard of hearing)   . Hypertension   . Hypothyroidism   . Oxygen deficit    PRN USE; used ~ 2014 for less than a year (no longer has as of 07/15/19)  . Pneumonia   . Renal disorder   . Thyroid disease     Past Surgical History:  Procedure Laterality Date  . ANTERIOR CERVICAL DECOMP/DISCECTOMY FUSION N/A 07/16/2019   Procedure: Cervical three-four Cervical five-six Cervical six-seven Anterior cervical decompression/discectomy/fusion;  Surgeon: Erline Levine, MD;  Location: [place];  Service: Neurosurgery;  Laterality: N/A;  3C  . CARDIAC CATHETERIZATION    . CARPAL TUNNEL RELEASE Left 07/16/2019   Procedure: LEFT CARPAL TUNNEL RELEASE;  Surgeon: Erline Levine, MD;  Location: [place];  Service: Neurosurgery;  Laterality: Left;  . CATARACT EXTRACTION W/PHACO Right 08/21/2016   Procedure: CATARACT EXTRACTION PHACO AND INTRAOCULAR LENS PLACEMENT (  [place]);  Surgeon: Birder Robson, MD;  Location: [facility];  Service: Ophthalmology;  Laterality: Right;  [place] 00:54.9 AP% 18.3 CDE 10.04 Fluid pack lot # [id number]  . CATARACT EXTRACTION W/PHACO Left 09/18/2016   Procedure: CATARACT EXTRACTION PHACO AND INTRAOCULAR LENS PLACEMENT ([facility]);  Surgeon: Birder Robson, MD;  Location: [facility];  Service: Ophthalmology;  Laterality: Left;  [place] 00:56 AP% 19.7 CDE 11.07 Fluid pack lot # [id number]  . FRACTURE SURGERY     ANKLE  . HERNIA REPAIR    . UPPER GI ENDOSCOPY      Current Medications: Current Meds  Medication  Sig  . acetaminophen (TYLENOL) 325 MG tablet Take 2 tablets (650 mg total) by mouth every 8 (eight) hours.  Marland Kitchen acetaminophen (TYLENOL) 650 MG CR tablet Take 1,300 mg by mouth every 8 (eight) hours as needed for pain.  Marland Kitchen allopurinol (ZYLOPRIM) 300 MG tablet TAKE 1 TABLET(300 MG) BY MOUTH DAILY  . B Complex-C (SUPER B COMPLEX PO) Take 1 tablet by mouth daily.  . Cholecalciferol (VITAMIN D3) 5000 UNITS TABS Take 5,000 Units by mouth daily.  . clonazePAM (KLONOPIN) 0.5 MG tablet Take 0.5 mg by mouth at bedtime as needed.  . diclofenac Sodium (VOLTAREN) 1 % GEL diclofenac 1 % topical gel  . DULoxetine (CYMBALTA) 30 MG capsule TAKE ONE CAPSULE BY MOUTH TWICE DAILY  . finasteride (PROSCAR) 5 MG tablet Take 1 tablet (5 mg total) by mouth daily.  Marland Kitchen levothyroxine (SYNTHROID) 100 MCG tablet Take 100 mcg by mouth daily before breakfast.  . losartan (COZAAR) 100 MG tablet Take 1 tablet (100 mg total) by mouth daily.  Marland Kitchen REPATHA SURECLICK 979 MG/ML SOAJ Inject 1 mL into the skin every 14 (fourteen) days.  Marland Kitchen spironolactone (ALDACTONE) 25 MG tablet Take 12.5 mg by mouth daily.  . tamsulosin (FLOMAX) 0.4 MG CAPS capsule Take 1 capsule (0.4 mg total) by mouth daily.  . TOPROL XL 100 MG 24 hr tablet Take 1 tablet (100 mg total) by mouth daily. Take with or immediately following a meal.     Allergies:   Codeine, Morphine and related, Novocain [procaine], Other, Percocet [oxycodone-acetaminophen], Sulfa antibiotics, and Tramadol   Social History   Socioeconomic History  . Marital status: Single    Spouse name: Not on file  . Number of children: Not on file  . Years of education: Not on file  . Highest education level: Not on file  Occupational History  . Not on file  Tobacco Use  . Smoking status: Never Smoker  . Smokeless tobacco: Never Used  Vaping Use  . Vaping Use: Never used  Substance and Sexual Activity  . Alcohol use: No  . Drug use: No  . Sexual activity: Not on file  Other Topics Concern   . Not on file  Social History Narrative  . Not on file   Social Determinants of Health   Financial Resource Strain: Not on file  Food Insecurity: Not on file  Transportation Needs: Not on file  Physical Activity: Not on file  Stress: Not on file  Social Connections: Not on file     Family History: The patient's family history includes Brain cancer in his father. There is no history of Prostate cancer, Bladder Cancer, or Kidney cancer.  ROS:   Please see the history of present illness.    Review of Systems  Constitutional: Positive for malaise/fatigue. Negative for chills and fever.  HENT: Negative for nosebleeds.   Eyes: Negative for blurred vision and redness.  Respiratory: Negative for shortness of breath.   Cardiovascular: Positive for palpitations. Negative for chest pain, orthopnea, claudication, leg swelling and PND.  Gastrointestinal: Negative for nausea and vomiting.  Genitourinary: Negative for dysuria and hematuria.  Musculoskeletal: Negative for joint pain.  Neurological: Positive for headaches. Negative for dizziness, speech change and loss of consciousness.  Endo/Heme/Allergies: Negative for polydipsia.    EKGs/Labs/Other Studies Reviewed:    The following studies were reviewed today: TTE 06/11/2020: IMPRESSIONS    1. Intracavitary gradient at rest. Peak velocity 1.87 m/s. Peak gradient  14 mmHg. Peak gradient increases to 36 mmHg and peak velocity increases to  3 m/s with Valsalva. Left ventricular ejection fraction, by estimation, is  70 to 75%. The left ventricle  has hyperdynamic function. The left ventricle has no regional wall motion  abnormalities. Left ventricular diastolic parameters are consistent with  Grade I diastolic dysfunction (impaired relaxation).  2. Right ventricular systolic function is normal. The right ventricular  size is normal.  3. The mitral valve is normal in structure. No evidence of mitral valve  regurgitation. No evidence  of mitral stenosis.  4. The aortic valve is normal in structure. Aortic valve regurgitation is  not visualized. No aortic stenosis is present.  5. The inferior vena cava is normal in size with greater than 50%  respiratory variability, suggesting right atrial pressure of 3 mmHg.   Comparison(s): 06/23/13 EF 60-65%.   Cardiac Monitor 04/21/20:  Patch wear time 13 days and 17 hours  Predominant rhythm is NSR with average HR 70; ranging from 50-187bpm  There were 55 episodes of SVT with longest lasting 13.2 seconds at rate 187bpm (also the fastest episode); did not correlate with patient triggered event.  Isolated SVEs and PVCs were rate  Ventricular bigeminy and trigeminy were present  No Afib, significant pauses or VT    Patch Wear Time:  13 days and 17 hours (2022-02-17T15:00:04-498 to 2022-03-03T08:36:54-0500)  Patient had a min HR of 50 bpm, max HR of 187 bpm, and avg HR of 70 bpm. Predominant underlying rhythm was Sinus Rhythm. 55 Supraventricular Tachycardia runs occurred, the run with the fastest interval lasting 13.2 secs with a max rate of 187 bpm (avg  149 bpm); the run with the fastest interval was also the longest. Idioventricular Rhythm was present. Ectopic Atrial Rhythm was present. Isolated SVEs were rare (<1.0%), SVE Couplets were rare (<1.0%), and SVE Triplets were rare (<1.0%). Isolated VEs  were rare (<1.0%, 5778), VE Couplets were rare (<1.0%, 36), and VE Triplets were rare (<1.0%, 1). Ventricular Bigeminy and Trigeminy were present.    TTE 06/23/13: ------------------------------------------------------------  Left ventricle: The cavity size was normal. Wall thickness  was increased in a pattern of mild LVH. There was focal  basal hypertrophy. Systolic function was normal. The  estimated ejection fraction was in the range of 60% to 65%.  Wall motion was normal; there were no regional wall motion  abnormalities. Early diastolic septal annular tissue Doppler   velocities Ea were abnormal.   ------------------------------------------------------------  Aortic valve:  Mildly to moderately calcified annulus.  Doppler:  There was no stenosis.  No regurgitation.   ------------------------------------------------------------  Aorta: Aortic root: The aortic root was normal in size.  Ascending aorta: The ascending aorta was normal in size.   ------------------------------------------------------------  Mitral valve:  Structurally normal valve.  Leaflet  separation was normal. Doppler: Transvalvular velocity was  within the normal range. There was no evidence for stenosis.  No regurgitation.  Peak gradient: 67mm Hg (  D).   ------------------------------------------------------------  Left atrium: The atrium was normal in size.   ------------------------------------------------------------  Right ventricle: The cavity size was normal. Systolic  function was normal.   ------------------------------------------------------------  Pulmonic valve:  Structurally normal valve.  Cusp  separation was normal. Doppler: Transvalvular velocity was  within the normal range. No regurgitation.   ------------------------------------------------------------  Tricuspid valve:  Structurally normal valve.  Doppler:  Mild regurgitation.   ------------------------------------------------------------  Right atrium: The atrium was normal in size.   ------------------------------------------------------------  Pericardium: There was no pericardial effusion.   ------------------------------------------------------------  Systemic veins:  Inferior vena cava: The vessel was normal in size; the  respirophasic diameter changes were in the normal range (=  50%); findings are consistent with normal central venous  pressure.    Coronary CTA: FINDINGS: Cardiovascular: Limited study primarily due to bolus dispersion and intermittent respiratory  motion. No evidence of central or lobar pulmonary embolism. No acute aortic finding. There is multifocal atherosclerotic calcification of the aorta and coronaries. Normal heart size. No pericardial effusion  Mediastinum/Nodes: Negative for adenopathy or mass  Lungs/Pleura: There is no edema, consolidation, effusion, or pneumothorax. Mild dependent atelectasis.  Upper Abdomen: Negative  Musculoskeletal: Spondylosis and generalized degenerative disease. 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 Osteopenia  Review of the MIP images confirms the above findings.  IMPRESSION: 1. Very limited pulmonary artery evaluation. No evidence of main or lobar pulmonary embolism. 2. Aortic and coronary atherosclerosis.  EKG:    07/04/2020: EKG is not ordered today. 03/25/2020: NSR with PVCs HR 69  Recent Labs: 07/14/2019: BUN 28; Creatinine, Ser 1.56; Hemoglobin 13.3; Platelets 210; Potassium 4.8; Sodium 139  Recent Lipid Panel No results found for: CHOL, TRIG, HDL, CHOLHDL, VLDL, LDLCALC, LDLDIRECT   Physical Exam:    VS:  BP 120/80   Pulse 69   Ht 5\' 6"  (1.676 m)   Wt 211 lb (95.7 kg)   SpO2 95%   BMI 34.06 kg/m     Wt Readings from Last 3 Encounters:  07/04/20 211 lb (95.7 kg)  03/25/20 202 lb (91.6 kg)  08/06/19 209 lb (94.8 kg)     GEN:  Comfortable elderly male, sitting in a wheelchair, NAd HEENT: Normal NECK: No JVD; No carotid bruits CARDIAC: Irregular, no murmurs, rubs, gallops RESPIRATORY:  Clear to auscultation without rales, wheezing or rhonchi  ABDOMEN: Soft, non-tender, non-distended MUSCULOSKELETAL:  No edema; No deformity  SKIN: Warm and dry NEUROLOGIC:  Alert and oriented x 3 PSYCHIATRIC:  Normal affect   ASSESSMENT:    1. Palpitations   2. Coronary artery disease involving native heart without angina pectoris, unspecified vessel or lesion type   3. Primary hypertension   4. Mixed hyperlipidemia   5. Chronic obstructive pulmonary disease,  unspecified COPD type ([place])    PLAN:    In order of problems listed above:  #Palpitations: #SVT: Patient with episode of heart pounding/jumping out of his chest in late Jan that lasted for about 1 day. Had some associated lightheadedness. Went to [employer] a couple days later and was told his HR was elevated as well as his BP. Obatined cardiac monitor that showed 55 runs of nonsustained SVT but no afib or sustained arrhythmias. TTE with hyperdynamic function and G1DD, no valve disease. Palpitations significantly improved on metoprolol. -Continue metoprolol 100mg  XL daily  #CAD with coronary calcification on CT chest: No current anginal symptoms. Denies history of cath or stent placement.  -Continue repatha 140mg  q14d--will ask pharmacy to look into coverage -Continue home metoprolol  100mg  XL daily and losartan 100mg  daily  #HTN: Well controlled today. -Continue home metoprolol 100mg  XL daily and losartan 100mg  daily  #HLD: -Continue repatha 140mg  q14d--will ask pharmacy to look into coverage  #COPD: -Management per PCP  #Peripheral Neuropathy: #Joint Pain: Followed by PCP and awaiting motorized scooter. -Continue management per PCP  Follow-up in 6-8 months.  Medication Adjustments/Labs and Tests Ordered: Current medicines are reviewed at length with the patient today.  Concerns regarding medicines are outlined above.  No orders of the defined types were placed in this encounter.  No orders of the defined types were placed in this encounter.   Patient Instructions  Medication Instructions:   Your physician recommends that you continue on your current medications as directed. Please refer to the Current Medication list given to you today.  *If you need a refill on your cardiac medications before your next appointment, please call your pharmacy*   Follow-Up:  4 MONTHS IN THE OFFICE WITH AN EXTENDER      I,Mathew Stumpf,acting as a scribe for Freada Bergeron,  MD.,have documented all relevant documentation on the behalf of Freada Bergeron, MD,as directed by  Freada Bergeron, MD while in the presence of Freada Bergeron, MD.  I, Freada Bergeron, MD, have reviewed all documentation for this visit. The documentation on 07/04/20 for the exam, diagnosis, procedures, and orders are all accurate and complete.  Signed, Freada Bergeron, MD  07/04/2020 2:23 PM    [facility]

## 2020-07-04 NOTE — Patient Instructions (Signed)
Medication Instructions:   Your physician recommends that you continue on your current medications as directed. Please refer to the Current Medication list given to you today.  *If you need a refill on your cardiac medications before your next appointment, please call your pharmacy*   Follow-Up:  4 MONTHS IN THE OFFICE WITH AN EXTENDER

## 2020-07-04 NOTE — Telephone Encounter (Signed)
Called and spoke w/pt and was able to get them approved, rx sent ad pt instructed to complete fasting lipid labs on 09/05/20 at 830 am as scheduled and we were able to get the [employer] grant approved for $2500

## 2020-08-03 DIAGNOSIS — E039 Hypothyroidism, unspecified: Secondary | ICD-10-CM | POA: Insufficient documentation

## 2020-08-03 DIAGNOSIS — E785 Hyperlipidemia, unspecified: Secondary | ICD-10-CM | POA: Insufficient documentation

## 2020-08-03 DIAGNOSIS — J309 Allergic rhinitis, unspecified: Secondary | ICD-10-CM | POA: Insufficient documentation

## 2020-08-03 DIAGNOSIS — H9190 Unspecified hearing loss, unspecified ear: Secondary | ICD-10-CM | POA: Insufficient documentation

## 2020-08-03 DIAGNOSIS — E7800 Pure hypercholesterolemia, unspecified: Secondary | ICD-10-CM | POA: Insufficient documentation

## 2020-08-04 ENCOUNTER — Ambulatory Visit (INDEPENDENT_AMBULATORY_CARE_PROVIDER_SITE_OTHER): Payer: Medicare Other | Admitting: Podiatry

## 2020-08-04 ENCOUNTER — Encounter: Payer: Self-pay | Admitting: Podiatry

## 2020-08-04 ENCOUNTER — Other Ambulatory Visit: Payer: Self-pay

## 2020-08-04 DIAGNOSIS — N289 Disorder of kidney and ureter, unspecified: Secondary | ICD-10-CM

## 2020-08-04 DIAGNOSIS — M79675 Pain in left toe(s): Secondary | ICD-10-CM | POA: Diagnosis not present

## 2020-08-04 DIAGNOSIS — M79674 Pain in right toe(s): Secondary | ICD-10-CM

## 2020-08-04 DIAGNOSIS — B351 Tinea unguium: Secondary | ICD-10-CM | POA: Diagnosis not present

## 2020-08-04 NOTE — Progress Notes (Signed)
This patient returns to my office for at risk foot care.  This patient requires this care by a professional since this patient will be at risk due to having renal disease.  This patient is unable to cut nails himself  since the patient cannot reach his  nails.These nails are painful walking and wearing shoes.  This patient presents for at risk foot care today.  Patient has not been seen in 7 months since he was hospitalized.  General Appearance  Alert, conversant and in no acute stress.  Vascular  Dorsalis pedis and posterior tibial  pulses are not  palpable  bilaterally.  Capillary return is within normal limits  bilaterally. Temperature is within normal limits  bilaterally.  Neurologic  Senn-Weinstein monofilament wire test within normal limits  bilaterally. Muscle power within normal limits bilaterally.  Nails Thick disfigured discolored nails with subungual debris  from hallux to fifth toes bilaterally. No evidence of bacterial infection or drainage bilaterally.  Orthopedic  No limitations of motion  feet .  No crepitus or effusions noted.  No bony pathology or digital deformities noted.  Skin  normotropic skin with no porokeratosis noted bilaterally.  No signs of infections or ulcers noted.     Onychomycosis  Pain in right toes  Pain in left toes  Consent was obtained for treatment procedures.   Mechanical debridement of nails 1-5  bilaterally performed with a nail nipper.  Filed with dremel without incident. No infection or ulcer.     Return office visit   3 months        Told patient to return for periodic foot care and evaluation due to potential at risk complications.   Gardiner Barefoot DPM

## 2020-09-05 ENCOUNTER — Other Ambulatory Visit: Payer: Medicare Other

## 2020-09-12 ENCOUNTER — Other Ambulatory Visit: Payer: Medicare Other

## 2020-09-13 ENCOUNTER — Other Ambulatory Visit: Payer: Medicare Other

## 2020-10-18 ENCOUNTER — Other Ambulatory Visit: Payer: Medicare Other

## 2020-10-24 ENCOUNTER — Other Ambulatory Visit: Payer: Self-pay

## 2020-10-24 ENCOUNTER — Other Ambulatory Visit: Payer: Medicare Other | Admitting: *Deleted

## 2020-10-24 DIAGNOSIS — E782 Mixed hyperlipidemia: Secondary | ICD-10-CM

## 2020-10-25 LAB — LIPID PANEL
Chol/HDL Ratio: 3.4 ratio (ref 0.0–5.0)
Cholesterol, Total: 82 mg/dL — ABNORMAL LOW (ref 100–199)
HDL: 24 mg/dL — ABNORMAL LOW (ref >39)
LDL Chol Calc (NIH): 32 mg/dL (ref 0–99)
Triglycerides: 153 mg/dL — ABNORMAL HIGH (ref 0–149)
VLDL Cholesterol Cal: 26 mg/dL (ref 5–40)

## 2020-10-25 LAB — HEPATIC FUNCTION PANEL
ALT: 26 IU/L (ref 0–44)
AST: 26 IU/L (ref 0–40)
Albumin: 4.4 g/dL (ref 3.7–4.7)
Alkaline Phosphatase: 67 IU/L (ref 44–121)
Bilirubin Total: 0.4 mg/dL (ref 0.0–1.2)
Bilirubin, Direct: 0.15 mg/dL (ref 0.00–0.40)
Total Protein: 6.7 g/dL (ref 6.0–8.5)

## 2020-11-01 ENCOUNTER — Other Ambulatory Visit: Payer: Self-pay

## 2020-11-01 ENCOUNTER — Encounter: Payer: Self-pay | Admitting: Physician Assistant

## 2020-11-01 ENCOUNTER — Ambulatory Visit (INDEPENDENT_AMBULATORY_CARE_PROVIDER_SITE_OTHER): Payer: Medicare Other | Admitting: Physician Assistant

## 2020-11-01 VITALS — BP 126/64 | HR 60 | Ht 66.0 in | Wt 208.0 lb

## 2020-11-01 DIAGNOSIS — I2510 Atherosclerotic heart disease of native coronary artery without angina pectoris: Secondary | ICD-10-CM

## 2020-11-01 DIAGNOSIS — E782 Mixed hyperlipidemia: Secondary | ICD-10-CM

## 2020-11-01 DIAGNOSIS — I10 Essential (primary) hypertension: Secondary | ICD-10-CM | POA: Diagnosis not present

## 2020-11-01 DIAGNOSIS — I471 Supraventricular tachycardia: Secondary | ICD-10-CM

## 2020-11-01 NOTE — Patient Instructions (Addendum)
Medication Instructions:  Your physician recommends that you continue on your current medications as directed. Please refer to the Current Medication list given to you today.  *If you need a refill on your cardiac medications before your next appointment, please call your pharmacy*   Lab Work: None ordered  If you have labs (blood work) drawn today and your tests are completely normal, you will receive your results only by: [place] (if you have MyChart) OR A paper copy in the mail If you have any lab test that is abnormal or we need to change your treatment, we will call you to review the results.   Testing/Procedures: None ordered   Follow-Up: At [facility], you and your health needs are our priority.  As part of our continuing mission to provide you with exceptional heart care, we have created designated Provider Care Teams.  These Care Teams include your primary Cardiologist (physician) and Advanced Practice Providers (APPs -  Physician Assistants and Nurse Practitioners) who all work together to provide you with the care you need, when you need it.  We recommend signing up for the patient portal called "MyChart".  Sign up information is provided on this After Visit Summary.  MyChart is used to connect with patients for Virtual Visits (Telemedicine).  Patients are able to view lab/test results, encounter notes, upcoming appointments, etc.  Non-urgent messages can be sent to your provider as well.   To learn more about what you can do with MyChart, go to [email or web address].    Your next appointment:   6 month(s)   05/02/20 ARRIVE AT 1:00  The format for your next appointment:   In Person  Provider:   Gwyndolyn Kaufman, MD   Other Instructions

## 2020-11-01 NOTE — Progress Notes (Signed)
[facility] Office Note:    Date:  11/01/2020   ID:  GRASON BRAILSFORD, DOB [birth year], MRN [id number]  PCP:  Jon Billings, NP   [facility] Providers Cardiologist:  Freada Bergeron, MD    Referring MD: Jon Billings, NP   Chief Complaint:  F/u for SVT, CAD    Patient Profile:   Bobby Lane is a 80 y.o. male with:  Coronary artery disease  Cath 1990 ([facility]): non-obstructive disease  PSVT Rx: Toprol XL COPD  Hypertension  Hyperlipidemia   PCSK9i Rx GERD Gout  Neuropathy Arthritis  Hypothyroidism  Aortic atherosclerosis   Prior CV studies: Echocardiogram 05/19/20 EF 70-75, intracavitary gradient at rest (18 mmHg increases to 36 mmHg with Valsalva),  No RWMA, GR 1 DD, normal RVSF  LONG TERM MONITOR (8-14 DAYS) INTERPRETATION 04/21/2020 Narrative  Patch wear time 13 days and 17 hours  Predominant rhythm is NSR with average HR 70; ranging from 50-187bpm  There were 55 episodes of SVT with longest lasting 13.2 seconds at rate 187bpm (also the fastest episode); did not correlate with patient triggered event.  Isolated SVEs and PVCs were rate  Ventricular bigeminy and trigeminy were present  No Afib, significant pauses or VT  Chest CTA 11/19/2018 IMPRESSION: 1. Very limited pulmonary artery evaluation. No evidence of main or lobar pulmonary embolism. 2. Aortic and coronary atherosclerosis.  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 was last seen by Dr. Johney Frame in 5/22.  He returns for Cardiology f/u.  He is here alone.  He has significant arthritis in his knees and arrives in a  wheelchair.  Since last seen, he has not had significant palpitations.  He has not had chest pain, shortness of breath, syncope, orthopnea, significant leg edema.    Past Medical History:  Diagnosis Date   Arthritis    Cancer ([place])    SKIN   COPD (chronic obstructive pulmonary disease) (HCC)    Coronary artery disease    Depression    Dyspnea    DOE   GERD (gastroesophageal reflux disease)    Gout    Heart murmur    History of hiatal hernia    History of kidney stones    History of wheezing    HOH (hard of hearing)    Hypertension    Hypothyroidism    Oxygen deficit    PRN USE; used ~ 2014 for less than a year (no longer has as of 07/15/19)   Pneumonia    Renal disorder    Thyroid disease    Current Medications: Current Meds  Medication Sig   acetaminophen (TYLENOL) 650 MG CR tablet Take 1,300 mg by mouth every 8 (eight) hours as needed for pain.   allopurinol (ZYLOPRIM) 300 MG tablet Take by mouth.   Cholecalciferol (VITAMIN D3) 5000 UNITS TABS Take 5,000 Units by mouth daily.  clonazePAM (KLONOPIN) 0.5 MG tablet Take 0.5 mg by mouth at bedtime as needed.   diclofenac Sodium (VOLTAREN) 1 % GEL diclofenac 1 % topical gel   DULoxetine (CYMBALTA) 30 MG capsule Take 30 mg by mouth 2 (two) times daily.   finasteride (PROSCAR) 5 MG tablet Take 1 tablet (5 mg total) by mouth daily.   levothyroxine (SYNTHROID) 100 MCG tablet Take 100 mcg by mouth daily before breakfast.   losartan (COZAAR) 100 MG tablet Take 1 tablet (100 mg total) by mouth daily.   REPATHA SURECLICK 939 MG/ML SOAJ Inject 1 mL into the skin every 14 (fourteen) days.   spironolactone (ALDACTONE) 25 MG tablet Take 12.5 mg by mouth every other day.   tamsulosin (FLOMAX) 0.4 MG CAPS capsule Take 1 capsule (0.4 mg total) by mouth daily.   TOPROL XL 100 MG 24 hr tablet Take 1 tablet (100 mg total) by mouth daily. Take with or immediately following a meal.    Allergies:   Codeine, Morphine and related, Novocain  [procaine], Other, Percocet [oxycodone-acetaminophen], Sulfa antibiotics, Acetaminophen, Atorvastatin, Rosuvastatin, and Tramadol   Social History   Tobacco Use   Smoking status: Never   Smokeless tobacco: Never  Vaping Use   Vaping Use: Never used  Substance Use Topics   Alcohol use: No   Drug use: No    Family Hx: The patient's family history includes Brain cancer in his father. There is no history of Prostate cancer, Bladder Cancer, or Kidney cancer.  Review of Systems  Gastrointestinal:  Negative for hematochezia.  Genitourinary:  Negative for hematuria.    EKGs/Labs/Other Test Reviewed:    EKG:  EKG is not ordered today.  The ekg ordered today demonstrates n/a  Recent Labs: 10/24/2020: ALT 26   Recent Lipid Panel Lab Results  Component Value Date/Time   CHOL 82 (L) 10/24/2020 02:25 PM   TRIG 153 (H) 10/24/2020 02:25 PM   HDL 24 (L) 10/24/2020 02:25 PM   LDLCALC 32 10/24/2020 02:25 PM     Risk Assessment/Calculations:          Physical Exam:    VS:  BP 126/64   Pulse 60   Ht 5\' 6"  (1.676 m)   Wt 208 lb (94.3 kg)   SpO2 96%   BMI 33.57 kg/m     Wt Readings from Last 3 Encounters:  11/01/20 208 lb (94.3 kg)  07/04/20 211 lb (95.7 kg)  03/25/20 202 lb (91.6 kg)    Constitutional:      Appearance: Healthy appearance. Not in distress.     Comments: Arrives in a wheelchair  Eyes:     Comments: Xanthelasma L lower eyelid (nasal)  Pulmonary:     Effort: Pulmonary effort is normal.     Breath sounds: No wheezing. No rales.  Cardiovascular:     Normal rate. Regular rhythm. Normal S1. Normal S2.      Murmurs: There is no murmur.  Edema:    Peripheral edema present.    Pretibial: bilateral trace edema of the pretibial area. Abdominal:     Palpations: Abdomen is soft.  Musculoskeletal:     Cervical back: Neck supple. Skin:    General: Skin is warm and dry.  Neurological:     General: No focal deficit present.     Mental Status: Alert and oriented to  person, place and time.     Cranial Nerves: Cranial nerves are intact.       ASSESSMENT & PLAN:   1. Paroxysmal SVT (  supraventricular tachycardia) ([place]) Several episodes of SVT noted on event monitor in 3/22.  He has not had any recurrent palpitations.  He is tolerating current beta-blocker therapy.  He remains on metoprolol succinate 100 mg daily.  No change in therapy at this time.  2. Coronary artery disease involving native coronary artery of native heart without angina pectoris Nonobstructive disease by cardiac catheterization in the past.  He is not having anginal symptoms.  3. Essential hypertension Blood pressure is well controlled on a combination of losartan, metoprolol succinate and spironolactone.  4. Mixed hyperlipidemia LDL is now optimal on evolocumab 140 mg twice monthly.       Dispo:  Return in about 6 months (around 05/01/2021) for Routine Follow Up, w/ Dr. Johney Frame.   Medication Adjustments/Labs and Tests Ordered: Current medicines are reviewed at length with the patient today.  Concerns regarding medicines are outlined above.  Tests Ordered: No orders of the defined types were placed in this encounter.  Medication Changes: No orders of the defined types were placed in this encounter.  Signed, Richardson Dopp, PA-C  11/01/2020 2:03 PM    [facility] [address], [place], [place]  [address] Phone: [phone]; Fax: [phone]

## 2020-11-10 ENCOUNTER — Ambulatory Visit: Payer: Medicare Other | Admitting: Podiatry

## 2021-01-30 ENCOUNTER — Ambulatory Visit: Payer: Medicare Other | Admitting: Podiatry

## 2021-02-09 ENCOUNTER — Ambulatory Visit: Payer: Medicare Other | Admitting: Podiatry

## 2021-03-16 ENCOUNTER — Encounter: Payer: Self-pay | Admitting: Podiatrist

## 2021-03-16 ENCOUNTER — Other Ambulatory Visit: Payer: Self-pay

## 2021-03-16 ENCOUNTER — Ambulatory Visit (INDEPENDENT_AMBULATORY_CARE_PROVIDER_SITE_OTHER): Payer: Medicare Other | Admitting: Podiatrist

## 2021-03-16 DIAGNOSIS — B351 Tinea unguium: Secondary | ICD-10-CM | POA: Diagnosis not present

## 2021-03-16 DIAGNOSIS — M79674 Pain in right toe(s): Secondary | ICD-10-CM | POA: Diagnosis not present

## 2021-03-16 DIAGNOSIS — M79675 Pain in left toe(s): Secondary | ICD-10-CM

## 2021-03-16 NOTE — Progress Notes (Signed)
°  Chief Complaint  Patient presents with   Debridement    Trim toenails    This patient returns to my office for at risk foot care.  This patient requires this care by a professional since this patient will be at risk due to having renal disease .These nails are painful walking and wearing shoes.  This patient presents for at risk foot care today.    General Appearance  Alert, conversant and in no acute stress.   Vascular  Dorsalis pedis and posterior tibial  pulses are not  palpable  bilaterally.  Capillary return is within normal limits  bilaterally. Temperature is within normal limits  bilaterally.   Neurologic  Senn-Weinstein monofilament wire test within normal limits  bilaterally. Muscle power within normal limits bilaterally.   Nails Thick disfigured discolored nails with subungual debris  from hallux to fifth toes bilaterally. No evidence of bacterial infection or drainage bilaterally.   Orthopedic  No limitations of motion  feet .  No crepitus or effusions noted.  No bony pathology or digital deformities noted.   Skin  normotropic skin with no porokeratosis noted bilaterally.  No signs of infections or ulcers noted.      Onychomycosis  Pain in right toes  Pain in left toes   Consent was obtained for treatment procedures.   Mechanical debridement of nails 1-5  bilaterally performed with a nail nipper.  Filed with dremel without incident. No infection or ulcer.       Return office visit   3 months        Told patient to return for periodic foot care and evaluation due to potential at risk complications.

## 2021-04-17 ENCOUNTER — Other Ambulatory Visit: Payer: Self-pay

## 2021-04-17 ENCOUNTER — Encounter (INDEPENDENT_AMBULATORY_CARE_PROVIDER_SITE_OTHER): Payer: Self-pay | Admitting: Vascular Surgery

## 2021-04-17 ENCOUNTER — Other Ambulatory Visit (INDEPENDENT_AMBULATORY_CARE_PROVIDER_SITE_OTHER): Payer: Self-pay | Admitting: Vascular Surgery

## 2021-04-17 ENCOUNTER — Ambulatory Visit (INDEPENDENT_AMBULATORY_CARE_PROVIDER_SITE_OTHER): Payer: Medicare Other

## 2021-04-17 ENCOUNTER — Ambulatory Visit (INDEPENDENT_AMBULATORY_CARE_PROVIDER_SITE_OTHER): Payer: Medicare Other | Admitting: Vascular Surgery

## 2021-04-17 VITALS — BP 145/81 | HR 64 | Ht 65.0 in

## 2021-04-17 DIAGNOSIS — I10 Essential (primary) hypertension: Secondary | ICD-10-CM | POA: Diagnosis not present

## 2021-04-17 DIAGNOSIS — I739 Peripheral vascular disease, unspecified: Secondary | ICD-10-CM

## 2021-04-17 DIAGNOSIS — I70213 Atherosclerosis of native arteries of extremities with intermittent claudication, bilateral legs: Secondary | ICD-10-CM | POA: Diagnosis not present

## 2021-04-17 DIAGNOSIS — I2510 Atherosclerotic heart disease of native coronary artery without angina pectoris: Secondary | ICD-10-CM

## 2021-04-17 DIAGNOSIS — I70219 Atherosclerosis of native arteries of extremities with intermittent claudication, unspecified extremity: Secondary | ICD-10-CM | POA: Insufficient documentation

## 2021-04-17 DIAGNOSIS — I480 Paroxysmal atrial fibrillation: Secondary | ICD-10-CM

## 2021-04-17 DIAGNOSIS — E7800 Pure hypercholesterolemia, unspecified: Secondary | ICD-10-CM

## 2021-04-17 NOTE — Progress Notes (Signed)
MRN : [id number]  Bobby Lane is a 81 y.o. (07-21-1940) male who presents with chief complaint of check circulation.  History of Present Illness:   The patient is seen for evaluation of painful lower extremities. Patient notes the pain is variable and not always associated with activity.  The pain is somewhat consistent day to day occurring on most days. The patient notes the pain also occurs with standing and routinely seems worse as the day wears on. The pain has been progressive over the past several years. The patient states these symptoms are causing  a profound negative impact on quality of life and daily activities.  He is mostly in a wheelchair and lift chair every day and he ambulates with two canes.  The patient denies rest pain or dangling of an extremity off the side of the bed during the night for relief. No open wounds or sores at this time. No history of DVT or phlebitis. No prior interventions or surgeries.  There is a  history of back problems and DJD of the lumbar and sacral spine.    ABI Rt=1.05 and Lt=1.04  No outpatient medications have been marked as taking for the 04/17/21 encounter (Appointment) with Delana Meyer, Dolores Lory, MD.    Past Medical History:  Diagnosis Date   Arthritis    Cancer ([place])    SKIN   COPD (chronic obstructive pulmonary disease) ([place])    Coronary artery disease    Depression    Dyspnea    DOE   GERD (gastroesophageal reflux disease)    Gout    Heart murmur    History of hiatal hernia    History of kidney stones    History of wheezing    HOH (hard of hearing)    Hypertension    Hypothyroidism    Oxygen deficit    PRN USE; used ~ 2014 for less than a year (no longer has as of 07/15/19)   Pneumonia    Renal disorder    Thyroid disease     Past Surgical History:  Procedure Laterality Date   ANTERIOR CERVICAL DECOMP/DISCECTOMY FUSION N/A 07/16/2019   Procedure: Cervical three-four Cervical five-six Cervical six-seven Anterior  cervical decompression/discectomy/fusion;  Surgeon: Erline Levine, MD;  Location: [facility];  Service: Neurosurgery;  Laterality: N/A;  3C   CARDIAC CATHETERIZATION     CARPAL TUNNEL RELEASE Left 07/16/2019   Procedure: LEFT CARPAL TUNNEL RELEASE;  Surgeon: Erline Levine, MD;  Location: [facility];  Service: Neurosurgery;  Laterality: Left;   CATARACT EXTRACTION W/PHACO Right 08/21/2016   Procedure: CATARACT EXTRACTION PHACO AND INTRAOCULAR LENS PLACEMENT (IOC);  Surgeon: Birder Robson, MD;  Location: [facility];  Service: Ophthalmology;  Laterality: Right;  [place] 00:54.9 AP% 18.3 CDE 10.04 Fluid pack lot # [id number]   CATARACT EXTRACTION W/PHACO Left 09/18/2016   Procedure: CATARACT EXTRACTION PHACO AND INTRAOCULAR LENS PLACEMENT (IOC);  Surgeon: Birder Robson, MD;  Location: [facility];  Service: Ophthalmology;  Laterality: Left;  [place] 00:56 AP% 19.7 CDE 11.07 Fluid pack lot # [id number]   FRACTURE SURGERY     ANKLE   HERNIA REPAIR     UPPER GI ENDOSCOPY      Social History Social History   Tobacco Use   Smoking status: Never   Smokeless tobacco: Never  Vaping Use   Vaping Use: Never used  Substance Use Topics   Alcohol use: No   Drug use: No    Family History Family History  Problem Relation Age of  Onset   Brain cancer Father    Prostate cancer Neg Hx    Bladder Cancer Neg Hx    Kidney cancer Neg Hx     Allergies  Allergen Reactions   Codeine Nausea And Vomiting   Morphine And Related Nausea And Vomiting   Novocain [Procaine] Hives    NO TROUBLE IN OR   Other Nausea And Vomiting   Percocet [Oxycodone-Acetaminophen] Nausea And Vomiting   Sulfa Antibiotics Hives   Acetaminophen    Atorvastatin    Rosuvastatin    Tramadol Nausea And Vomiting    Note: upset stomach Note: upset stomach      REVIEW OF SYSTEMS (Negative unless checked)  Constitutional: '[]'$ Weight loss  '[]'$ Fever  '[]'$ Chills Cardiac: '[]'$ Chest pain   '[]'$ Chest pressure   '[]'$ Palpitations   '[]'$ Shortness of breath when  laying flat   '[]'$ Shortness of breath with exertion. Vascular:  '[x]'$ Pain in legs with walking   '[x]'$ Pain in legs at rest  '[]'$ History of DVT   '[]'$ Phlebitis   '[x]'$ Swelling in legs   '[]'$ Varicose veins   '[]'$ Non-healing ulcers Pulmonary:   '[]'$ Uses home oxygen   '[]'$ Productive cough   '[]'$ Hemoptysis   '[]'$ Wheeze  '[]'$ COPD   '[]'$ Asthma Neurologic:  '[]'$ Dizziness   '[]'$ Seizures   '[]'$ History of stroke   '[]'$ History of TIA  '[]'$ Aphasia   '[]'$ Vissual changes   '[]'$ Weakness or numbness in arm   '[x]'$ Weakness or numbness in leg Musculoskeletal:   '[]'$ Joint swelling   '[x]'$ Joint pain   '[x]'$ Low back pain Hematologic:  '[]'$ Easy bruising  '[]'$ Easy bleeding   '[]'$ Hypercoagulable state   '[]'$ Anemic Gastrointestinal:  '[]'$ Diarrhea   '[]'$ Vomiting  '[]'$ Gastroesophageal reflux/heartburn   '[]'$ Difficulty swallowing. Genitourinary:  '[]'$ Chronic kidney disease   '[]'$ Difficult urination  '[]'$ Frequent urination   '[]'$ Blood in urine Skin:  '[]'$ Rashes   '[]'$ Ulcers  Psychological:  '[]'$ History of anxiety   '[]'$  History of major depression.  Physical Examination  There were no vitals filed for this visit. There is no height or weight on file to calculate BMI. Gen: WD/WN, NAD Head: [place]/AT, No temporalis wasting.  Ear/Nose/Throat: Hearing grossly intact, nares w/o erythema or drainage Eyes: PER, EOMI, sclera nonicteric.  Neck: Supple, no masses.  No bruit or JVD.  Pulmonary:  Good air movement, no audible wheezing, no use of accessory muscles.  Cardiac: RRR, normal S1, S2, no Murmurs. Vascular:  scattered varicosities present bilaterally.  Mild venous stasis changes to the legs bilaterally.  2+ soft pitting edema  Vessel Right Left  Radial Palpable Palpable  PT Not Palpable Not Palpable  DP Not Palpable Not Palpable  Gastrointestinal: soft, non-distended. No guarding/no peritoneal signs.  Musculoskeletal: M/S 5/5 throughout.  No visible deformity.  Neurologic: CN 2-12 intact. Pain and light touch intact in extremities.  Symmetrical.  Speech is fluent. Motor exam as listed above. Psychiatric:  Judgment intact, Mood & affect appropriate for pt's clinical situation. Dermatologic: No rashes or ulcers noted.  No changes consistent with cellulitis.   CBC Lab Results  Component Value Date   WBC 7.5 07/14/2019   HGB 13.3 07/14/2019   HCT 40.8 07/14/2019   MCV 105.4 (H) 07/14/2019   PLT 210 07/14/2019    BMET    Component Value Date/Time   NA 139 07/14/2019 1353   NA 142 01/05/2019 1557   NA 141 09/02/2013 2101   K 4.8 07/14/2019 1353   K 4.5 09/02/2013 2101   CL 103 07/14/2019 1353   CL 105 09/02/2013 2101   CO2 25 07/14/2019 1353   CO2 29 09/02/2013 2101  GLUCOSE 90 07/14/2019 1353   GLUCOSE 89 09/02/2013 2101   BUN 28 (H) 07/14/2019 1353   BUN 23 01/05/2019 1557   BUN 30 (H) 09/02/2013 2101   CREATININE 1.56 (H) 07/14/2019 1353   CREATININE 1.53 (H) 09/02/2013 2101   CALCIUM 9.7 07/14/2019 1353   CALCIUM 9.9 09/02/2013 2101   GFRNONAA 42 (L) 07/14/2019 1353   GFRNONAA 45 (L) 09/02/2013 2101   GFRAA 49 (L) 07/14/2019 1353   GFRAA 52 (L) 09/02/2013 2101   CrCl cannot be calculated (Patient's most recent lab result is older than the maximum 21 days allowed.).  COAG Lab Results  Component Value Date   INR 1.2 11/13/2018    Radiology No results found.   Assessment/Plan 1. Atherosclerosis of native artery of both lower extremities with intermittent claudication (HCC)  Recommend:  The patient has evidence of atherosclerosis of the lower extremities with claudication.  The patient does not voice lifestyle limiting changes at this point in time.  Noninvasive studies do not suggest critical ischemia.  No invasive studies, angiography or surgery at this time The patient should continue walking and begin a more formal exercise program.  The patient should continue antiplatelet therapy and aggressive treatment of the lipid abnormalities  No changes in the patient's medications at this time  The patient should continue wearing graduated compression socks  10-15 mmHg strength to control the mild edema.  Also I showed him a lymph pump but he is not interested  at this time.  He is hoping his primary will put him back on spironolactone which he feels worded really well.  - VAS [place] ABI WITH/WO TBI; Future  2. Paroxysmal atrial fibrillation (HCC) Continue antiarrhythmia medications as already ordered, these medications have been reviewed and there are no changes at this time.  Continue anticoagulation as ordered by Cardiology Service   3. Primary hypertension Continue antihypertensive medications as already ordered, these medications have been reviewed and there are no changes at this time.   4. Coronary artery disease involving native heart without angina pectoris, unspecified vessel or lesion type Continue cardiac and antihypertensive medications as already ordered and reviewed, no changes at this time.  Continue statin as ordered and reviewed, no changes at this time  Nitrates PRN for chest pain   5. Hypercholesterolemia Continue statin as ordered and reviewed, no changes at this time     Hortencia Pilar, MD  04/17/2021 2:17 PM

## 2021-04-30 NOTE — Progress Notes (Deleted)
?Cardiology Office Note:   ? ?Date:  04/30/2021  ? ?ID:  Bobby Lane, DOB [birth year], MRN [id number] ? ?PCP:  Jon Billings, NP  ?[facility] Cardiologist:  Freada Bergeron, MD  ?[facility] Electrophysiologist:  None  ? ?Referring MD: Jon Billings, NP  ? ? ? ?History of Present Illness:   ? ?Bobby Lane is a 81 y.o. male with a hx of COPD, CAD, gout, HTN, and GERD who presents to clinic for follow-up. ? ?Patient initially saw me on 03/25/20 for palpitations that he developed at the end of Jan 2022. Specifically, he felt a pounding in his chest. Had some lightheadedness with this. Tried to rest and the symptoms eventually improved. He went to [facility] a couple days later where medical students were checking people's heart rates and he was told his heart rate was 140s and his blood pressure was elevated. They wanted to call EMS but he declined. He presented to clinic on 03/25/20 where cardiac monitor was performed on 04/21/20 which showed 55 episodes of SVT with longest lasting 13 seconds, no Afib or sustained arrhythmias. TTE 05/19/20 hyperdynamic LV systolic function with intracavitary gradient, G1DD, no significant valve disease. ? ?Last seen 10/2020 by Richardson Dopp, PA where he was having significant joint pain but was stable from a CV perspective. ? ?Today, *** ? ? ?Past Medical History:  ?Diagnosis Date  ? Arthritis   ? Cancer [facility])   ? SKIN  ? COPD (chronic obstructive pulmonary disease) ([place])   ? Coronary artery disease   ? Depression   ? Dyspnea   ? DOE  ? GERD (gastroesophageal reflux disease)   ? Gout   ? Heart murmur   ? History of hiatal hernia   ? History of kidney stones   ? History of wheezing   ? HOH (hard of hearing)   ? Hypertension   ? Hypothyroidism   ? Oxygen deficit   ? PRN USE; used ~ 2014 for less than a year (no longer has as of 07/15/19)  ? Pneumonia   ? Renal disorder   ? Thyroid disease   ? ? ?Past Surgical History:  ?Procedure Laterality Date  ? ANTERIOR CERVICAL  DECOMP/DISCECTOMY FUSION N/A 07/16/2019  ? Procedure: Cervical three-four Cervical five-six Cervical six-seven Anterior cervical decompression/discectomy/fusion;  Surgeon: Erline Levine, MD;  Location: [place];  Service: Neurosurgery;  Laterality: N/A;  3C  ? CARDIAC CATHETERIZATION    ? CARPAL TUNNEL RELEASE Left 07/16/2019  ? Procedure: LEFT CARPAL TUNNEL RELEASE;  Surgeon: Erline Levine, MD;  Location: [place];  Service: Neurosurgery;  Laterality: Left;  ? CATARACT EXTRACTION W/PHACO Right 08/21/2016  ? Procedure: CATARACT EXTRACTION PHACO AND INTRAOCULAR LENS PLACEMENT (IOC);  Surgeon: Birder Robson, MD;  Location: [facility];  Service: Ophthalmology;  Laterality: Right;  [place] 00:54.9 ?AP% 18.3 ?CDE 10.04 ?Fluid pack lot # [id number]  ? CATARACT EXTRACTION W/PHACO Left 09/18/2016  ? Procedure: CATARACT EXTRACTION PHACO AND INTRAOCULAR LENS PLACEMENT (IOC);  Surgeon: Birder Robson, MD;  Location: [facility];  Service: Ophthalmology;  Laterality: Left;  [place] 00:56 ?AP% 19.7 ?CDE 11.07 ?Fluid pack lot # [id number]  ? FRACTURE SURGERY    ? ANKLE  ? HERNIA REPAIR    ? UPPER GI ENDOSCOPY    ? ? ?Current Medications: ?No outpatient medications have been marked as taking for the 05/02/21 encounter (Appointment) with Freada Bergeron, MD.  ?  ? ?Allergies:   Codeine, Morphine and related, Novocain [procaine], Other, Percocet [oxycodone-acetaminophen], Sulfa antibiotics, Acetaminophen,  Atorvastatin, Rosuvastatin, and Tramadol  ? ?Social History  ? ?Socioeconomic History  ? Marital status: Single  ?  Spouse name: Not on file  ? Number of children: Not on file  ? Years of education: Not on file  ? Highest education level: Not on file  ?Occupational History  ? Not on file  ?Tobacco Use  ? Smoking status: Never  ? Smokeless tobacco: Never  ?Vaping Use  ? Vaping Use: Never used  ?Substance and Sexual Activity  ? Alcohol use: No  ? Drug use: No  ? Sexual activity: Not on file  ?Other Topics Concern  ? Not on file  ?Social History Narrative   ? Not on file  ? ?Social Determinants of Health  ? ?Financial Resource Strain: Not on file  ?Food Insecurity: Not on file  ?Transportation Needs: Not on file  ?Physical Activity: Not on file  ?Stress: Not on file  ?Social Connections: Not on file  ?  ? ?Family History: ?The patient's family history includes Brain cancer in his father. There is no history of Prostate cancer, Bladder Cancer, or Kidney cancer. ? ?ROS:   ?Please see the history of present illness.    ?Review of Systems  ?Constitutional:  Positive for malaise/fatigue. Negative for chills and fever.  ?HENT:  Negative for nosebleeds.   ?Eyes:  Negative for blurred vision and redness.  ?Respiratory:  Negative for shortness of breath.   ?Cardiovascular:  Positive for palpitations. Negative for chest pain, orthopnea, claudication, leg swelling and PND.  ?Gastrointestinal:  Negative for nausea and vomiting.  ?Genitourinary:  Negative for dysuria and hematuria.  ?Musculoskeletal:  Negative for joint pain.  ?Neurological:  Positive for headaches. Negative for dizziness, speech change and loss of consciousness.  ?Endo/Heme/Allergies:  Negative for polydipsia.  ? ?EKGs/Labs/Other Studies Reviewed:   ? ?The following studies were reviewed today: ?TTE 05/19/20: ?IMPRESSIONS  ? ? ? 1. Intracavitary gradient at rest. Peak velocity 1.87 m/s. Peak gradient  ?14 mmHg. Peak gradient increases to 36 mmHg and peak velocity increases to  ?3 m/s with Valsalva. Left ventricular ejection fraction, by estimation, is  ?70 to 75%. The left ventricle  ?has hyperdynamic function. The left ventricle has no regional wall motion  ?abnormalities. Left ventricular diastolic parameters are consistent with  ?Grade I diastolic dysfunction (impaired relaxation).  ? 2. Right ventricular systolic function is normal. The right ventricular  ?size is normal.  ? 3. The mitral valve is normal in structure. No evidence of mitral valve  ?regurgitation. No evidence of mitral stenosis.  ? 4. The  aortic valve is normal in structure. Aortic valve regurgitation is  ?not visualized. No aortic stenosis is present.  ? 5. The inferior vena cava is normal in size with greater than 50%  ?respiratory variability, suggesting right atrial pressure of 3 mmHg.  ? ?Comparison(s): 06/23/13 EF 60-65%.  ? ?Cardiac Monitor 04/21/20: ?Patch wear time 13 days and 17 hours ?Predominant rhythm is NSR with average HR 70; ranging from 50-187bpm ?There were 55 episodes of SVT with longest lasting 13.2 seconds at rate 187bpm (also the fastest episode); did not correlate with patient triggered event. ?Isolated SVEs and PVCs were rate ?Ventricular bigeminy and trigeminy were present ?No Afib, significant pauses or VT ?  ?  ?Patch Wear Time:  13 days and 17 hours (2022-02-17T15:00:04-498 to 2022-03-03T08:36:54-0500) ?  ?Patient had a min HR of 50 bpm, max HR of 187 bpm, and avg HR of 70 bpm. Predominant underlying rhythm was Sinus Rhythm. [place]  Supraventricular Tachycardia runs occurred, the run with the fastest interval lasting 13.2 secs with a max rate of 187 bpm (avg  ?149 bpm); the run with the fastest interval was also the longest. Idioventricular Rhythm was present. Ectopic Atrial Rhythm was present. Isolated SVEs were rare (<1.0%), SVE Couplets were rare (<1.0%), and SVE Triplets were rare (<1.0%). Isolated VEs  ?were rare (<1.0%, 5778), VE Couplets were rare (<1.0%, 36), and VE Triplets were rare (<1.0%, 1). Ventricular Bigeminy and Trigeminy were present.  ? ? ?TTE 06/23/13: ?------------------------------------------------------------  ?Left ventricle:  The cavity size was normal. Wall thickness  ?was increased in a pattern of mild LVH. There was focal  ?basal hypertrophy. Systolic function was normal. The  ?estimated ejection fraction was in the range of 60% to 65%.  ?Wall motion was normal; there were no regional wall motion  ?abnormalities. Early diastolic septal annular tissue Doppler  ?velocities Ea were abnormal.   ? ?------------------------------------------------------------  ?Aortic valve:   Mildly to moderately calcified annulus.  ?Doppler:   There was no stenosis.    No regurgitation.  ? ?-------------------------------------

## 2021-05-01 NOTE — Progress Notes (Incomplete)
?Cardiology Office Note:   ? ?Date:  05/01/2021  ? ?ID:  Bobby Lane, DOB Nov 08, 1940, MRN [id number] ? ?PCP:  Jon Billings, NP  ?[facility] Cardiologist:  Freada Bergeron, MD  ?[facility] Electrophysiologist:  None  ? ?Referring MD: Jon Billings, NP  ? ?History of Present Illness:   ? ?Bobby Lane is a 81 y.o. male with a hx of COPD, CAD, gout, HTN, and GERD who presents to clinic for follow-up. ? ?Patient initially saw me on 03/25/20 for palpitations that he developed at the end of Jan 2022. Specifically, he felt a pounding in his chest. Had some lightheadedness with this. Tried to rest and the symptoms eventually improved. He went to [facility] a couple days later where medical students were checking people's heart rates and he was told his heart rate was 140s and his blood pressure was elevated. They wanted to call EMS but he declined. He presented to clinic on 03/25/20 where cardiac monitor was performed on 04/21/20 which showed 55 episodes of SVT with longest lasting 13 seconds, no Afib or sustained arrhythmias. TTE 05/19/20 hyperdynamic LV systolic function with intracavitary gradient, G1DD, no significant valve disease. ? ?Last seen 10/2020 by Richardson Dopp, PA where he was having significant joint pain but was stable from a CV perspective. ? ?Today, *** ? ?The patient denies chest pain, chest pressure, dyspnea at rest or with exertion, palpitations, PND, orthopnea, or leg swelling. Denies cough, fever, chills. Denies nausea, vomiting. Denies syncope or presyncope. Denies dizziness or lightheadedness. Denies snoring. ? ?Past Medical History:  ?Diagnosis Date  ? Arthritis   ? Cancer [facility])   ? SKIN  ? COPD (chronic obstructive pulmonary disease) ([place])   ? Coronary artery disease   ? Depression   ? Dyspnea   ? DOE  ? GERD (gastroesophageal reflux disease)   ? Gout   ? Heart murmur   ? History of hiatal hernia   ? History of kidney stones   ? History of wheezing   ? HOH (hard of hearing)   ?  Hypertension   ? Hypothyroidism   ? Oxygen deficit   ? PRN USE; used ~ 2014 for less than a year (no longer has as of 07/15/19)  ? Pneumonia   ? Renal disorder   ? Thyroid disease   ? ? ?Past Surgical History:  ?Procedure Laterality Date  ? ANTERIOR CERVICAL DECOMP/DISCECTOMY FUSION N/A 07/16/2019  ? Procedure: Cervical three-four Cervical five-six Cervical six-seven Anterior cervical decompression/discectomy/fusion;  Surgeon: Erline Levine, MD;  Location: [place];  Service: Neurosurgery;  Laterality: N/A;  3C  ? CARDIAC CATHETERIZATION    ? CARPAL TUNNEL RELEASE Left 07/16/2019  ? Procedure: LEFT CARPAL TUNNEL RELEASE;  Surgeon: Erline Levine, MD;  Location: [place];  Service: Neurosurgery;  Laterality: Left;  ? CATARACT EXTRACTION W/PHACO Right 08/21/2016  ? Procedure: CATARACT EXTRACTION PHACO AND INTRAOCULAR LENS PLACEMENT (IOC);  Surgeon: Birder Robson, MD;  Location: [facility];  Service: Ophthalmology;  Laterality: Right;  [place] 00:54.9 ?AP% 18.3 ?CDE 10.04 ?Fluid pack lot # [id number]  ? CATARACT EXTRACTION W/PHACO Left 09/18/2016  ? Procedure: CATARACT EXTRACTION PHACO AND INTRAOCULAR LENS PLACEMENT (IOC);  Surgeon: Birder Robson, MD;  Location: [facility];  Service: Ophthalmology;  Laterality: Left;  [place] 00:56 ?AP% 19.7 ?CDE 11.07 ?Fluid pack lot # [id number]  ? FRACTURE SURGERY    ? ANKLE  ? HERNIA REPAIR    ? UPPER GI ENDOSCOPY    ? ? ?Current Medications: ?No outpatient medications  have been marked as taking for the 05/02/21 encounter (Appointment) with Freada Bergeron, MD.  ?  ? ?Allergies:   Codeine, Morphine and related, Novocain [procaine], Other, Percocet [oxycodone-acetaminophen], Sulfa antibiotics, Acetaminophen, Atorvastatin, Rosuvastatin, and Tramadol  ? ?Social History  ? ?Socioeconomic History  ? Marital status: Single  ?  Spouse name: Not on file  ? Number of children: Not on file  ? Years of education: Not on file  ? Highest education level: Not on file  ?Occupational History  ? Not on file  ?Tobacco  Use  ? Smoking status: Never  ? Smokeless tobacco: Never  ?Vaping Use  ? Vaping Use: Never used  ?Substance and Sexual Activity  ? Alcohol use: No  ? Drug use: No  ? Sexual activity: Not on file  ?Other Topics Concern  ? Not on file  ?Social History Narrative  ? Not on file  ? ?Social Determinants of Health  ? ?Financial Resource Strain: Not on file  ?Food Insecurity: Not on file  ?Transportation Needs: Not on file  ?Physical Activity: Not on file  ?Stress: Not on file  ?Social Connections: Not on file  ?  ? ?Family History: ?The patient's family history includes Brain cancer in his father. There is no history of Prostate cancer, Bladder Cancer, or Kidney cancer. ? ?ROS:   ?Please see the history of present illness.    ?Review of Systems  ?Constitutional:  Negative for chills, fever and malaise/fatigue.  ?HENT:  Negative for nosebleeds and sore throat.   ?Eyes:  Negative for blurred vision and redness.  ?Respiratory:  Negative for cough and shortness of breath.   ?Cardiovascular:  Negative for chest pain, palpitations, orthopnea, claudication, leg swelling and PND.  ?Gastrointestinal:  Negative for nausea and vomiting.  ?Genitourinary:  Negative for dysuria and hematuria.  ?Musculoskeletal:  Negative for joint pain and myalgias.  ?Skin:  Negative for itching and rash.  ?Neurological:  Negative for dizziness, speech change, loss of consciousness and headaches.  ?Endo/Heme/Allergies:  Does not bruise/bleed easily.  ?Psychiatric/Behavioral:  The patient is not nervous/anxious and does not have insomnia.   ? ?EKGs/Labs/Other Studies Reviewed:   ? ?The following studies were reviewed today: ?TTE 05/19/20: ?IMPRESSIONS  ? 1. Intracavitary gradient at rest. Peak velocity 1.87 m/s. Peak gradient  ?14 mmHg. Peak gradient increases to 36 mmHg and peak velocity increases to  ?3 m/s with Valsalva. Left ventricular ejection fraction, by estimation, is  ?70 to 75%. The left ventricle  ?has hyperdynamic function. The left  ventricle has no regional wall motion  ?abnormalities. Left ventricular diastolic parameters are consistent with  ?Grade I diastolic dysfunction (impaired relaxation).  ? 2. Right ventricular systolic function is normal. The right ventricular  ?size is normal.  ? 3. The mitral valve is normal in structure. No evidence of mitral valve  ?regurgitation. No evidence of mitral stenosis.  ? 4. The aortic valve is normal in structure. Aortic valve regurgitation is  ?not visualized. No aortic stenosis is present.  ? 5. The inferior vena cava is normal in size with greater than 50%  ?respiratory variability, suggesting right atrial pressure of 3 mmHg.  ? ?Comparison(s): 06/23/13 EF 60-65%.  ? ?Cardiac Monitor 04/21/20: ?Patch wear time 13 days and 17 hours ?Predominant rhythm is NSR with average HR 70; ranging from 50-187bpm ?There were 55 episodes of SVT with longest lasting 13.2 seconds at rate 187bpm (also the fastest episode); did not correlate with patient triggered event. ?Isolated SVEs and PVCs were rate ?  Ventricular bigeminy and trigeminy were present ?No Afib, significant pauses or VT ?  ?  ?Patch Wear Time:  13 days and 17 hours (2022-02-17T15:00:04-498 to 2022-03-03T08:36:54-0500) ?  ?Patient had a min HR of 50 bpm, max HR of 187 bpm, and avg HR of 70 bpm. Predominant underlying rhythm was Sinus Rhythm. 55 Supraventricular Tachycardia runs occurred, the run with the fastest interval lasting 13.2 secs with a max rate of 187 bpm (avg  ?149 bpm); the run with the fastest interval was also the longest. Idioventricular Rhythm was present. Ectopic Atrial Rhythm was present. Isolated SVEs were rare (<1.0%), SVE Couplets were rare (<1.0%), and SVE Triplets were rare (<1.0%). Isolated VEs  ?were rare (<1.0%, 5778), VE Couplets were rare (<1.0%, 36), and VE Triplets were rare (<1.0%, 1). Ventricular Bigeminy and Trigeminy were present.  ? ? ?TTE 06/23/13: ?------------------------------------------------------------  ?Left  ventricle:  The cavity size was normal. Wall thickness  ?was increased in a pattern of mild LVH. There was focal  ?basal hypertrophy. Systolic function was normal. The  ?estimated ejection fraction was in the ran

## 2021-05-02 ENCOUNTER — Ambulatory Visit: Payer: Medicare Other | Admitting: Cardiology

## 2021-06-14 ENCOUNTER — Ambulatory Visit: Payer: Medicare Other | Admitting: Podiatry

## 2021-10-17 ENCOUNTER — Other Ambulatory Visit (INDEPENDENT_AMBULATORY_CARE_PROVIDER_SITE_OTHER): Payer: Self-pay | Admitting: Vascular Surgery

## 2021-10-17 DIAGNOSIS — I739 Peripheral vascular disease, unspecified: Secondary | ICD-10-CM

## 2021-10-19 ENCOUNTER — Ambulatory Visit (INDEPENDENT_AMBULATORY_CARE_PROVIDER_SITE_OTHER): Payer: Medicare Other | Admitting: Vascular Surgery

## 2021-10-19 ENCOUNTER — Encounter (INDEPENDENT_AMBULATORY_CARE_PROVIDER_SITE_OTHER): Payer: Medicare Other

## 2021-10-31 ENCOUNTER — Ambulatory Visit (INDEPENDENT_AMBULATORY_CARE_PROVIDER_SITE_OTHER): Payer: Medicare Other | Admitting: Podiatrist

## 2021-10-31 DIAGNOSIS — B351 Tinea unguium: Secondary | ICD-10-CM

## 2021-10-31 DIAGNOSIS — M79674 Pain in right toe(s): Secondary | ICD-10-CM

## 2021-10-31 DIAGNOSIS — M79675 Pain in left toe(s): Secondary | ICD-10-CM

## 2021-10-31 DIAGNOSIS — I70213 Atherosclerosis of native arteries of extremities with intermittent claudication, bilateral legs: Secondary | ICD-10-CM | POA: Diagnosis not present

## 2021-11-01 ENCOUNTER — Telehealth: Payer: Self-pay

## 2021-11-01 NOTE — Telephone Encounter (Signed)
Encounter created in error

## 2021-11-02 ENCOUNTER — Encounter: Payer: Self-pay | Admitting: Podiatrist

## 2021-11-02 NOTE — Progress Notes (Signed)
Subjective: Bobby Lane is a 81 y.o. male patient who presents to office today with concern of long,mildly painful nails  while ambulating in shoes; unable to trim. Bilateral hallux nails are painful on the corners as they have a pincer deformity present.  He has PVD with claudication noted.   Patient denies any new cramping, numbness, burning or tingling in the legs or feet.  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 difficile diarrhea 12/05/2018   Paroxysmal atrial fibrillation ([place]) 12/05/2018   Pressure injury of skin 11/20/2018   Sepsis ([place]) 11/19/2018   Pain due to onychomycosis of toenails of both feet 08/18/2018   Atypical chest pain 04/12/2016   Heme positive stool 04/12/2016   Indigestion 04/12/2016   Other dysphagia 04/12/2016   Fatigue 06/05/2013   Bradycardia 06/05/2013   Hypertensive renal disease    Gout    Thyroid disease    Coronary artery disease    Renal disorder    Thoracic and lumbosacral neuritis 01/13/2013   Low back pain 07/21/2012   Actinic keratosis 03/13/2012   Disease of sebaceous glands 03/13/2012   Seborrheic dermatitis of scalp 03/13/2012   Enlarged prostate with lower urinary tract symptoms (LUTS) 07/12/2011   Current Outpatient Medications on File Prior to Visit  Medication Sig Dispense Refill   acetaminophen (TYLENOL) 650 MG CR tablet Take 1,300 mg by mouth every 8  (eight) hours as needed for pain.     allopurinol (ZYLOPRIM) 300 MG tablet Take by mouth.     Cholecalciferol (VITAMIN D3) 5000 UNITS TABS Take 5,000 Units by mouth daily.     clonazePAM (KLONOPIN) 0.5 MG tablet Take 0.5 mg by mouth at bedtime as needed.     diclofenac Sodium (VOLTAREN) 1 % GEL      DULoxetine (CYMBALTA) 30 MG capsule Take 30 mg by mouth 2 (two) times daily.     finasteride (PROSCAR) 5 MG tablet Take 1 tablet (5 mg total) by mouth daily. 30 tablet 5   levothyroxine (SYNTHROID) 100 MCG tablet Take 100 mcg by mouth daily before breakfast.     losartan (COZAAR) 100 MG tablet Take 1 tablet (100 mg total) by mouth daily. 90 tablet 0   REPATHA SURECLICK 144 MG/ML SOAJ Inject 1 mL into the skin every 14 (fourteen) days. 2 mL 11   spironolactone (ALDACTONE) 25 MG tablet Take 12.5 mg by mouth every other day.     tamsulosin (FLOMAX) 0.4 MG CAPS capsule Take 1 capsule (0.4 mg total) by mouth daily. 30 capsule 5   TOPROL XL 100 MG 24 hr tablet Take 1 tablet (100 mg total) by mouth daily. Take with or immediately following a meal. 90 tablet 0   No current facility-administered medications on file prior to visit.   Allergies  Allergen Reactions   Codeine Nausea And Vomiting   Morphine And Related Nausea And Vomiting   Novocain [Procaine] Hives    NO TROUBLE IN OR   Other Nausea And Vomiting  Percocet [Oxycodone-Acetaminophen] Nausea And Vomiting   Sulfa Antibiotics Hives   Acetaminophen    Atorvastatin    Rosuvastatin    Tramadol Nausea And Vomiting    Note: upset stomach Note: upset stomach      Objective: General: Patient is awake, alert, and oriented x 3 and in no acute distress.  Integument: Skin is warm, dry and supple bilateral. Nails are tender, long, thickened and  dystrophic with subungual debris, consistent with onychomycosis, 1-5 bilateral. No signs of infection. Pincer nails bilateral first noted.  No open lesions or preulcerative lesions present bilateral.  Remaining integument unremarkable.  Vasculature:  Dorsalis Pedis pulse unable to palpate bilateral. Posterior Tibial pulse  0/4 bilateral.  Temperature gradient within normal limits.   Neurology: The patient has intact sensation measured with a 5.07/10g Semmes Weinstein Monofilament at all pedal sites bilateral . Vibratory sensation diminished bilateral with tuning fork. No Babinski sign present bilateral.   Musculoskeletal: No symptomatic pedal deformities noted bilateral. Muscular strength 5/5 in all lower extremity muscular groups bilateral without pain on range of motion . No tenderness with calf compression bilateral.  Assessment and Plan:   ICD-10-CM   1. Pain due to onychomycosis of toenails of both feet  B35.1    M79.675    M79.674     2. Atherosclerosis of native artery of both lower extremities with intermittent claudication ([place])  I29.798        -Examined patient. -Mechanically debrided all nails 1-5 bilateral using sterile nail nipper and filed with sterile dremel without incident  -Answered all patient questions -Patient to return  in 3 months for continued foot care.  -Patient advised to call the office if any problems or questions arise in the meantime.  Bronson Ing, DPM

## 2021-11-09 NOTE — Progress Notes (Deleted)
Cardiology Office Note:    Date:  11/09/2021   ID:  Bobby Lane, DOB [birth year], MRN [id number]  PCP:  Jon Billings, NP  [facility] Cardiologist:  Freada Bergeron, MD  [facility] Electrophysiologist:  None   Referring MD: Jon Billings, NP     History of Present Illness:    Bobby Lane is a 81 y.o. male with a hx of COPD, CAD, gout, HTN, and GERD who was referred by Bobby Holms, NP for further evaluation of palpitations.   Patient saw PCP recently for the sensation that his heart was "jumping in his chest." Had associated lightheadedness, but no chest pain, Sob or syncope. Had been off his metoprolol as he did not fill his prescription. Given symptoms, he was referred to Cardiology for further management.  Today, the patient states that he had bad palpitations back at the end of January. Felt like a pounding in his chest. Had some lightheadedness with this. Tried to rest and the symptoms eventually improved. He went to [facility] a couple days later where medical students where checking people's heart rates and he was told his ? rate was 140s and his blood pressure was elevated. They wanted to call EMS but he declined. He denies any current palpitations but states he sometimes feels like his heart skips a beat. No chest pain, nausea, vomiting, orthopnea or PND. Has reported history of CAD but no caths in our system. Said in his problem list that he had paroxysmal Afib, but patient is unaware of ever having an arrhythmia. He is not on [facility].   Past Medical History:  Diagnosis Date   Arthritis    Cancer ([place])    SKIN   COPD (chronic obstructive pulmonary disease) (HCC)    Coronary artery disease    Depression    Dyspnea    DOE   GERD (gastroesophageal reflux disease)    Gout    Heart murmur    History of hiatal hernia    History of kidney stones    History of wheezing    HOH (hard of hearing)    Hypertension    Hypothyroidism    Oxygen deficit    PRN USE; used  ~ 2014 for less than a year (no longer has as of 07/15/19)   Pneumonia    Renal disorder    Thyroid disease     Past Surgical History:  Procedure Laterality Date   ANTERIOR CERVICAL DECOMP/DISCECTOMY FUSION N/A 07/16/2019   Procedure: Cervical three-four Cervical five-six Cervical six-seven Anterior cervical decompression/discectomy/fusion;  Surgeon: Erline Levine, MD;  Location: [place];  Service: Neurosurgery;  Laterality: N/A;  3C   CARDIAC CATHETERIZATION     CARPAL TUNNEL RELEASE Left 07/16/2019   Procedure: LEFT CARPAL TUNNEL RELEASE;  Surgeon: Erline Levine, MD;  Location: [place];  Service: Neurosurgery;  Laterality: Left;   CATARACT EXTRACTION W/PHACO Right 08/21/2016   Procedure: CATARACT EXTRACTION PHACO AND INTRAOCULAR LENS PLACEMENT (IOC);  Surgeon: Birder Robson, MD;  Location: [facility];  Service: Ophthalmology;  Laterality: Right;  [place] 00:54.9 AP% 18.3 CDE 10.04 Fluid pack lot # [id number]   CATARACT EXTRACTION W/PHACO Left 09/18/2016   Procedure: CATARACT EXTRACTION PHACO AND INTRAOCULAR LENS PLACEMENT (IOC);  Surgeon: Birder Robson, MD;  Location: [facility];  Service: Ophthalmology;  Laterality: Left;  [place] 00:56 AP% 19.7 CDE 11.07 Fluid pack lot # [id number]   FRACTURE SURGERY     ANKLE   HERNIA REPAIR     UPPER GI ENDOSCOPY  Current Medications: No outpatient medications have been marked as taking for the 11/15/21 encounter (Appointment) with Freada Bergeron, MD.     Allergies:   Codeine, Morphine and related, Novocain [procaine], Other, Percocet [oxycodone-acetaminophen], Sulfa antibiotics, Acetaminophen, Atorvastatin, Rosuvastatin, and Tramadol   Social History   Socioeconomic History   Marital status: Single    Spouse name: Not on file   Number of children: Not on file   Years of education: Not on file   Highest education level: Not on file  Occupational History   Not on file  Tobacco Use   Smoking status: Never   Smokeless tobacco: Never  Vaping Use    Vaping Use: Never used  Substance and Sexual Activity   Alcohol use: No   Drug use: No   Sexual activity: Not on file  Other Topics Concern   Not on file  Social History Narrative   Not on file   Social Determinants of Health   Financial Resource Strain: Not on file  Food Insecurity: Not on file  Transportation Needs: Not on file  Physical Activity: Not on file  Stress: Not on file  Social Connections: Not on file     Family History: The patient's family history includes Brain cancer in his father. There is no history of Prostate cancer, Bladder Cancer, or Kidney cancer.  ROS:   Please see the history of present illness.    Review of Systems  Constitutional:  Negative for chills and fever.  HENT:  Negative for nosebleeds.   Eyes:  Negative for blurred vision and redness.  Respiratory:  Negative for shortness of breath.   Cardiovascular:  Positive for palpitations. Negative for chest pain, orthopnea, claudication, leg swelling and PND.  Gastrointestinal:  Negative for nausea and vomiting.  Genitourinary:  Negative for dysuria and hematuria.  Musculoskeletal:  Positive for joint pain.  Neurological:  Positive for dizziness. Negative for speech change and loss of consciousness.  Endo/Heme/Allergies:  Negative for polydipsia.    EKGs/Labs/Other Studies Reviewed:    The following studies were reviewed today: TTE 2013/06/29: ------------------------------------------------------------  Left ventricle:  The cavity size was normal. Wall thickness  was increased in a pattern of mild LVH. There was focal  basal hypertrophy. Systolic function was normal. The  estimated ejection fraction was in the range of 60% to 65%.  Wall motion was normal; there were no regional wall motion  abnormalities. Early diastolic septal annular tissue Doppler  velocities Ea were abnormal.   ------------------------------------------------------------  Aortic valve:   Mildly to moderately  calcified annulus.  Doppler:   There was no stenosis.    No regurgitation.   ------------------------------------------------------------  Aorta:  Aortic root: The aortic root was normal in size.  Ascending aorta: The ascending aorta was normal in size.   ------------------------------------------------------------  Mitral valve:   Structurally normal valve.   Leaflet  separation was normal.  Doppler:  Transvalvular velocity was  within the normal range. There was no evidence for stenosis.   No regurgitation.    Peak gradient: 85m Hg (D).   ------------------------------------------------------------  Left atrium:  The atrium was normal in size.   ------------------------------------------------------------  Right ventricle:  The cavity size was normal. Systolic  function was normal.   ------------------------------------------------------------  Pulmonic valve:    Structurally normal valve.   Cusp  separation was normal.  Doppler:  Transvalvular velocity was  within the normal range.  No regurgitation.   ------------------------------------------------------------  Tricuspid valve:   Structurally normal valve.  Doppler:  Mild regurgitation.   ------------------------------------------------------------  Right atrium:  The atrium was normal in size.   ------------------------------------------------------------  Pericardium:  There was no pericardial effusion.   ------------------------------------------------------------  Systemic veins:  Inferior vena cava: The vessel was normal in size; the  respirophasic diameter changes were in the normal range (=  50%); findings are consistent with normal central venous  pressure.    Coronary CTA: FINDINGS: Cardiovascular: Limited study primarily due to bolus dispersion and intermittent respiratory motion. No evidence of central or lobar pulmonary embolism. No acute aortic finding. There is multifocal atherosclerotic  calcification of the aorta and coronaries. Normal heart size. No pericardial effusion   Mediastinum/Nodes: Negative for adenopathy or mass   Lungs/Pleura: There is no edema, consolidation, effusion, or pneumothorax. Mild dependent atelectasis.   Upper Abdomen: Negative   Musculoskeletal: Spondylosis and generalized degenerative disease. 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 Osteopenia   Review of the MIP images confirms the above findings.   IMPRESSION: 1. Very limited pulmonary artery evaluation. No evidence of main or lobar pulmonary embolism. 2. Aortic and coronary atherosclerosis.  EKG:  EKG is  ordered today.  The ekg ordered today demonstrates NSR with PVCs HR 69  Recent Labs: No results found for requested labs within last 365 days.  Recent Lipid Panel    Component Value Date/Time   CHOL 82 (L) 10/24/2020 1425   TRIG 153 (H) 10/24/2020 1425   HDL 24 (L) 10/24/2020 1425   CHOLHDL 3.4 10/24/2020 1425   LDLCALC 32 10/24/2020 1425     Physical Exam:    VS:  There were no vitals taken for this visit.    Wt Readings from Last 3 Encounters:  11/01/20 208 lb (94.3 kg)  07/04/20 211 lb (95.7 kg)  03/25/20 202 lb (91.6 kg)     GEN:  Comfortable elderly male, sitting in a wheelchair, NAd HEENT: Normal NECK: No JVD; No carotid bruits CARDIAC: Irregular, no murmurs, rubs, gallops RESPIRATORY:  Clear to auscultation without rales, wheezing or rhonchi  ABDOMEN: Soft, non-tender, non-distended MUSCULOSKELETAL:  No edema; No deformity  SKIN: Warm and dry NEUROLOGIC:  Alert and oriented x 3 PSYCHIATRIC:  Normal affect   ASSESSMENT:    No diagnosis found.  PLAN:    In order of problems listed above:  #Palpitations: Patient with episode of heart pounding/jumping out of his chest in late Jan that lasted for about 1 day. Had some associated lightheadedness. Went to [employer] a couple days later and was told his HR was elevated as well as his BP. They wanted  to call EMS but he declined. Followed up with his PCP who referred him here. Currently, no having any palpitations. Has occasional sensation of "skipped beats." No chest pain, SOB, current dizziness or syncope. Patient does not think he has a history of arrhythmias.  -Check 14 day zio monitor -Check TTE -Continue metoprolol  #CAD with coronary calcification on CT chest: No current anginal symptoms. Denies history of cath or stent placement.  -Continue atorvastatin '20mg'$  daily -Continue home metoprolol and losartant -TTE as above  #HTN: Well controlled today. -Continue metoprolol and losartan as above  #HLD: -Continue lipitor '20mg'$  daily  #COPD: -Management per PCP   Medication Adjustments/Labs and Tests Ordered: Current medicines are reviewed at length with the patient today.  Concerns regarding medicines are outlined above.  No orders of the defined types were placed in this encounter.  No orders of the defined types were placed in this encounter.   There  are no Patient Instructions on file for this visit.    Signed, Freada Bergeron, MD  11/09/2021 1:56 PM    [place]

## 2021-11-13 NOTE — Progress Notes (Deleted)
Cardiology Office Note:    Date:  11/13/2021   ID:  Bobby Lane, DOB Feb 11, 1941, MRN [id number]  PCP:  Jon Billings, NP  [facility] Cardiologist:  Freada Bergeron, MD  [facility] Electrophysiologist:  None   Referring MD: Jon Billings, NP     History of Present Illness:    Bobby Lane is a 80 y.o. male with a hx of COPD, CAD, gout, HTN, and GERD who presents to clinic for follow-up of palpitations.  Patient initially saw me on 03/25/20 for palpitations that he developed at the end of Jan 2022. Specifically, he felt a pounding in his chest. Had some lightheadedness with this. Tried to rest and the symptoms eventually improved. He went to [facility] a couple days later where medical students were checking people's heart rates and he was told his heart rate was 140s and his blood pressure was elevated. They wanted to call EMS but he declined. He presented to clinic on 03/25/20 where cardiac monitor was performed on 04/21/20 which showed 55 episodes of SVT with longest lasting 13 seconds, no Afib or sustained arrhythmias. TTE 05/19/20 hyperdynamic LV systolic function with intracavitary gradient, G1DD, no significant valve disease.  Was last seen in 06/2021 where he was struggling with neuropathy and arthritis. Was otherwise doing well from CV standpoint.  Today, ***   Past Medical History:  Diagnosis Date   Arthritis    Cancer ([place])    SKIN   COPD (chronic obstructive pulmonary disease) (HCC)    Coronary artery disease    Depression    Dyspnea    DOE   GERD (gastroesophageal reflux disease)    Gout    Heart murmur    History of hiatal hernia    History of kidney stones    History of wheezing    HOH (hard of hearing)    Hypertension    Hypothyroidism    Oxygen deficit    PRN USE; used ~ 2014 for less than a year (no longer has as of 07/15/19)   Pneumonia    Renal disorder    Thyroid disease     Past Surgical History:  Procedure Laterality Date    ANTERIOR CERVICAL DECOMP/DISCECTOMY FUSION N/A 07/16/2019   Procedure: Cervical three-four Cervical five-six Cervical six-seven Anterior cervical decompression/discectomy/fusion;  Surgeon: Erline Levine, MD;  Location: [place];  Service: Neurosurgery;  Laterality: N/A;  3C   CARDIAC CATHETERIZATION     CARPAL TUNNEL RELEASE Left 07/16/2019   Procedure: LEFT CARPAL TUNNEL RELEASE;  Surgeon: Erline Levine, MD;  Location: [place];  Service: Neurosurgery;  Laterality: Left;   CATARACT EXTRACTION W/PHACO Right 08/21/2016   Procedure: CATARACT EXTRACTION PHACO AND INTRAOCULAR LENS PLACEMENT (IOC);  Surgeon: Birder Robson, MD;  Location: [facility];  Service: Ophthalmology;  Laterality: Right;  [place] 00:54.9 AP% 18.3 CDE 10.04 Fluid pack lot # [id number]   CATARACT EXTRACTION W/PHACO Left 09/18/2016   Procedure: CATARACT EXTRACTION PHACO AND INTRAOCULAR LENS PLACEMENT (IOC);  Surgeon: Birder Robson, MD;  Location: [facility];  Service: Ophthalmology;  Laterality: Left;  [place] 00:56 AP% 19.7 CDE 11.07 Fluid pack lot # [id number]   FRACTURE SURGERY     ANKLE   HERNIA REPAIR     UPPER GI ENDOSCOPY      Current Medications: No outpatient medications have been marked as taking for the 11/15/21 encounter (Appointment) with Freada Bergeron, MD.     Allergies:   Codeine, Morphine and related, Novocain [procaine], Other, Percocet [oxycodone-acetaminophen], Sulfa antibiotics,  Acetaminophen, Atorvastatin, Rosuvastatin, and Tramadol   Social History   Socioeconomic History   Marital status: Single    Spouse name: Not on file   Number of children: Not on file   Years of education: Not on file   Highest education level: Not on file  Occupational History   Not on file  Tobacco Use   Smoking status: Never   Smokeless tobacco: Never  Vaping Use   Vaping Use: Never used  Substance and Sexual Activity   Alcohol use: No   Drug use: No   Sexual activity: Not on file  Other Topics Concern   Not on file  Social  History Narrative   Not on file   Social Determinants of Health   Financial Resource Strain: Not on file  Food Insecurity: Not on file  Transportation Needs: Not on file  Physical Activity: Not on file  Stress: Not on file  Social Connections: Not on file     Family History: The patient's family history includes Brain cancer in his father. There is no history of Prostate cancer, Bladder Cancer, or Kidney cancer.  ROS:   Please see the history of present illness.    Review of Systems  Constitutional:  Positive for malaise/fatigue. Negative for chills and fever.  HENT:  Negative for nosebleeds.   Eyes:  Negative for blurred vision and redness.  Respiratory:  Negative for shortness of breath.   Cardiovascular:  Positive for palpitations. Negative for chest pain, orthopnea, claudication, leg swelling and PND.  Gastrointestinal:  Negative for nausea and vomiting.  Genitourinary:  Negative for dysuria and hematuria.  Musculoskeletal:  Negative for joint pain.  Neurological:  Positive for headaches. Negative for dizziness, speech change and loss of consciousness.  Endo/Heme/Allergies:  Negative for polydipsia.    EKGs/Labs/Other Studies Reviewed:    The following studies were reviewed today: TTE 05-23-2020: IMPRESSIONS     1. Intracavitary gradient at rest. Peak velocity 1.87 m/s. Peak gradient  14 mmHg. Peak gradient increases to 36 mmHg and peak velocity increases to  3 m/s with Valsalva. Left ventricular ejection fraction, by estimation, is  70 to 75%. The left ventricle  has hyperdynamic function. The left ventricle has no regional wall motion  abnormalities. Left ventricular diastolic parameters are consistent with  Grade I diastolic dysfunction (impaired relaxation).   2. Right ventricular systolic function is normal. The right ventricular  size is normal.   3. The mitral valve is normal in structure. No evidence of mitral valve  regurgitation. No evidence of mitral  stenosis.   4. The aortic valve is normal in structure. Aortic valve regurgitation is  not visualized. No aortic stenosis is present.   5. The inferior vena cava is normal in size with greater than 50%  respiratory variability, suggesting right atrial pressure of 3 mmHg.   Comparison(s): 06/23/13 EF 60-65%.   Cardiac Monitor 04/21/20: Patch wear time 13 days and 17 hours Predominant rhythm is NSR with average HR 70; ranging from 50-187bpm There were 55 episodes of SVT with longest lasting 13.2 seconds at rate 187bpm (also the fastest episode); did not correlate with patient triggered event. Isolated SVEs and PVCs were rate Ventricular bigeminy and trigeminy were present No Afib, significant pauses or VT     Patch Wear Time:  13 days and 17 hours (2022-02-17T15:00:04-498 to 2022-03-03T08:36:54-0500)   Patient had a min HR of 50 bpm, max HR of 187 bpm, and avg HR of 70 bpm. Predominant underlying rhythm was Sinus  Rhythm. 55 Supraventricular Tachycardia runs occurred, the run with the fastest interval lasting 13.2 secs with a max rate of 187 bpm (avg  149 bpm); the run with the fastest interval was also the longest. Idioventricular Rhythm was present. Ectopic Atrial Rhythm was present. Isolated SVEs were rare (<1.0%), SVE Couplets were rare (<1.0%), and SVE Triplets were rare (<1.0%). Isolated VEs  were rare (<1.0%, 5778), VE Couplets were rare (<1.0%, 36), and VE Triplets were rare (<1.0%, 1). Ventricular Bigeminy and Trigeminy were present.    TTE 06/23/13: ------------------------------------------------------------  Left ventricle:  The cavity size was normal. Wall thickness  was increased in a pattern of mild LVH. There was focal  basal hypertrophy. Systolic function was normal. The  estimated ejection fraction was in the range of 60% to 65%.  Wall motion was normal; there were no regional wall motion  abnormalities. Early diastolic septal annular tissue Doppler  velocities Ea were  abnormal.   ------------------------------------------------------------  Aortic valve:   Mildly to moderately calcified annulus.  Doppler:   There was no stenosis.    No regurgitation.   ------------------------------------------------------------  Aorta:  Aortic root: The aortic root was normal in size.  Ascending aorta: The ascending aorta was normal in size.   ------------------------------------------------------------  Mitral valve:   Structurally normal valve.   Leaflet  separation was normal.  Doppler:  Transvalvular velocity was  within the normal range. There was no evidence for stenosis.   No regurgitation.    Peak gradient: 47m Hg (D).   ------------------------------------------------------------  Left atrium:  The atrium was normal in size.   ------------------------------------------------------------  Right ventricle:  The cavity size was normal. Systolic  function was normal.   ------------------------------------------------------------  Pulmonic valve:    Structurally normal valve.   Cusp  separation was normal.  Doppler:  Transvalvular velocity was  within the normal range.  No regurgitation.   ------------------------------------------------------------  Tricuspid valve:   Structurally normal valve.    Doppler:  Mild regurgitation.   ------------------------------------------------------------  Right atrium:  The atrium was normal in size.   ------------------------------------------------------------  Pericardium:  There was no pericardial effusion.   ------------------------------------------------------------  Systemic veins:  Inferior vena cava: The vessel was normal in size; the  respirophasic diameter changes were in the normal range (=  50%); findings are consistent with normal central venous  pressure.    Coronary CTA: FINDINGS: Cardiovascular: Limited study primarily due to bolus dispersion and intermittent respiratory motion. No evidence  of central or lobar pulmonary embolism. No acute aortic finding. There is multifocal atherosclerotic calcification of the aorta and coronaries. Normal heart size. No pericardial effusion   Mediastinum/Nodes: Negative for adenopathy or mass   Lungs/Pleura: There is no edema, consolidation, effusion, or pneumothorax. Mild dependent atelectasis.   Upper Abdomen: Negative   Musculoskeletal: Spondylosis and generalized degenerative disease. 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 Osteopenia   Review of the MIP images confirms the above findings.   IMPRESSION: 1. Very limited pulmonary artery evaluation. No evidence of main or lobar pulmonary embolism. 2. Aortic and coronary atherosclerosis.  EKG:    07/04/2020: EKG is not ordered today. 03/25/2020: NSR with PVCs HR 69  Recent Labs: No results found for requested labs within last 365 days.  Recent Lipid Panel    Component Value Date/Time   CHOL 82 (L) 10/24/2020 1425   TRIG 153 (H) 10/24/2020 1425   HDL 24 (L) 10/24/2020 1425   CHOLHDL 3.4 10/24/2020 1425   LDLCALC 32 10/24/2020 1425  Physical Exam:    VS:  There were no vitals taken for this visit.    Wt Readings from Last 3 Encounters:  11/01/20 208 lb (94.3 kg)  07/04/20 211 lb (95.7 kg)  03/25/20 202 lb (91.6 kg)     GEN:  Comfortable elderly male, sitting in a wheelchair, NAd HEENT: Normal NECK: No JVD; No carotid bruits CARDIAC: Irregular, no murmurs, rubs, gallops RESPIRATORY:  Clear to auscultation without rales, wheezing or rhonchi  ABDOMEN: Soft, non-tender, non-distended MUSCULOSKELETAL:  No edema; No deformity  SKIN: Warm and dry NEUROLOGIC:  Alert and oriented x 3 PSYCHIATRIC:  Normal affect   ASSESSMENT:    No diagnosis found.  PLAN:    In order of problems listed above:  #Palpitations: #SVT: Patient with episode of heart pounding/jumping out of his chest in late Jan that lasted for about 1 day. Had some associated  lightheadedness. Went to [employer] a couple days later and was told his HR was elevated as well as his BP. Obatined cardiac monitor that showed 55 runs of nonsustained SVT but no afib or sustained arrhythmias. TTE with hyperdynamic function and G1DD, no valve disease. Palpitations significantly improved on metoprolol. -Continue metoprolol '100mg'$  XL daily  #CAD with coronary calcification on CT chest: No current anginal symptoms.  -Continue repatha '140mg'$  q14d -Continue home metoprolol '100mg'$  XL daily and losartan '100mg'$  daily  #HTN: Well controlled today. -Continue home metoprolol '100mg'$  XL daily and losartan '100mg'$  daily  #HLD: -Continue repatha '140mg'$  q14d  #COPD: -Management per PCP  #Peripheral Neuropathy: #Joint Pain: Followed by PCP and awaiting motorized scooter. -Continue management per PCP  Follow-up in 6-8 months.  Medication Adjustments/Labs and Tests Ordered: Current medicines are reviewed at length with the patient today.  Concerns regarding medicines are outlined above.  No orders of the defined types were placed in this encounter.  No orders of the defined types were placed in this encounter.   There are no Patient Instructions on file for this visit.    Signed, Freada Bergeron, MD  11/13/2021 7:42 PM    [place]

## 2021-11-15 ENCOUNTER — Ambulatory Visit: Payer: Medicare Other | Admitting: Cardiology

## 2021-12-10 NOTE — Progress Notes (Deleted)
Cardiology Office Note:    Date:  12/10/2021   ID:  Bobby Lane, DOB 1940-08-08, MRN [id number]  PCP:  Jon Billings, NP  [facility] Cardiologist:  Freada Bergeron, MD  [facility] Electrophysiologist:  None   Referring MD: Jon Billings, NP     History of Present Illness:    Bobby Lane is a 81 y.o. male with a hx of COPD, CAD, gout, HTN, and GERD who presents to clinic for follow-up of palpitations.  Patient initially saw me on 03/25/20 for palpitations that he developed at the end of Jan 2022. Specifically, he felt a pounding in his chest. Had some lightheadedness with this. Tried to rest and the symptoms eventually improved. He went to [facility] a couple days later where medical students were checking people's heart rates and he was told his heart rate was 140s and his blood pressure was elevated. They wanted to call EMS but he declined. He presented to clinic on 03/25/20 where cardiac monitor was performed on 04/21/20 which showed 55 episodes of SVT with longest lasting 13 seconds, no Afib or sustained arrhythmias. TTE 05/19/20 hyperdynamic LV systolic function with intracavitary gradient, G1DD, no significant valve disease.  Was last seen in 06/2021 where he was struggling with neuropathy and arthritis. Was otherwise doing well from CV standpoint.  Today, ***   Past Medical History:  Diagnosis Date   Arthritis    Cancer ([place])    SKIN   COPD (chronic obstructive pulmonary disease) (HCC)    Coronary artery disease    Depression    Dyspnea    DOE   GERD (gastroesophageal reflux disease)    Gout    Heart murmur    History of hiatal hernia    History of kidney stones    History of wheezing    HOH (hard of hearing)    Hypertension    Hypothyroidism    Oxygen deficit    PRN USE; used ~ 2014 for less than a year (no longer has as of 07/15/19)   Pneumonia    Renal disorder    Thyroid disease     Past Surgical History:  Procedure Laterality Date    ANTERIOR CERVICAL DECOMP/DISCECTOMY FUSION N/A 07/16/2019   Procedure: Cervical three-four Cervical five-six Cervical six-seven Anterior cervical decompression/discectomy/fusion;  Surgeon: Erline Levine, MD;  Location: [place];  Service: Neurosurgery;  Laterality: N/A;  3C   CARDIAC CATHETERIZATION     CARPAL TUNNEL RELEASE Left 07/16/2019   Procedure: LEFT CARPAL TUNNEL RELEASE;  Surgeon: Erline Levine, MD;  Location: [place];  Service: Neurosurgery;  Laterality: Left;   CATARACT EXTRACTION W/PHACO Right 08/21/2016   Procedure: CATARACT EXTRACTION PHACO AND INTRAOCULAR LENS PLACEMENT (IOC);  Surgeon: Birder Robson, MD;  Location: [facility];  Service: Ophthalmology;  Laterality: Right;  [place] 00:54.9 AP% 18.3 CDE 10.04 Fluid pack lot # [id number]   CATARACT EXTRACTION W/PHACO Left 09/18/2016   Procedure: CATARACT EXTRACTION PHACO AND INTRAOCULAR LENS PLACEMENT (IOC);  Surgeon: Birder Robson, MD;  Location: [facility];  Service: Ophthalmology;  Laterality: Left;  [place] 00:56 AP% 19.7 CDE 11.07 Fluid pack lot # [id number]   FRACTURE SURGERY     ANKLE   HERNIA REPAIR     UPPER GI ENDOSCOPY      Current Medications: No outpatient medications have been marked as taking for the 12/13/21 encounter (Appointment) with Freada Bergeron, MD.     Allergies:   Codeine, Morphine and related, Novocain [procaine], Other, Percocet [oxycodone-acetaminophen], Sulfa antibiotics,  Acetaminophen, Atorvastatin, Rosuvastatin, and Tramadol   Social History   Socioeconomic History   Marital status: Single    Spouse name: Not on file   Number of children: Not on file   Years of education: Not on file   Highest education level: Not on file  Occupational History   Not on file  Tobacco Use   Smoking status: Never   Smokeless tobacco: Never  Vaping Use   Vaping Use: Never used  Substance and Sexual Activity   Alcohol use: No   Drug use: No   Sexual activity: Not on file  Other Topics Concern   Not on file  Social  History Narrative   Not on file   Social Determinants of Health   Financial Resource Strain: Not on file  Food Insecurity: Not on file  Transportation Needs: Not on file  Physical Activity: Not on file  Stress: Not on file  Social Connections: Not on file     Family History: The patient's family history includes Brain cancer in his father. There is no history of Prostate cancer, Bladder Cancer, or Kidney cancer.  ROS:   Please see the history of present illness.    Review of Systems  Constitutional:  Positive for malaise/fatigue. Negative for chills and fever.  HENT:  Negative for nosebleeds.   Eyes:  Negative for blurred vision and redness.  Respiratory:  Negative for shortness of breath.   Cardiovascular:  Positive for palpitations. Negative for chest pain, orthopnea, claudication, leg swelling and PND.  Gastrointestinal:  Negative for nausea and vomiting.  Genitourinary:  Negative for dysuria and hematuria.  Musculoskeletal:  Negative for joint pain.  Neurological:  Positive for headaches. Negative for dizziness, speech change and loss of consciousness.  Endo/Heme/Allergies:  Negative for polydipsia.    EKGs/Labs/Other Studies Reviewed:    The following studies were reviewed today: TTE 05-23-2020: IMPRESSIONS     1. Intracavitary gradient at rest. Peak velocity 1.87 m/s. Peak gradient  14 mmHg. Peak gradient increases to 36 mmHg and peak velocity increases to  3 m/s with Valsalva. Left ventricular ejection fraction, by estimation, is  70 to 75%. The left ventricle  has hyperdynamic function. The left ventricle has no regional wall motion  abnormalities. Left ventricular diastolic parameters are consistent with  Grade I diastolic dysfunction (impaired relaxation).   2. Right ventricular systolic function is normal. The right ventricular  size is normal.   3. The mitral valve is normal in structure. No evidence of mitral valve  regurgitation. No evidence of mitral  stenosis.   4. The aortic valve is normal in structure. Aortic valve regurgitation is  not visualized. No aortic stenosis is present.   5. The inferior vena cava is normal in size with greater than 50%  respiratory variability, suggesting right atrial pressure of 3 mmHg.   Comparison(s): 06/23/13 EF 60-65%.   Cardiac Monitor 04/21/20: Patch wear time 13 days and 17 hours Predominant rhythm is NSR with average HR 70; ranging from 50-187bpm There were 55 episodes of SVT with longest lasting 13.2 seconds at rate 187bpm (also the fastest episode); did not correlate with patient triggered event. Isolated SVEs and PVCs were rate Ventricular bigeminy and trigeminy were present No Afib, significant pauses or VT     Patch Wear Time:  13 days and 17 hours (2022-02-17T15:00:04-498 to 2022-03-03T08:36:54-0500)   Patient had a min HR of 50 bpm, max HR of 187 bpm, and avg HR of 70 bpm. Predominant underlying rhythm was Sinus  Rhythm. 55 Supraventricular Tachycardia runs occurred, the run with the fastest interval lasting 13.2 secs with a max rate of 187 bpm (avg  149 bpm); the run with the fastest interval was also the longest. Idioventricular Rhythm was present. Ectopic Atrial Rhythm was present. Isolated SVEs were rare (<1.0%), SVE Couplets were rare (<1.0%), and SVE Triplets were rare (<1.0%). Isolated VEs  were rare (<1.0%, 5778), VE Couplets were rare (<1.0%, 36), and VE Triplets were rare (<1.0%, 1). Ventricular Bigeminy and Trigeminy were present.    TTE 06/23/13: ------------------------------------------------------------  Left ventricle:  The cavity size was normal. Wall thickness  was increased in a pattern of mild LVH. There was focal  basal hypertrophy. Systolic function was normal. The  estimated ejection fraction was in the range of 60% to 65%.  Wall motion was normal; there were no regional wall motion  abnormalities. Early diastolic septal annular tissue Doppler  velocities Ea were  abnormal.   ------------------------------------------------------------  Aortic valve:   Mildly to moderately calcified annulus.  Doppler:   There was no stenosis.    No regurgitation.   ------------------------------------------------------------  Aorta:  Aortic root: The aortic root was normal in size.  Ascending aorta: The ascending aorta was normal in size.   ------------------------------------------------------------  Mitral valve:   Structurally normal valve.   Leaflet  separation was normal.  Doppler:  Transvalvular velocity was  within the normal range. There was no evidence for stenosis.   No regurgitation.    Peak gradient: 47m Hg (D).   ------------------------------------------------------------  Left atrium:  The atrium was normal in size.   ------------------------------------------------------------  Right ventricle:  The cavity size was normal. Systolic  function was normal.   ------------------------------------------------------------  Pulmonic valve:    Structurally normal valve.   Cusp  separation was normal.  Doppler:  Transvalvular velocity was  within the normal range.  No regurgitation.   ------------------------------------------------------------  Tricuspid valve:   Structurally normal valve.    Doppler:  Mild regurgitation.   ------------------------------------------------------------  Right atrium:  The atrium was normal in size.   ------------------------------------------------------------  Pericardium:  There was no pericardial effusion.   ------------------------------------------------------------  Systemic veins:  Inferior vena cava: The vessel was normal in size; the  respirophasic diameter changes were in the normal range (=  50%); findings are consistent with normal central venous  pressure.    Coronary CTA: FINDINGS: Cardiovascular: Limited study primarily due to bolus dispersion and intermittent respiratory motion. No evidence  of central or lobar pulmonary embolism. No acute aortic finding. There is multifocal atherosclerotic calcification of the aorta and coronaries. Normal heart size. No pericardial effusion   Mediastinum/Nodes: Negative for adenopathy or mass   Lungs/Pleura: There is no edema, consolidation, effusion, or pneumothorax. Mild dependent atelectasis.   Upper Abdomen: Negative   Musculoskeletal: Spondylosis and generalized degenerative disease. 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 Osteopenia   Review of the MIP images confirms the above findings.   IMPRESSION: 1. Very limited pulmonary artery evaluation. No evidence of main or lobar pulmonary embolism. 2. Aortic and coronary atherosclerosis.  EKG:    07/04/2020: EKG is not ordered today. 03/25/2020: NSR with PVCs HR 69  Recent Labs: No results found for requested labs within last 365 days.  Recent Lipid Panel    Component Value Date/Time   CHOL 82 (L) 10/24/2020 1425   TRIG 153 (H) 10/24/2020 1425   HDL 24 (L) 10/24/2020 1425   CHOLHDL 3.4 10/24/2020 1425   LDLCALC 32 10/24/2020 1425  Physical Exam:    VS:  There were no vitals taken for this visit.    Wt Readings from Last 3 Encounters:  11/01/20 208 lb (94.3 kg)  07/04/20 211 lb (95.7 kg)  03/25/20 202 lb (91.6 kg)     GEN:  Comfortable elderly male, sitting in a wheelchair, NAd HEENT: Normal NECK: No JVD; No carotid bruits CARDIAC: Irregular, no murmurs, rubs, gallops RESPIRATORY:  Clear to auscultation without rales, wheezing or rhonchi  ABDOMEN: Soft, non-tender, non-distended MUSCULOSKELETAL:  No edema; No deformity  SKIN: Warm and dry NEUROLOGIC:  Alert and oriented x 3 PSYCHIATRIC:  Normal affect   ASSESSMENT:    No diagnosis found.  PLAN:    In order of problems listed above:  #Palpitations: #SVT: Patient with episode of heart pounding/jumping out of his chest in late Jan that lasted for about 1 day. Had some associated  lightheadedness. Went to [employer] a couple days later and was told his HR was elevated as well as his BP. Obatined cardiac monitor that showed 55 runs of nonsustained SVT but no afib or sustained arrhythmias. TTE with hyperdynamic function and G1DD, no valve disease. Palpitations significantly improved on metoprolol. -Continue metoprolol '100mg'$  XL daily  #CAD with coronary calcification on CT chest: No current anginal symptoms.  -Continue repatha '140mg'$  q14d -Continue home metoprolol '100mg'$  XL daily and losartan '100mg'$  daily  #HTN: Well controlled today. -Continue home metoprolol '100mg'$  XL daily and losartan '100mg'$  daily  #HLD: -Continue repatha '140mg'$  q14d  #COPD: -Management per PCP  #Peripheral Neuropathy: #Joint Pain: Followed by PCP and awaiting motorized scooter. -Continue management per PCP  Follow-up in 6-8 months.  Medication Adjustments/Labs and Tests Ordered: Current medicines are reviewed at length with the patient today.  Concerns regarding medicines are outlined above.  No orders of the defined types were placed in this encounter.  No orders of the defined types were placed in this encounter.   There are no Patient Instructions on file for this visit.    Signed, Freada Bergeron, MD  12/10/2021 8:45 PM    [place]

## 2021-12-13 ENCOUNTER — Ambulatory Visit: Payer: Medicare Other | Admitting: Cardiology

## 2021-12-25 NOTE — Progress Notes (Unsigned)
Cardiology Office Note:    Date:  12/25/2021   ID:  Bobby Lane, DOB [birth year], MRN [id number]  PCP:  Jon Billings, NP  [facility] Cardiologist:  Freada Bergeron, MD  [facility] Electrophysiologist:  None   Referring MD: Jon Billings, NP     History of Present Illness:    Bobby Lane is a 81 y.o. male with a hx of COPD, CAD, gout, HTN, and GERD who presents to clinic for follow-up of palpitations.  Patient initially saw me on 03/25/20 for palpitations that he developed at the end of Jan 2022. Specifically, he felt a pounding in his chest. Had some lightheadedness with this. Tried to rest and the symptoms eventually improved. He went to [facility] a couple days later where medical students were checking people's heart rates and he was told his heart rate was 140s and his blood pressure was elevated. They wanted to call EMS but he declined. He presented to clinic on 03/25/20 where cardiac monitor was performed on 04/21/20 which showed 55 episodes of SVT with longest lasting 13 seconds, no Afib or sustained arrhythmias. TTE 05/19/20 hyperdynamic LV systolic function with intracavitary gradient, G1DD, no significant valve disease.  Was last seen in 06/2021 where he was struggling with neuropathy and arthritis. Was otherwise doing well from CV standpoint.  Today, ***   Past Medical History:  Diagnosis Date   Arthritis    Cancer ([place])    SKIN   COPD (chronic obstructive pulmonary disease) (HCC)    Coronary artery disease    Depression    Dyspnea    DOE   GERD (gastroesophageal reflux disease)    Gout    Heart murmur    History of hiatal hernia    History of kidney stones    History of wheezing    HOH (hard of hearing)    Hypertension    Hypothyroidism    Oxygen deficit    PRN USE; used ~ 2014 for less than a year (no longer has as of 07/15/19)   Pneumonia    Renal disorder    Thyroid disease     Past Surgical History:  Procedure Laterality Date    ANTERIOR CERVICAL DECOMP/DISCECTOMY FUSION N/A 07/16/2019   Procedure: Cervical three-four Cervical five-six Cervical six-seven Anterior cervical decompression/discectomy/fusion;  Surgeon: Erline Levine, MD;  Location: [place];  Service: Neurosurgery;  Laterality: N/A;  3C   CARDIAC CATHETERIZATION     CARPAL TUNNEL RELEASE Left 07/16/2019   Procedure: LEFT CARPAL TUNNEL RELEASE;  Surgeon: Erline Levine, MD;  Location: [place];  Service: Neurosurgery;  Laterality: Left;   CATARACT EXTRACTION W/PHACO Right 08/21/2016   Procedure: CATARACT EXTRACTION PHACO AND INTRAOCULAR LENS PLACEMENT (IOC);  Surgeon: Birder Robson, MD;  Location: [facility];  Service: Ophthalmology;  Laterality: Right;  [place] 00:54.9 AP% 18.3 CDE 10.04 Fluid pack lot # [id number]   CATARACT EXTRACTION W/PHACO Left 09/18/2016   Procedure: CATARACT EXTRACTION PHACO AND INTRAOCULAR LENS PLACEMENT (IOC);  Surgeon: Birder Robson, MD;  Location: [facility];  Service: Ophthalmology;  Laterality: Left;  [place] 00:56 AP% 19.7 CDE 11.07 Fluid pack lot # [id number]   FRACTURE SURGERY     ANKLE   HERNIA REPAIR     UPPER GI ENDOSCOPY      Current Medications: No outpatient medications have been marked as taking for the 12/27/21 encounter (Appointment) with Freada Bergeron, MD.     Allergies:   Codeine, Morphine and related, Novocain [procaine], Other, Percocet [oxycodone-acetaminophen], Sulfa antibiotics,  Acetaminophen, Atorvastatin, Rosuvastatin, and Tramadol   Social History   Socioeconomic History   Marital status: Single    Spouse name: Not on file   Number of children: Not on file   Years of education: Not on file   Highest education level: Not on file  Occupational History   Not on file  Tobacco Use   Smoking status: Never   Smokeless tobacco: Never  Vaping Use   Vaping Use: Never used  Substance and Sexual Activity   Alcohol use: No   Drug use: No   Sexual activity: Not on file  Other Topics Concern   Not on file   Social History Narrative   Not on file   Social Determinants of Health   Financial Resource Strain: Not on file  Food Insecurity: Not on file  Transportation Needs: Not on file  Physical Activity: Not on file  Stress: Not on file  Social Connections: Not on file     Family History: The patient's family history includes Brain cancer in his father. There is no history of Prostate cancer, Bladder Cancer, or Kidney cancer.  ROS:   Please see the history of present illness.    Review of Systems  Constitutional:  Positive for malaise/fatigue. Negative for chills and fever.  HENT:  Negative for nosebleeds.   Eyes:  Negative for blurred vision and redness.  Respiratory:  Negative for shortness of breath.   Cardiovascular:  Positive for palpitations. Negative for chest pain, orthopnea, claudication, leg swelling and PND.  Gastrointestinal:  Negative for nausea and vomiting.  Genitourinary:  Negative for dysuria and hematuria.  Musculoskeletal:  Negative for joint pain.  Neurological:  Positive for headaches. Negative for dizziness, speech change and loss of consciousness.  Endo/Heme/Allergies:  Negative for polydipsia.    EKGs/Labs/Other Studies Reviewed:    The following studies were reviewed today: TTE 05-30-20: IMPRESSIONS     1. Intracavitary gradient at rest. Peak velocity 1.87 m/s. Peak gradient  14 mmHg. Peak gradient increases to 36 mmHg and peak velocity increases to  3 m/s with Valsalva. Left ventricular ejection fraction, by estimation, is  70 to 75%. The left ventricle  has hyperdynamic function. The left ventricle has no regional wall motion  abnormalities. Left ventricular diastolic parameters are consistent with  Grade I diastolic dysfunction (impaired relaxation).   2. Right ventricular systolic function is normal. The right ventricular  size is normal.   3. The mitral valve is normal in structure. No evidence of mitral valve  regurgitation. No evidence of  mitral stenosis.   4. The aortic valve is normal in structure. Aortic valve regurgitation is  not visualized. No aortic stenosis is present.   5. The inferior vena cava is normal in size with greater than 50%  respiratory variability, suggesting right atrial pressure of 3 mmHg.   Comparison(s): 06/23/13 EF 60-65%.   Cardiac Monitor 04/21/20: Patch wear time 13 days and 17 hours Predominant rhythm is NSR with average HR 70; ranging from 50-187bpm There were 55 episodes of SVT with longest lasting 13.2 seconds at rate 187bpm (also the fastest episode); did not correlate with patient triggered event. Isolated SVEs and PVCs were rate Ventricular bigeminy and trigeminy were present No Afib, significant pauses or VT     Patch Wear Time:  13 days and 17 hours (2022-02-17T15:00:04-498 to 2022-03-03T08:36:54-0500)   Patient had a min HR of 50 bpm, max HR of 187 bpm, and avg HR of 70 bpm. Predominant underlying rhythm was Sinus  Rhythm. 55 Supraventricular Tachycardia runs occurred, the run with the fastest interval lasting 13.2 secs with a max rate of 187 bpm (avg  149 bpm); the run with the fastest interval was also the longest. Idioventricular Rhythm was present. Ectopic Atrial Rhythm was present. Isolated SVEs were rare (<1.0%), SVE Couplets were rare (<1.0%), and SVE Triplets were rare (<1.0%). Isolated VEs  were rare (<1.0%, 5778), VE Couplets were rare (<1.0%, 36), and VE Triplets were rare (<1.0%, 1). Ventricular Bigeminy and Trigeminy were present.    TTE 06/23/13: ------------------------------------------------------------  Left ventricle:  The cavity size was normal. Wall thickness  was increased in a pattern of mild LVH. There was focal  basal hypertrophy. Systolic function was normal. The  estimated ejection fraction was in the range of 60% to 65%.  Wall motion was normal; there were no regional wall motion  abnormalities. Early diastolic septal annular tissue Doppler  velocities Ea  were abnormal.   ------------------------------------------------------------  Aortic valve:   Mildly to moderately calcified annulus.  Doppler:   There was no stenosis.    No regurgitation.   ------------------------------------------------------------  Aorta:  Aortic root: The aortic root was normal in size.  Ascending aorta: The ascending aorta was normal in size.   ------------------------------------------------------------  Mitral valve:   Structurally normal valve.   Leaflet  separation was normal.  Doppler:  Transvalvular velocity was  within the normal range. There was no evidence for stenosis.   No regurgitation.    Peak gradient: 34m Hg (D).   ------------------------------------------------------------  Left atrium:  The atrium was normal in size.   ------------------------------------------------------------  Right ventricle:  The cavity size was normal. Systolic  function was normal.   ------------------------------------------------------------  Pulmonic valve:    Structurally normal valve.   Cusp  separation was normal.  Doppler:  Transvalvular velocity was  within the normal range.  No regurgitation.   ------------------------------------------------------------  Tricuspid valve:   Structurally normal valve.    Doppler:  Mild regurgitation.   ------------------------------------------------------------  Right atrium:  The atrium was normal in size.   ------------------------------------------------------------  Pericardium:  There was no pericardial effusion.   ------------------------------------------------------------  Systemic veins:  Inferior vena cava: The vessel was normal in size; the  respirophasic diameter changes were in the normal range (=  50%); findings are consistent with normal central venous  pressure.    Coronary CTA: FINDINGS: Cardiovascular: Limited study primarily due to bolus dispersion and intermittent respiratory motion. No  evidence of central or lobar pulmonary embolism. No acute aortic finding. There is multifocal atherosclerotic calcification of the aorta and coronaries. Normal heart size. No pericardial effusion   Mediastinum/Nodes: Negative for adenopathy or mass   Lungs/Pleura: There is no edema, consolidation, effusion, or pneumothorax. Mild dependent atelectasis.   Upper Abdomen: Negative   Musculoskeletal: Spondylosis and generalized degenerative disease. 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 Osteopenia   Review of the MIP images confirms the above findings.   IMPRESSION: 1. Very limited pulmonary artery evaluation. No evidence of main or lobar pulmonary embolism. 2. Aortic and coronary atherosclerosis.  EKG:    07/04/2020: EKG is not ordered today. 03/25/2020: NSR with PVCs HR 69  Recent Labs: No results found for requested labs within last 365 days.  Recent Lipid Panel    Component Value Date/Time   CHOL 82 (L) 10/24/2020 1425   TRIG 153 (H) 10/24/2020 1425   HDL 24 (L) 10/24/2020 1425   CHOLHDL 3.4 10/24/2020 1425   LDLCALC 32 10/24/2020 1425  Physical Exam:    VS:  There were no vitals taken for this visit.    Wt Readings from Last 3 Encounters:  11/01/20 208 lb (94.3 kg)  07/04/20 211 lb (95.7 kg)  03/25/20 202 lb (91.6 kg)     GEN:  Comfortable elderly male, sitting in a wheelchair, NAd HEENT: Normal NECK: No JVD; No carotid bruits CARDIAC: Irregular, no murmurs, rubs, gallops RESPIRATORY:  Clear to auscultation without rales, wheezing or rhonchi  ABDOMEN: Soft, non-tender, non-distended MUSCULOSKELETAL:  No edema; No deformity  SKIN: Warm and dry NEUROLOGIC:  Alert and oriented x 3 PSYCHIATRIC:  Normal affect   ASSESSMENT:    No diagnosis found.  PLAN:    In order of problems listed above:  #Palpitations: #SVT: Patient with episode of heart pounding/jumping out of his chest in late Jan that lasted for about 1 day. Had some associated  lightheadedness. Went to [employer] a couple days later and was told his HR was elevated as well as his BP. Obatined cardiac monitor that showed 55 runs of nonsustained SVT but no afib or sustained arrhythmias. TTE with hyperdynamic function and G1DD, no valve disease. Palpitations significantly improved on metoprolol. -Continue metoprolol '100mg'$  XL daily  #CAD with coronary calcification on CT chest: No current anginal symptoms.  -Continue repatha '140mg'$  q14d -Continue home metoprolol '100mg'$  XL daily and losartan '100mg'$  daily  #HTN: Well controlled today. -Continue home metoprolol '100mg'$  XL daily and losartan '100mg'$  daily  #HLD: -Continue repatha '140mg'$  q14d  #COPD: -Management per PCP  #Peripheral Neuropathy: #Joint Pain: Followed by PCP and awaiting motorized scooter. -Continue management per PCP  Follow-up in 6-8 months.  Medication Adjustments/Labs and Tests Ordered: Current medicines are reviewed at length with the patient today.  Concerns regarding medicines are outlined above.  No orders of the defined types were placed in this encounter.  No orders of the defined types were placed in this encounter.   There are no Patient Instructions on file for this visit.    Signed, Freada Bergeron, MD  12/25/2021 2:01 PM    [facility]

## 2021-12-27 ENCOUNTER — Ambulatory Visit: Payer: Medicare Other | Attending: Cardiology | Admitting: Cardiology

## 2021-12-27 VITALS — BP 130/80 | HR 93 | Ht 65.0 in | Wt 210.3 lb

## 2021-12-27 DIAGNOSIS — I10 Essential (primary) hypertension: Secondary | ICD-10-CM

## 2021-12-27 DIAGNOSIS — R002 Palpitations: Secondary | ICD-10-CM

## 2021-12-27 DIAGNOSIS — I2510 Atherosclerotic heart disease of native coronary artery without angina pectoris: Secondary | ICD-10-CM

## 2021-12-27 DIAGNOSIS — E782 Mixed hyperlipidemia: Secondary | ICD-10-CM

## 2021-12-27 DIAGNOSIS — I471 Supraventricular tachycardia, unspecified: Secondary | ICD-10-CM

## 2021-12-27 DIAGNOSIS — J449 Chronic obstructive pulmonary disease, unspecified: Secondary | ICD-10-CM

## 2021-12-27 MED ORDER — TOPROL XL 100 MG PO TB24: 100.0000 mg | ORAL_TABLET | Freq: Every day | ORAL | 3 refills | Status: DC

## 2021-12-27 NOTE — Patient Instructions (Signed)
Medication Instructions:   Your physician recommends that you continue on your current medications as directed. Please refer to the Current Medication list given to you today.  *If you need a refill on your cardiac medications before your next appointment, please call your pharmacy*   Follow-Up: At [facility], you and your health needs are our priority.  As part of our continuing mission to provide you with exceptional heart care, we have created designated Provider Care Teams.  These Care Teams include your primary Cardiologist (physician) and Advanced Practice Providers (APPs -  Physician Assistants and Nurse Practitioners) who all work together to provide you with the care you need, when you need it.  We recommend signing up for the patient portal called "MyChart".  Sign up information is provided on this After Visit Summary.  MyChart is used to connect with patients for Virtual Visits (Telemedicine).  Patients are able to view lab/test results, encounter notes, upcoming appointments, etc.  Non-urgent messages can be sent to your provider as well.   To learn more about what you can do with MyChart, go to [email or web address].    Your next appointment:   6 month(s)  The format for your next appointment:   In Person  Provider:   Heather E Pemberton, MD     Important Information About Sugar       

## 2021-12-27 NOTE — Progress Notes (Signed)
Cardiology Office Note:    Date:  12/27/2021   ID:  Bobby Lane, DOB [birth year], MRN [id number]  PCP:  Jon Billings, NP  [facility] Cardiologist:  Freada Bergeron, MD  [facility] Electrophysiologist:  None   Referring MD: Jon Billings, NP     History of Present Illness:    Bobby Lane is a 81 y.o. male with a hx of COPD, CAD, gout, HTN, and GERD who presents to clinic for follow-up of palpitations.  Patient initially saw me on 03/25/20 for palpitations that he developed at the end of Jan 2022. Specifically, he felt a pounding in his chest. Had some lightheadedness with this. Tried to rest and the symptoms eventually improved. He went to [facility] a couple days later where medical students were checking people's heart rates and he was told his heart rate was 140s and his blood pressure was elevated. They wanted to call EMS but he declined. He presented to clinic on 03/25/20 where cardiac monitor was performed on 04/21/20 which showed 55 episodes of SVT with longest lasting 13 seconds, no Afib or sustained arrhythmias. TTE 05/19/20 hyperdynamic LV systolic function with intracavitary gradient, G1DD, no significant valve disease.  Was last seen in 06/2021 where he was struggling with neuropathy and arthritis. Was otherwise doing well from CV standpoint.  Today, patient states that he can't walk properly without help because of his arthritis and neuropathy. No chest pain or worsening in his baseline SOB.  He was told to put on compressions socks for his legs but he said that he would need to help to put them on. He went to the drug store to get one but he couldn't find one that would reach his knees. We helped him order some off [employer] today.   Otherwise, he is stable from a CV standpoint. He denies any palpitations, chest pain, shortness of breath, or peripheral edema. No lightheadedness, headaches, syncope, orthopnea, or PND.  BP has been elevated but accidentally  threw out his metop and has been off the medication.  Past Medical History:  Diagnosis Date   Arthritis    Cancer ([place])    SKIN   COPD (chronic obstructive pulmonary disease) (HCC)    Coronary artery disease    Depression    Dyspnea    DOE   GERD (gastroesophageal reflux disease)    Gout    Heart murmur    History of hiatal hernia    History of kidney stones    History of wheezing    HOH (hard of hearing)    Hypertension    Hypothyroidism    Oxygen deficit    PRN USE; used ~ 2014 for less than a year (no longer has as of 07/15/19)   Pneumonia    Renal disorder    Thyroid disease     Past Surgical History:  Procedure Laterality Date   ANTERIOR CERVICAL DECOMP/DISCECTOMY FUSION N/A 07/16/2019   Procedure: Cervical three-four Cervical five-six Cervical six-seven Anterior cervical decompression/discectomy/fusion;  Surgeon: Erline Levine, MD;  Location: [place];  Service: Neurosurgery;  Laterality: N/A;  3C   CARDIAC CATHETERIZATION     CARPAL TUNNEL RELEASE Left 07/16/2019   Procedure: LEFT CARPAL TUNNEL RELEASE;  Surgeon: Erline Levine, MD;  Location: [place];  Service: Neurosurgery;  Laterality: Left;   CATARACT EXTRACTION W/PHACO Right 08/21/2016   Procedure: CATARACT EXTRACTION PHACO AND INTRAOCULAR LENS PLACEMENT (IOC);  Surgeon: Birder Robson, MD;  Location: [facility];  Service: Ophthalmology;  Laterality: Right;  [place] 00:54.9 AP% 18.3 CDE 10.04 Fluid pack lot # [id number]   CATARACT EXTRACTION W/PHACO Left 09/18/2016   Procedure: CATARACT EXTRACTION PHACO AND INTRAOCULAR LENS PLACEMENT (IOC);  Surgeon: Birder Robson, MD;  Location: [facility];  Service: Ophthalmology;  Laterality: Left;  [place] 00:56 AP% 19.7 CDE 11.07 Fluid pack lot # [id number]   FRACTURE SURGERY     ANKLE   HERNIA REPAIR     UPPER GI ENDOSCOPY      Current Medications: Current Meds  Medication Sig   acetaminophen (TYLENOL) 650 MG CR tablet Take 1,300 mg by mouth every 8 (eight) hours as needed for pain.    allopurinol (ZYLOPRIM) 300 MG tablet Take by mouth.   Cholecalciferol (VITAMIN D3) 5000 UNITS TABS Take 5,000 Units by mouth daily.   DULoxetine (CYMBALTA) 30 MG capsule Take 30 mg by mouth 2 (two) times daily.   finasteride (PROSCAR) 5 MG tablet Take 1 tablet (5 mg total) by mouth daily.   levothyroxine (SYNTHROID) 100 MCG tablet Take 100 mcg by mouth daily before breakfast.   tamsulosin (FLOMAX) 0.4 MG CAPS capsule Take 1 capsule (0.4 mg total) by mouth daily.   [DISCONTINUED] TOPROL XL 100 MG 24 hr tablet Take 1 tablet (100 mg total) by mouth daily. Take with or immediately following a meal.     Allergies:   Codeine, Morphine and related, Novocain [procaine], Other, Percocet [oxycodone-acetaminophen], Sulfa antibiotics, Acetaminophen, Atorvastatin, Rosuvastatin, and Tramadol   Social History   Socioeconomic History   Marital status: Single    Spouse name: Not on file   Number of children: Not on file   Years of education: Not on file   Highest education level: Not on file  Occupational History   Not on file  Tobacco Use   Smoking status: Never   Smokeless tobacco: Never  Vaping Use   Vaping Use: Never used  Substance and Sexual Activity   Alcohol use: No   Drug use: No   Sexual activity: Not on file  Other Topics Concern   Not on file  Social History Narrative   Not on file   Social Determinants of Health   Financial Resource Strain: Not on file  Food Insecurity: Not on file  Transportation Needs: Not on file  Physical Activity: Not on file  Stress: Not on file  Social Connections: Not on file     Family History: The patient's family history includes Brain cancer in his father. There is no history of Prostate cancer, Bladder Cancer, or Kidney cancer.  ROS:   Please see the history of present illness.    Review of Systems  Constitutional:  Negative for chills and fever.  HENT:  Negative for nosebleeds and tinnitus.   Eyes:  Negative for blurred vision and pain.   Respiratory:  Negative for cough, hemoptysis, shortness of breath and stridor.   Cardiovascular:  Negative for chest pain, palpitations, orthopnea, claudication, leg swelling and PND.  Gastrointestinal:  Negative for blood in stool, diarrhea, nausea and vomiting.  Genitourinary:  Negative for dysuria and hematuria.  Musculoskeletal:  Negative for falls.  Neurological:  Negative for dizziness, loss of consciousness and headaches.  Psychiatric/Behavioral:  Negative for depression, hallucinations and substance abuse. The patient does not have insomnia.     EKGs/Labs/Other Studies Reviewed:    The following studies were reviewed today:  ABI Studies 04/17/2021: Summary: Right: Resting right ankle-brachial index is within normal range. No evidence of significant right lower extremity arterial disease. The right toe-brachial index  is normal. Final [facility] Lane [id number] 04/17/2021 Left: Resting left ankle-brachial index is within normal range. No evidence of significant left lower extremity arterial disease. The left toe-brachial index is normal.  TTE 05/19/20: IMPRESSIONS   1. Intracavitary gradient at rest. Peak velocity 1.87 m/s. Peak gradient  14 mmHg. Peak gradient increases to 36 mmHg and peak velocity increases to  3 m/s with Valsalva. Left ventricular ejection fraction, by estimation, is  70 to 75%. The left ventricle  has hyperdynamic function. The left ventricle has no regional wall motion  abnormalities. Left ventricular diastolic parameters are consistent with  Grade I diastolic dysfunction (impaired relaxation).   2. Right ventricular systolic function is normal. The right ventricular  size is normal.   3. The mitral valve is normal in structure. No evidence of mitral valve  regurgitation. No evidence of mitral stenosis.   4. The aortic valve is normal in structure. Aortic valve regurgitation is  not visualized. No aortic stenosis is present.   5. The inferior vena cava is  normal in size with greater than 50%  respiratory variability, suggesting right atrial pressure of 3 mmHg.   Comparison(s): 06/23/13 EF 60-65%.   Cardiac Monitor 04/21/20: Patch wear time 13 days and 17 hours Predominant rhythm is NSR with average HR 70; ranging from 50-187bpm There were 55 episodes of SVT with longest lasting 13.2 seconds at rate 187bpm (also the fastest episode); did not correlate with patient triggered event. Isolated SVEs and PVCs were rate Ventricular bigeminy and trigeminy were present No Afib, significant pauses or VT Patch Wear Time:  13 days and 17 hours (2022-02-17T15:00:04-498 to 2022-03-03T08:36:54-0500)   Patient had a min HR of 50 bpm, max HR of 187 bpm, and avg HR of 70 bpm. Predominant underlying rhythm was Sinus Rhythm. 55 Supraventricular Tachycardia runs occurred, the run with the fastest interval lasting 13.2 secs with a max rate of 187 bpm (avg  149 bpm); the run with the fastest interval was also the longest. Idioventricular Rhythm was present. Ectopic Atrial Rhythm was present. Isolated SVEs were rare (<1.0%), SVE Couplets were rare (<1.0%), and SVE Triplets were rare (<1.0%). Isolated VEs  were rare (<1.0%, 5778), VE Couplets were rare (<1.0%, 36), and VE Triplets were rare (<1.0%, 1). Ventricular Bigeminy and Trigeminy were present.   Renal Artery [place] 06/2013: Technically challenging study. Normal caliber abdominal aorta. Normal and symmetrical kidney size. Normal renal arteries, bilaterally. Incidental finding of right kidney, lower pole cyst, measures 2.8 cm x 2.8 cm x 2.7 cm. The IVC and renal veins are patent.  TTE 06/23/13: ------------------------------------------------------------  Left ventricle:  The cavity size was normal. Wall thickness  was increased in a pattern of mild LVH. There was focal  basal hypertrophy. Systolic function was normal. The  estimated ejection fraction was in the range of 60% to 65%.  Wall motion was normal;  there were no regional wall motion  abnormalities. Early diastolic septal annular tissue Doppler  velocities Ea were abnormal.   ------------------------------------------------------------  Aortic valve:   Mildly to moderately calcified annulus.  Doppler:   There was no stenosis.    No regurgitation.   ------------------------------------------------------------  Aorta:  Aortic root: The aortic root was normal in size.  Ascending aorta: The ascending aorta was normal in size.   ------------------------------------------------------------  Mitral valve:   Structurally normal valve.   Leaflet  separation was normal.  Doppler:  Transvalvular velocity was  within the normal range. There was no evidence for stenosis.  No regurgitation.    Peak gradient: 62m Hg (D).   ------------------------------------------------------------  Left atrium:  The atrium was normal in size.   ------------------------------------------------------------  Right ventricle:  The cavity size was normal. Systolic  function was normal.   ------------------------------------------------------------  Pulmonic valve:    Structurally normal valve.   Cusp  separation was normal.  Doppler:  Transvalvular velocity was  within the normal range.  No regurgitation.   ------------------------------------------------------------  Tricuspid valve:   Structurally normal valve.    Doppler:  Mild regurgitation.   ------------------------------------------------------------  Right atrium:  The atrium was normal in size.   ------------------------------------------------------------  Pericardium:  There was no pericardial effusion.   ------------------------------------------------------------  Systemic veins:  Inferior vena cava: The vessel was normal in size; the  respirophasic diameter changes were in the normal range (=  50%); findings are consistent with normal central venous  pressure.    Coronary  CTA: FINDINGS: Cardiovascular: Limited study primarily due to bolus dispersion and intermittent respiratory motion. No evidence of central or lobar pulmonary embolism. No acute aortic finding. There is multifocal atherosclerotic calcification of the aorta and coronaries. Normal heart size. No pericardial effusion   Mediastinum/Nodes: Negative for adenopathy or mass   Lungs/Pleura: There is no edema, consolidation, effusion, or pneumothorax. Mild dependent atelectasis.   Upper Abdomen: Negative   Musculoskeletal: Spondylosis and generalized degenerative disease. 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 Osteopenia   Review of the MIP images confirms the above findings.   IMPRESSION: 1. Very limited pulmonary artery evaluation. No evidence of main or lobar pulmonary embolism. 2. Aortic and coronary atherosclerosis.  EKG:  EKG is personally reviewed. 12/27/2021: NSR, PACs, 93 bpm 07/04/2020: EKG is not ordered today. 03/25/2020: NSR with PVCs HR 69  Recent Labs: No results found for requested labs within last 365 days.  Recent Lipid Panel    Component Value Date/Time   CHOL 82 (L) 10/24/2020 1425   TRIG 153 (H) 10/24/2020 1425   HDL 24 (L) 10/24/2020 1425   CHOLHDL 3.4 10/24/2020 1425   LDLCALC 32 10/24/2020 1425     Physical Exam:    VS:  BP 130/80   Pulse 93   Ht '5\' 5"'$  (1.651 m)   Wt 210 lb 4.8 oz (95.4 kg)   SpO2 97%   BMI 35.00 kg/m     Wt Readings from Last 3 Encounters:  12/27/21 210 lb 4.8 oz (95.4 kg)  11/01/20 208 lb (94.3 kg)  07/04/20 211 lb (95.7 kg)     GEN:  Comfortable elderly male, sitting in a wheelchair, NAd HEENT: Normal NECK: No JVD; No carotid bruits CARDIAC: Irregular, no murmurs, rubs, gallops RESPIRATORY:  Clear to auscultation without rales, wheezing or rhonchi  ABDOMEN: Soft, non-tender, non-distended MUSCULOSKELETAL:  No edema; No deformity  SKIN: Warm and dry NEUROLOGIC:  Alert and oriented x 3 PSYCHIATRIC:  Normal  affect   ASSESSMENT:    1. Paroxysmal SVT (supraventricular tachycardia)   2. Coronary artery disease involving native coronary artery of native heart without angina pectoris   3. Essential hypertension   4. Palpitations   5. Coronary artery disease involving native heart without angina pectoris, unspecified vessel or lesion type   6. Mixed hyperlipidemia   7. Primary hypertension   8. Chronic obstructive pulmonary disease, unspecified COPD type ([place]     PLAN:    In order of problems listed above:  #Palpitations: #SVT: Patient with episode of heart pounding/jumping out of his chest in late Jan that lasted for  about 1 day. Had some associated lightheadedness. Went to [employer] a couple days later and was told his HR was elevated as well as his BP. Obatined cardiac monitor that showed 55 runs of nonsustained SVT but no afib or sustained arrhythmias. TTE with hyperdynamic function and G1DD, no valve disease. Palpitations significantly improved on metoprolol. -Continue metoprolol '100mg'$  XL daily (will refill today)  #CAD with coronary calcification on CT chest: No current anginal symptoms.  -Continue repatha '140mg'$  q14d -Continue home metoprolol '100mg'$  XL daily  #HTN: Well controlled today. -Continue home metoprolol '100mg'$  XL daily and losartan '100mg'$  daily  #HLD: -Continue repatha '140mg'$  q14d  #COPD: -Management per PCP  #Peripheral Neuropathy: #Joint Pain: Followed by PCP.. -Continue management per PCP  #PAD:  Follows with vascular. Recommended for continued medical management at this time. -Follow-up with vascular as scheduled  Follow-up: 74month  Medication Adjustments/Labs and Tests Ordered: Current medicines are reviewed at length with the patient today.  Concerns regarding medicines are outlined above.  Orders Placed This Encounter  Procedures   EKG 12-Lead   Meds ordered this encounter  Medications   TOPROL XL 100 MG 24 hr tablet    Sig: Take 1 tablet (100 mg  total) by mouth daily. Take with or immediately following a meal.    Dispense:  90 tablet    Refill:  3    Pt wants name brand only. Fill brand name.    Patient Instructions  Medication Instructions:   Your physician recommends that you continue on your current medications as directed. Please refer to the Current Medication list given to you today.  *If you need a refill on your cardiac medications before your next appointment, please call your pharmacy*    Follow-Up: [facility] and [facility] needs [facility] our [facility].  As part of our continuing mission to provide you with exceptional heart care, we have created designated Provider Care Teams.  These Care Teams include your primary Cardiologist (physician) and Advanced Practice Providers (APPs -  Physician Assistants and Nurse Practitioners) who all work together to provide you with the care you need, when you need it.  We recommend signing up for the patient portal called "MyChart".  Sign up information is provided on this After Visit Summary.  MyChart is used to connect with patients for Virtual Visits (Telemedicine).  Patients are able to view lab/test results, encounter notes, upcoming appointments, etc.  Non-urgent messages can be sent to your provider as well.   To learn more about what you can do with MyChart, go to [email or web address]    Your next appointment:   6 month(s)  The format for your next appointment:   In Person  Provider:   HFreada Bergeron MD     Important Information About SWest Felicianaas a scribe for HFreada Bergeron MD.,have documented all relevant documentation on the behalf of HFreada Bergeron MD,as directed by  HFreada Bergeron MD while in the presence of HFreada Bergeron MD.   I, HFreada Bergeron MD, have reviewed all documentation for this visit. The documentation on 12/27/21 for the exam, diagnosis, procedures, and orders  are all accurate and complete.   Signed, HFreada Bergeron MD  12/27/2021 4:14 PM    [facility]

## 2022-02-07 ENCOUNTER — Ambulatory Visit: Payer: Medicare Other | Admitting: Podiatry

## 2022-02-22 ENCOUNTER — Other Ambulatory Visit (HOSPITAL_COMMUNITY): Payer: Self-pay

## 2022-03-12 ENCOUNTER — Telehealth: Payer: Self-pay | Admitting: Nurse Practitioner

## 2022-03-12 NOTE — Telephone Encounter (Signed)
LVM ON 1-29-20204 to sch a New patient appt

## 2022-03-12 NOTE — Telephone Encounter (Signed)
Pt is calling to schedule a new patient appt with Dr. Rosana Berger. CB- [phone]

## 2022-04-27 ENCOUNTER — Emergency Department
Admission: EM | Admit: 2022-04-27 | Discharge: 2022-04-27 | Disposition: A | Discharge status: Home / Self Care | Payer: Medicare HMO | Attending: Emergency Medicine | Admitting: Emergency Medicine

## 2022-04-27 ENCOUNTER — Emergency Department: Payer: Medicare HMO

## 2022-04-27 ENCOUNTER — Other Ambulatory Visit: Payer: Self-pay

## 2022-04-27 ENCOUNTER — Encounter: Payer: Self-pay | Admitting: Emergency Medicine

## 2022-04-27 DIAGNOSIS — I10 Essential (primary) hypertension: Secondary | ICD-10-CM | POA: Diagnosis not present

## 2022-04-27 DIAGNOSIS — J449 Chronic obstructive pulmonary disease, unspecified: Secondary | ICD-10-CM | POA: Insufficient documentation

## 2022-04-27 DIAGNOSIS — S299XXA Unspecified injury of thorax, initial encounter: Secondary | ICD-10-CM | POA: Diagnosis present

## 2022-04-27 DIAGNOSIS — I2510 Atherosclerotic heart disease of native coronary artery without angina pectoris: Secondary | ICD-10-CM | POA: Insufficient documentation

## 2022-04-27 DIAGNOSIS — S20212A Contusion of left front wall of thorax, initial encounter: Secondary | ICD-10-CM | POA: Diagnosis not present

## 2022-04-27 DIAGNOSIS — S2242XA Multiple fractures of ribs, left side, initial encounter for closed fracture: Secondary | ICD-10-CM | POA: Diagnosis not present

## 2022-04-27 DIAGNOSIS — R0781 Pleurodynia: Secondary | ICD-10-CM | POA: Diagnosis not present

## 2022-04-27 MED ORDER — TRAMADOL HCL 50 MG PO TABS: 50.0000 mg | ORAL_TABLET | Freq: Three times a day (TID) | ORAL | 0 refills | Status: AC | PRN

## 2022-04-27 MED ORDER — ONDANSETRON 4 MG PO TBDP: 4.0000 mg | ORAL_TABLET | Freq: Three times a day (TID) | ORAL | 0 refills | Status: DC | PRN

## 2022-04-27 MED ORDER — HYDROCODONE-ACETAMINOPHEN 5-325 MG PO TABS
1.0000 | ORAL_TABLET | Freq: Once | ORAL | Status: AC
  Administered 2022-04-27: 1 via ORAL
  Filled 2022-04-27: qty 1

## 2022-04-27 MED ORDER — LIDOCAINE 5 % EX PTCH: 1.0000 | MEDICATED_PATCH | Freq: Two times a day (BID) | CUTANEOUS | 0 refills | Status: AC | PRN

## 2022-04-27 MED ORDER — LIDOCAINE 5 % EX PTCH
1.0000 | MEDICATED_PATCH | CUTANEOUS | Status: DC
  Administered 2022-04-27: 1 via TRANSDERMAL
  Filled 2022-04-27: qty 1

## 2022-04-27 MED ORDER — ONDANSETRON 4 MG PO TBDP
4.0000 mg | ORAL_TABLET | Freq: Once | ORAL | Status: AC
  Administered 2022-04-27: 4 mg via ORAL
  Filled 2022-04-27: qty 1
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
## 2022-04-27 NOTE — Discharge Instructions (Addendum)
Your exam and x-ray revealed 2 rib fractures on the left side.  Wear the lidocaine patches every 12 hours as needed for pain.  You not wearing the pain patch, you can apply ice and/or moist heat to the chest wall to help reduce pain.  Take the prescription pain medicine along with the nausea medicine every 8 hours as needed.  Follow-up with your primary provider or return to the ED if needed.

## 2022-05-09 DIAGNOSIS — M791 Myalgia, unspecified site: Secondary | ICD-10-CM | POA: Diagnosis not present

## 2022-05-09 DIAGNOSIS — S2232XA Fracture of one rib, left side, initial encounter for closed fracture: Secondary | ICD-10-CM | POA: Diagnosis not present

## 2022-05-09 DIAGNOSIS — R0782 Intercostal pain: Secondary | ICD-10-CM | POA: Diagnosis not present

## 2022-06-06 ENCOUNTER — Telehealth: Payer: Self-pay | Admitting: Physician Assistant

## 2022-06-07 ENCOUNTER — Ambulatory Visit (INDEPENDENT_AMBULATORY_CARE_PROVIDER_SITE_OTHER): Payer: Medicare HMO | Admitting: Physician Assistant

## 2022-06-07 ENCOUNTER — Telehealth: Payer: Self-pay | Admitting: Physician Assistant

## 2022-06-07 ENCOUNTER — Encounter: Payer: Self-pay | Admitting: Physician Assistant

## 2022-06-07 VITALS — BP 155/90 | HR 65

## 2022-06-07 DIAGNOSIS — E039 Hypothyroidism, unspecified: Secondary | ICD-10-CM

## 2022-06-07 DIAGNOSIS — N401 Enlarged prostate with lower urinary tract symptoms: Secondary | ICD-10-CM

## 2022-06-07 DIAGNOSIS — E7800 Pure hypercholesterolemia, unspecified: Secondary | ICD-10-CM | POA: Diagnosis not present

## 2022-06-07 DIAGNOSIS — I129 Hypertensive chronic kidney disease with stage 1 through stage 4 chronic kidney disease, or unspecified chronic kidney disease: Secondary | ICD-10-CM

## 2022-06-07 DIAGNOSIS — R350 Frequency of micturition: Secondary | ICD-10-CM

## 2022-06-07 DIAGNOSIS — M1A9XX0 Chronic gout, unspecified, without tophus (tophi): Secondary | ICD-10-CM

## 2022-06-07 DIAGNOSIS — I471 Supraventricular tachycardia, unspecified: Secondary | ICD-10-CM

## 2022-06-07 DIAGNOSIS — I10 Essential (primary) hypertension: Secondary | ICD-10-CM

## 2022-06-07 DIAGNOSIS — I2510 Atherosclerotic heart disease of native coronary artery without angina pectoris: Secondary | ICD-10-CM | POA: Diagnosis not present

## 2022-06-07 NOTE — Assessment & Plan Note (Signed)
Not on a statin was on repatha Recheck lipids

## 2022-06-07 NOTE — Assessment & Plan Note (Signed)
Pt was managed on repatha but sounds like he has not in quite a while Will order lipids today

## 2022-06-07 NOTE — Progress Notes (Signed)
I,Sha'taria Tyson,acting as a Neurosurgeon for [employer], PA-C.,have documented all relevant documentation on the behalf of Alfredia Ferguson, PA-C,as directed by  Alfredia Ferguson, PA-C while in the presence of Alfredia Ferguson, PA-C.  New patient visit   Patient: Bobby Lane   DOB: 08/02/1940   82 y.o. Male  MRN: [id number] Visit Date: 06/07/2022  Today's healthcare provider: Alfredia Ferguson, PA-C   Cc. New patient establish care  Subjective    Bobby Lane is a 82 y.o. male who presents today as a new patient to establish care.  HPI  Constipation -some bleeding because its so hard. Intermittent. He will eat some eggrolls and it will improve.  Reports it has been a long time since he has had the repatha and he didn't know what it was for.  Urinary frequency and urgency, worsening recently. Not incontinent but urgent.  Past Medical History:  Diagnosis Date   Allergy    Arthritis    Cancer    SKIN   COPD (chronic obstructive pulmonary disease)    Coronary artery disease    Depression    Dyspnea    DOE   GERD (gastroesophageal reflux disease)    Gout    Heart murmur    History of hiatal hernia    History of kidney stones    History of wheezing    HOH (hard of hearing)    Hypertension    Hypothyroidism    Oxygen deficit    PRN USE; used ~ 2014 for less than a year (no longer has as of 07/15/19)   Pneumonia    Renal disorder    Thyroid disease    Past Surgical History:  Procedure Laterality Date   ANTERIOR CERVICAL DECOMP/DISCECTOMY FUSION N/A 07/16/2019   Procedure: Cervical three-four Cervical five-six Cervical six-seven Anterior cervical decompression/discectomy/fusion;  Surgeon: Maeola Harman, MD;  Location: [facility] OR;  Service: Neurosurgery;  Laterality: N/A;  3C   CARDIAC CATHETERIZATION     CARPAL TUNNEL RELEASE Left 07/16/2019   Procedure: LEFT CARPAL TUNNEL RELEASE;  Surgeon: Maeola Harman, MD;  Location: [facility] OR;  Service: Neurosurgery;  Laterality: Left;   CATARACT  EXTRACTION W/PHACO Right 08/21/2016   Procedure: CATARACT EXTRACTION PHACO AND INTRAOCULAR LENS PLACEMENT (IOC);  Surgeon: Galen Manila, MD;  Location: [facility] ORS;  Service: Ophthalmology;  Laterality: Right;  [place] 00:54.9 AP% 18.3 CDE 10.04 Fluid pack lot # [id number]   CATARACT EXTRACTION W/PHACO Left 09/18/2016   Procedure: CATARACT EXTRACTION PHACO AND INTRAOCULAR LENS PLACEMENT (IOC);  Surgeon: Galen Manila, MD;  Location: [facility] ORS;  Service: Ophthalmology;  Laterality: Left;  [place] 00:56 AP% 19.7 CDE 11.07 Fluid pack lot # [id number]   FRACTURE SURGERY     ANKLE   HERNIA REPAIR     UPPER GI ENDOSCOPY     Family Status  Relation Name Status   Mother  Deceased   Father  Deceased   Oneal Grout  (Not Specified)   Neg Hx  (Not Specified)   Family History  Problem Relation Age of Onset   Brain cancer Father    Cancer Paternal Uncle    Prostate cancer Neg Hx    Bladder Cancer Neg Hx    Kidney cancer Neg Hx    Social History   Socioeconomic History   Marital status: Single    Spouse name: Not on file   Number of children: Not on file   Years of education: Not on file   Highest education level: Not on file  Occupational History   Not on file  Tobacco Use   Smoking status: Never   Smokeless tobacco: Never  Vaping Use   Vaping Use: Never used  Substance and Sexual Activity   Alcohol use: No   Drug use: No   Sexual activity: Not Currently  Other Topics Concern   Not on file  Social History Narrative   Not on file   Social Determinants of Health   Financial Resource Strain: Not on file  Food Insecurity: Not on file  Transportation Needs: Not on file  Physical Activity: Not on file  Stress: Not on file  Social Connections: Not on file   Outpatient Medications Prior to Visit  Medication Sig   acetaminophen (TYLENOL) 650 MG CR tablet Take 1,300 mg by mouth every 8 (eight) hours as needed for pain.   allopurinol (ZYLOPRIM) 300 MG tablet Take by mouth.    Cholecalciferol (VITAMIN D3) 5000 UNITS TABS Take 5,000 Units by mouth daily.   cyanocobalamin 1000 MCG tablet Take 1,000 mcg by mouth daily.   DULoxetine (CYMBALTA) 30 MG capsule Take 30 mg by mouth 2 (two) times daily.   GEMTESA 75 MG TABS Take 1 tablet by mouth daily.   levothyroxine (SYNTHROID) 100 MCG tablet Take 100 mcg by mouth daily before breakfast.   losartan (COZAAR) 100 MG tablet Take 1 tablet (100 mg total) by mouth daily.   tamsulosin (FLOMAX) 0.4 MG CAPS capsule Take 1 capsule (0.4 mg total) by mouth daily.   TOPROL XL 100 MG 24 hr tablet Take 1 tablet (100 mg total) by mouth daily. Take with or immediately following a meal.   albuterol (VENTOLIN HFA) 108 (90 Base) MCG/ACT inhaler SMARTSIG:2 Puff(s) By Mouth Every 4 Hours PRN (Patient not taking: Reported on 06/07/2022)   finasteride (PROSCAR) 5 MG tablet Take 1 tablet (5 mg total) by mouth daily.   REPATHA SURECLICK 140 MG/ML SOAJ Inject 1 mL into the skin every 14 (fourteen) days. (Patient not taking: Reported on 12/27/2021)   [DISCONTINUED] clonazePAM (KLONOPIN) 0.5 MG tablet Take 0.5 mg by mouth at bedtime as needed. (Patient not taking: Reported on 12/27/2021)   [DISCONTINUED] diclofenac Sodium (VOLTAREN) 1 % GEL  (Patient not taking: Reported on 12/27/2021)   [DISCONTINUED] ondansetron (ZOFRAN-ODT) 4 MG disintegrating tablet Take 1 tablet (4 mg total) by mouth every 8 (eight) hours as needed for nausea or vomiting. (Patient not taking: Reported on 06/07/2022)   [DISCONTINUED] spironolactone (ALDACTONE) 25 MG tablet Take 12.5 mg by mouth every other day. (Patient not taking: Reported on 12/27/2021)   [DISCONTINUED] traMADol (ULTRAM) 50 MG tablet Take 50 mg by mouth 3 (three) times daily as needed. (Patient not taking: Reported on 06/07/2022)   No facility-administered medications prior to visit.   Allergies  Allergen Reactions   Codeine Nausea And Vomiting   Morphine And Related Nausea And Vomiting   Novocain [Procaine] Hives     NO TROUBLE IN OR   Other Nausea And Vomiting   Percocet [Oxycodone-Acetaminophen] Nausea And Vomiting   Sulfa Antibiotics Hives   Acetaminophen    Atorvastatin    Rosuvastatin    Tramadol Nausea And Vomiting    Note: upset stomach Note: upset stomach     Immunization History  Administered Date(s) Administered   Moderna Sars-Covid-2 Vaccination 06/06/2019, 07/04/2019, 02/16/2020   Tdap 11/12/2017    Health Maintenance  Topic Date Due   Medicare Annual Wellness (AWV)  Never done   Zoster Vaccines- Shingrix (1 of 2) Never done  Pneumonia Vaccine 17+ Years old (1 of 1 - PCV) Never done   COVID-19 Vaccine (4 - 2023-24 season) 10/13/2021   INFLUENZA VACCINE  09/13/2022   DTaP/Tdap/Td (2 - Td or Tdap) 11/13/2027   HPV VACCINES  Aged Out    Patient Care Team: Alfredia Ferguson, PA-C as PCP - General (Physician Assistant) Meriam Sprague, MD as PCP - Cardiology (Cardiology)  Review of Systems  HENT:  Positive for dental problem, hearing loss, rhinorrhea, sinus pressure and sneezing.   Eyes:  Positive for photophobia.  Cardiovascular:  Positive for leg swelling.  Gastrointestinal:  Positive for constipation.  Musculoskeletal:  Positive for arthralgias, joint swelling and myalgias.  Hematological:  Bruises/bleeds easily.    Objective    BP (!) 155/90 (BP Location: Right Arm, Patient Position: Sitting, Cuff Size: Normal)   Pulse 65   SpO2 99%   Physical Exam Constitutional:      General: He is awake.     Appearance: He is well-developed. He is obese.  HENT:     Head: Normocephalic.  Eyes:     Conjunctiva/sclera: Conjunctivae normal.  Cardiovascular:     Rate and Rhythm: Normal rate and regular rhythm.     Heart sounds: Normal heart sounds.  Pulmonary:     Effort: Pulmonary effort is normal.     Breath sounds: Normal breath sounds.  Skin:    General: Skin is warm.  Neurological:     Mental Status: He is alert and oriented to person, place, and time.   Psychiatric:        Attention and Perception: Attention normal.        Mood and Affect: Mood normal.        Speech: Speech normal.        Behavior: Behavior is cooperative.     Depression Screen    06/07/2022    3:25 PM 10/16/2019    1:00 PM 01/26/2019    4:29 PM  PHQ 2/9 Scores  PHQ - 2 Score 0 2 0  PHQ- 9 Score 0 7 4   No results found for any visits on 06/07/22.  Assessment & Plan      Problem List Items Addressed This Visit       Cardiovascular and Mediastinum   Coronary artery disease    Pt was managed on repatha but sounds like he has not in quite a while Will order lipids today      Relevant Orders   CBC w/Diff/Platelet   Comprehensive Metabolic Panel (CMET)   Lipid Profile   AMB Referral to Community Care Coordin[facility] ([facility] Patients)   Hypertension    Elevated in office. Compliant with losartan and metoprolol Given cardiac history def to cardio for management. Pt has f/u next mo Ordered cmp      Relevant Orders   CBC w/Diff/Platelet   Comprehensive Metabolic Panel (CMET)   AMB Referral to Community Care Coordinaton ([facility] Patients)   Paroxysmal SVT (supraventricular tachycardia)    F/b cardio. Pt compliant with metoprolol      Relevant Orders   CBC w/Diff/Platelet   Comprehensive Metabolic Panel (CMET)   AMB Referral to Community Care Coordinaton ([facility] Patients)     Endocrine   Hypothyroidism (acquired) - Primary    Managed on levothyroxine Will check tsh/t4 today      Relevant Orders   TSH + free T4   AMB Referral to Community Care Coordinaton ([facility] Patients)     Genitourinary   Hypertensive renal disease  Historically Ordered cmp        Enlarged prostate with lower urinary tract symptoms (LUTS)    Dx on chart. Pt urinary symptoms seem severe despite multiple meds Ref to urology      Relevant Orders   Ambulatory referral to Urology     Other   Gout    Managed on allopurinol Will check cmp/uric acid      Relevant Orders   Uric  acid   AMB Referral to Community Care Coordinaton ([facility] Patients)   Hypercholesterolemia    Not on a statin was on repatha Recheck lipids      Relevant Orders   HgB A1c   Lipid Profile   AMB Referral to Community Care Coordinaton ([facility] Patients)    Discussed w/ pt referring to pharmacy for assistance w/ med management and adherence.   Return in about 6 months (around 12/07/2022) for chronic conditions; pending labs.     I, Alfredia Ferguson, PA-C have reviewed all documentation for this visit. The documentation on  06/07/22   for the exam, diagnosis, procedures, and orders are all accurate and complete.  Alfredia Ferguson, PA-C [facility] [address] [place], [place], [address] Office: [phone] Fax: [phone]   [facility]

## 2022-06-07 NOTE — Assessment & Plan Note (Signed)
Historically Ordered cmp

## 2022-06-07 NOTE — Assessment & Plan Note (Addendum)
Dx on chart. Pt urinary symptoms seem severe despite multiple meds Ref to urology

## 2022-06-07 NOTE — Telephone Encounter (Signed)
Pt is out of his Allopurinol 300 mg  he was in the office today and forgot to tell Alfredia Ferguson.  This was his first visit .

## 2022-06-07 NOTE — Assessment & Plan Note (Signed)
Elevated in office. Compliant with losartan and metoprolol Given cardiac history def to cardio for management. Pt has f/u next mo Ordered cmp

## 2022-06-07 NOTE — Assessment & Plan Note (Signed)
Managed on allopurinol Will check cmp/uric acid

## 2022-06-07 NOTE — Assessment & Plan Note (Signed)
Managed on levothyroxine Will check tsh/t4 today

## 2022-06-07 NOTE — Assessment & Plan Note (Signed)
F/b cardio. Pt compliant with metoprolol

## 2022-06-08 ENCOUNTER — Telehealth: Payer: Self-pay

## 2022-06-08 ENCOUNTER — Other Ambulatory Visit: Payer: Self-pay | Admitting: Physician Assistant

## 2022-06-08 DIAGNOSIS — E039 Hypothyroidism, unspecified: Secondary | ICD-10-CM

## 2022-06-08 DIAGNOSIS — E875 Hyperkalemia: Secondary | ICD-10-CM

## 2022-06-08 LAB — CBC WITH DIFFERENTIAL/PLATELET
Basophils Absolute: 0.1 10*3/uL (ref 0.0–0.2)
Basos: 1 %
EOS (ABSOLUTE): 0.1 10*3/uL (ref 0.0–0.4)
Eos: 2 %
Hematocrit: 44.9 % (ref 37.5–51.0)
Hemoglobin: 14.9 g/dL (ref 13.0–17.7)
Immature Grans (Abs): 0.1 10*3/uL (ref 0.0–0.1)
Immature Granulocytes: 1 %
Lymphocytes Absolute: 2.4 10*3/uL (ref 0.7–3.1)
Lymphs: 32 %
MCH: 33 pg (ref 26.6–33.0)
MCHC: 33.2 g/dL (ref 31.5–35.7)
MCV: 100 fL — ABNORMAL HIGH (ref 79–97)
Monocytes Absolute: 0.7 10*3/uL (ref 0.1–0.9)
Monocytes: 10 %
Neutrophils Absolute: 4.1 10*3/uL (ref 1.4–7.0)
Neutrophils: 54 %
Platelets: 247 10*3/uL (ref 150–450)
RBC: 4.51 x10E6/uL (ref 4.14–5.80)
RDW: 14 % (ref 11.6–15.4)
WBC: 7.4 10*3/uL (ref 3.4–10.8)

## 2022-06-08 LAB — COMPREHENSIVE METABOLIC PANEL
ALT: 19 IU/L (ref 0–44)
AST: 21 IU/L (ref 0–40)
Albumin/Globulin Ratio: 1.8 (ref 1.2–2.2)
Albumin: 4.7 g/dL (ref 3.8–4.8)
Alkaline Phosphatase: 69 IU/L (ref 44–121)
BUN/Creatinine Ratio: 18 (ref 10–24)
BUN: 27 mg/dL (ref 8–27)
Bilirubin Total: 0.5 mg/dL (ref 0.0–1.2)
CO2: 24 mmol/L (ref 20–29)
Calcium: 10.4 mg/dL — ABNORMAL HIGH (ref 8.6–10.2)
Chloride: 99 mmol/L (ref 96–106)
Creatinine, Ser: 1.54 mg/dL — ABNORMAL HIGH (ref 0.76–1.27)
Globulin, Total: 2.6 g/dL (ref 1.5–4.5)
Glucose: 84 mg/dL (ref 70–99)
Potassium: 5.6 mmol/L — ABNORMAL HIGH (ref 3.5–5.2)
Sodium: 138 mmol/L (ref 134–144)
Total Protein: 7.3 g/dL (ref 6.0–8.5)
eGFR: 45 mL/min/{1.73_m2} — ABNORMAL LOW (ref >59)

## 2022-06-08 LAB — LIPID PANEL
Chol/HDL Ratio: 8.2 ratio — ABNORMAL HIGH (ref 0.0–5.0)
Cholesterol, Total: 213 mg/dL — ABNORMAL HIGH (ref 100–199)
HDL: 26 mg/dL — ABNORMAL LOW (ref >39)
LDL Chol Calc (NIH): 137 mg/dL — ABNORMAL HIGH (ref 0–99)
Triglycerides: 273 mg/dL — ABNORMAL HIGH (ref 0–149)
VLDL Cholesterol Cal: 50 mg/dL — ABNORMAL HIGH (ref 5–40)

## 2022-06-08 LAB — TSH+FREE T4
Free T4: 1.08 ng/dL (ref 0.82–1.77)
TSH: 7.58 u[IU]/mL — ABNORMAL HIGH (ref 0.450–4.500)

## 2022-06-08 LAB — URIC ACID: Uric Acid: 7.3 mg/dL (ref 3.8–8.4)

## 2022-06-08 LAB — HEMOGLOBIN A1C
Est. average glucose Bld gHb Est-mCnc: 105 mg/dL
Hgb A1c MFr Bld: 5.3 % (ref 4.8–5.6)

## 2022-06-08 MED ORDER — LEVOTHYROXINE SODIUM 112 MCG PO TABS
112.0000 ug | ORAL_TABLET | Freq: Every day | ORAL | 3 refills | Status: DC
Start: 2022-06-08 — End: 2022-06-12

## 2022-06-08 NOTE — Progress Notes (Signed)
Care Management & Coordination Services Pharmacy Team  Reason for Encounter: Appointment Reminder  Contacted patient to confirm in office appointment with Julious Payer, PharmD on 06/11/2022 at 11:00 am.  Spoke with patient on 06/08/2022   Do you have any problems getting your medications? No Patient denies any issue getting his medications.  What is your top health concern you would like to discuss at your upcoming visit?  Overall health, patient denies any health concerns at this time.  Have you seen any other providers since your last visit with PCP? No   Chart review:  Recent office visits:  06/07/2022 Alfredia Ferguson PA-C (PCP) No medication changes noted, AMB Referral to [facility] , Ambulatory referral to Urology ,Return in about 6 months   Recent consult visits:  12/27/2021 Dr. Shari Prows MD (Cardiology) No Medication changes noted  Hospital visits:  Medication Reconciliation was completed by comparing discharge summary, patient's EMR and Pharmacy list, and upon discussion with patient.  Admitted to the hospital on 04/27/2022 due to Closed Fracture of multiple ribs of left side. Discharge date was 04/27/2022. Discharged from [facility].    New?Medications Started at [facility] Discharge:?? -started Tramadol 50 MG PRN -Started Zofran 4 mg PRN -Started lidocaine PRN  Medication Changes at Hospital Discharge: -Changed None ID  Medications Discontinued at Hospital Discharge: -Stopped None ID  Medications that remain the same after Hospital Discharge:??  -All other medications will remain the same.     Star Rating Drugs:  Medication:Losartan 100 mg Last Fill: 05/15/2022 90 Day Supply at [employer].    Care Gaps: Annual wellness visit in last year? No Never completed Shingrix Vaccine Pneumonia Vaccine COVID-19 Vaccine HTN- 155-90- 06/07/2022   Everlean Cherry Clinical Pharmacist Assistant [phone]

## 2022-06-11 ENCOUNTER — Ambulatory Visit: Payer: Medicare HMO

## 2022-06-11 DIAGNOSIS — I70213 Atherosclerosis of native arteries of extremities with intermittent claudication, bilateral legs: Secondary | ICD-10-CM

## 2022-06-11 DIAGNOSIS — E039 Hypothyroidism, unspecified: Secondary | ICD-10-CM

## 2022-06-11 DIAGNOSIS — E7800 Pure hypercholesterolemia, unspecified: Secondary | ICD-10-CM

## 2022-06-11 DIAGNOSIS — M1A09X0 Idiopathic chronic gout, multiple sites, without tophus (tophi): Secondary | ICD-10-CM

## 2022-06-11 NOTE — Progress Notes (Unsigned)
Care Management & Coordination Services Pharmacy Note  06/11/2022 Name:  Bobby Lane MRN:  [id number] DOB:  1940-04-08  Summary: Patient presents for initial pharmacy consult.   -Patient has not been taking Repatha, he denies any financial barries with the medication, stating that the "pharmacy stopped filling" his Repatha.   -Patient has not been taking Tamsulosin or finasteride. Reports his current symptoms are primarily urinary urgency exacerbated due to limited mobility. He keeps a commode by his bedside.    -Patient reports that his allopurinol was previously decreased from 300 mg daily to 200 mg daily, which he feels is no longer working well for him.   -Patient has been taking levothyroxine 112 mcg for >3 months.   Recommendations/Changes made from today's visit: -INCREASE levothyroxine to 125 mcg daily  -Increase allopurinol to 300 mg daily  -RESTART Repatha 140 mg every 14 days   Follow up plan: CPP follow-up 3 months  Subjective: Bobby Lane is an 82 y.o. year old male who is a primary patient of Alfredia Ferguson, [place].  The care coordination team was consulted for assistance with disease management and care coordination needs.    Engaged with patient by telephone for initial visit.  Recent office visits: 06/07/2022 Alfredia Ferguson PA-C (PCP) No medication changes noted, AMB Referral to [facility] , Ambulatory referral to Urology ,Return in about 6 months   Recent consult visits: 12/27/2021 Dr. Shari Prows MD (Cardiology) No Medication changes noted   Hospital visits: Admitted to the hospital on 04/27/2022 due to Closed Fracture of multiple ribs of left side. Discharge date was 04/27/2022. Discharged from [facility].     Objective:  Lab Results  Component Value Date   CREATININE 1.54 (H) 06/07/2022   BUN 27 06/07/2022   EGFR 45 (L) 06/07/2022   GFRNONAA 42 (L) 07/14/2019   GFRAA 49 (L) 07/14/2019   NA 138 06/07/2022   K 5.6 (H)  06/07/2022   CALCIUM 10.4 (H) 06/07/2022   CO2 24 06/07/2022   GLUCOSE 84 06/07/2022    Lab Results  Component Value Date/Time   HGBA1C 5.3 06/07/2022 03:56 PM   HGBA1C 5.0 11/19/2018 08:27 AM    Last diabetic Eye exam: No results found for: "HMDIABEYEEXA"  Last diabetic Foot exam: No results found for: "HMDIABFOOTEX"   Lab Results  Component Value Date   CHOL 213 (H) 06/07/2022   HDL 26 (L) 06/07/2022   LDLCALC 137 (H) 06/07/2022   TRIG 273 (H) 06/07/2022   CHOLHDL 8.2 (H) 06/07/2022       Latest Ref Rng & Units 06/07/2022    3:56 PM 10/24/2020    2:25 PM 11/27/2018    6:26 AM  Hepatic Function  Total Protein 6.0 - 8.5 g/dL 7.3  6.7  4.4   Albumin 3.8 - 4.8 g/dL 4.7  4.4  1.8   AST 0 - 40 IU/L 21  26  15    ALT 0 - 44 IU/L 19  26  11    Alk Phosphatase 44 - 121 IU/L 69  67  46   Total Bilirubin 0.0 - 1.2 mg/dL 0.5  0.4  0.5   Bilirubin, Direct 0.00 - 0.40 mg/dL  7.82  <9.5     Lab Results  Component Value Date/Time   TSH 7.580 (H) 06/07/2022 03:56 PM   TSH 1.998 11/24/2018 05:33 AM   TSH 1.672 11/22/2018 05:10 AM   FREET4 1.08 06/07/2022 03:56 PM       Latest Ref Rng &  Units 06/07/2022    3:56 PM 07/14/2019    1:53 PM 11/29/2018    5:58 AM  CBC  WBC 3.4 - 10.8 x10E3/uL 7.4  7.5  8.8   Hemoglobin 13.0 - 17.7 g/dL 84.1  32.4  40.1   Hematocrit 37.5 - 51.0 % 44.9  40.8  33.0   Platelets 150 - 450 x10E3/uL 247  210  269     No results found for: "VD25OH", "VITAMINB12"  Clinical ASCVD: No  The ASCVD Risk score (Arnett DK, et al., 2019) failed to calculate for the following reasons:   The 2019 ASCVD risk score is only valid for ages 39 to 72       06/07/2022    3:25 PM 10/16/2019    1:00 PM 01/26/2019    4:29 PM  Depression screen PHQ 2/9  Decreased Interest 0 1 0  Down, Depressed, Hopeless 0 1 0  PHQ - 2 Score 0 2 0  Altered sleeping 0 1 1  Tired, decreased energy 0 1 1  Change in appetite 0 2 2  Feeling bad or failure about yourself  0 0 0  Trouble  concentrating 0 1 0  Moving slowly or fidgety/restless 0 0 0  Suicidal thoughts 0 0 0  PHQ-9 Score 0 7 4  Difficult doing work/chores Not difficult at all Somewhat difficult      Social History   Tobacco Use  Smoking Status Never  Smokeless Tobacco Never   BP Readings from Last 3 Encounters:  06/07/22 (!) 155/90  04/27/22 (!) 189/108  12/27/21 130/80   Pulse Readings from Last 3 Encounters:  06/07/22 65  04/27/22 84  12/27/21 93   Wt Readings from Last 3 Encounters:  12/27/21 210 lb 4.8 oz (95.4 kg)  11/01/20 208 lb (94.3 kg)  07/04/20 211 lb (95.7 kg)   BMI Readings from Last 3 Encounters:  12/27/21 35.00 kg/m  04/17/21 34.61 kg/m  11/01/20 33.57 kg/m    Allergies  Allergen Reactions   Codeine Nausea And Vomiting   Morphine And Related Nausea And Vomiting   Novocain [Procaine] Hives    NO TROUBLE IN OR   Other Nausea And Vomiting   Percocet [Oxycodone-Acetaminophen] Nausea And Vomiting   Sulfa Antibiotics Hives   Acetaminophen    Atorvastatin    Rosuvastatin    Tramadol Nausea And Vomiting    Note: upset stomach Note: upset stomach     Medications Reviewed Today     Reviewed by Alfredia Ferguson, PA-C (Physician Assistant Certified) on 06/07/22 at 1602  Med List Status: <None>   Medication Order Taking? Sig Documenting Provider Last Dose Status Informant  acetaminophen (TYLENOL) 650 MG CR tablet [id number] Yes Take 1,300 mg by mouth every 8 (eight) hours as needed for pain. [provider] Taking Active Self  albuterol (VENTOLIN HFA) 108 (90 Base) MCG/ACT inhaler [id number] No SMARTSIG:2 Puff(s) By Mouth Every 4 Hours PRN  Patient not taking: Reported on 06/07/2022   [provider] Not Taking Active   allopurinol (ZYLOPRIM) 300 MG tablet [id number] Yes Take by mouth. [provider] Taking Active   Cholecalciferol (VITAMIN D3) 5000 UNITS TABS [id number] Yes Take 5,000 Units by mouth daily. [provider] Taking Active  Self  cyanocobalamin 1000 MCG tablet [id number] Yes Take 1,000 mcg by mouth daily. [provider] Taking Active   DULoxetine (CYMBALTA) 30 MG capsule [id number] Yes Take 30 mg by mouth 2 (two) times daily. [provider] Taking Active   finasteride (PROSCAR)  5 MG tablet [id number] No Take 1 tablet (5 mg total) by mouth daily. Carman Ching, PA-C Unknown Active Self  GEMTESA 75 MG TABS [id number] Yes Take 1 tablet by mouth daily. [provider] Taking Active   levothyroxine (SYNTHROID) 100 MCG tablet [id number] Yes Take 100 mcg by mouth daily before breakfast. [provider] Taking Active   losartan (COZAAR) 100 MG tablet [id number] Yes Take 1 tablet (100 mg total) by mouth daily. Particia Nearing, [place] Taking Active Self  REPATHA SURECLICK 140 MG/ML SOAJ [id number] No Inject 1 mL into the skin every 14 (fourteen) days.  Patient not taking: Reported on 12/27/2021   Meriam Sprague, MD Not Taking Active   tamsulosin [facility]) 0.4 MG CAPS capsule [id number] Yes Take 1 capsule (0.4 mg total) by mouth daily. Carman Ching, PA-C Taking Active Self  TOPROL XL 100 MG 24 hr tablet [id number] Yes Take 1 tablet (100 mg total) by mouth daily. Take with or immediately following a meal. Pemberton, Kathlynn Grate, MD Taking Active             SDOH:  (Social Determinants of Health) assessments and interventions performed: Yes   Medication Assistance: None required.  Patient affirms current coverage meets needs.  Medication Access: Within the past 30 days, how often has patient missed a dose of medication? None Is a pillbox or other method used to improve adherence? Yes  Factors that may affect medication adherence? transportation problems Are meds synced by current pharmacy? No  Are meds delivered by current pharmacy? No  Does patient experience delays in picking up medications due to transportation concerns? Yes   [facility] Services Reviewed: Is  patient disadvantaged to use [facility]?: No  Current Rx insurance plan: [employer] Name and location of Current pharmacy:  [facility] [address] [facility] - [address] [place]) [place] [address] Phone: [phone] Fax: [phone]  [facility] [place], [place] - [address] [place] [address] Phone: [phone] Fax: [phone]  [facility] [address], [place] - [address] [place] [address] Phone: [phone] Fax: [phone]  [facility] services reviewed with patient today?: Yes  Patient requests to transfer care to [facility]?: No  Reason patient declined to change pharmacies: Loyalty to other pharmacy/Patient preference  Compliance/Adherence/Medication fill history: Care Gaps: Annual wellness visit in last year? No Never completed Shingrix Vaccine Pneumonia Vaccine COVID-19 Vaccine HTN- 155-90- 06/07/2022  Star-Rating Drugs: Medication:Losartan 100 mg Last Fill: 05/15/2022 90 Day Supply at [employer].   Assessment/Plan   Hypertension (BP goal <140/90) -Uncontrolled -Current treatment: Losartan 100 mg daily  Metoprolol XL 100 mg daily  -Medications previously tried: NA  -Denies hypotensive/hypertensive symptoms -Recommended to continue current medication  Hyperlipidemia: (LDL goal < 70) -Uncontrolled -Current treatment: None -Medications previously tried: NA  -Patient has not been taking Repatha, he denies any financial barries with the medication, stating that the "pharmacy stopped filling" his Repatha.  -RESTART Repatha 140 mg every 14 days   Urinary Frequency / BPH (Goal: Improve symptoms) -Controlled -Current treatment  Gemtesa 75 mg daily  -Medications previously tried: NA -Patient has not been taking Tamsulosin or finasteride. Reports his current symptoms are primarily urinary urgency exacerbated due to limited  mobility. He keeps a commode by his bedside.   -Recommended to continue current medication  Chronic Pain (Goal: Maintain adequate pain relief) -Uncontrolled -Current treatment  APAP 650 mg two tablets twice  daily  Duloxetine 30 mg twice daily  -Medications previously tried: Tramadol (constipation)   -Rates pain as 8/10, improves to 0/10 if resting.  -Recommended to continue current medication  Gout (Goal: Prevent gout flares) -Not ideally controlled -Last Gout Flare: Patient unable to say -Current treatment  Allopurinol 200 mg daily  -Medications previously tried: NA   -Patient reports that his allopurinol was previously decreased from 300 mg daily to 200 mg daily, which he feels is no longer working well for him.  -We discussed:  Counseled patient on low purine diet plan. Counseled patient to reduce consumption of high-fructose corn syrup, sweetened soft drinks, fruit juices, meat, and seafood. -Increase allopurinol to 300 mg daily   Hypothyroidism (Goal: Achieve stable thyroid function) -Uncontrolled -Current treatment  Levothyroxine 112 mcg daily  -Medications previously tried: NA   -Patient has been taking levothyroxine 112 mcg for >3 months.  -INCREASE levothyroxine to 125 mcg daily   Follow Up Plan: Telephone follow up appointment with care management team member scheduled for:    Angelena Sole, PharmD, Patsy [place], CPP  Clinical Pharmacist Practitioner  [facility] [phone]

## 2022-06-12 ENCOUNTER — Telehealth: Payer: Self-pay

## 2022-06-12 ENCOUNTER — Other Ambulatory Visit: Payer: Self-pay | Admitting: Physician Assistant

## 2022-06-12 DIAGNOSIS — E039 Hypothyroidism, unspecified: Secondary | ICD-10-CM

## 2022-06-12 MED ORDER — DULOXETINE HCL 30 MG PO CPEP: 30.0000 mg | ORAL_CAPSULE | Freq: Two times a day (BID) | ORAL | 1 refills | Status: DC

## 2022-06-12 MED ORDER — GEMTESA 75 MG PO TABS: 1.0000 | ORAL_TABLET | Freq: Every day | ORAL | 1 refills | Status: DC

## 2022-06-12 MED ORDER — REPATHA SURECLICK 140 MG/ML ~~LOC~~ SOAJ
1.0000 mL | SUBCUTANEOUS | 11 refills | Status: DC
Start: 2022-06-12 — End: 2023-01-03

## 2022-06-12 MED ORDER — LEVOTHYROXINE SODIUM 125 MCG PO TABS
125.0000 ug | ORAL_TABLET | Freq: Every day | ORAL | 3 refills | Status: DC
Start: 2022-06-12 — End: 2023-10-02

## 2022-06-12 MED ORDER — ALLOPURINOL 300 MG PO TABS
300.0000 mg | ORAL_TABLET | Freq: Every day | ORAL | 1 refills | Status: DC
Start: 2022-06-12 — End: 2023-01-24

## 2022-06-12 NOTE — Addendum Note (Signed)
Addended by: Julious Payer A on: 06/12/2022 09:13 AM   Modules accepted: Orders

## 2022-06-12 NOTE — Progress Notes (Signed)
Per Clinical pharmacist, please let patient know we have sent in a new dose of his synthroid (125 mcg) a new dose of his allopurinol 300 mg daily, and a refill of his Repatha.  Patient verbalized understanding, and states he also needs a refill for his Duloxetine 30 mg and Gemtesa 75 mg to be sent to [facility] pharmacist.Notified Clinical pharmacist.  Patient is aware I will be reaching out to him with in two weeks to see if he was able to pick up everything and how he is doing.  Everlean Cherry Clinical Pharmacist Assistant [phone]

## 2022-06-19 ENCOUNTER — Ambulatory Visit: Payer: Medicare Other | Admitting: Internal Medicine

## 2022-07-02 ENCOUNTER — Telehealth: Payer: Self-pay | Admitting: Physician Assistant

## 2022-07-02 DIAGNOSIS — B351 Tinea unguium: Secondary | ICD-10-CM

## 2022-07-02 NOTE — Progress Notes (Unsigned)
Cardiology Office Note:    Date:  07/04/2022   ID:  Bobby Lane, DOB February 02, 1941, MRN [id number]  PCP:  Bobby Ferguson, PA-C  [facility] Cardiologist:  Bobby Sprague, MD  [facility] Electrophysiologist:  None   Referring MD: Bobby Grooms, NP     History of Present Illness:    Bobby Lane is a 82 y.o. male with a hx of COPD, CAD, gout, HTN, and GERD who presents to clinic for follow-up of palpitations.  Patient initially saw me on 03/25/20 for palpitations that he developed at the end of Jan 2022. Specifically, he felt a pounding in his chest. Had some lightheadedness with this. Tried to rest and the symptoms eventually improved. He went to [facility] a couple days later where medical students were checking people's heart rates and he was told his heart rate was 140s and his blood pressure was elevated. They wanted to call EMS but he declined. He presented to clinic on 03/25/20 where cardiac monitor was performed on 04/21/20 which showed 55 episodes of SVT with longest lasting 13 seconds, no Afib or sustained arrhythmias. TTE 05/19/20 hyperdynamic LV systolic function with intracavitary gradient, G1DD, no significant valve disease.  Was last seen in 12/2021 where he continued to struggle with arthritis and neuropathy, Was stable from a CV standpoint.  Today, the patient states that he continues to struggle with arthritis and neuropathy. ABI normal in 04/2021. Otherwise, doing okay from a CV perspective. Has been having episodes of palpitations which he attributes to being out of his metoprolol. No chest pain, SOB, orthopnea or PND.   Past Medical History:  Diagnosis Date   Allergy    Arthritis    Cancer (HCC)    SKIN   COPD (chronic obstructive pulmonary disease) (HCC)    Coronary artery disease    Depression    Dyspnea    DOE   GERD (gastroesophageal reflux disease)    Gout    Heart murmur    History of hiatal hernia    History of kidney stones    History of  wheezing    HOH (hard of hearing)    Hypertension    Hypothyroidism    Oxygen deficit    PRN USE; used ~ 2014 for less than a year (no longer has as of 07/15/19)   Pneumonia    Renal disorder    Thyroid disease     Past Surgical History:  Procedure Laterality Date   ANTERIOR CERVICAL DECOMP/DISCECTOMY FUSION N/A 07/16/2019   Procedure: Cervical three-four Cervical five-six Cervical six-seven Anterior cervical decompression/discectomy/fusion;  Surgeon: Maeola Harman, MD;  Location: [facility] OR;  Service: Neurosurgery;  Laterality: N/A;  3C   CARDIAC CATHETERIZATION     CARPAL TUNNEL RELEASE Left 07/16/2019   Procedure: LEFT CARPAL TUNNEL RELEASE;  Surgeon: Maeola Harman, MD;  Location: [facility] OR;  Service: Neurosurgery;  Laterality: Left;   CATARACT EXTRACTION W/PHACO Right 08/21/2016   Procedure: CATARACT EXTRACTION PHACO AND INTRAOCULAR LENS PLACEMENT (IOC);  Surgeon: Galen Manila, MD;  Location: [facility] ORS;  Service: Ophthalmology;  Laterality: Right;  [place] 00:54.9 AP% 18.3 CDE 10.04 Fluid pack lot # [id number]   CATARACT EXTRACTION W/PHACO Left 09/18/2016   Procedure: CATARACT EXTRACTION PHACO AND INTRAOCULAR LENS PLACEMENT (IOC);  Surgeon: Galen Manila, MD;  Location: [facility] ORS;  Service: Ophthalmology;  Laterality: Left;  [place] 00:56 AP% 19.7 CDE 11.07 Fluid pack lot # [id number]   FRACTURE SURGERY     ANKLE   HERNIA REPAIR  UPPER GI ENDOSCOPY      Current Medications: Current Meds  Medication Sig   acetaminophen (TYLENOL) 650 MG CR tablet Take 1,300 mg by mouth every 8 (eight) hours as needed for pain.   albuterol (VENTOLIN HFA) 108 (90 Base) MCG/ACT inhaler    allopurinol (ZYLOPRIM) 300 MG tablet Take 1 tablet (300 mg total) by mouth daily.   Cholecalciferol (VITAMIN D3) 5000 UNITS TABS Take 5,000 Units by mouth daily.   cyanocobalamin 1000 MCG tablet Take 1,000 mcg by mouth daily.   DULoxetine (CYMBALTA) 30 MG capsule Take 1 capsule (30 mg total) by mouth 2 (two) times daily.    GEMTESA 75 MG TABS Take 1 tablet (75 mg total) by mouth daily.   levothyroxine (SYNTHROID) 125 MCG tablet Take 1 tablet (125 mcg total) by mouth daily.   losartan (COZAAR) 100 MG tablet Take 1 tablet (100 mg total) by mouth daily.   REPATHA SURECLICK 140 MG/ML SOAJ Inject 140 mg into the skin every 14 (fourteen) days.   [DISCONTINUED] TOPROL XL 100 MG 24 hr tablet Take 1 tablet (100 mg total) by mouth daily. Take with or immediately following a meal.     Allergies:   Codeine, Morphine and codeine, Novocain [procaine], Other, Percocet [oxycodone-acetaminophen], Sulfa antibiotics, Acetaminophen, Atorvastatin, Rosuvastatin, and Tramadol   Social History   Socioeconomic History   Marital status: Single    Spouse name: Not on file   Number of children: Not on file   Years of education: Not on file   Highest education level: Not on file  Occupational History   Not on file  Tobacco Use   Smoking status: Never   Smokeless tobacco: Never  Vaping Use   Vaping Use: Never used  Substance and Sexual Activity   Alcohol use: No   Drug use: No   Sexual activity: Not Currently  Other Topics Concern   Not on file  Social History Narrative   Not on file   Social Determinants of Health   Financial Resource Strain: Low Risk  (06/12/2022)   Overall Financial Resource Strain (CARDIA)    Difficulty of Paying Living Expenses: Not hard at all  Food Insecurity: Not on file  Transportation Needs: No Transportation Needs (06/12/2022)   PRAPARE - Transportation    Lack of Transportation (Medical): No    Lack of Transportation (Non-Medical): No  Physical Activity: Not on file  Stress: Not on file  Social Connections: Not on file     Family History: The patient's family history includes Brain cancer in his father; Cancer in his paternal uncle. There is no history of Prostate cancer, Bladder Cancer, or Kidney cancer.  ROS:   Please see the history of present illness.    AS per  HPI  EKGs/Labs/Other Studies Reviewed:    The following studies were reviewed today:  ABI Studies 04/17/2021: Summary: Right: Resting right ankle-brachial index is within normal range. No evidence of significant right lower extremity arterial disease. The right toe-brachial index is normal. Final RENAULT NILE [id number] 04/17/2021 Left: Resting left ankle-brachial index is within normal range. No evidence of significant left lower extremity arterial disease. The left toe-brachial index is normal.  TTE 05/19/20: IMPRESSIONS   1. Intracavitary gradient at rest. Peak velocity 1.87 m/s. Peak gradient  14 mmHg. Peak gradient increases to 36 mmHg and peak velocity increases to  3 m/s with Valsalva. Left ventricular ejection fraction, by estimation, is  70 to 75%. The left ventricle  has hyperdynamic function. The left  ventricle has no regional wall motion  abnormalities. Left ventricular diastolic parameters are consistent with  Grade I diastolic dysfunction (impaired relaxation).   2. Right ventricular systolic function is normal. The right ventricular  size is normal.   3. The mitral valve is normal in structure. No evidence of mitral valve  regurgitation. No evidence of mitral stenosis.   4. The aortic valve is normal in structure. Aortic valve regurgitation is  not visualized. No aortic stenosis is present.   5. The inferior vena cava is normal in size with greater than 50%  respiratory variability, suggesting right atrial pressure of 3 mmHg.   Comparison(s): 06/23/13 EF 60-65%.   Cardiac Monitor 04/21/20: Patch wear time 13 days and 17 hours Predominant rhythm is NSR with average HR 70; ranging from 50-187bpm There were 55 episodes of SVT with longest lasting 13.2 seconds at rate 187bpm (also the fastest episode); did not correlate with patient triggered event. Isolated SVEs and PVCs were rate Ventricular bigeminy and trigeminy were present No Afib, significant pauses or VT Patch  Wear Time:  13 days and 17 hours (2022-02-17T15:00:04-498 to 2022-03-03T08:36:54-0500)   Patient had a min HR of 50 bpm, max HR of 187 bpm, and avg HR of 70 bpm. Predominant underlying rhythm was Sinus Rhythm. 55 Supraventricular Tachycardia runs occurred, the run with the fastest interval lasting 13.2 secs with a max rate of 187 bpm (avg  149 bpm); the run with the fastest interval was also the longest. Idioventricular Rhythm was present. Ectopic Atrial Rhythm was present. Isolated SVEs were rare (<1.0%), SVE Couplets were rare (<1.0%), and SVE Triplets were rare (<1.0%). Isolated VEs  were rare (<1.0%, 5778), VE Couplets were rare (<1.0%, 36), and VE Triplets were rare (<1.0%, 1). Ventricular Bigeminy and Trigeminy were present.   Renal Artery [place] 06/2013: Technically challenging study. Normal caliber abdominal aorta. Normal and symmetrical kidney size. Normal renal arteries, bilaterally. Incidental finding of right kidney, lower pole cyst, measures 2.8 cm x 2.8 cm x 2.7 cm. The IVC and renal veins are patent.  TTE 06/23/13: ------------------------------------------------------------  Left ventricle:  The cavity size was normal. Wall thickness  was increased in a pattern of mild LVH. There was focal  basal hypertrophy. Systolic function was normal. The  estimated ejection fraction was in the range of 60% to 65%.  Wall motion was normal; there were no regional wall motion  abnormalities. Early diastolic septal annular tissue Doppler  velocities Ea were abnormal.   ------------------------------------------------------------  Aortic valve:   Mildly to moderately calcified annulus.  Doppler:   There was no stenosis.    No regurgitation.   ------------------------------------------------------------  Aorta:  Aortic root: The aortic root was normal in size.  Ascending aorta: The ascending aorta was normal in size.   ------------------------------------------------------------  Mitral  valve:   Structurally normal valve.   Leaflet  separation was normal.  Doppler:  Transvalvular velocity was  within the normal range. There was no evidence for stenosis.   No regurgitation.    Peak gradient: 2mm Hg (D).   ------------------------------------------------------------  Left atrium:  The atrium was normal in size.   ------------------------------------------------------------  Right ventricle:  The cavity size was normal. Systolic  function was normal.   ------------------------------------------------------------  Pulmonic valve:    Structurally normal valve.   Cusp  separation was normal.  Doppler:  Transvalvular velocity was  within the normal range.  No regurgitation.   ------------------------------------------------------------  Tricuspid valve:   Structurally normal valve.    Doppler:  Mild regurgitation.   ------------------------------------------------------------  Right atrium:  The atrium was normal in size.   ------------------------------------------------------------  Pericardium:  There was no pericardial effusion.   ------------------------------------------------------------  Systemic veins:  Inferior vena cava: The vessel was normal in size; the  respirophasic diameter changes were in the normal range (=  50%); findings are consistent with normal central venous  pressure.    Coronary CTA: FINDINGS: Cardiovascular: Limited study primarily due to bolus dispersion and intermittent respiratory motion. No evidence of central or lobar pulmonary embolism. No acute aortic finding. There is multifocal atherosclerotic calcification of the aorta and coronaries. Normal heart size. No pericardial effusion   Mediastinum/Nodes: Negative for adenopathy or mass   Lungs/Pleura: There is no edema, consolidation, effusion, or pneumothorax. Mild dependent atelectasis.   Upper Abdomen: Negative   Musculoskeletal: Spondylosis and generalized degenerative  disease. 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 Osteopenia   Review of the MIP images confirms the above findings.   IMPRESSION: 1. Very limited pulmonary artery evaluation. No evidence of main or lobar pulmonary embolism. 2. Aortic and coronary atherosclerosis.  EKG:  No new tracing  Recent Labs: 06/07/2022: ALT 19; BUN 27; Creatinine, Ser 1.54; Hemoglobin 14.9; Platelets 247; Potassium 5.6; Sodium 138; TSH 7.580  Recent Lipid Panel    Component Value Date/Time   CHOL 213 (H) 06/07/2022 1556   TRIG 273 (H) 06/07/2022 1556   HDL 26 (L) 06/07/2022 1556   CHOLHDL 8.2 (H) 06/07/2022 1556   LDLCALC 137 (H) 06/07/2022 1556     Physical Exam:    VS:  BP 124/62   Pulse (!) 115   Ht 5\' 7"  (1.702 m)   Wt 210 lb (95.3 kg)   SpO2 94%   BMI 32.89 kg/m     Wt Readings from Last 3 Encounters:  07/04/22 210 lb (95.3 kg)  12/27/21 210 lb 4.8 oz (95.4 kg)  11/01/20 208 lb (94.3 kg)     GEN:  Comfortable elderly male, sitting in a wheelchair, NAD HEENT: Normal NECK: No JVD; No carotid bruits CARDIAC: RRR,2/6 systolic murmur RESPIRATORY:  Clear to auscultation without rales, wheezing or rhonchi  ABDOMEN: Soft, non-tender, non-distended MUSCULOSKELETAL:  Trace edema, warm  SKIN: Warm and dry NEUROLOGIC:  Alert and oriented x 3 PSYCHIATRIC:  Normal affect   ASSESSMENT:    1. Paroxysmal SVT (supraventricular tachycardia)   2. Coronary artery disease involving native coronary artery of native heart without angina pectoris   3. Essential hypertension   4. Palpitations   5. Mixed hyperlipidemia   6. Chronic obstructive pulmonary disease, unspecified COPD type (HCC)      PLAN:    In order of problems listed above:  #Palpitations: #SVT: Patient with episode of heart pounding/jumping out of his chest in late Jan that lasted for about 1 day. Had some associated lightheadedness. Went to [employer] [facility] a couple days later and was told his HR was elevated as well as his BP.  Obatined cardiac monitor that showed 55 runs of nonsustained SVT but no afib or sustained arrhythmias. TTE with hyperdynamic function and G1DD, no valve disease. Palpitations significantly improved on metoprolol. -Continue metoprolol 100mg  XL daily (will refill today)  #CAD with coronary calcification on CT chest: No current anginal symptoms.  -Continue repatha 140mg  q14d -Continue home metoprolol 100mg  XL daily  #HTN: Well controlled today. -Continue home metoprolol 100mg  XL daily and losartan 100mg  daily  #HLD: -Continue repatha 140mg  q14d (will refill today) -Repeat lipids once back on repatha  #COPD: -Management per PCP  #  Peripheral Neuropathy: #Joint Pain: Followed by PCP.. -Continue management per PCP   Follow-up: 6months  Medication Adjustments/Labs and Tests Ordered: Current medicines are reviewed at length with the patient today.  Concerns regarding medicines are outlined above.  No orders of the defined types were placed in this encounter.  Meds ordered this encounter  Medications   TOPROL XL 100 MG 24 hr tablet    Sig: Take 1 tablet (100 mg total) by mouth daily. Take with or immediately following a meal.    Dispense:  90 tablet    Refill:  3    Pt wants name brand only. Fill brand name.    Patient Instructions  Medication Instructions:   Your physician recommends that you continue on your current medications as directed. Please refer to the Current Medication list given to you today.  *If you need a refill on your cardiac medications before your next appointment, please call your pharmacy*    Follow-Up: At [facility], you and your health needs are our priority.  As part of our continuing mission to provide you with exceptional heart care, we have created designated Provider Care Teams.  These Care Teams include your primary Cardiologist (physician) and Advanced Practice Providers (APPs -  Physician Assistants and Nurse Practitioners) who all  work together to provide you with the care you need, when you need it.  We recommend signing up for the patient portal called "MyChart".  Sign up information is provided on this After Visit Summary.  MyChart is used to connect with patients for Virtual Visits (Telemedicine).  Patients are able to view lab/test results, encounter notes, upcoming appointments, etc.  Non-urgent messages can be sent to your provider as well.   To learn more about what you can do with MyChart, go to [email or web address].    Your next appointment:   6 month(s)  Provider:   Jari Favre, PA-C, Ronie Spies, PA-C, Robin Searing, NP, Jacolyn Reedy, PA-C, Eligha Bridegroom, NP, or Tereso Newcomer, PA-C               Signed, Bobby Sprague, MD  07/04/2022 4:58 PM    [facility]

## 2022-07-02 NOTE — Telephone Encounter (Signed)
I dont think I am going to be able to get this approved. I dont have all the information I need to. I recommend he has his cardiologist refill it

## 2022-07-02 NOTE — Telephone Encounter (Signed)
Started repatha PA  [id number]

## 2022-07-02 NOTE — Telephone Encounter (Signed)
LVMTCB, ok for [facility] nurse to advise

## 2022-07-03 NOTE — Telephone Encounter (Signed)
Patient notified - he has cardiology appointment tomorrow and will speak with them about the medication.  Patient is inquiring about podiatry more local- he has one in [place]- but that is too far. Patient states his son mentioned there may be one at [place] [facility]. Told patient I would inquire for him.

## 2022-07-04 ENCOUNTER — Encounter: Payer: Self-pay | Admitting: Cardiology

## 2022-07-04 ENCOUNTER — Ambulatory Visit: Payer: Medicare HMO | Admitting: Podiatry

## 2022-07-04 ENCOUNTER — Ambulatory Visit: Payer: Medicare HMO | Attending: Cardiology | Admitting: Cardiology

## 2022-07-04 VITALS — BP 124/62 | HR 115 | Ht 67.0 in | Wt 210.0 lb

## 2022-07-04 DIAGNOSIS — I10 Essential (primary) hypertension: Secondary | ICD-10-CM | POA: Diagnosis not present

## 2022-07-04 DIAGNOSIS — J449 Chronic obstructive pulmonary disease, unspecified: Secondary | ICD-10-CM | POA: Diagnosis not present

## 2022-07-04 DIAGNOSIS — R002 Palpitations: Secondary | ICD-10-CM

## 2022-07-04 DIAGNOSIS — E782 Mixed hyperlipidemia: Secondary | ICD-10-CM | POA: Diagnosis not present

## 2022-07-04 DIAGNOSIS — I2510 Atherosclerotic heart disease of native coronary artery without angina pectoris: Secondary | ICD-10-CM

## 2022-07-04 DIAGNOSIS — I471 Supraventricular tachycardia, unspecified: Secondary | ICD-10-CM | POA: Diagnosis not present

## 2022-07-04 MED ORDER — TOPROL XL 100 MG PO TB24
100.0000 mg | ORAL_TABLET | Freq: Every day | ORAL | 3 refills | Status: DC
Start: 2022-07-04 — End: 2022-07-11

## 2022-07-04 NOTE — Patient Instructions (Signed)
Medication Instructions:   Your physician recommends that you continue on your current medications as directed. Please refer to the Current Medication list given to you today.  *If you need a refill on your cardiac medications before your next appointment, please call your pharmacy*   Follow-Up: At [facility], you and your health needs are our priority.  As part of our continuing mission to provide you with exceptional heart care, we have created designated Provider Care Teams.  These Care Teams include your primary Cardiologist (physician) and Advanced Practice Providers (APPs -  Physician Assistants and Nurse Practitioners) who all work together to provide you with the care you need, when you need it.  We recommend signing up for the patient portal called "MyChart".  Sign up information is provided on this After Visit Summary.  MyChart is used to connect with patients for Virtual Visits (Telemedicine).  Patients are able to view lab/test results, encounter notes, upcoming appointments, etc.  Non-urgent messages can be sent to your provider as well.   To learn more about what you can do with MyChart, go to [email or web address].    Your next appointment:   6 month(s)  Provider:   Tessa Conte, PA-C, Dayna Dunn, PA-C, Ernest Dick, NP, Michele Lenze, PA-C, Michelle Swinyer, NP, or Scott Weaver, PA-C           

## 2022-07-09 ENCOUNTER — Other Ambulatory Visit (HOSPITAL_COMMUNITY): Payer: Self-pay

## 2022-07-09 ENCOUNTER — Telehealth: Payer: Self-pay

## 2022-07-09 NOTE — Telephone Encounter (Signed)
Pharmacy Patient Advocate Encounter   Received notification from [facility] that prior authorization for TOPROL is needed.    PA submitted on 07/09/22 Key [id number] Status is pending  Haze Rushing, CPhT Pharmacy Patient Advocate Specialist Direct Number: [phone] Fax: [phone]

## 2022-07-10 ENCOUNTER — Ambulatory Visit (INDEPENDENT_AMBULATORY_CARE_PROVIDER_SITE_OTHER): Payer: Medicare HMO | Admitting: Podiatry

## 2022-07-10 ENCOUNTER — Encounter: Payer: Self-pay | Admitting: Podiatry

## 2022-07-10 DIAGNOSIS — B351 Tinea unguium: Secondary | ICD-10-CM

## 2022-07-10 DIAGNOSIS — M79674 Pain in right toe(s): Secondary | ICD-10-CM | POA: Diagnosis not present

## 2022-07-10 DIAGNOSIS — M79675 Pain in left toe(s): Secondary | ICD-10-CM | POA: Diagnosis not present

## 2022-07-10 NOTE — Telephone Encounter (Signed)
Please see PA denial. Is there a clinical reason patient needs to be on brand name?

## 2022-07-10 NOTE — Progress Notes (Signed)
Subjective:  Patient ID: Bobby Lane, male    DOB: Jun 29, 1940,  MRN: [id number]   DAERON AARDEMA presents to clinic today for:  Chief Complaint  Patient presents with   Nail Problem    Patient states toenails are long, thick, painful and difficult to trim. Patient does not have his hearing aids in so you will need to speak loudly.  . Patient notes nails are thick, discolored, elongated and painful in shoegear when trying to ambulate.  He notes the great toenails need "dug out ".  PCP is Alfredia Ferguson, PA-C.  Allergies  Allergen Reactions   Codeine Nausea And Vomiting   Morphine And Codeine Nausea And Vomiting   Novocain [Procaine] Hives    NO TROUBLE IN OR   Other Nausea And Vomiting   Percocet [Oxycodone-Acetaminophen] Nausea And Vomiting   Sulfa Antibiotics Hives   Acetaminophen    Atorvastatin    Rosuvastatin    Tramadol Nausea And Vomiting    Note: upset stomach Note: upset stomach     Review of Systems: Negative except as noted in the HPI.  Objective:  There were no vitals filed for this visit.  Bobby Lane is a pleasant 82 y.o. male in NAD. AAO x 3.  His hearing loss is quite evident today due to his hearing aids no longer working.  Vascular Examination: Capillary refill time is 3-5 seconds to toes bilateral. Palpable pedal pulses b/l LE. Digital hair present b/l.  +1 pitting edema bilateral lower extremity.  Skin temperature gradient WNL b/l. No varicosities b/l. No cyanosis or clubbing noted b/l.   Dermatological Examination: Pedal skin with normal turgor, texture and tone b/l. No open wounds. No interdigital macerations b/l. Toenails x10 are 3-35mm thick, discolored, dystrophic with subungual debris. There is pain with compression of the nail plates.  They are elongated x10.  No onychocryptosis or paronychia noted  Neurological Examination: Protective sensation intact bilateral LE. Vibratory sensation intact bilateral LE.     Latest Ref Rng & Units  06/07/2022    3:56 PM  Hemoglobin A1C  Hemoglobin-A1c 4.8 - 5.6 % 5.3    Assessment/Plan: 1. Pain due to onychomycosis of toenails of both feet     The mycotic toenails were sharply debrided x10 with sterile nail nippers and a power debriding burr to decrease bulk/thickness and length.  After cutting back the hallux corners to alleviate any distal discomfort, the patient requested they be cut back further.  Informed the patient that there is a procedure called a permanent nail avulsion which we can anesthetize the toe and permanently remove the sides of the toenail that are growing down into the skin and or causing any discomfort.  There is no need to cause him pain or bleeding by digging out the toenails at his request.  He did not seem interested in pursuing this today.  At the end of the appointment he stated that he wants lotion rubbed into his legs and feet.  Informed the patient that is not part of this service/appointment.  He states that there was a physician in the past that did apply this lotion to his legs and feet.  Discussed his concerns with the medical assistant today.  She was going to see if she could find some lotion available to apply.  Patient will need to request that specific provider in the future if he wants this performed at each visit.  Return in about 3 months (around 10/10/2022) for  RFC.   Rossanna Spitzley DBurna Mortimer, DPM, FACFAS [facility]     [address] [place], [place] [address]                Office [phone]  Fax [phone]

## 2022-07-10 NOTE — Telephone Encounter (Signed)
Pt has to take brand name metoprolol due to generic causing him upset stomach, jittery and fatigued.   He has been on brand name for well over 3 years.   Can you please assist with this?  Thanks!

## 2022-07-10 NOTE — Telephone Encounter (Signed)
Pharmacy Patient Advocate Encounter  Received notification from [facility] that the request for prior authorization for TOPROL has been denied due to NEED MORE DETAILS AS TO WHY PATIENT CANNOT TAKE PREFERRED DRUGPLEASE ADVISE    Haze Rushing, CPhT Pharmacy Patient Advocate Specialist Direct Number: [phone] Fax: [phone]

## 2022-07-11 ENCOUNTER — Telehealth: Payer: Self-pay | Admitting: Physician Assistant

## 2022-07-11 MED ORDER — CARVEDILOL 12.5 MG PO TABS: 12.5000 mg | ORAL_TABLET | Freq: Two times a day (BID) | ORAL | 3 refills | Status: DC

## 2022-07-11 NOTE — Telephone Encounter (Signed)
Per Dr. Shari Prows being his insurance will no longer cover his Toprol XL as DAW, we will switch him to coreg 12.5 mg po bid.  Called the pt and informed him of this.  He is aware that we will switch him to carvedilol 12.5 mg po bid.  Confirmed the pharmacy of choice with the pt.   Pt verbalized understanding and agrees with this plan.

## 2022-07-11 NOTE — Telephone Encounter (Signed)
Covermymeds is requesting prior authorization Key: [id number] Name: Bobby Lane [employer]

## 2022-07-11 NOTE — Telephone Encounter (Signed)
Generic was listed as an intolerance for patient when sent to plan.     Please see full denial on chart media.   This is not the same insurance that approved this 3 years ago. They have different rules for approval and other preferred alternatives we have to explain clinically why the patient cannot take the other preferred alternatives also, not just the metoprolol. Plan prefers either ATENOLOL, CARVEDILOL, OR BISOPROLOL, which are all covered without prior authorization.    PLEASE ADVISE

## 2022-07-11 NOTE — Addendum Note (Signed)
Addended by: Loa Socks on: 07/11/2022 10:10 AM   Modules accepted: Orders

## 2022-07-11 NOTE — Telephone Encounter (Signed)
The insurance is saying that you need to give a clinical reason he can't take all the alternatives including the ones he has not tired- atenolol, carvedilol and bisoprolol. I don't see any clinical reason he can't take. I don't think you will win the appeals. Patient can pay cash for name brand or he can be changed to another alternative.

## 2022-07-12 NOTE — Telephone Encounter (Signed)
Cardio is doing it. Ok to disregard

## 2022-07-12 NOTE — Telephone Encounter (Signed)
Information regarding your request There is an existing case within the [facility] environment that has the same patient, prescriber, and drug. This case must be finalized before proceeding with similar requests.

## 2022-07-23 ENCOUNTER — Ambulatory Visit (INDEPENDENT_AMBULATORY_CARE_PROVIDER_SITE_OTHER): Payer: Medicare HMO

## 2022-07-23 VITALS — Ht 67.0 in | Wt 210.0 lb

## 2022-07-23 DIAGNOSIS — Z0000 Encounter for general adult medical examination without abnormal findings: Secondary | ICD-10-CM | POA: Diagnosis not present

## 2022-07-23 NOTE — Progress Notes (Addendum)
I connected with  Bobby Lane on 07/23/22 by a audio enabled telemedicine application and verified that I am speaking with the correct person using two identifiers.  Patient Location: Home  Provider Location: Home Office  I discussed the limitations of evaluation and management by telemedicine. The patient expressed understanding and agreed to proceed.  Subjective:   Bobby Lane is a 82 y.o. male who presents for Medicare Initial/Annual preventive examination.  Review of Systems    Cardiac Risk Factors include: dyslipidemia;hypertension;advanced age (>75men, >21 women);male gender;sedentary lifestyle;obesity (BMI >30kg/m2)    Objective:    Today's Vitals   07/23/22 1405 07/23/22 1406  Weight: 210 lb (95.3 kg)   Height: 5\' 7"  (1.702 m)   PainSc:  10-Worst pain ever   Body mass index is 32.89 kg/m.     07/23/2022    2:25 PM 04/27/2022    3:01 PM 07/17/2019    5:00 AM 07/16/2019    6:23 AM 07/14/2019    1:43 PM 11/13/2018   10:59 PM 04/10/2018    1:56 PM  Advanced Directives  Does Patient Have a Medical Advance Directive? No No No No No No No  Would patient like information on creating a medical advance directive?   No - Patient declined No - Patient declined No - Patient declined No - Patient declined No - Patient declined    Current Medications (verified) Outpatient Encounter Medications as of 07/23/2022  Medication Sig   acetaminophen (TYLENOL) 650 MG CR tablet Take 1,300 mg by mouth every 8 (eight) hours as needed for pain.   albuterol (VENTOLIN HFA) 108 (90 Base) MCG/ACT inhaler    allopurinol (ZYLOPRIM) 300 MG tablet Take 1 tablet (300 mg total) by mouth daily.   carvedilol (COREG) 12.5 MG tablet Take 1 tablet (12.5 mg total) by mouth 2 (two) times daily.   Cholecalciferol (VITAMIN D3) 5000 UNITS TABS Take 5,000 Units by mouth daily.   cyanocobalamin 1000 MCG tablet Take 1,000 mcg by mouth daily.   DULoxetine (CYMBALTA) 30 MG capsule Take 1 capsule (30 mg total) by mouth  2 (two) times daily.   GEMTESA 75 MG TABS Take 1 tablet (75 mg total) by mouth daily.   levothyroxine (SYNTHROID) 125 MCG tablet Take 1 tablet (125 mcg total) by mouth daily.   losartan (COZAAR) 100 MG tablet Take 1 tablet (100 mg total) by mouth daily.   REPATHA SURECLICK 140 MG/ML SOAJ Inject 140 mg into the skin every 14 (fourteen) days.   No facility-administered encounter medications on file as of 07/23/2022.    Allergies (verified) Codeine, Morphine and codeine, Novocain [procaine], Other, Percocet [oxycodone-acetaminophen], Sulfa antibiotics, Acetaminophen, Atorvastatin, Rosuvastatin, and Tramadol   History: Past Medical History:  Diagnosis Date   Allergy    Arthritis    Cancer (HCC)    SKIN   COPD (chronic obstructive pulmonary disease) (HCC)    Coronary artery disease    Depression    Dyspnea    DOE   GERD (gastroesophageal reflux disease)    Gout    Heart murmur    History of hiatal hernia    History of kidney stones    History of wheezing    HOH (hard of hearing)    Hypertension    Hypothyroidism    Oxygen deficit    PRN USE; used ~ 2014 for less than a year (no longer has as of 07/15/19)   Pneumonia    Renal disorder    Thyroid disease    Past  Surgical History:  Procedure Laterality Date   ANTERIOR CERVICAL DECOMP/DISCECTOMY FUSION N/A 07/16/2019   Procedure: Cervical three-four Cervical five-six Cervical six-seven Anterior cervical decompression/discectomy/fusion;  Surgeon: Maeola Harman, MD;  Location[facility];  Service: Neurosurgery;  Laterality: N/A;  3C   CARDIAC CATHETERIZATION     CARPAL TUNNEL RELEASE Left 07/16/2019   Procedure: LEFT CARPAL TUNNEL RELEASE;  Surgeon: Maeola Harman, MD;  Location: [facility] OR;  Service: Neurosurgery;  Laterality: Left;   CATARACT EXTRACTION W/PHACO Right 08/21/2016   Procedure: CATARACT EXTRACTION PHACO AND INTRAOCULAR LENS PLACEMENT (IOC);  Surgeon: Galen Manila, MD;  Location: [facility] ORS;  Service: Ophthalmology;  Laterality:  Right;  [place] 00:54.9 AP% 18.3 CDE 10.04 Fluid pack lot # [id number]   CATARACT EXTRACTION W/PHACO Left 09/18/2016   Procedure: CATARACT EXTRACTION PHACO AND INTRAOCULAR LENS PLACEMENT (IOC);  Surgeon: Galen Manila, MD;  Location: [facility] ORS;  Service: Ophthalmology;  Laterality: Left;  [place] 00:56 AP% 19.7 CDE 11.07 Fluid pack lot # [id number]   FRACTURE SURGERY     ANKLE   HERNIA REPAIR     UPPER GI ENDOSCOPY     Family History  Problem Relation Age of Onset   Brain cancer Father    Cancer Paternal Uncle    Prostate cancer Neg Hx    Bladder Cancer Neg Hx    Kidney cancer Neg Hx    Social History   Socioeconomic History   Marital status: Single    Spouse name: Not on file   Number of children: Not on file   Years of education: Not on file   Highest education level: Not on file  Occupational History   Not on file  Tobacco Use   Smoking status: Never   Smokeless tobacco: Never  Vaping Use   Vaping Use: Never used  Substance and Sexual Activity   Alcohol use: No   Drug use: No   Sexual activity: Not Currently  Other Topics Concern   Not on file  Social History Narrative   Not on file   Social Determinants of Health   Financial Resource Strain: Low Risk  (07/23/2022)   Overall Financial Resource Strain (CARDIA)    Difficulty of Paying Living Expenses: Not hard at all  Food Insecurity: No Food Insecurity (07/23/2022)   Hunger Vital Sign    Worried About Running Out of Food in the Last Year: Never true    Ran Out of Food in the Last Year: Never true  Transportation Needs: No Transportation Needs (07/23/2022)   PRAPARE - Administrator, Civil Service (Medical): No    Lack of Transportation (Non-Medical): No  Physical Activity: Inactive (07/23/2022)   Exercise Vital Sign    Days of Exercise per Week: 0 days    Minutes of Exercise per Session: 0 min  Stress: No Stress Concern Present (07/23/2022)   [employer] of Occupational Health - Occupational Stress  Questionnaire    Feeling of Stress : Not at all  Social Connections: Socially Isolated (07/23/2022)   Social Connection and Isolation Panel [NHANES]    Frequency of Communication with Friends and Family: Never    Frequency of Social Gatherings with Friends and Family: Never    Attends Religious Services: Never    Database administrator or Organizations: No    Attends Engineer, structural: Never    Marital Status: Divorced    Tobacco Counseling Counseling given: Not Answered  Clinical Intake:  Pre-visit preparation completed: No  Pain : 0-10 Pain  Score: 10-Worst pain ever Pain Type: Chronic pain Pain Location: Knee (both knees) Pain Descriptors / Indicators: Aching Pain Onset: More than a month ago Pain Frequency: Constant Pain Relieving Factors: Tylenol, staying off of them,joint creme  Pain Relieving Factors: Tylenol, staying off of them,joint creme  BMI - recorded: 32.89 Nutritional Status: BMI > 30  Obese Nutritional Risks: None Diabetes: No  How often do you need to have someone help you when you read instructions, pamphlets, or other written materials from your doctor or pharmacy?: 1 - Never  Diabetic?no  Interpreter Needed?: No  Comments: lives alone Information entered by :: B.Halton Neas,LPN   Activities of Daily Living    07/23/2022    2:25 PM 06/07/2022    3:25 PM  In your present state of health, do you have any difficulty performing the following activities:  Hearing? 1 1  Vision? 0   Difficulty concentrating or making decisions? 1 1  Walking or climbing stairs? 1 1  Dressing or bathing? 0 0  Doing errands, shopping? 0 0  Preparing Food and eating ? N   Using the Toilet? N   In the past six months, have you accidently leaked urine? Y   Do you have problems with loss of bowel control? N   Managing your Medications? N   Managing your Finances? N   Housekeeping or managing your Housekeeping? N     Patient Care Team: Alfredia Ferguson, PA-C  as PCP - General (Physician Assistant) Meriam Sprague, MD as PCP - Cardiology (Cardiology) Gaspar Cola, [facility] (Pharmacist)  Indicate any recent Medical Services you may have received from other than [facility] providers in the past year (date may be approximate).     Assessment:   This is a routine wellness examination for Bobby Lane.  Hearing/Vision screen Hearing Screening - Comments:: Adequate hearing w/two hearing aides Vision Screening - Comments:: Adequate vision w/glasses:double vision in rt eye Dr Moreen Fowler  Dietary issues and exercise activities discussed: Current Exercise Habits: The patient does not participate in regular exercise at present, Exercise limited by: orthopedic condition(s);neurologic condition(s);cardiac condition(s)   Goals Addressed   None    Depression Screen    07/23/2022    2:17 PM 06/07/2022    3:25 PM 10/16/2019    1:00 PM 01/26/2019    4:29 PM  PHQ 2/9 Scores  PHQ - 2 Score 0 0 2 0  PHQ- 9 Score  0 7 4    Fall Risk    07/23/2022    2:13 PM 06/07/2022    3:25 PM 11/13/2019    1:25 PM 10/16/2019    1:00 PM 01/26/2019    4:29 PM  Fall Risk   Falls in the past year? 1 1 0 1 1  Number falls in past yr: 1 1  1 1   Injury with Fall? 0 1  0 1  Risk for fall due to : No Fall Risks History of fall(s)     Follow up Education provided;Falls prevention discussed Falls evaluation completed       FALL RISK PREVENTION PERTAINING TO THE HOME:  Any stairs in or around the home? No  If so, are there any without handrails? No  Home free of loose throw rugs in walkways, pet beds, electrical cords, etc? Yes  Adequate lighting in your home to reduce risk of falls? Yes   ASSISTIVE DEVICES UTILIZED TO PREVENT FALLS:  Life alert? No  Use of a cane, walker or w/c? Yes  Grab bars in  the bathroom? Yes  Shower chair or bench in shower? Yes  Elevated toilet seat or a handicapped toilet? Yes    Cognitive Function:        07/23/2022    2:29 PM  6CIT  Screen  What Year? 0 points  What month? 0 points  What time? 0 points  Count back from 20 2 points  Months in reverse 0 points  Repeat phrase 2 points  Total Score 4 points    Immunizations Immunization History  Administered Date(s) Administered   Moderna Sars-Covid-2 Vaccination 06/06/2019, 07/04/2019, 02/16/2020   Tdap 11/12/2017    TDAP status: Up to date  Flu Vaccine status: Declined, Education has been provided regarding the importance of this vaccine but patient still declined. Advised may receive this vaccine at local pharmacy or Health Dept. Aware to provide a copy of the vaccination record if obtained from local pharmacy or Health Dept. Verbalized acceptance and understanding.  Pneumococcal vaccine status: Declined,  Education has been provided regarding the importance of this vaccine but patient still declined. Advised may receive this vaccine at local pharmacy or Health Dept. Aware to provide a copy of the vaccination record if obtained from local pharmacy or Health Dept. Verbalized acceptance and understanding.   Covid-19 vaccine status: Completed vaccines  Qualifies for Shingles Vaccine? Yes   Zostavax completed No   Shingrix Completed?: No.    Education has been provided regarding the importance of this vaccine. Patient has been advised to call insurance company to determine out of pocket expense if they have not yet received this vaccine. Advised may also receive vaccine at local pharmacy or Health Dept. Verbalized acceptance and understanding.  Screening Tests Health Maintenance  Topic Date Due   Zoster Vaccines- Shingrix (1 of 2) Never done   Pneumonia Vaccine 25+ Years old (1 of 1 - PCV) Never done   COVID-19 Vaccine (4 - 2023-24 season) 10/13/2021   INFLUENZA VACCINE  09/13/2022   Medicare Annual Wellness (AWV)  07/23/2023   DTaP/Tdap/Td (2 - Td or Tdap) 11/13/2027   HPV VACCINES  Aged Out    Health Maintenance  Health Maintenance Due  Topic Date Due    Zoster Vaccines- Shingrix (1 of 2) Never done   Pneumonia Vaccine 69+ Years old (1 of 1 - PCV) Never done   COVID-19 Vaccine (4 - 2023-24 season) 10/13/2021    Colorectal cancer screening: No longer required.   Lung Cancer Screening: (Low Dose CT Chest recommended if Age 26-80 years, 30 pack-year currently smoking OR have quit w/in 15years.) does not qualify.   Lung Cancer Screening Referral:  no  Additional Screening:  Hepatitis C Screening: does not qualify; Completed yes  Vision Screening: Recommended annual ophthalmology exams for early detection of glaucoma and other disorders of the eye. Is the patient up to date with their annual eye exam?  Yes  Who is the provider or what is the name of the office in which the patient attends annual eye exams? Dr Azucena Cecil If pt is not established with a provider, would they like to be referred to a provider to establish care? No .   Dental Screening: Recommended annual dental exams for proper oral hygiene  Community Resource Referral / Chronic Care Management: CRR required this visit?  No   CCM required this visit?  No     Plan:     I have personally reviewed and noted the following in the patient's chart:   Medical and social history Use of alcohol,  tobacco or illicit drugs  Current medications and supplements including opioid prescriptions. Patient is not currently taking opioid prescriptions. Functional ability and status Nutritional status Physical activity Advanced directives List of other physicians Hospitalizations, surgeries, and ER visits in previous 12 months Vitals Screenings to include cognitive, depression, and falls Referrals and appointments  In addition, I have reviewed and discussed with patient certain preventive protocols, quality metrics, and best practice recommendations. A written personalized care plan for preventive services as well as general preventive health recommendations were provided to patient.      Sue Lush, LPN   1/61/0960   Nurse Notes: pt lives alone in [place] [facility] and says he has numerous problems with mobility. He uses a walker and has what he calls terrible knee pain (both). He describes his pain on a 10 and nothing really helps it much. He relays they are trying to get someone to help him with household cleaning. I asked pt if he needed help with caring for himself: pt declined. Pt has no concerns or questions at this time.

## 2022-07-23 NOTE — Addendum Note (Signed)
Addended by: Sue Lush on: 07/23/2022 04:32 PM   Modules accepted: Level of Service

## 2022-07-23 NOTE — Patient Instructions (Addendum)
[name] , Thank you for taking time to come for your Medicare Wellness Visit. I appreciate your ongoing commitment to your health goals. Please review the following plan we discussed and let me know if I can assist you in the future.   These are the goals we discussed:  Goals   None     This is a list of the screening recommended for you and due dates:  Health Maintenance  Topic Date Due   Zoster (Shingles) Vaccine (1 of 2) Never done   Pneumonia Vaccine (1 of 1 - PCV) Never done   COVID-19 Vaccine (4 - 2023-24 season) 10/13/2021   Flu Shot  09/13/2022   Medicare Annual Wellness Visit  07/23/2023   DTaP/Tdap/Td vaccine (2 - Td or Tdap) 11/13/2027   HPV Vaccine  Aged Out    Advanced directives: no  Conditions/risks identified: high falls risk  Next appointment: Follow up in one year for your annual wellness visit. 07/24/2023 @ 11:45pm  Preventive Care 65 Years and Older, Male  Preventive care refers to lifestyle choices and visits with your health care provider that can promote health and wellness. What does preventive care include? A yearly physical exam. This is also called an annual well check. Dental exams once or twice a year. Routine eye exams. Ask your health care provider how often you should have your eyes checked. Personal lifestyle choices, including: Daily care of your teeth and gums. Regular physical activity. Eating a healthy diet. Avoiding tobacco and drug use. Limiting alcohol use. Practicing safe sex. Taking low doses of aspirin every day. Taking vitamin and mineral supplements as recommended by your health care provider. What happens during an annual well check? The services and screenings done by your health care provider during your annual well check will depend on your age, overall health, lifestyle risk factors, and family history of disease. Counseling  Your health care provider may ask you questions about your: Alcohol use. Tobacco use. Drug  use. Emotional well-being. Home and relationship well-being. Sexual activity. Eating habits. History of falls. Memory and ability to understand (cognition). Work and work Astronomer. Screening  You may have the following tests or measurements: Height, weight, and BMI. Blood pressure. Lipid and cholesterol levels. These may be checked every 5 years, or more frequently if you are over 70 years old. Skin check. Lung cancer screening. You may have this screening every year starting at age 58 if you have a 30-pack-year history of smoking and currently smoke or have quit within the past 15 years. Fecal occult blood test (FOBT) of the stool. You may have this test every year starting at age 7. Flexible sigmoidoscopy or colonoscopy. You may have a sigmoidoscopy every 5 years or a colonoscopy every 10 years starting at age 36. Prostate cancer screening. Recommendations will vary depending on your family history and other risks. Hepatitis C blood test. Hepatitis B blood test. Sexually transmitted disease (STD) testing. Diabetes screening. This is done by checking your blood sugar (glucose) after you have not eaten for a while (fasting). You may have this done every 1-3 years. Abdominal aortic aneurysm (AAA) screening. You may need this if you are a current or former smoker. Osteoporosis. You may be screened starting at age 5 if you are at high risk. Talk with your health care provider about your test results, treatment options, and if necessary, the need for more tests. Vaccines  Your health care provider may recommend certain vaccines, such as: Influenza vaccine. This is  recommended every year. Tetanus, diphtheria, and acellular pertussis (Tdap, Td) vaccine. You may need a Td booster every 10 years. Zoster vaccine. You may need this after age 80. Pneumococcal 13-valent conjugate (PCV13) vaccine. One dose is recommended after age 72. Pneumococcal polysaccharide (PPSV23) vaccine. One dose is  recommended after age 61. Talk to your health care provider about which screenings and vaccines you need and how often you need them. This information is not intended to replace advice given to you by your health care provider. Make sure you discuss any questions you have with your health care provider. Document Released: 02/25/2015 Document Revised: 10/19/2015 Document Reviewed: 11/30/2014 Elsevier Interactive Patient Education  2017 [employer].  Fall Prevention in the Home Falls can cause injuries. They can happen to people of all ages. There are many things you can do to make your home safe and to help prevent falls. What can I do on the outside of my home? Regularly fix the edges of walkways and driveways and fix any cracks. Remove anything that might make you trip as you walk through a door, such as a raised step or threshold. Trim any bushes or trees on the path to your home. Use bright outdoor lighting. Clear any walking paths of anything that might make someone trip, such as rocks or tools. Regularly check to see if handrails are loose or broken. Make sure that both sides of any steps have handrails. Any raised decks and porches should have guardrails on the edges. Have any leaves, snow, or ice cleared regularly. Use sand or salt on walking paths during winter. Clean up any spills in your garage right away. This includes oil or grease spills. What can I do in the bathroom? Use night lights. Install grab bars by the toilet and in the tub and shower. Do not use towel bars as grab bars. Use non-skid mats or decals in the tub or shower. If you need to sit down in the shower, use a plastic, non-slip stool. Keep the floor dry. Clean up any water that spills on the floor as soon as it happens. Remove soap buildup in the tub or shower regularly. Attach bath mats securely with double-sided non-slip rug tape. Do not have throw rugs and other things on the floor that can make you  trip. What can I do in the bedroom? Use night lights. Make sure that you have a light by your bed that is easy to reach. Do not use any sheets or blankets that are too big for your bed. They should not hang down onto the floor. Have a firm chair that has side arms. You can use this for support while you get dressed. Do not have throw rugs and other things on the floor that can make you trip. What can I do in the kitchen? Clean up any spills right away. Avoid walking on wet floors. Keep items that you use a lot in easy-to-reach places. If you need to reach something above you, use a strong step stool that has a grab bar. Keep electrical cords out of the way. Do not use floor polish or wax that makes floors slippery. If you must use wax, use non-skid floor wax. Do not have throw rugs and other things on the floor that can make you trip. What can I do with my stairs? Do not leave any items on the stairs. Make sure that there are handrails on both sides of the stairs and use them. Fix handrails that are  broken or loose. Make sure that handrails are as long as the stairways. Check any carpeting to make sure that it is firmly attached to the stairs. Fix any carpet that is loose or worn. Avoid having throw rugs at the top or bottom of the stairs. If you do have throw rugs, attach them to the floor with carpet tape. Make sure that you have a light switch at the top of the stairs and the bottom of the stairs. If you do not have them, ask someone to add them for you. What else can I do to help prevent falls? Wear shoes that: Do not have high heels. Have rubber bottoms. Are comfortable and fit you well. Are closed at the toe. Do not wear sandals. If you use a stepladder: Make sure that it is fully opened. Do not climb a closed stepladder. Make sure that both sides of the stepladder are locked into place. Ask someone to hold it for you, if possible. Clearly mark and make sure that you can  see: Any grab bars or handrails. First and last steps. Where the edge of each step is. Use tools that help you move around (mobility aids) if they are needed. These include: Canes. Walkers. Scooters. Crutches. Turn on the lights when you go into a dark area. Replace any light bulbs as soon as they burn out. Set up your furniture so you have a clear path. Avoid moving your furniture around. If any of your floors are uneven, fix them. If there are any pets around you, be aware of where they are. Review your medicines with your doctor. Some medicines can make you feel dizzy. This can increase your chance of falling. Ask your doctor what other things that you can do to help prevent falls. This information is not intended to replace advice given to you by your health care provider. Make sure you discuss any questions you have with your health care provider. Document Released: 11/25/2008 Document Revised: 07/07/2015 Document Reviewed: 03/05/2014 Elsevier Interactive Patient Education  2017 [employer].

## 2022-07-24 ENCOUNTER — Ambulatory Visit: Payer: Medicare HMO | Admitting: Podiatry

## 2022-07-24 DIAGNOSIS — L600 Ingrowing nail: Secondary | ICD-10-CM | POA: Diagnosis not present

## 2022-07-26 DIAGNOSIS — L600 Ingrowing nail: Secondary | ICD-10-CM | POA: Insufficient documentation

## 2022-07-26 NOTE — Progress Notes (Signed)
       Subjective:  Patient ID: Bobby Lane, male    DOB: 04-Jun-1940,  MRN: [id number]  Bobby Lane presents to clinic today for:  Chief Complaint  Patient presents with   Nail Problem    RM 13 Bilateral great hallux pain with pressure. Pt does have thickness aand discoloration to all toes but the hallux is what is bothering him  .  PCP is Alfredia Ferguson, PA-C.  Allergies  Allergen Reactions   Codeine Nausea And Vomiting   Morphine And Codeine Nausea And Vomiting   Novocain [Procaine] Hives    NO TROUBLE IN OR   Other Nausea And Vomiting   Percocet [Oxycodone-Acetaminophen] Nausea And Vomiting   Sulfa Antibiotics Hives   Acetaminophen    Atorvastatin    Rosuvastatin    Tramadol Nausea And Vomiting    Note: upset stomach Note: upset stomach     Review of Systems: Negative except as noted in the HPI.  Objective:  There were no vitals filed for this visit.  Bobby Lane is a pleasant 82 y.o. male in NAD. AAO x 3.  Vascular Examination: Capillary refill time is 3-5 seconds to toes bilateral. Palpable pedal pulses b/l LE. Digital hair present b/l. No pedal edema b/l. Skin temperature gradient WNL b/l. No varicosities b/l. No cyanosis or clubbing noted b/l.   Dermatological Examination: There is incurvation of the bilateral hallux prominent medial and lateral nail border.  There is pain on palpation of the affected nail border.  No active drainage is noted.  No purulence is expressed.  Neurological Examination: Protective sensation intact with Semmes-Weinstein 10 gram monofilament b/l LE.  Musculoskeletal Examination: Muscle strength 5/5 to all LE muscle groups b/l.      Latest Ref Rng & Units 06/07/2022    3:56 PM  Hemoglobin A1C  Hemoglobin-A1c 4.8 - 5.6 % 5.3     Assessment/Plan: 1. Ingrown toenail     Discussed patient's condition today.  After obtaining patient consent, the bilateral great toe was anesthetized with a 50:50 mixture of 1% lidocaine plain  and 0.5% bupivacaine plain for a total of 3cc's administered to each toe.  Upon confirmation of anesthesia, a freer elevator was utilized to free the medial and lateral nail border from the bilateral hallux nail bed.  The nail border was then avulsed proximal to the eponychium and removed in toto.  The area was inspected for any remaining spicules.  A chemical matrixectomy was performed with phenol and neutralized with alcohol solution.  Antibiotic ointment and a DSD were applied, followed by a Coban dressing.  Patient tolerated the anesthetic and procedure well and will f/u in 2-3 weeks for recheck.  Patient given post-procedure instructions for Epsom salt soaks, antibiotic ointment and daily use of Bandaids until toe starts to dry / form eschar.    Return in about 2 weeks (around 08/07/2022) for PNA recheck and RFC.   Clerance Lav, DPM, FACFAS [facility]     [address] [place], [place] [address]                Office [phone]  Fax [phone]

## 2022-07-27 ENCOUNTER — Telehealth: Payer: Self-pay

## 2022-07-27 NOTE — Telephone Encounter (Signed)
Copied from CRM [id number]. Topic: General - Other >> Jul 26, 2022  4:50 PM Santiya F wrote: Reason for CRM: Pt is calling because he had surgery on his toes and was instructed to soak his feet and then wrap them with gauze. Pt says he will need help doing that and wanted to know could a nurse or someone assist him with it. Please follow up with pt.

## 2022-07-27 NOTE — Telephone Encounter (Signed)
Pt is calling to follow up on a order to be placed for skilled nursing. Pt is needing assistance soaking feet and wrapping them with gauze. Please advise

## 2022-07-27 NOTE — Telephone Encounter (Signed)
Patient advised. Verbalized understanding

## 2022-08-01 ENCOUNTER — Telehealth: Payer: Self-pay | Admitting: Podiatry

## 2022-08-01 NOTE — Telephone Encounter (Signed)
Pt had bilateral hallux ingrown toenails removed on 07/24/22. Pt called and stated the both sides of both great toes are black. Offered pt to come to the [place] office on Friday but pt wants to keep appt on Tuesday in [place] with Dr. Carlota Raspberry. Pt also stated he has not been soaking his toes everyday due to not always having help.

## 2022-08-02 NOTE — Telephone Encounter (Signed)
Called pt to let him know that Dr. Carlota Raspberry stated it is most likely eschar forming from the procedure and that she would evaluate when he comes in on Tuesday for his follow up appt.

## 2022-08-07 ENCOUNTER — Ambulatory Visit: Payer: Medicare HMO | Admitting: Podiatry

## 2022-08-07 DIAGNOSIS — M79675 Pain in left toe(s): Secondary | ICD-10-CM | POA: Diagnosis not present

## 2022-08-07 DIAGNOSIS — B351 Tinea unguium: Secondary | ICD-10-CM | POA: Diagnosis not present

## 2022-08-07 DIAGNOSIS — M79674 Pain in right toe(s): Secondary | ICD-10-CM

## 2022-08-07 DIAGNOSIS — L600 Ingrowing nail: Secondary | ICD-10-CM | POA: Diagnosis not present

## 2022-08-07 NOTE — Progress Notes (Signed)
Subjective:  Patient ID: Bobby Lane, male    DOB: 12-Aug-1940,  MRN: [id number]   Bobby Lane presents to clinic today for:  Chief Complaint  Patient presents with   Ingrown Toenail    Bil great toe nails of black toes were wrapped on super tight. RFC  . Patient presents for bilateral P&A recheck to the hallux nails.  He had called the office last week noting some dark discoloration along the nail borders where the procedures were performed but was not able to come in sooner for an appointment.  He only soaks once and states that he did not have Epsom salt at home so he used dishwashing liquid and soaked his foot 1 time since the procedure was performed.  He has not been applying any Neosporin to the area until last night when his son came over.  His son wrapped the toes but he feels that his son wrapped him too tight his toes have been tender since yesterday, but previous to that he was not having any pain he is also here for routine footcare.  Patient notes nails are thick, discolored, elongated and painful in shoegear when trying to ambulate.    PCP is Bobby Ferguson, PA-C.  Allergies  Allergen Reactions   Codeine Nausea And Vomiting   Morphine And Codeine Nausea And Vomiting   Novocain [Procaine] Hives    NO TROUBLE IN OR   Other Nausea And Vomiting   Percocet [Oxycodone-Acetaminophen] Nausea And Vomiting   Sulfa Antibiotics Hives   Acetaminophen    Atorvastatin    Rosuvastatin    Tramadol Nausea And Vomiting    Note: upset stomach Note: upset stomach     Review of Systems: Negative except as noted in the HPI.  Objective:  There were no vitals filed for this visit.  Bobby Lane is a pleasant 82 y.o. male in NAD. AAO x 3.  Vascular Examination: Capillary refill time is 3-5 seconds to toes bilateral. Palpable pedal pulses b/l LE. Digital hair present b/l. No pedal edema b/l. Skin temperature gradient WNL b/l. No varicosities b/l. No cyanosis or clubbing noted  b/l.   Dermatological Examination: Pedal skin with normal turgor, texture and tone b/l. No open wounds. No interdigital macerations b/l. Toenails x10 are 3mm thick, discolored, dystrophic with subungual debris. There is pain with compression of the nail plates.  They are elongated x10.  On the PNA side of the bilateral hallux medial and lateral nail borders there is eschar forming on the medial and lateral aspects which are stable.  Small blister is seen on the right hallux medial proximal nail fold from the matrixectomy via chemical.  This is stable as well.  No surrounding erythema is noted.     Latest Ref Rng & Units 06/07/2022    3:56 PM  Hemoglobin A1C  Hemoglobin-A1c 4.8 - 5.6 % 5.3    Assessment/Plan: 1. Pain due to onychomycosis of toenails of both feet   2. Ingrown toenail     The mycotic toenails were sharply debrided x10 with sterile nail nippers and a power debriding burr to decrease bulk/thickness and length.    Patient instructed to continue with daily dressing changes to the PNA sites of the hallux nails until Friday.  He can then discontinue all instructions.  He was also instructed not to pick the scab from the area as it will cause the area to begin draining again and could get infected.  Patient  was informed that the dark discoloration is the eschar forming.  He had called the office questioning this in the past week.  He was informed that the area looks stable and is healing well.  The hallux toes were redressed today with a light gauze dressing applied.  He can replace this tomorrow  Follow-up 3 months for routine footcare  Kemba Hoppes D. Joshua Zeringue, DPM, FACFAS [facility]     [address] [place], [place] [address]                Office [phone]  Fax [phone]

## 2022-08-27 ENCOUNTER — Telehealth: Payer: Self-pay | Admitting: Physician Assistant

## 2022-08-27 NOTE — Telephone Encounter (Signed)
[facility] pharmacy is requesting prescription refill losartan (COZAAR) 100 MG tablet   Please advise

## 2022-08-31 ENCOUNTER — Encounter: Payer: Self-pay | Admitting: Pharmacist

## 2022-08-31 NOTE — Progress Notes (Signed)
Patient previously followed by [facility] pharmacist. Per clinical review, no pharmacist appointment needed at this time.

## 2022-10-16 ENCOUNTER — Ambulatory Visit (INDEPENDENT_AMBULATORY_CARE_PROVIDER_SITE_OTHER): Payer: 59 | Admitting: Physician Assistant

## 2022-10-16 ENCOUNTER — Encounter: Payer: Self-pay | Admitting: Physician Assistant

## 2022-10-16 VITALS — BP 123/81 | HR 79 | Ht 67.0 in | Wt 201.7 lb

## 2022-10-16 DIAGNOSIS — R051 Acute cough: Secondary | ICD-10-CM

## 2022-10-16 DIAGNOSIS — U071 COVID-19: Secondary | ICD-10-CM | POA: Diagnosis not present

## 2022-10-16 MED ORDER — NIRMATRELVIR/RITONAVIR (PAXLOVID) TABLET (RENAL DOSING)
2.0000 | ORAL_TABLET | Freq: Two times a day (BID) | ORAL | 0 refills | Status: AC
Start: 2022-10-16 — End: 2022-10-21

## 2022-10-16 MED ORDER — GUAIFENESIN ER 600 MG PO TB12: 600.0000 mg | ORAL_TABLET | Freq: Two times a day (BID) | ORAL | 0 refills | Status: DC

## 2022-10-16 NOTE — Progress Notes (Signed)
Established patient visit  Patient: Bobby Lane   DOB: 27-Aug-1940   82 y.o. Male  MRN: [id number] Visit Date: 10/16/2022  Today's healthcare provider: Debera Lat, PA-C   Chief Complaint  Patient presents with   Medical Management of Chronic Issues    Has a possible skin cancer or cyst on the right side of neck, does not know how long its been there. Also has a cough and would like to be tested for covid    Subjective     HPI     Medical Management of Chronic Issues    Additional comments: Has a possible skin cancer or cyst on the right side of neck, does not know how long its been there. Also has a cough and would like to be tested for covid       Last edited by Rolly Salter, CMA on 10/16/2022  4:06 PM.            07/23/2022    2:17 PM 06/07/2022    3:25 PM 10/16/2019    1:00 PM  Depression screen PHQ 2/9  Decreased Interest 0 0 1  Down, Depressed, Hopeless 0 0 1  PHQ - 2 Score 0 0 2  Altered sleeping  0 1  Tired, decreased energy  0 1  Change in appetite  0 2  Feeling bad or failure about yourself   0 0  Trouble concentrating  0 1  Moving slowly or fidgety/restless  0 0  Suicidal thoughts  0 0  PHQ-9 Score  0 7  Difficult doing work/chores  Not difficult at all Somewhat difficult      01/26/2019    4:30 PM  GAD 7 : Generalized Anxiety Score  Nervous, Anxious, on Edge 1  Control/stop worrying 2  Worry too much - different things 3  Trouble relaxing 2  Restless 0  Easily annoyed or irritable 0  Afraid - awful might happen 1  Total GAD 7 Score 9  Anxiety Difficulty Somewhat difficult    Medications: Outpatient Medications Prior to Visit  Medication Sig   acetaminophen (TYLENOL) 650 MG CR tablet Take 1,300 mg by mouth every 8 (eight) hours as needed for pain.   albuterol (VENTOLIN HFA) 108 (90 Base) MCG/ACT inhaler    allopurinol (ZYLOPRIM) 300 MG tablet Take 1 tablet (300 mg total) by mouth daily.   carvedilol (COREG) 12.5 MG tablet Take 1 tablet  (12.5 mg total) by mouth 2 (two) times daily.   Cholecalciferol (VITAMIN D3) 5000 UNITS TABS Take 5,000 Units by mouth daily.   cyanocobalamin 1000 MCG tablet Take 1,000 mcg by mouth daily.   DULoxetine (CYMBALTA) 30 MG capsule Take 1 capsule (30 mg total) by mouth 2 (two) times daily.   GEMTESA 75 MG TABS Take 1 tablet (75 mg total) by mouth daily.   levothyroxine (SYNTHROID) 125 MCG tablet Take 1 tablet (125 mcg total) by mouth daily.   losartan (COZAAR) 100 MG tablet Take 1 tablet (100 mg total) by mouth daily.   REPATHA SURECLICK 140 MG/ML SOAJ Inject 140 mg into the skin every 14 (fourteen) days.   No facility-administered medications prior to visit.    Review of Systems  All other systems reviewed and are negative.  Except see HPI       Objective    BP 123/81 (BP Location: Right Arm, Patient Position: Sitting, Cuff Size: Large)   Pulse 79   Ht 5\' 7"  (1.702 m)   Wt 201 lb 11.2 oz (  91.5 kg)   SpO2 96%   BMI 31.59 kg/m     Physical Exam Constitutional:      General: He is not in acute distress.    Appearance: Normal appearance. He is not diaphoretic.  HENT:     Head: Normocephalic.  Eyes:     Conjunctiva/sclera: Conjunctivae normal.  Pulmonary:     Effort: Pulmonary effort is normal. No respiratory distress.  Neurological:     Mental Status: He is alert and oriented to person, place, and time. Mental status is at baseline.      No results found for any visits on 10/16/22.  Assessment & Plan    1. COVID-19 X 4days, within the window.   Discussed drug interactions between her current medications and paxlovid Counseled risk/benefits of medications. Expected course of illness. Potential rebound symptoms and potential complications and to call if sx worsen or not much better when finished with Paxlovid.  Advised to hold ambien and percocet and restart 2 days after completing paxlovid Continue symptomatic treatment and follow isolation guidelines  -  nirmatrelvir/ritonavir, renal dosing, (PAXLOVID) 10 x 150 MG & 10 x 100MG  TABS; Take 2 tablets by mouth 2 (two) times daily for 5 days. (Take nirmatrelvir 150 mg one tablet twice daily for 5 days and ritonavir 100 mg one tablet twice daily for 5 days) Patient GFR is 45  Dispense: 20 tablet; Refill: 0 - guaiFENesin (MUCINEX) 600 MG 12 hr tablet; Take 1 tablet (600 mg total) by mouth 2 (two) times daily.  Dispense: 20 tablet; Refill: 0 Positive COVID: Please remain in isolation until 12/09/2021 . you can leave isolation at that time if you are feeling better and haven't had fever in 24 hours. Continue to mask until 10/22/22. Please see COVID -19 exit care instructions. **Seek immediate medical attention for difficulty breathing, persistent pain or pressure in the chest, new confusion or inability to arouse, bluish lips or face, neurological changes (weakness, facial droop, loss of sensation), or severe rash.  Preventing Corona Virus (COVID-19) spread   Stay home except to get medical care Avoid activities outside your home, except for getting medical care. Do not go to work, school, or public areas. Avoid using public transportation, ride-sharing, or taxis.  Separate yourself from other people in your home As much as possible, stay in a different room from other people in your home. Use a separate bathroom, if available. Do not have visitors into your home.  Call ahead before visiting your doctor  Before your medical appointment, call the healthcare provider and tell them that you have, or are being assessed for COVID-19. This will help the healthcare provider's office take steps to keep other people from getting infected.  Wear a facemask Wear a facemask when you are in the same room with other people and when you visit a healthcare provider. If you cannot wear a facemask, the people who live with you should wear one while they are in the same room with you.  Cover your coughs and sneezes Cover your  mouth and nose with a tissue when you cough or sneeze and throw out the tissue immediately after use. If there is no tissue available, you can cough or sneeze into your sleeve/arm. Immediately wash your hands with soap and water or use hand sanitizer.   Wash your [employer] your hands often and thoroughly with soap and water for at least 20 seconds. You can use an alcohol-based hand sanitizer if soap and water are not  available and if your hands are not visibly dirty. Avoid touching your eyes, nose, and mouth with unwashed hands.  Avoid sharing household items  Do not share dishes, drinking glasses, cups, eating utensils, towels, bedding, or other items with other people in your home. After using these items, you should wash them thoroughly with soap and water.  Clean all "high-touch" surfaces everyday High touch surfaces include counters, tabletops, doorknobs, bathroom fixtures, toilets, phones, keyboards, tablets, and bedside tables.  If you have a medical emergency and need to call 911, notify the dispatch personnel that you have, or are being evaluated for COVID-19.   2. Acute cough Considering the complicated cardiovascular history of this patient, advised: - guaiFENesin (MUCINEX) 600 MG 12 hr tablet; Take 1 tablet (600 mg total) by mouth 2 (two) times daily.  Dispense: 20 tablet; Refill:   3 Cyst? Considering pt having covid-19, advised to reassess after the recovery and completion of his isolation. Pt needs to call back to clinic for FU.  No follow-ups on file.     The patient was advised to call back or seek an in-person evaluation if the symptoms worsen or if the condition fails to improve as anticipated.  I discussed the assessment and treatment plan with the patient. The patient was provided an opportunity to ask questions and all were answered. The patient agreed with the plan and demonstrated an understanding of the instructions.  I, Debera Lat, PA-C have reviewed all  documentation for this visit. The documentation on  10/16/22  for the exam, diagnosis, procedures, and orders are all accurate and complete.  Debera Lat, [facility], MMS [facility] [phone] (phone) [phone] (fax)  [facility]

## 2022-10-23 ENCOUNTER — Ambulatory Visit: Payer: Medicare HMO | Admitting: Podiatry

## 2022-10-29 ENCOUNTER — Ambulatory Visit (INDEPENDENT_AMBULATORY_CARE_PROVIDER_SITE_OTHER): Payer: 59 | Admitting: Podiatry

## 2022-10-29 ENCOUNTER — Encounter: Payer: Self-pay | Admitting: Podiatry

## 2022-10-29 DIAGNOSIS — B351 Tinea unguium: Secondary | ICD-10-CM | POA: Diagnosis not present

## 2022-10-29 DIAGNOSIS — M79675 Pain in left toe(s): Secondary | ICD-10-CM

## 2022-10-29 DIAGNOSIS — M79674 Pain in right toe(s): Secondary | ICD-10-CM | POA: Diagnosis not present

## 2022-11-03 NOTE — Progress Notes (Signed)
Subjective:  Patient ID: Bobby Lane, male    DOB: 01-23-1941,  MRN: [id number]  Bobby Lane presents to clinic today for painful elongated mycotic toenails 1-5 bilaterally which are tender when wearing enclosed shoe gear. Pain is relieved with periodic professional debridement. He had ingrown toenails of medial borders b/l great toes removed by Dr. Burna Mortimer. States toes feel better now.  New problem(s): None.   PCP is [place], [place], PA-C.  Allergies  Allergen Reactions   Codeine Nausea And Vomiting   Morphine And Codeine Nausea And Vomiting   Novocain [Procaine] Hives    NO TROUBLE IN OR   Other Nausea And Vomiting   Percocet [Oxycodone-Acetaminophen] Nausea And Vomiting   Sulfa Antibiotics Hives   Acetaminophen    Atorvastatin    Rosuvastatin    Tramadol Nausea And Vomiting    Note: upset stomach Note: upset stomach    Review of Systems: Negative except as noted in the HPI.  Objective: No changes noted in today's physical examination. There were no vitals filed for this visit. Bobby Lane is a pleasant 82 y.o. male in NAD. AAO x 3.  Vascular Examination: Capillary refill time immediate b/l. Vascular status intact b/l with palpable pedal pulses. Pedal hair present b/l. No pain with calf compression b/l. Skin temperature gradient WNL b/l. No cyanosis or clubbing b/l. No ischemia or gangrene noted b/l.   Neurological Examination: Sensation grossly intact b/l with 10 gram monofilament. Vibratory sensation intact b/l.   Dermatological Examination: Pedal skin with normal turgor, texture and tone b/l.  No open wounds. No interdigital macerations.   Toenails 1-5 b/l thick, discolored, elongated with subungual debris and pain on dorsal palpation.   Procedure site of bilateral great toes noted to be completely healed with no erythema, no edema, no drainage, no purulence.   Musculoskeletal Examination: Normal muscle strength 5/5 to all lower extremity muscle groups  bilaterally. No pain, crepitus or joint limitation noted with ROM b/l LE. No gross bony pedal deformities b/l. Patient ambulates independently without assistive aids.  Radiographs: None  Last A1c:      Latest Ref Rng & Units 06/07/2022    3:56 PM  Hemoglobin A1C  Hemoglobin-A1c 4.8 - 5.6 % 5.3    Assessment/Plan: 1. Pain due to onychomycosis of toenails of both feet    -Consent given for treatment as described below: -Examined patient. -Patient to continue soft, supportive shoe gear daily. -Toenails 1-5 b/l were debrided in length and girth with sterile nail nippers and dremel without iatrogenic bleeding.  -Patient/POA to call should there be question/concern in the interim.   Return in about 3 months (around 01/28/2023).  Freddie Breech, DPM

## 2022-11-12 ENCOUNTER — Ambulatory Visit: Payer: Medicare HMO | Admitting: Podiatry

## 2022-12-31 ENCOUNTER — Ambulatory Visit: Payer: 59 | Admitting: Medical

## 2022-12-31 NOTE — Progress Notes (Deleted)
Cardiology Office Note:    Date:  12/31/2022   ID:  Bobby Lane, DOB 30-Sep-1940, MRN [id number]  PCP:  Debera Lat, PA-C  [facility] Cardiologist:  Meriam Sprague, MD (Inactive)  [facility] Electrophysiologist:  None   Referring MD: Debera Lat, PA-C   Chief Complaint: 6 month follow-up  History of Present Illness:    Bobby Lane is a 82 y.o. male with a hx of COPD, CAD, gout, HTN, and GERD who presents for follow-up of palpitations.  Patient was initially seen 03/2020 with palpitations. Heart monitor showed 55 episodes of SVT with longest lasting 13 seconds, no Afib or sustained arrhythmias. TTE 05/2020 showed hyperdynamic LVSF with intracavitary gradient, G1DD, no significant valve disease. He was started on metoprolol.   He was last seen 06/2022 and was stable from a cardiac perspective.   Today,   Past Medical History:  Diagnosis Date   Allergy    Arthritis    Cancer (HCC)    SKIN   COPD (chronic obstructive pulmonary disease) (HCC)    Coronary artery disease    Depression    Dyspnea    DOE   GERD (gastroesophageal reflux disease)    Gout    Heart murmur    History of hiatal hernia    History of kidney stones    History of wheezing    HOH (hard of hearing)    Hypertension    Hypothyroidism    Oxygen deficit    PRN USE; used ~ 2014 for less than a year (no longer has as of 07/15/19)   Pneumonia    Renal disorder    Thyroid disease     Past Surgical History:  Procedure Laterality Date   ANTERIOR CERVICAL DECOMP/DISCECTOMY FUSION N/A 07/16/2019   Procedure: Cervical three-four Cervical five-six Cervical six-seven Anterior cervical decompression/discectomy/fusion;  Surgeon: Maeola Harman, MD;  Location: [facility] OR;  Service: Neurosurgery;  Laterality: N/A;  3C   CARDIAC CATHETERIZATION     CARPAL TUNNEL RELEASE Left 07/16/2019   Procedure: LEFT CARPAL TUNNEL RELEASE;  Surgeon: Maeola Harman, MD;  Location: [facility] OR;  Service: Neurosurgery;  Laterality:  Left;   CATARACT EXTRACTION W/PHACO Right 08/21/2016   Procedure: CATARACT EXTRACTION PHACO AND INTRAOCULAR LENS PLACEMENT (IOC);  Surgeon: Galen Manila, MD;  Location: [facility] ORS;  Service: Ophthalmology;  Laterality: Right;  [place] 00:54.9 AP% 18.3 CDE 10.04 Fluid pack lot # [id number]   CATARACT EXTRACTION W/PHACO Left 09/18/2016   Procedure: CATARACT EXTRACTION PHACO AND INTRAOCULAR LENS PLACEMENT (IOC);  Surgeon: Galen Manila, MD;  Location: [facility] ORS;  Service: Ophthalmology;  Laterality: Left;  [place] 00:56 AP% 19.7 CDE 11.07 Fluid pack lot # [id number]   FRACTURE SURGERY     ANKLE   HERNIA REPAIR     UPPER GI ENDOSCOPY      Current Medications: No outpatient medications have been marked as taking for the 12/31/22 encounter (Appointment) with Fransico Michael, Lorrin Nawrot H, PA-C.     Allergies:   Codeine, Morphine and codeine, Novocain [procaine], Other, Percocet [oxycodone-acetaminophen], Sulfa antibiotics, Acetaminophen, Atorvastatin, Rosuvastatin, and Tramadol   Social History   Socioeconomic History   Marital status: Single    Spouse name: Not on file   Number of children: Not on file   Years of education: Not on file   Highest education level: Not on file  Occupational History   Not on file  Tobacco Use   Smoking status: Never   Smokeless tobacco: Never  Vaping Use   Vaping status:  Never Used  Substance and Sexual Activity   Alcohol use: No   Drug use: No   Sexual activity: Not Currently  Other Topics Concern   Not on file  Social History Narrative   Not on file   Social Determinants of Health   Financial Resource Strain: Low Risk  (07/23/2022)   Overall Financial Resource Strain (CARDIA)    Difficulty of Paying Living Expenses: Not hard at all  Food Insecurity: No Food Insecurity (07/23/2022)   Hunger Vital Sign    Worried About Running Out of Food in the Last Year: Never true    Ran Out of Food in the Last Year: Never true  Transportation Needs: No Transportation Needs  (07/23/2022)   PRAPARE - Administrator, Civil Service (Medical): No    Lack of Transportation (Non-Medical): No  Physical Activity: Inactive (07/23/2022)   Exercise Vital Sign    Days of Exercise per Week: 0 days    Minutes of Exercise per Session: 0 min  Stress: No Stress Concern Present (07/23/2022)   [employer] of Occupational Health - Occupational Stress Questionnaire    Feeling of Stress : Not at all  Social Connections: Socially Isolated (07/23/2022)   Social Connection and Isolation Panel [NHANES]    Frequency of Communication with Friends and Family: Never    Frequency of Social Gatherings with Friends and Family: Never    Attends Religious Services: Never    Database administrator or Organizations: No    Attends Engineer, structural: Never    Marital Status: Divorced     Family History: The patient's ***family history includes Brain cancer in his father; Cancer in his paternal uncle. There is no history of Prostate cancer, Bladder Cancer, or Kidney cancer.  ROS:   Please see the history of present illness.    *** All other systems reviewed and are negative.  EKGs/Labs/Other Studies Reviewed:    The following studies were reviewed today: ***  EKG:  EKG is *** ordered today.  The ekg ordered today demonstrates ***  Recent Labs: 06/07/2022: ALT 19; BUN 27; Creatinine, Ser 1.54; Hemoglobin 14.9; Platelets 247; Potassium 5.6; Sodium 138; TSH 7.580  Recent Lipid Panel    Component Value Date/Time   CHOL 213 (H) 06/07/2022 1556   TRIG 273 (H) 06/07/2022 1556   HDL 26 (L) 06/07/2022 1556   CHOLHDL 8.2 (H) 06/07/2022 1556   LDLCALC 137 (H) 06/07/2022 1556     Risk Assessment/Calculations:   {Does this patient have ATRIAL FIBRILLATION?:[phone]}   Physical Exam:    VS:  There were no vitals taken for this visit.    Wt Readings from Last 3 Encounters:  10/16/22 201 lb 11.2 oz (91.5 kg)  07/23/22 210 lb (95.3 kg)  07/04/22 210 lb (95.3  kg)     GEN: *** Well nourished, well developed in no acute distress HEENT: Normal NECK: No JVD; No carotid bruits LYMPHATICS: No lymphadenopathy CARDIAC: ***RRR, no murmurs, rubs, gallops RESPIRATORY:  Clear to auscultation without rales, wheezing or rhonchi  ABDOMEN: Soft, non-tender, non-distended MUSCULOSKELETAL:  No edema; No deformity  SKIN: Warm and dry NEUROLOGIC:  Alert and oriented x 3 PSYCHIATRIC:  Normal affect   ASSESSMENT:    No diagnosis found. PLAN:    In order of problems listed above:  ***  Disposition: Follow up {follow up:15908} with ***   Shared Decision Making/Informed Consent   {Are you ordering a CV Procedure (e.g. stress test, cath, DCCV, TEE, etc)?  Press F2        :[id number]}    Signed, Austyn Perriello David Stall, PA-C  12/31/2022 8:03 AM    [facility]

## 2023-01-03 ENCOUNTER — Ambulatory Visit: Payer: 59 | Attending: Medical | Admitting: Medical

## 2023-01-03 ENCOUNTER — Encounter: Payer: Self-pay | Admitting: Medical

## 2023-01-03 VITALS — BP 125/84 | HR 100 | Ht 65.0 in | Wt 201.7 lb

## 2023-01-03 DIAGNOSIS — R011 Cardiac murmur, unspecified: Secondary | ICD-10-CM | POA: Diagnosis not present

## 2023-01-03 DIAGNOSIS — E782 Mixed hyperlipidemia: Secondary | ICD-10-CM

## 2023-01-03 DIAGNOSIS — I2510 Atherosclerotic heart disease of native coronary artery without angina pectoris: Secondary | ICD-10-CM

## 2023-01-03 DIAGNOSIS — I471 Supraventricular tachycardia, unspecified: Secondary | ICD-10-CM | POA: Diagnosis not present

## 2023-01-03 DIAGNOSIS — I10 Essential (primary) hypertension: Secondary | ICD-10-CM | POA: Diagnosis not present

## 2023-01-03 MED ORDER — ROSUVASTATIN CALCIUM 40 MG PO TABS: 40.0000 mg | ORAL_TABLET | Freq: Every day | ORAL | 3 refills | Status: DC

## 2023-01-03 NOTE — Patient Instructions (Signed)
Medication Instructions:  Your physician has recommended you make the following change in your medication:   STOP Repatha START Crestor 40 mg once daily    *If you need a refill on your cardiac medications before your next appointment, please call your pharmacy*   Lab Work: CBC & BMET today here in the office.   If you have labs (blood work) drawn today and your tests are completely normal, you will receive your results only by: MyChart Message (if you have MyChart) OR A paper copy in the mail If you have any lab test that is abnormal or we need to change your treatment, we will call you to review the results.   Testing/Procedures: Your physician has requested that you have an echocardiogram. Echocardiography is a painless test that uses sound waves to create images of your heart. It provides your doctor with information about the size and shape of your heart and how well your heart's chambers and valves are working. This procedure takes approximately one hour. There are no restrictions for this procedure. Please do NOT wear cologne, perfume, aftershave, or lotions (deodorant is allowed). Please arrive 15 minutes prior to your appointment time.  Please note: We ask at that you not bring children with you during ultrasound (echo/ vascular) testing. Due to room size and safety concerns, children are not allowed in the ultrasound rooms during exams. Our front office staff cannot provide observation of children in our lobby area while testing is being conducted. An adult accompanying a patient to their appointment will only be allowed in the ultrasound room at the discretion of the ultrasound technician under special circumstances. We apologize for any inconvenience.    Follow-Up: At [facility], you and your health needs are our priority.  As part of our continuing mission to provide you with exceptional heart care, we have created designated Provider Care Teams.  These Care Teams  include your primary Cardiologist (physician) and Advanced Practice Providers (APPs -  Physician Assistants and Nurse Practitioners) who all work together to provide you with the care you need, when you need it.  We recommend signing up for the patient portal called "MyChart".  Sign up information is provided on this After Visit Summary.  MyChart is used to connect with patients for Virtual Visits (Telemedicine).  Patients are able to view lab/test results, encounter notes, upcoming appointments, etc.  Non-urgent messages can be sent to your provider as well.   To learn more about what you can do with MyChart, go to [email or web address].    Your next appointment:   4 month(s)  Provider:   You may see Meriam Sprague, MD (Inactive) or one of the following Advanced Practice Providers on your designated Care Team:   Nicolasa Ducking, NP Eula Listen, PA-C Cadence Fransico Michael, PA-C Charlsie Quest, NP Carlos Levering, NP

## 2023-01-03 NOTE — Progress Notes (Signed)
Cardiology Office Note:    Date:  01/03/2023   ID:  Bobby Lane, DOB [birth year], MRN [id number]  PCP:  Emily Filbert, MD  [facility] Cardiologist:  Meriam Sprague, MD (Inactive)  [facility] Electrophysiologist:  None   Referring MD: Debera Lat, PA-C   Chief Complaint: 6 month follow-up  History of Present Illness:    Bobby Lane is a 82 y.o. male with a hx of COPD, CAD, gout, HTN and GERD who presents for 6 month follow-up.   The patient was seen 03/2020 for palpitations. Heart monitor showed 55 episodes of SVT, lonest lasting 13 seconds, no Afib. TTE 05/2020 showed hyperdynamic LVSF with intracavitary gradient, G1DD, no significant vale disease.  The patient was last seen 06/2022 and was stable from a cardiac perspective.   Today, the patient is overall doing OK. He has headaches sometimes. He also gets short of breath when he is exerting himself. He is mostly sedentary and sits in a chair most of the day. He denies orthopnea or pnd. He denies chest pain, lower leg edema. He has occasional lightheadedness. He has not been taking Repatha.   Past Medical History:  Diagnosis Date   Allergy    Arthritis    Cancer (HCC)    SKIN   COPD (chronic obstructive pulmonary disease) (HCC)    Coronary artery disease    Depression    Dyspnea    DOE   GERD (gastroesophageal reflux disease)    Gout    Heart murmur    History of hiatal hernia    History of kidney stones    History of wheezing    HOH (hard of hearing)    Hypertension    Hypothyroidism    Oxygen deficit    PRN USE; used ~ 2014 for less than a year (no longer has as of 07/15/19)   Pneumonia    Renal disorder    Thyroid disease     Past Surgical History:  Procedure Laterality Date   ANTERIOR CERVICAL DECOMP/DISCECTOMY FUSION N/A 07/16/2019   Procedure: Cervical three-four Cervical five-six Cervical six-seven Anterior cervical decompression/discectomy/fusion;  Surgeon: Maeola Harman, MD;  Location:  [facility] OR;  Service: Neurosurgery;  Laterality: N/A;  3C   CARDIAC CATHETERIZATION     CARPAL TUNNEL RELEASE Left 07/16/2019   Procedure: LEFT CARPAL TUNNEL RELEASE;  Surgeon: Maeola Harman, MD;  Location: [facility] OR;  Service: Neurosurgery;  Laterality: Left;   CATARACT EXTRACTION W/PHACO Right 08/21/2016   Procedure: CATARACT EXTRACTION PHACO AND INTRAOCULAR LENS PLACEMENT (IOC);  Surgeon: Galen Manila, MD;  Location: [facility] ORS;  Service: Ophthalmology;  Laterality: Right;  [place] 00:54.9 AP% 18.3 CDE 10.04 Fluid pack lot # [id number]   CATARACT EXTRACTION W/PHACO Left 09/18/2016   Procedure: CATARACT EXTRACTION PHACO AND INTRAOCULAR LENS PLACEMENT (IOC);  Surgeon: Galen Manila, MD;  Location: [facility] ORS;  Service: Ophthalmology;  Laterality: Left;  [place] 00:56 AP% 19.7 CDE 11.07 Fluid pack lot # [id number]   FRACTURE SURGERY     ANKLE   HERNIA REPAIR     UPPER GI ENDOSCOPY      Current Medications: Current Meds  Medication Sig   acetaminophen (TYLENOL) 650 MG CR tablet Take 1,300 mg by mouth every 8 (eight) hours as needed for pain.   allopurinol (ZYLOPRIM) 300 MG tablet Take 1 tablet (300 mg total) by mouth daily.   carvedilol (COREG) 12.5 MG tablet Take 1 tablet (12.5 mg total) by mouth 2 (two) times daily.   Cholecalciferol (VITAMIN  D3) 5000 UNITS TABS Take 5,000 Units by mouth daily.   cyanocobalamin 1000 MCG tablet Take 1,000 mcg by mouth daily.   DULoxetine (CYMBALTA) 30 MG capsule Take 1 capsule (30 mg total) by mouth 2 (two) times daily.   GEMTESA 75 MG TABS Take 1 tablet (75 mg total) by mouth daily.   levothyroxine (SYNTHROID) 125 MCG tablet Take 1 tablet (125 mcg total) by mouth daily.   losartan (COZAAR) 100 MG tablet Take 1 tablet (100 mg total) by mouth daily.   rosuvastatin (CRESTOR) 40 MG tablet Take 1 tablet (40 mg total) by mouth daily.     Allergies:   Codeine, Morphine and codeine, Novocain [procaine], Other, Percocet [oxycodone-acetaminophen], Sulfa antibiotics,  Acetaminophen, Atorvastatin, Rosuvastatin, and Tramadol   Social History   Socioeconomic History   Marital status: Single    Spouse name: Not on file   Number of children: Not on file   Years of education: Not on file   Highest education level: Not on file  Occupational History   Not on file  Tobacco Use   Smoking status: Never   Smokeless tobacco: Never  Vaping Use   Vaping status: Never Used  Substance and Sexual Activity   Alcohol use: No   Drug use: No   Sexual activity: Not Currently  Other Topics Concern   Not on file  Social History Narrative   Not on file   Social Determinants of Health   Financial Resource Strain: Low Risk  (07/23/2022)   Overall Financial Resource Strain (CARDIA)    Difficulty of Paying Living Expenses: Not hard at all  Food Insecurity: No Food Insecurity (07/23/2022)   Hunger Vital Sign    Worried About Running Out of Food in the Last Year: Never true    Ran Out of Food in the Last Year: Never true  Transportation Needs: No Transportation Needs (07/23/2022)   PRAPARE - Administrator, Civil Service (Medical): No    Lack of Transportation (Non-Medical): No  Physical Activity: Inactive (07/23/2022)   Exercise Vital Sign    Days of Exercise per Week: 0 days    Minutes of Exercise per Session: 0 min  Stress: No Stress Concern Present (07/23/2022)   [employer] of Occupational Health - Occupational Stress Questionnaire    Feeling of Stress : Not at all  Social Connections: Socially Isolated (07/23/2022)   Social Connection and Isolation Panel [NHANES]    Frequency of Communication with Friends and Family: Never    Frequency of Social Gatherings with Friends and Family: Never    Attends Religious Services: Never    Database administrator or Organizations: No    Attends Engineer, structural: Never    Marital Status: Divorced     Family History: The patient's family history includes Brain cancer in his father; Cancer in  his paternal uncle. There is no history of Prostate cancer, Bladder Cancer, or Kidney cancer.  ROS:   Please see the history of present illness.     All other systems reviewed and are negative.  EKGs/Labs/Other Studies Reviewed:    The following studies were reviewed today:  Echo 2022  1. Intracavitary gradient at rest. Peak velocity 1.87 m/s. Peak gradient  14 mmHg. Peak gradient increases to 36 mmHg and peak velocity increases to  3 m/s with Valsalva. Left ventricular ejection fraction, by estimation, is  70 to 75%. The left ventricle  has hyperdynamic function. The left ventricle has no regional wall motion  abnormalities. Left ventricular diastolic parameters are consistent with  Grade I diastolic dysfunction (impaired relaxation).   2. Right ventricular systolic function is normal. The right ventricular  size is normal.   3. The mitral valve is normal in structure. No evidence of mitral valve  regurgitation. No evidence of mitral stenosis.   4. The aortic valve is normal in structure. Aortic valve regurgitation is  not visualized. No aortic stenosis is present.   5. The inferior vena cava is normal in size with greater than 50%  respiratory variability, suggesting right atrial pressure of 3 mmHg.   Comparison(s): 06/23/13 EF 60-65%.   Heart monitor 2022 Patch wear time 13 days and 17 hours Predominant rhythm is NSR with average HR 70; ranging from 50-187bpm There were 55 episodes of SVT with longest lasting 13.2 seconds at rate 187bpm (also the fastest episode); did not correlate with patient triggered event. Isolated SVEs and PVCs were rate Ventricular bigeminy and trigeminy were present No Afib, significant pauses or VT     Patch Wear Time:  13 days and 17 hours (2022-02-17T15:00:04-498 to 2022-03-03T08:36:54-0500)   Patient had a min HR of 50 bpm, max HR of 187 bpm, and avg HR of 70 bpm. Predominant underlying rhythm was Sinus Rhythm. 55 Supraventricular Tachycardia  runs occurred, the run with the fastest interval lasting 13.2 secs with a max rate of 187 bpm (avg  149 bpm); the run with the fastest interval was also the longest. Idioventricular Rhythm was present. Ectopic Atrial Rhythm was present. Isolated SVEs were rare (<1.0%), SVE Couplets were rare (<1.0%), and SVE Triplets were rare (<1.0%). Isolated VEs  were rare (<1.0%, 5778), VE Couplets were rare (<1.0%, 36), and VE Triplets were rare (<1.0%, 1). Ventricular Bigeminy and Trigeminy were present.    Laurance Flatten, MD  EKG:  EKG is  ordered today.  The ekg ordered today demonstrates NSR, 100bpm, TWI aVL  Recent Labs: 06/07/2022: ALT 19; BUN 27; Creatinine, Ser 1.54; Hemoglobin 14.9; Platelets 247; Potassium 5.6; Sodium 138; TSH 7.580  Recent Lipid Panel    Component Value Date/Time   CHOL 213 (H) 06/07/2022 1556   TRIG 273 (H) 06/07/2022 1556   HDL 26 (L) 06/07/2022 1556   CHOLHDL 8.2 (H) 06/07/2022 1556   LDLCALC 137 (H) 06/07/2022 1556     Physical Exam:    VS:  BP 125/84 (BP Location: Left Arm, Patient Position: Sitting, Cuff Size: Normal)   Pulse 100   Ht 5\' 5"  (1.651 m)   Wt 201 lb 11.5 oz (91.5 kg)   SpO2 96%   BMI 33.57 kg/m     Wt Readings from Last 3 Encounters:  01/03/23 201 lb 11.5 oz (91.5 kg)  10/16/22 201 lb 11.2 oz (91.5 kg)  07/23/22 210 lb (95.3 kg)     GEN:  Well nourished, well developed in no acute distress HEENT: Normal NECK: No JVD; No carotid bruits LYMPHATICS: No lymphadenopathy CARDIAC: RRR, + murmur, no rubs, gallops RESPIRATORY:  Clear to auscultation without rales, wheezing or rhonchi  ABDOMEN: Soft, non-tender, non-distended MUSCULOSKELETAL:  No edema; No deformity  SKIN: Warm and dry NEUROLOGIC:  Alert and oriented x 3 PSYCHIATRIC:  Normal affect   ASSESSMENT:    1. Paroxysmal SVT (supraventricular tachycardia) (HCC)   2. Coronary artery disease involving native coronary artery of native heart without angina pectoris   3. Murmur   4.  Essential hypertension   5. Hyperlipidemia, mixed    PLAN:    In order of problems  listed above:  SVT Patient denies any further palpitations.  Patient has occasional lightheadedness and dizziness.  Continue Coreg 12.5 mg twice daily  CAD Patient denies any chest pain.  He has shortness of breath on exertion, however he is very sedentary at baseline.  No further ischemic workup at this time.  Continue Coreg. I will check a CBC.  Murmur I will check an echo today.  Hypertension Blood pressure normal today.  Continue Coreg 12.5 mg twice daily and losartan 100 mg daily.  Hyperlipidemia LDL 137, HDL 26, total chol 213, TG 273. He is no longer taking Repatha injections. I will start Crestor 40mg  daily. Can re-check lipids at follow-up.   Disposition: Follow up in 4 month(s) with MD/APP     Signed, Nakari Bracknell David Stall, PA-C  01/03/2023 4:35 PM    [facility]

## 2023-01-04 LAB — CBC
Hematocrit: 44.2 % (ref 37.5–51.0)
Hemoglobin: 14.2 g/dL (ref 13.0–17.7)
MCH: 33.6 pg — ABNORMAL HIGH (ref 26.6–33.0)
MCHC: 32.1 g/dL (ref 31.5–35.7)
MCV: 105 fL — ABNORMAL HIGH (ref 79–97)
Platelets: 226 10*3/uL (ref 150–450)
RBC: 4.22 x10E6/uL (ref 4.14–5.80)
RDW: 14.9 % (ref 11.6–15.4)
WBC: 8.1 10*3/uL (ref 3.4–10.8)

## 2023-01-04 LAB — BASIC METABOLIC PANEL
BUN/Creatinine Ratio: 25 — ABNORMAL HIGH (ref 10–24)
BUN: 36 mg/dL — ABNORMAL HIGH (ref 8–27)
CO2: 21 mmol/L (ref 20–29)
Calcium: 10 mg/dL (ref 8.6–10.2)
Chloride: 101 mmol/L (ref 96–106)
Creatinine, Ser: 1.45 mg/dL — ABNORMAL HIGH (ref 0.76–1.27)
Glucose: 111 mg/dL — ABNORMAL HIGH (ref 70–99)
Potassium: 5.6 mmol/L — ABNORMAL HIGH (ref 3.5–5.2)
Sodium: 139 mmol/L (ref 134–144)
eGFR: 49 mL/min/{1.73_m2} — ABNORMAL LOW (ref >59)

## 2023-01-07 ENCOUNTER — Other Ambulatory Visit: Payer: Self-pay | Admitting: Physician Assistant

## 2023-01-07 ENCOUNTER — Other Ambulatory Visit: Payer: Self-pay

## 2023-01-07 DIAGNOSIS — Z79899 Other long term (current) drug therapy: Secondary | ICD-10-CM

## 2023-01-07 MED ORDER — CARVEDILOL 25 MG PO TABS: 25.0000 mg | ORAL_TABLET | Freq: Two times a day (BID) | ORAL | 3 refills | Status: DC

## 2023-01-07 MED ORDER — LOSARTAN POTASSIUM 50 MG PO TABS: 50.0000 mg | ORAL_TABLET | Freq: Every day | ORAL | 3 refills | Status: DC

## 2023-01-08 ENCOUNTER — Other Ambulatory Visit: Payer: Self-pay | Admitting: Physician Assistant

## 2023-01-17 ENCOUNTER — Telehealth: Payer: Self-pay | Admitting: Medical

## 2023-01-17 NOTE — Telephone Encounter (Signed)
Pt c/o medication issue:  1. Name of Medication:   rosuvastatin (CRESTOR) 40 MG tablet   2. How are you currently taking this medication (dosage and times per day)?   As prescribed  3. Are you having a reaction (difficulty breathing--STAT)?   Jaw gets numb  4. What is your medication issue?   Patient stated this medication is not working for him as it is making his jaw numb.  Patient wants to get alternate medication.

## 2023-01-17 NOTE — Telephone Encounter (Signed)
Patient states that he started taking Rosuvastatin a few weeks ago. Patient reports that since starting Rosuvastatin he has developed jaw pain. Patient requesting an alternative medication. Will forward to provider.

## 2023-01-21 NOTE — Addendum Note (Signed)
Addended by: Jani Gravel on: 01/21/2023 10:11 AM   Modules accepted: Orders

## 2023-01-21 NOTE — Telephone Encounter (Signed)
Called patient and notified him of the following from [facility] [place], [place].  We an try Lipitor 80mg  daily, if he hasn't tried this   Patient has a documented allergy to Lipitor. Patient does not recall what reaction he had to Lipitor. Will forward to provider for further recommendations.

## 2023-01-24 ENCOUNTER — Other Ambulatory Visit: Payer: Self-pay | Admitting: Physician Assistant

## 2023-01-24 DIAGNOSIS — M1A09X0 Idiopathic chronic gout, multiple sites, without tophus (tophi): Secondary | ICD-10-CM

## 2023-01-24 MED ORDER — EZETIMIBE 10 MG PO TABS: 10.0000 mg | ORAL_TABLET | Freq: Every day | ORAL | 3 refills | Status: DC

## 2023-01-24 NOTE — Addendum Note (Signed)
Addended by: Jani Gravel on: 01/24/2023 08:14 AM   Modules accepted: Orders

## 2023-01-24 NOTE — Telephone Encounter (Signed)
Requested medications are due for refill today.  yes  Requested medications are on the active medications list.  yes  Last refill. Both 06/12/2022 6 month supply.  Future visit scheduled.   Yes at [facility]  Notes to clinic.  Daryel November listed as PCP.    Requested Prescriptions  Pending Prescriptions Disp Refills   allopurinol (ZYLOPRIM) 300 MG tablet [Pharmacy Med Name: Allopurinol 300 MG Oral Tablet] 90 tablet 0    Sig: Take 1 tablet by mouth once daily     There is no refill protocol information for this order     DULoxetine (CYMBALTA) 30 MG capsule [Pharmacy Med Name: DULoxetine HCl 30 MG Oral Capsule Delayed Release Particles] 180 capsule 0    Sig: Take 1 capsule by mouth twice daily     There is no refill protocol information for this order

## 2023-01-24 NOTE — Telephone Encounter (Signed)
Called patient and notified him of the following from [facility]ence [place], [place].  We can start Zetia 10 mg daily.   Patient verbalizes understanding. Prescription sent to preferred pharmacy.

## 2023-01-29 ENCOUNTER — Ambulatory Visit: Payer: 59 | Attending: Medical

## 2023-02-15 ENCOUNTER — Ambulatory Visit: Payer: 59

## 2023-02-28 ENCOUNTER — Ambulatory Visit: Payer: 59 | Attending: Medical

## 2023-02-28 DIAGNOSIS — R011 Cardiac murmur, unspecified: Secondary | ICD-10-CM | POA: Diagnosis not present

## 2023-02-28 LAB — ECHOCARDIOGRAM COMPLETE
Area-P 1/2: 2.83 cm2
S' Lateral: 2.4 cm

## 2023-02-28 MED ORDER — PERFLUTREN LIPID MICROSPHERE
1.0000 mL | INTRAVENOUS | Status: AC | PRN
  Administered 2023-02-28: 3 mL via INTRAVENOUS

## 2023-03-06 ENCOUNTER — Other Ambulatory Visit: Payer: Self-pay | Admitting: Physician Assistant

## 2023-03-06 NOTE — Telephone Encounter (Signed)
Daryel November listed as PCP.  Requested Prescriptions  Pending Prescriptions Disp Refills   DULoxetine (CYMBALTA) 30 MG capsule [Pharmacy Med Name: DULoxetine HCl 30 MG Oral Capsule Delayed Release Particles] 60 capsule 0    Sig: TAKE 1 CAPSULE BY MOUTH TWICE DAILY *NEED APPOINTMENT*     Psychiatry: Antidepressants - SNRI - duloxetine Failed - 03/06/2023  3:26 PM      Failed - Cr in normal range and within 360 days    Creatinine  Date Value Ref Range Status  09/02/2013 1.53 (H) 0.60 - 1.30 mg/dL Final   Creatinine, Ser  Date Value Ref Range Status  01/03/2023 1.45 (H) 0.76 - 1.27 mg/dL Final         Passed - eGFR is 30 or above and within 360 days    EGFR (African American)  Date Value Ref Range Status  09/02/2013 52 (L)  Final   GFR calc Af Amer  Date Value Ref Range Status  07/14/2019 49 (L) >60 mL/min Final   EGFR (Non-African Amer.)  Date Value Ref Range Status  09/02/2013 45 (L)  Final    Comment:    eGFR values <68mL/min/1.73 m2 may be an indication of chronic kidney disease (CKD). Calculated eGFR is useful in patients with stable renal function. The eGFR calculation will not be reliable in acutely ill patients when serum creatinine is changing rapidly. It is not useful in  patients on dialysis. The eGFR calculation may not be applicable to patients at the low and high extremes of body sizes, pregnant women, and vegetarians.    GFR calc non Af Amer  Date Value Ref Range Status  07/14/2019 42 (L) >60 mL/min Final   eGFR  Date Value Ref Range Status  01/03/2023 49 (L) >59 mL/min/1.73 Final         Passed - Completed PHQ-2 or PHQ-9 in the last 360 days      Passed - Last BP in normal range    BP Readings from Last 1 Encounters:  01/03/23 125/84         Passed - Valid encounter within last 6 months    Recent Outpatient Visits           4 months ago COVID-19   [facility] [place], [place], PA-C   9 months ago Hypothyroidism  (acquired)   [facility] Alfredia Ferguson, PA-C   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

## 2023-04-02 ENCOUNTER — Ambulatory Visit: Payer: 59 | Admitting: Podiatry

## 2023-05-03 ENCOUNTER — Ambulatory Visit: Payer: 59 | Attending: Medical | Admitting: Medical

## 2023-05-03 NOTE — Progress Notes (Deleted)
  Cardiology Office Note:  .   Date:  05/03/2023  ID:  Bobby Lane, DOB 02-24-1940, MRN [id number] PCP: Emily Filbert, MD  [place] [facility] Providers Cardiologist:  Meriam Sprague, MD (Inactive) { Click to update primary MD,subspecialty MD or APP then REFRESH:1}   History of Present Illness: Bobby Lane   Bobby Lane is a 83 y.o. male with a hx of COPD, CAD, gout, HTN and GERD who presents for 6 month follow-up.    The patient was seen 03/2020 for palpitations. Heart monitor showed 55 episodes of SVT, lonest lasting 13 seconds, no Afib. TTE 05/2020 showed hyperdynamic LVSF with intracavitary gradient, G1DD, no significant vale disease.  The patient was last seen 12/2022 and was overall doing Ok. He reported occasional SOB. Echo was ordered for murmur. Echo showed LVEF 60-65%, no wall motion abnormalities, moderate LVH, grade 1 diastolic dysfunction, aortic valve sclerosis.  Today,  Zetia and Lipitor  ROS: ***  Studies Reviewed: .        *** Risk Assessment/Calculations:   {Does this patient have ATRIAL FIBRILLATION?[phone]} No BP recorded.  {Refresh Note OR Click here to enter BP  :1}***       Physical Exam:   VS:  There were no vitals taken for this visit.   Wt Readings from Last 3 Encounters:  01/03/23 201 lb 11.5 oz (91.5 kg)  10/16/22 201 lb 11.2 oz (91.5 kg)  07/23/22 210 lb (95.3 kg)    GEN: Well nourished, well developed in no acute distress NECK: No JVD; No carotid bruits CARDIAC: ***RRR, no murmurs, rubs, gallops RESPIRATORY:  Clear to auscultation without rales, wheezing or rhonchi  ABDOMEN: Soft, non-tender, non-distended EXTREMITIES:  No edema; No deformity   ASSESSMENT AND PLAN: .   ***    {Are you ordering a CV Procedure (e.g. stress test, cath, DCCV, TEE, etc)?   Press F2        :[id number]}  Dispo: ***  Signed, Anyelina Claycomb David Stall, PA-C

## 2023-05-20 ENCOUNTER — Other Ambulatory Visit: Payer: Self-pay | Admitting: Physician Assistant

## 2023-05-20 ENCOUNTER — Ambulatory Visit: Admitting: Podiatry

## 2023-05-20 DIAGNOSIS — E039 Hypothyroidism, unspecified: Secondary | ICD-10-CM

## 2023-05-28 ENCOUNTER — Encounter: Payer: Self-pay | Admitting: Podiatry

## 2023-05-28 ENCOUNTER — Ambulatory Visit: Admitting: Podiatry

## 2023-05-28 DIAGNOSIS — M79674 Pain in right toe(s): Secondary | ICD-10-CM

## 2023-05-28 DIAGNOSIS — B351 Tinea unguium: Secondary | ICD-10-CM

## 2023-05-28 DIAGNOSIS — M79675 Pain in left toe(s): Secondary | ICD-10-CM

## 2023-05-28 NOTE — Progress Notes (Signed)
 Subjective:  Patient ID: Bobby Lane, male    DOB: 21-Mar-1940,  MRN: [id number]  MAREON Lane presents to clinic today for:  Chief Complaint  Patient presents with   Debridement    Requesting toenail trim    Patient notes nails are thick, discolored, elongated and painful in shoegear when trying to ambulate.  He had previous PNA procedures to the left hallux bilateral borders and right hallux medial nail border.  He notes some regrowth from the left hallux medial border, but not causing symptoms.  He notes he has not been here in awhile and wants them cut "as short as possible".  PCP is Emily Filbert, MD.  Past Medical History:  Diagnosis Date   Allergy    Arthritis    Cancer (HCC)    SKIN   COPD (chronic obstructive pulmonary disease) (HCC)    Coronary artery disease    Depression    Dyspnea    DOE   GERD (gastroesophageal reflux disease)    Gout    Heart murmur    History of hiatal hernia    History of kidney stones    History of wheezing    HOH (hard of hearing)    Hypertension    Hypothyroidism    Oxygen deficit    PRN USE; used ~ 2014 for less than a year (no longer has as of 07/15/19)   Pneumonia    Renal disorder    Thyroid disease     Past Surgical History:  Procedure Laterality Date   ANTERIOR CERVICAL DECOMP/DISCECTOMY FUSION N/A 07/16/2019   Procedure: Cervical three-four Cervical five-six Cervical six-seven Anterior cervical decompression/discectomy/fusion;  Surgeon: Maeola Harman, MD;  Location: [facility] OR;  Service: Neurosurgery;  Laterality: N/A;  3C   CARDIAC CATHETERIZATION     CARPAL TUNNEL RELEASE Left 07/16/2019   Procedure: LEFT CARPAL TUNNEL RELEASE;  Surgeon: Maeola Harman, MD;  Location: [facility] OR;  Service: Neurosurgery;  Laterality: Left;   CATARACT EXTRACTION W/PHACO Right 08/21/2016   Procedure: CATARACT EXTRACTION PHACO AND INTRAOCULAR LENS PLACEMENT (IOC);  Surgeon: Galen Manila, MD;  Location: [facility] ORS;  Service: Ophthalmology;   Laterality: Right;  [place] 00:54.9 AP% 18.3 CDE 10.04 Fluid pack lot # [id number]   CATARACT EXTRACTION W/PHACO Left 09/18/2016   Procedure: CATARACT EXTRACTION PHACO AND INTRAOCULAR LENS PLACEMENT (IOC);  Surgeon: Galen Manila, MD;  Location: [facility] ORS;  Service: Ophthalmology;  Laterality: Left;  [place] 00:56 AP% 19.7 CDE 11.07 Fluid pack lot # [id number]   FRACTURE SURGERY     ANKLE   HERNIA REPAIR     UPPER GI ENDOSCOPY      Allergies  Allergen Reactions   Codeine Nausea And Vomiting   Morphine And Codeine Nausea And Vomiting   Novocain [Procaine] Hives    NO TROUBLE IN OR   Other Nausea And Vomiting   Percocet [Oxycodone-Acetaminophen] Nausea And Vomiting   Sulfa Antibiotics Hives   Acetaminophen    Atorvastatin    Rosuvastatin    Tramadol Nausea And Vomiting    Note: upset stomach Note: upset stomach     Review of Systems: Negative except as noted in the HPI.  Objective:  Bobby Lane is a pleasant 83 y.o. male in NAD. AAO x 3.  Vascular Examination: Capillary refill time is 3-5 seconds to toes bilateral. Palpable pedal pulses b/l LE. Digital hair present b/l.  Skin temperature gradient WNL b/l. No varicosities b/l. No cyanosis noted b/l.   Dermatological Examination: Pedal skin  with normal turgor, texture and tone b/l. No open wounds. No interdigital macerations b/l. Toenails x10 are 3mm thick, discolored, dystrophic with subungual debris. There is pain with compression of the nail plates.  They are elongated x10.  Small nail spicule noted left hallux medial nail border near proximal nail fold.     Latest Ref Rng & Units 06/07/2022    3:56 PM  Hemoglobin A1C  Hemoglobin-A1c 4.8 - 5.6 % 5.3    Assessment/Plan: 1. Pain due to onychomycosis of toenails of both feet    The mycotic toenails were sharply debrided x10 with sterile nail nippers and a power debriding burr to decrease bulk/thickness and length.    F/u 3 months   Adolf Ormiston DEstle Hemp, DPM, FACFAS [facility]     [address] [place], [place] [address]                Office [phone]  Fax [phone]

## 2023-08-03 ENCOUNTER — Other Ambulatory Visit: Payer: Self-pay | Admitting: Physician Assistant

## 2023-08-03 DIAGNOSIS — E039 Hypothyroidism, unspecified: Secondary | ICD-10-CM

## 2023-08-08 ENCOUNTER — Other Ambulatory Visit: Payer: Self-pay | Admitting: Physician Assistant

## 2023-08-08 DIAGNOSIS — E039 Hypothyroidism, unspecified: Secondary | ICD-10-CM

## 2023-08-27 ENCOUNTER — Ambulatory Visit: Admitting: Podiatry

## 2023-09-13 ENCOUNTER — Inpatient Hospital Stay
Admission: EM | Admit: 2023-09-13 | Discharge: 2023-09-16 | DRG: 605 | Disposition: A | Discharge status: Home Health | Attending: Internal Medicine | Admitting: Internal Medicine

## 2023-09-13 ENCOUNTER — Emergency Department

## 2023-09-13 ENCOUNTER — Encounter: Payer: Self-pay | Admitting: Pulmonary Disease

## 2023-09-13 ENCOUNTER — Other Ambulatory Visit: Payer: Self-pay

## 2023-09-13 DIAGNOSIS — E669 Obesity, unspecified: Secondary | ICD-10-CM

## 2023-09-13 DIAGNOSIS — I2510 Atherosclerotic heart disease of native coronary artery without angina pectoris: Secondary | ICD-10-CM | POA: Diagnosis present

## 2023-09-13 DIAGNOSIS — Z4789 Encounter for other orthopedic aftercare: Secondary | ICD-10-CM | POA: Diagnosis not present

## 2023-09-13 DIAGNOSIS — Z6836 Body mass index (BMI) 36.0-36.9, adult: Secondary | ICD-10-CM | POA: Diagnosis not present

## 2023-09-13 DIAGNOSIS — E872 Acidosis, unspecified: Secondary | ICD-10-CM | POA: Diagnosis not present

## 2023-09-13 DIAGNOSIS — D72829 Elevated white blood cell count, unspecified: Secondary | ICD-10-CM | POA: Diagnosis not present

## 2023-09-13 DIAGNOSIS — Y92002 Bathroom of unspecified non-institutional (private) residence single-family (private) house as the place of occurrence of the external cause: Secondary | ICD-10-CM | POA: Diagnosis not present

## 2023-09-13 DIAGNOSIS — I5032 Chronic diastolic (congestive) heart failure: Secondary | ICD-10-CM | POA: Diagnosis present

## 2023-09-13 DIAGNOSIS — J449 Chronic obstructive pulmonary disease, unspecified: Secondary | ICD-10-CM | POA: Diagnosis present

## 2023-09-13 DIAGNOSIS — A419 Sepsis, unspecified organism: Secondary | ICD-10-CM | POA: Diagnosis not present

## 2023-09-13 DIAGNOSIS — N281 Cyst of kidney, acquired: Secondary | ICD-10-CM | POA: Diagnosis not present

## 2023-09-13 DIAGNOSIS — E039 Hypothyroidism, unspecified: Secondary | ICD-10-CM | POA: Diagnosis not present

## 2023-09-13 DIAGNOSIS — K529 Noninfective gastroenteritis and colitis, unspecified: Secondary | ICD-10-CM

## 2023-09-13 DIAGNOSIS — N179 Acute kidney failure, unspecified: Secondary | ICD-10-CM | POA: Diagnosis not present

## 2023-09-13 DIAGNOSIS — I10 Essential (primary) hypertension: Secondary | ICD-10-CM | POA: Diagnosis not present

## 2023-09-13 DIAGNOSIS — R059 Cough, unspecified: Secondary | ICD-10-CM

## 2023-09-13 DIAGNOSIS — R296 Repeated falls: Secondary | ICD-10-CM | POA: Diagnosis present

## 2023-09-13 DIAGNOSIS — Z886 Allergy status to analgesic agent status: Secondary | ICD-10-CM | POA: Diagnosis not present

## 2023-09-13 DIAGNOSIS — T148XXA Other injury of unspecified body region, initial encounter: Secondary | ICD-10-CM

## 2023-09-13 DIAGNOSIS — R1110 Vomiting, unspecified: Secondary | ICD-10-CM | POA: Diagnosis not present

## 2023-09-13 DIAGNOSIS — S301XXA Contusion of abdominal wall, initial encounter: Principal | ICD-10-CM | POA: Diagnosis present

## 2023-09-13 DIAGNOSIS — E538 Deficiency of other specified B group vitamins: Secondary | ICD-10-CM | POA: Diagnosis not present

## 2023-09-13 DIAGNOSIS — I130 Hypertensive heart and chronic kidney disease with heart failure and stage 1 through stage 4 chronic kidney disease, or unspecified chronic kidney disease: Secondary | ICD-10-CM | POA: Diagnosis not present

## 2023-09-13 DIAGNOSIS — Z79899 Other long term (current) drug therapy: Secondary | ICD-10-CM | POA: Diagnosis not present

## 2023-09-13 DIAGNOSIS — N189 Chronic kidney disease, unspecified: Secondary | ICD-10-CM | POA: Diagnosis not present

## 2023-09-13 DIAGNOSIS — N1831 Chronic kidney disease, stage 3a: Secondary | ICD-10-CM | POA: Diagnosis not present

## 2023-09-13 DIAGNOSIS — Z884 Allergy status to anesthetic agent status: Secondary | ICD-10-CM | POA: Diagnosis not present

## 2023-09-13 DIAGNOSIS — R578 Other shock: Secondary | ICD-10-CM | POA: Diagnosis not present

## 2023-09-13 DIAGNOSIS — I959 Hypotension, unspecified: Secondary | ICD-10-CM | POA: Diagnosis not present

## 2023-09-13 DIAGNOSIS — R7989 Other specified abnormal findings of blood chemistry: Secondary | ICD-10-CM | POA: Diagnosis not present

## 2023-09-13 DIAGNOSIS — E66812 Obesity, class 2: Secondary | ICD-10-CM | POA: Diagnosis present

## 2023-09-13 DIAGNOSIS — E785 Hyperlipidemia, unspecified: Secondary | ICD-10-CM | POA: Diagnosis present

## 2023-09-13 DIAGNOSIS — Z882 Allergy status to sulfonamides status: Secondary | ICD-10-CM

## 2023-09-13 DIAGNOSIS — W1811XA Fall from or off toilet without subsequent striking against object, initial encounter: Secondary | ICD-10-CM | POA: Diagnosis present

## 2023-09-13 DIAGNOSIS — R1084 Generalized abdominal pain: Secondary | ICD-10-CM | POA: Diagnosis not present

## 2023-09-13 DIAGNOSIS — Z885 Allergy status to narcotic agent status: Secondary | ICD-10-CM

## 2023-09-13 DIAGNOSIS — R197 Diarrhea, unspecified: Secondary | ICD-10-CM | POA: Diagnosis not present

## 2023-09-13 DIAGNOSIS — U071 COVID-19: Secondary | ICD-10-CM

## 2023-09-13 DIAGNOSIS — R579 Shock, unspecified: Secondary | ICD-10-CM | POA: Diagnosis not present

## 2023-09-13 DIAGNOSIS — R051 Acute cough: Secondary | ICD-10-CM

## 2023-09-13 DIAGNOSIS — R001 Bradycardia, unspecified: Secondary | ICD-10-CM | POA: Diagnosis not present

## 2023-09-13 DIAGNOSIS — K4090 Unilateral inguinal hernia, without obstruction or gangrene, not specified as recurrent: Secondary | ICD-10-CM | POA: Diagnosis not present

## 2023-09-13 LAB — PROTIME-INR
INR: 1.1 (ref 0.8–1.2)
Prothrombin Time: 15 s (ref 11.4–15.2)

## 2023-09-13 LAB — CBC WITH DIFFERENTIAL/PLATELET
Abs Immature Granulocytes: 0.34 K/uL — ABNORMAL HIGH (ref 0.00–0.07)
Basophils Absolute: 0 K/uL (ref 0.0–0.1)
Basophils Relative: 0 %
Eosinophils Absolute: 0.1 K/uL (ref 0.0–0.5)
Eosinophils Relative: 0 %
HCT: 38.6 % — ABNORMAL LOW (ref 39.0–52.0)
Hemoglobin: 12.8 g/dL — ABNORMAL LOW (ref 13.0–17.0)
Immature Granulocytes: 2 %
Lymphocytes Relative: 17 %
Lymphs Abs: 2.6 K/uL (ref 0.7–4.0)
MCH: 34.2 pg — ABNORMAL HIGH (ref 26.0–34.0)
MCHC: 33.2 g/dL (ref 30.0–36.0)
MCV: 103.2 fL — ABNORMAL HIGH (ref 80.0–100.0)
Monocytes Absolute: 1.2 K/uL — ABNORMAL HIGH (ref 0.1–1.0)
Monocytes Relative: 8 %
Neutro Abs: 11.3 K/uL — ABNORMAL HIGH (ref 1.7–7.7)
Neutrophils Relative %: 73 %
Platelets: 249 K/uL (ref 150–400)
RBC: 3.74 MIL/uL — ABNORMAL LOW (ref 4.22–5.81)
RDW: 15.5 % (ref 11.5–15.5)
WBC: 15.4 K/uL — ABNORMAL HIGH (ref 4.0–10.5)
nRBC: 0.3 % — ABNORMAL HIGH (ref 0.0–0.2)

## 2023-09-13 LAB — URINALYSIS, W/ REFLEX TO CULTURE (INFECTION SUSPECTED)
Bacteria, UA: NONE SEEN
Bilirubin Urine: NEGATIVE
Glucose, UA: NEGATIVE mg/dL
Hgb urine dipstick: NEGATIVE
Ketones, ur: NEGATIVE mg/dL
Leukocytes,Ua: NEGATIVE
Nitrite: NEGATIVE
Protein, ur: NEGATIVE mg/dL
Specific Gravity, Urine: >1.046 — ABNORMAL HIGH (ref 1.005–1.030)
pH: 5 (ref 5.0–8.0)

## 2023-09-13 LAB — COMPREHENSIVE METABOLIC PANEL WITH GFR
ALT: 78 U/L — ABNORMAL HIGH (ref 0–44)
AST: 41 U/L (ref 15–41)
Albumin: 3.4 g/dL — ABNORMAL LOW (ref 3.5–5.0)
Alkaline Phosphatase: 42 U/L (ref 38–126)
Anion gap: 12 (ref 5–15)
BUN: 37 mg/dL — ABNORMAL HIGH (ref 8–23)
CO2: 22 mmol/L (ref 22–32)
Calcium: 8.8 mg/dL — ABNORMAL LOW (ref 8.9–10.3)
Chloride: 103 mmol/L (ref 98–111)
Creatinine, Ser: 1.94 mg/dL — ABNORMAL HIGH (ref 0.61–1.24)
GFR, Estimated: 34 mL/min — ABNORMAL LOW (ref >60)
Glucose, Bld: 164 mg/dL — ABNORMAL HIGH (ref 70–99)
Potassium: 4.5 mmol/L (ref 3.5–5.1)
Sodium: 137 mmol/L (ref 135–145)
Total Bilirubin: 1.9 mg/dL — ABNORMAL HIGH (ref 0.0–1.2)
Total Protein: 6.4 g/dL — ABNORMAL LOW (ref 6.5–8.1)

## 2023-09-13 LAB — TROPONIN I (HIGH SENSITIVITY)
Troponin I (High Sensitivity): 67 ng/L — ABNORMAL HIGH (ref <18)
Troponin I (High Sensitivity): 42 ng/L — ABNORMAL HIGH (ref <18)
Troponin I (High Sensitivity): 41 ng/L — ABNORMAL HIGH (ref <18)

## 2023-09-13 LAB — LIPASE, BLOOD: Lipase: 25 U/L (ref 11–51)

## 2023-09-13 LAB — GLUCOSE, CAPILLARY: Glucose-Capillary: 137 mg/dL — ABNORMAL HIGH (ref 70–99)

## 2023-09-13 LAB — MRSA NEXT GEN BY PCR, NASAL: MRSA by PCR Next Gen: NOT DETECTED

## 2023-09-13 LAB — CK: Total CK: 79 U/L (ref 49–397)

## 2023-09-13 LAB — LACTIC ACID, PLASMA
Lactic Acid, Venous: 3 mmol/L (ref 0.5–1.9)
Lactic Acid, Venous: 2 mmol/L (ref 0.5–1.9)
Lactic Acid, Venous: 2.1 mmol/L (ref 0.5–1.9)
Lactic Acid, Venous: 1.4 mmol/L (ref 0.5–1.9)

## 2023-09-13 LAB — MAGNESIUM: Magnesium: 2.3 mg/dL (ref 1.7–2.4)

## 2023-09-13 LAB — APTT: aPTT: 30 s (ref 24–36)

## 2023-09-13 LAB — BRAIN NATRIURETIC PEPTIDE: B Natriuretic Peptide: 184.4 pg/mL — ABNORMAL HIGH (ref 0.0–100.0)

## 2023-09-13 MED ORDER — NOREPINEPHRINE 4 MG/250ML-% IV SOLN
INTRAVENOUS | Status: AC
  Administered 2023-09-13: 5 ug/min via INTRAVENOUS
  Filled 2023-09-13: qty 250

## 2023-09-13 MED ORDER — VANCOMYCIN HCL IN DEXTROSE 1-5 GM/200ML-% IV SOLN
1000.0000 mg | Freq: Once | INTRAVENOUS | Status: AC
  Administered 2023-09-13: 1000 mg via INTRAVENOUS
  Filled 2023-09-13: qty 200

## 2023-09-13 MED ORDER — LACTATED RINGERS IV SOLN: INTRAVENOUS | Status: DC

## 2023-09-13 MED ORDER — LEVOTHYROXINE SODIUM 125 MCG PO TABS
125.0000 ug | ORAL_TABLET | Freq: Every day | ORAL | Status: DC
  Administered 2023-09-15 – 2023-09-16 (×2): 125 ug via ORAL
  Filled 2023-09-13 (×3): qty 1

## 2023-09-13 MED ORDER — LACTATED RINGERS IV BOLUS
1000.0000 mL | Freq: Once | INTRAVENOUS | Status: AC
  Administered 2023-09-13: 1000 mL via INTRAVENOUS

## 2023-09-13 MED ORDER — IOHEXOL 350 MG/ML SOLN
75.0000 mL | Freq: Once | INTRAVENOUS | Status: AC | PRN
  Administered 2023-09-13: 75 mL via INTRAVENOUS

## 2023-09-13 MED ORDER — ALLOPURINOL 100 MG PO TABS
300.0000 mg | ORAL_TABLET | Freq: Every day | ORAL | Status: DC
  Administered 2023-09-13 – 2023-09-16 (×4): 300 mg via ORAL
  Filled 2023-09-13: qty 1
  Filled 2023-09-13: qty 3
  Filled 2023-09-13: qty 1
  Filled 2023-09-13: qty 3

## 2023-09-13 MED ORDER — SODIUM CHLORIDE 0.9 % IV SOLN
2.0000 g | INTRAVENOUS | Status: DC
  Administered 2023-09-13 – 2023-09-15 (×4): 2 g via INTRAVENOUS
  Filled 2023-09-13 (×4): qty 20

## 2023-09-13 MED ORDER — METRONIDAZOLE 500 MG/100ML IV SOLN
500.0000 mg | Freq: Once | INTRAVENOUS | Status: AC
  Administered 2023-09-13: 500 mg via INTRAVENOUS
  Filled 2023-09-13: qty 100

## 2023-09-13 MED ORDER — CHLORHEXIDINE GLUCONATE CLOTH 2 % EX PADS
6.0000 | MEDICATED_PAD | Freq: Every day | CUTANEOUS | Status: DC
  Administered 2023-09-13 – 2023-09-16 (×3): 6 via TOPICAL
  Filled 2023-09-13: qty 6

## 2023-09-13 MED ORDER — IPRATROPIUM-ALBUTEROL 0.5-2.5 (3) MG/3ML IN SOLN
3.0000 mL | Freq: Four times a day (QID) | RESPIRATORY_TRACT | Status: DC
  Administered 2023-09-13 – 2023-09-14 (×3): 3 mL via RESPIRATORY_TRACT
  Filled 2023-09-13 (×3): qty 3

## 2023-09-13 MED ORDER — METRONIDAZOLE 500 MG/100ML IV SOLN
500.0000 mg | Freq: Three times a day (TID) | INTRAVENOUS | Status: DC
  Administered 2023-09-13 – 2023-09-14 (×2): 500 mg via INTRAVENOUS
  Filled 2023-09-13 (×3): qty 100

## 2023-09-13 MED ORDER — NOREPINEPHRINE 4 MG/250ML-% IV SOLN: 0.0000 ug/min | INTRAVENOUS | Status: DC

## 2023-09-13 MED ORDER — DOCUSATE SODIUM 100 MG PO CAPS: 100.0000 mg | ORAL_CAPSULE | Freq: Two times a day (BID) | ORAL | Status: DC | PRN

## 2023-09-13 MED ORDER — LACTATED RINGERS IV SOLN: INTRAVENOUS | Status: AC

## 2023-09-13 MED ORDER — EZETIMIBE 10 MG PO TABS
10.0000 mg | ORAL_TABLET | Freq: Every day | ORAL | Status: DC
  Administered 2023-09-13 – 2023-09-16 (×4): 10 mg via ORAL
  Filled 2023-09-13 (×4): qty 1

## 2023-09-13 MED ORDER — SODIUM CHLORIDE 0.9 % IV SOLN
2.0000 g | Freq: Once | INTRAVENOUS | Status: AC
  Administered 2023-09-13: 2 g via INTRAVENOUS
  Filled 2023-09-13: qty 12.5

## 2023-09-13 MED ORDER — DULOXETINE HCL 30 MG PO CPEP
30.0000 mg | ORAL_CAPSULE | Freq: Every day | ORAL | Status: DC
  Administered 2023-09-13 – 2023-09-16 (×4): 30 mg via ORAL
  Filled 2023-09-13 (×4): qty 1

## 2023-09-13 MED ORDER — LACTATED RINGERS IV BOLUS
500.0000 mL | Freq: Once | INTRAVENOUS | Status: AC
  Administered 2023-09-13: 500 mL via INTRAVENOUS

## 2023-09-13 MED ORDER — POLYETHYLENE GLYCOL 3350 17 G PO PACK: 17.0000 g | PACK | Freq: Every day | ORAL | Status: DC | PRN

## 2023-09-13 NOTE — Plan of Care (Signed)
   Problem: Education: Goal: Knowledge of General Education information will improve Description: Including pain rating scale, medication(s)/side effects and non-pharmacologic comfort measures Outcome: Progressing   Problem: Safety: Goal: Ability to remain free from injury will improve Outcome: Progressing

## 2023-09-13 NOTE — ED Notes (Signed)
 When speaking with Pt, he stated he took 100mg  of losartan  this AM, even though he is prescribed 50mg  of losartan . Pt reported his BP was systolic 180s before administering additional drugs

## 2023-09-13 NOTE — Sepsis Progress Note (Addendum)
 Elink following code sepsis  1606 Notified provider of need to order fluid bolus.   1607 Notified bedside nurse of need to draw repeat lactic acid.

## 2023-09-13 NOTE — Progress Notes (Signed)
 CODE SEPSIS - PHARMACY COMMUNICATION  **Broad Spectrum Antibiotics should be administered within 1 hour of Sepsis diagnosis**  Time Code Sepsis Called/Page Received: 1320  Antibiotics Ordered: Cefepime  & vancomycin  & metronidazole   Time of 1st antibiotic administration: 1349  Additional action taken by pharmacy: N/A  Bobby Lane 09/13/2023  1:36 PM

## 2023-09-13 NOTE — H&P (Addendum)
   NAME:  EDWEN MCLESTER, MRN:  [id number], DOB:  1940-06-23, LOS: 0 ADMISSION DATE:  09/13/2023 History of Present Illness:  Mr. Clardy is a pleasant 83 year old male patient with a past medical history of clinical diagnosis of COPD, CAD medically managed, hypertension, heart failure with preserved EF 60 to 65% echo with moderate left ventricular hypertrophy of the basal septal and inferior segment causing mild LVO obstruction presenting to [facility] on 08/01 from home for generalized weakness and feeling that his legs are numb.  He was found to be hypotensive in the ED with MAP in the 50s.  Nonresponsive to fluids therefore was started on norepinephrine.  Labs with leukocytosis with a white count of 15.4, hemoglobin slightly lower than baseline at 12.8 from a baseline of 14.2 mg/dL, hematocrit at 61.3% and platelets at 249.  Also noted to have an AKI with a creatinine of 1.94 mg/dL from a baseline of 1.4 to 1.5 mg/dL.  CTA chest 08/01 - Negative for large or central PE. No parenchymal lung disease.   CT A/p 08/01 - Right rectus hematoma measuring 4.9x13.2 cm. No active extravastion.   Given undifferentiated shock, blood cultures were obtained and he was given broad-spectrum antibiotics with Vanco cefepime  and Flagyl .  Pertinent  Medical History  As above  Significant Hospital Events: Including procedures, antibiotic start and stop dates in addition to other pertinent events   09/13/2023 - Admitted to the ICU for pressor support  Objective    Blood pressure (!) 107/57, pulse 61, temperature (!) 95.4 F (35.2 C), temperature source Rectal, resp. rate 17, height 5' 5 (1.651 m), weight 100.2 kg, SpO2 97%.       No intake or output data in the 24 hours ending 09/13/23 1643 Filed Weights   09/13/23 1331  Weight: 100.2 kg    Examination: General: NAD, talkative and coherent HENT: Supple neck, reactive pupils Lungs: Clear bilateral air entry Cardiovascular: Normal S1, Normal S2, RRR Abdomen:  Tender of the right mid and lower abdomen. Hematoma palpable.  Extremities: Warm and well perfused  Labs and imaging were reviewed.   Assessment and Plan  #shock - ddx includes Distributive with unclear source vs cardiogenic in the setting of Antihypertensive BB and AKI in the setting of gastroenteritis.  BP improved and is off pressors when arrived to the icu.  #Right rectus hematoma measuring 4.9x13.2 cm. Likely as a result of his multiple falls. He is not on any anticoagulation. I doubt this is drainable but Will discuss with our IR colleagues regarding drainage in the am.  #COPD #CAD medically managed  #Hypertension   Neuro: C/w home duloxetine . Delirium precautions.  CVS: NE for MAP > 65 (Currently off). Hold home antihypertensives and beta blockers.  Pulm: Duonebs Q6H scheduled.  GI: Clears and advance as tolerated.  Renal: Monitor UOP. Replete electrolytes as needed. CK. Daily Cr. Avoid nephrotox agents.  Heme: SCDs for prophylaxis. Hold Chemical px given rectus sheath hematoma. Discuss with IR in the am for rectus sheath hematoma drainage if at all drainable.  Endo: POC 140-180 ID: C/w Ceftriaxone  and flagyl  for now. Procal in the am.   Best Practice (right click and Reselect all SmartList Selections daily)   Diet/type: clear liquids and advance as tolerated DVT prophylaxis SCD Pressure ulcer(s): N/A GI prophylaxis: N/A Lines: N/A Foley:  N/A Code Status:  full code  Critical care time: 60 minutes    Darrin Barn, MD [facility] 09/13/2023 5:06 PM

## 2023-09-13 NOTE — ED Triage Notes (Addendum)
 BIBEMS, coming from an independent living area. C/o numbness to bilateral legs. Pt was found on toilet by EMS, Pt was diaphoretic and cold with EMS. Reports diarrhea recently, dry heaving with EMS. BGL: 112. 12 lead SB. Hard of hearing. VSS GCS 15. 4mg  of zofran , IM R arm

## 2023-09-13 NOTE — Consult Note (Signed)
 PHARMACY CONSULT NOTE - ELECTROLYTES  Pharmacy Consult for Electrolyte Monitoring and Replacement   Recent Labs: Height: 5' 5 (165.1 cm) Weight: 100.2 kg (220 lb 14.4 oz) IBW/kg (Calculated) : 61.5 Estimated Creatinine Clearance: 32.5 mL/min (A) (by C-G formula based on SCr of 1.94 mg/dL (H)). Potassium (mmol/L)  Date Value  09/13/2023 4.5  09/02/2013 4.5   Magnesium (mg/dL)  Date Value  91/98/7974 2.3   Calcium  (mg/dL)  Date Value  91/98/7974 8.8 (L)   Calcium , Total (mg/dL)  Date Value  92/77/7984 9.9   Albumin  (g/dL)  Date Value  91/98/7974 3.4 (L)  06/07/2022 4.7  09/02/2013 4.0   Phosphorus (mg/dL)  Date Value  89/89/7979 2.2 (L)   Sodium (mmol/L)  Date Value  09/13/2023 137  01/03/2023 139  09/02/2013 141    Assessment  Bobby Lane is a 83 y.o. male presenting with Shock. PMH significant for COPD, CAD, gout, HTN and GERD . Pharmacy has been consulted to monitor and replace electrolytes.  Diet: NPO MIVF: LR @ 150 mL/hr Pertinent medications: n/a  Goal of Therapy: Electrolytes WNL  Plan:  No replacement indicated at this time Check BMP, Mg, Phos with AM labs  Thank you for allowing pharmacy to be a part of this patient's care.  Kayla JULIANNA Blew, PharmD Clinical Pharmacist 09/13/2023 3:46 PM

## 2023-09-13 NOTE — ED Provider Notes (Signed)
 [facility] Provider Note    Event Date/Time   First MD Initiated Contact with Patient 09/13/23 1241     (approximate)   History   Abdominal Pain   HPI  Bobby Lane is a 83 y.o. male past medical history significant for COPD, CAD, gout, hypertension, GERD, who presents to the emergency department following an episode of low blood pressure and altered mental status.  EMS was called out because patient was unable to get off the toilet and was having weakness and feeling like both of his legs were numb and weak.  Stated that the blood pressure was normal and route.  Patient states that he felt fine yesterday.  States that he is fallen but has fallen multiple times over the past year no recent falls.  States that he usually has to use a lift chair in order to get to the bathroom.  Denies any active chest pain at this time.  States that it feels like he has a tearing muscle in his abdomen and that it is radiating to his back.  States that it feels like he coughed and tore a muscle in his stomach.  Denies being on anticoagulation.  Denies any burning with urination.  Also states that he took extra blood pressure medication this morning  On arrival blood pressure significantly low at 60 systolic.  Multiple repeat blood pressure continues to read low at 60/40     Physical Exam   Triage Vital Signs: ED Triage Vitals  Encounter Vitals Group     BP      Girls Systolic BP Percentile      Girls Diastolic BP Percentile      Boys Systolic BP Percentile      Boys Diastolic BP Percentile      Pulse      Resp      Temp      Temp src      SpO2      Weight      Height      Head Circumference      Peak Flow      Pain Score      Pain Loc      Pain Education      Exclude from Growth Chart     Most recent vital signs: Vitals:   09/13/23 1545 09/13/23 1550  BP: 112/70 (!) 131/58  Pulse: (!) 46 68  Resp: (!) 22 (!) 0  Temp:    SpO2: 92% 92%    Physical  Exam Constitutional:      General: He is in acute distress.     Appearance: He is well-developed. He is ill-appearing, toxic-appearing and diaphoretic.  HENT:     Head: Atraumatic.  Eyes:     Conjunctiva/sclera: Conjunctivae normal.  Cardiovascular:     Rate and Rhythm: Regular rhythm.  Pulmonary:     Effort: No respiratory distress.  Abdominal:     General: There is distension.     Tenderness: There is abdominal tenderness in the right upper quadrant and right lower quadrant.  Musculoskeletal:     Cervical back: Normal range of motion.  Skin:    General: Skin is warm.     Capillary Refill: Capillary refill takes 2 to 3 seconds.  Neurological:     General: No focal deficit present.     Mental Status: He is alert. Mental status is at baseline.     Comments: Cranial nerves intact.  Pulses are equal  and symmetric.  Able to move both lower extremities that are equal     IMPRESSION / MDM / ASSESSMENT AND PLAN / ED COURSE  I reviewed the triage vital signs and the nursing notes.  On arrival blood pressure significantly low at 70/40, multiple repeat blood pressures reading 60/40.  Bradycardic with a heart rate in the 50s.  Blood cultures obtained.  Given 1 L of IV fluid bolus.  Continued to be significantly hypotensive with a blood pressure of 70/40.  Felt that 30 cc/kg of IV fluids may be detrimental given history of heart failure, will give 1 L of IV fluids and reevaluate.  Started on peripheral Levophed given ongoing hypotension.  Patient later stated that he had taken extra antihypertensive medications, possible polypharmacy.  Given a second liter of IV fluids with improvement of blood pressure.  Differential diagnosis including dissection, ACS, dysrhythmia, polypharmacy, AAA, sepsis.    EKG  I, Clotilda Punter, the attending physician, personally viewed and interpreted this ECG.  EKG showed sinus bradycardia with a heart rate of 53.  No signs of heart block.  1 PVC.  Mild ST  elevation to the inferior leads but does not meet STEMI criteria, no ST depression.  T waves inverted to lateral leads.  Repeat EKG obtained without acute findings, no findings of ST elevation to the inferior leads  Sinus bradycardia while on cardiac telemetry.  RADIOLOGY Discussed with radiologist -CTA dissection study evaluated to further evaluate for dissection or AAA.  No findings of aortic issues but did show a rectal sheath hematoma that was large.  Did not see any active extravasation.   LABS (all labs ordered are listed, but only abnormal results are displayed) Labs interpreted as -    Labs Reviewed  LACTIC ACID, PLASMA - Abnormal; Notable for the following components:      Result Value   Lactic Acid, Venous 3.0 (*)    All other components within normal limits  COMPREHENSIVE METABOLIC PANEL WITH GFR - Abnormal; Notable for the following components:   Glucose, Bld 164 (*)    BUN 37 (*)    Creatinine, Ser 1.94 (*)    Calcium  8.8 (*)    Total Protein 6.4 (*)    Albumin  3.4 (*)    ALT 78 (*)    Total Bilirubin 1.9 (*)    GFR, Estimated 34 (*)    All other components within normal limits  CBC WITH DIFFERENTIAL/PLATELET - Abnormal; Notable for the following components:   WBC 15.4 (*)    RBC 3.74 (*)    Hemoglobin 12.8 (*)    HCT 38.6 (*)    MCV 103.2 (*)    MCH 34.2 (*)    nRBC 0.3 (*)    Neutro Abs 11.3 (*)    Monocytes Absolute 1.2 (*)    Abs Immature Granulocytes 0.34 (*)    All other components within normal limits  BRAIN NATRIURETIC PEPTIDE - Abnormal; Notable for the following components:   B Natriuretic Peptide 184.4 (*)    All other components within normal limits  TROPONIN I (HIGH SENSITIVITY) - Abnormal; Notable for the following components:   Troponin I (High Sensitivity) 67 (*)    All other components within normal limits  CULTURE, BLOOD (ROUTINE X 2)  CULTURE, BLOOD (ROUTINE X 2)  MRSA NEXT GEN BY PCR, NASAL  PROTIME-INR  LIPASE, BLOOD  APTT   MAGNESIUM  LACTIC ACID, PLASMA  URINALYSIS, W/ REFLEX TO CULTURE (INFECTION SUSPECTED)     MDM  Patient given 1 L of IV fluid bolus and continued to be significantly hypotensive so was started on peripheral Levophed given concern for polypharmacy from taking extra antihypertensive medications.  Given a second liter of IV fluids.  Found of leukocytosis of 15.4.  Anemia but hemoglobin is 12.8.  Normal platelets.  Creatinine appears to be close to his baseline at 1.94 with a baseline of 1.5.  BUN is 37.  Creatinine elevated at 3.0.  Blood cultures added on and started on antibiotics to cover unknown source given hypotension, leukocytosis and elevated lactic acid  CTA with findings of a rectal sheath hematoma, patient denies any falls and I do not see that the patient is on any anticoagulation.  Consulted ICU, patient admitted to the ICU for hypotension requiring Levophed.   PROCEDURES:  Critical Care performed: yes  .Critical Care  Performed by: Suzanne Kirsch, MD Authorized by: Suzanne Kirsch, MD   Critical care provider statement:    Critical care time (minutes):  45   Critical care time was exclusive of:  Separately billable procedures and treating other patients   Critical care was necessary to treat or prevent imminent or life-threatening deterioration of the following conditions:  Circulatory failure   Critical care was time spent personally by me on the following activities:  Development of treatment plan with patient or surrogate, discussions with consultants, evaluation of patient's response to treatment, examination of patient, ordering and review of laboratory studies, ordering and review of radiographic studies, ordering and performing treatments and interventions, pulse oximetry, re-evaluation of patient's condition and review of old charts   Patient's presentation is most consistent with acute presentation with potential threat to life or bodily  function.   MEDICATIONS ORDERED IN ED: Medications  norepinephrine (LEVOPHED) 4mg  in (0.016 mg/mL) premix infusion (0 mcg/min Intravenous Stopped 09/13/23 1606)  lactated ringers  infusion (has no administration in time range)  vancomycin  (VANCOCIN ) IVPB 1000 mg/200 mL premix (1,000 mg Intravenous New Bag/Given 09/13/23 1528)  Chlorhexidine  Gluconate Cloth 2 % PADS 6 each (has no administration in time range)  docusate sodium  (COLACE) capsule 100 mg (has no administration in time range)  polyethylene glycol (MIRALAX  / GLYCOLAX ) packet 17 g (has no administration in time range)  lactated ringers  bolus 1,000 mL (0 mLs Intravenous Stopped 09/13/23 1359)  ceFEPIme  (MAXIPIME ) 2 g in sodium chloride  0.9 % 100 mL IVPB (0 g Intravenous Stopped 09/13/23 1404)  metroNIDAZOLE  (FLAGYL ) IVPB 500 mg (0 mg Intravenous Stopped 09/13/23 1528)  lactated ringers  bolus 1,000 mL (1,000 mLs Intravenous New Bag/Given 09/13/23 1530)  iohexol  (OMNIPAQUE ) 350 MG/ML injection 75 mL (75 mLs Intravenous Contrast Given 09/13/23 1456)    FINAL CLINICAL IMPRESSION(S) / ED DIAGNOSES   Final diagnoses:  Generalized abdominal pain  Hematoma  Hypotension, unspecified hypotension type     Rx / DC Orders   ED Discharge Orders     None        Note:  This document was prepared using Dragon voice recognition software and may include unintentional dictation errors.   Suzanne Kirsch, MD 09/13/23 [id number]

## 2023-09-14 ENCOUNTER — Inpatient Hospital Stay (HOSPITAL_COMMUNITY)
Admit: 2023-09-14 | Discharge: 2023-09-14 | Disposition: A | Discharge status: Home / Self Care | Attending: Pulmonary Disease | Admitting: Pulmonary Disease

## 2023-09-14 DIAGNOSIS — E872 Acidosis, unspecified: Secondary | ICD-10-CM | POA: Insufficient documentation

## 2023-09-14 DIAGNOSIS — E669 Obesity, unspecified: Secondary | ICD-10-CM

## 2023-09-14 DIAGNOSIS — R578 Other shock: Secondary | ICD-10-CM

## 2023-09-14 DIAGNOSIS — E538 Deficiency of other specified B group vitamins: Secondary | ICD-10-CM

## 2023-09-14 DIAGNOSIS — E785 Hyperlipidemia, unspecified: Secondary | ICD-10-CM | POA: Diagnosis not present

## 2023-09-14 DIAGNOSIS — R059 Cough, unspecified: Secondary | ICD-10-CM

## 2023-09-14 DIAGNOSIS — T148XXA Other injury of unspecified body region, initial encounter: Secondary | ICD-10-CM

## 2023-09-14 DIAGNOSIS — R7989 Other specified abnormal findings of blood chemistry: Secondary | ICD-10-CM | POA: Diagnosis not present

## 2023-09-14 DIAGNOSIS — N179 Acute kidney failure, unspecified: Secondary | ICD-10-CM

## 2023-09-14 DIAGNOSIS — N189 Chronic kidney disease, unspecified: Secondary | ICD-10-CM | POA: Insufficient documentation

## 2023-09-14 DIAGNOSIS — R579 Shock, unspecified: Secondary | ICD-10-CM | POA: Diagnosis not present

## 2023-09-14 LAB — COMPREHENSIVE METABOLIC PANEL WITH GFR
ALT: 55 U/L — ABNORMAL HIGH (ref 0–44)
AST: 29 U/L (ref 15–41)
Albumin: 2.6 g/dL — ABNORMAL LOW (ref 3.5–5.0)
Alkaline Phosphatase: 34 U/L — ABNORMAL LOW (ref 38–126)
Anion gap: 8 (ref 5–15)
BUN: 29 mg/dL — ABNORMAL HIGH (ref 8–23)
CO2: 24 mmol/L (ref 22–32)
Calcium: 8.1 mg/dL — ABNORMAL LOW (ref 8.9–10.3)
Chloride: 106 mmol/L (ref 98–111)
Creatinine, Ser: 1.25 mg/dL — ABNORMAL HIGH (ref 0.61–1.24)
GFR, Estimated: 58 mL/min — ABNORMAL LOW (ref >60)
Glucose, Bld: 85 mg/dL (ref 70–99)
Potassium: 3.9 mmol/L (ref 3.5–5.1)
Sodium: 138 mmol/L (ref 135–145)
Total Bilirubin: 1 mg/dL (ref 0.0–1.2)
Total Protein: 5 g/dL — ABNORMAL LOW (ref 6.5–8.1)

## 2023-09-14 LAB — CBC
HCT: 32.6 % — ABNORMAL LOW (ref 39.0–52.0)
Hemoglobin: 10.6 g/dL — ABNORMAL LOW (ref 13.0–17.0)
MCH: 33.3 pg (ref 26.0–34.0)
MCHC: 32.5 g/dL (ref 30.0–36.0)
MCV: 102.5 fL — ABNORMAL HIGH (ref 80.0–100.0)
Platelets: 191 K/uL (ref 150–400)
RBC: 3.18 MIL/uL — ABNORMAL LOW (ref 4.22–5.81)
RDW: 15.3 % (ref 11.5–15.5)
WBC: 10.4 K/uL (ref 4.0–10.5)
nRBC: 0 % (ref 0.0–0.2)

## 2023-09-14 LAB — PHOSPHORUS: Phosphorus: 2.5 mg/dL (ref 2.5–4.6)

## 2023-09-14 LAB — PROCALCITONIN: Procalcitonin: <0.1 ng/mL

## 2023-09-14 LAB — GLUCOSE, CAPILLARY: Glucose-Capillary: 91 mg/dL (ref 70–99)

## 2023-09-14 LAB — MAGNESIUM: Magnesium: 1.9 mg/dL (ref 1.7–2.4)

## 2023-09-14 LAB — ECHOCARDIOGRAM COMPLETE
AV Peak grad: 8.3 mmHg
Ao pk vel: 1.44 m/s
Area-P 1/2: 1.92 cm2
Height: 65 in
S' Lateral: 2 cm
Weight: 3442.7 [oz_av]

## 2023-09-14 MED ORDER — VITAMIN B-12 1000 MCG PO TABS
1000.0000 ug | ORAL_TABLET | Freq: Every day | ORAL | Status: DC
  Administered 2023-09-14 – 2023-09-15 (×2): 1000 ug via ORAL
  Filled 2023-09-14 (×2): qty 1

## 2023-09-14 MED ORDER — ROSUVASTATIN CALCIUM 20 MG PO TABS
40.0000 mg | ORAL_TABLET | Freq: Every day | ORAL | Status: DC
  Administered 2023-09-14 – 2023-09-16 (×3): 40 mg via ORAL
  Filled 2023-09-14: qty 2
  Filled 2023-09-14: qty 4
  Filled 2023-09-14: qty 2

## 2023-09-14 MED ORDER — PERFLUTREN LIPID MICROSPHERE
1.0000 mL | INTRAVENOUS | Status: AC | PRN
  Administered 2023-09-14: 4 mL via INTRAVENOUS

## 2023-09-14 MED ORDER — FENTANYL CITRATE PF 50 MCG/ML IJ SOSY
25.0000 ug | PREFILLED_SYRINGE | Freq: Once | INTRAMUSCULAR | Status: AC
  Administered 2023-09-14: 25 ug via INTRAVENOUS
  Filled 2023-09-14: qty 1

## 2023-09-14 MED ORDER — ACETAMINOPHEN 325 MG PO TABS
650.0000 mg | ORAL_TABLET | Freq: Four times a day (QID) | ORAL | Status: DC | PRN
  Administered 2023-09-14 – 2023-09-15 (×2): 650 mg via ORAL
  Filled 2023-09-14 (×2): qty 2

## 2023-09-14 MED ORDER — IPRATROPIUM-ALBUTEROL 0.5-2.5 (3) MG/3ML IN SOLN: 3.0000 mL | Freq: Four times a day (QID) | RESPIRATORY_TRACT | Status: DC | PRN

## 2023-09-14 NOTE — Assessment & Plan Note (Signed)
Oral B12.  

## 2023-09-14 NOTE — Assessment & Plan Note (Signed)
 Class II obesity with BMI of 36.87

## 2023-09-14 NOTE — Progress Notes (Addendum)
 Progress Note   Patient: Bobby Lane FMW:[id number] DOB: 05-14-1940 DOA: 09/13/2023     1 DOS: the patient was seen and examined on 09/14/2023   Brief hospital course: 83 year old male patient with a past medical history of clinical diagnosis of COPD, CAD medically managed, hypertension, heart failure with preserved EF 60 to 65% echo with moderate left ventricular hypertrophy of the basal septal and inferior segment causing mild LVO obstruction presenting to [facility] on 08/01 from home for generalized weakness and feeling that his legs are numb.  He was found to be hypotensive in the ED with MAP in the 50s.  Nonresponsive to fluids therefore was started on norepinephrine .   Labs with leukocytosis with a white count of 15.4, hemoglobin slightly lower than baseline at 12.8 from a baseline of 14.2 mg/dL, hematocrit at 61.3% and platelets at 249.  Also noted to have an AKI with a creatinine of 1.94 mg/dL from a baseline of 1.4 to 1.5 mg/dL.   CTA chest 08/01 - Negative for large or central PE. No parenchymal lung disease.    CT A/p 08/01 - Right rectus hematoma measuring 4.9x13.2 cm. No active extravastion.    Given undifferentiated shock, blood cultures were obtained and he was given broad-spectrum antibiotics with Vanco cefepime  and Flagyl .  8/2.  Patient complains of abdominal pain.  Admitted sharp.  Patient does not recall a fall but family states that he had a fall off his toilet.  Admitted with rectus sheath hematoma and hypotension.  Blood pressure better today.  Can transfer out of the ICU.  Assessment and Plan: * Shock (HCC) Resolved.  Blood pressure improved.  Holding antihypertensive medications.  Trend blood pressure.  Hematoma Rectus sheath.  Has allergy to numerous pain medications.  Tylenol  as needed for pain.  AKI (acute kidney injury) (HCC) Aki on ckd stage 3a. Creatinine 1.94 down to 1.25  Cough Sputum culture.  CT scan did not show pneumonia.  Will send sputum  culture.  Hyperlipidemia, unspecified On Crestor   Obesity (BMI 30-39.9) Class II obesity with BMI of 35.1  Vitamin B12 deficiency Oral B12  Hypothyroidism, unspecified On levothyroxine   Lactic acid acidosis Received ivf        Subjective: Patient complains of right abdominal pain.  He denied falls but patient's family stated he had fallen off the toilet.  Admitted with shock and rectus sheath hematoma  Physical Exam: Vitals:   09/14/23 0805 09/14/23 0900 09/14/23 1000 09/14/23 1120  BP:  127/64 122/62 (!) 114/57  Pulse:  83 97 73  Resp:  20 (!) 21 19  Temp:    98.3 F (36.8 C)  TempSrc:      SpO2: 95% 94% 97% 94%  Weight:      Height:       Physical Exam HENT:     Head: Normocephalic.  Eyes:     General: Lids are normal.     Conjunctiva/sclera: Conjunctivae normal.  Cardiovascular:     Rate and Rhythm: Normal rate and regular rhythm.     Heart sounds: Normal heart sounds, S1 normal and S2 normal.  Pulmonary:     Breath sounds: No decreased breath sounds, wheezing, rhonchi or rales.  Abdominal:     Palpations: Abdomen is soft.     Tenderness: There is abdominal tenderness.     Comments: Swelling right abdomen  Musculoskeletal:     Right lower leg: Swelling present.     Left lower leg: Swelling present.  Skin:    General:  Skin is warm.     Findings: No rash.  Neurological:     Mental Status: He is alert and oriented to person, place, and time.     Data Reviewed: Creatinine 1.25, ALT 55, procalcitonin negative, blood count 10.4, hemoglobin 10.6, MCV 100.5 CT scan chest abdomen pelvis reviewed showing rectus sheath hematoma  Family Communication: Family at bedside  Disposition: Status is: Inpatient Remains inpatient appropriate because: Watch hemoglobin closely uncontrolled pain  Planned Discharge Destination: To be determined based on physical therapy evaluation    Time spent: 28 minutes  Author: Charlie Patterson, MD 09/14/2023 11:38 AM  For  on call review [email or web address].

## 2023-09-14 NOTE — Assessment & Plan Note (Addendum)
 Resolved.  Initially admitted by the critical care service.  Blood pressure starting to rise and restarted on losartan  and lower dose Coreg .

## 2023-09-14 NOTE — Plan of Care (Signed)
  Problem: Clinical Measurements: Goal: Ability to maintain clinical measurements within normal limits will improve Outcome: Progressing   Problem: Pain Managment: Goal: General experience of comfort will improve and/or be controlled Outcome: Progressing   Problem: Safety: Goal: Ability to remain free from injury will improve Outcome: Progressing

## 2023-09-14 NOTE — Evaluation (Signed)
 Physical Therapy Evaluation Patient Details Name: Bobby Lane MRN: [id number] DOB: 1940/10/28 Today's Date: 09/14/2023  History of Present Illness  83 year old male patient with a past medical history of clinical diagnosis of COPD, CAD medically managed, hypertension, heart failure with preserved EF 60 to 65% echo with moderate left ventricular hypertrophy of the basal septal and inferior segment causing mild LVO obstruction presenting to [facility] on 08/01 from home for generalized weakness and feeling that his legs are numb.  He was found to be hypotensive in the ED  Clinical Impression  Pt HOH, but family did bring hearing aides part way through the session.  Pt ultimately showed some general weakness but did reasonably well with PT,  needing only light assist with mobility and transfers and was able to ambulate ~50 ft with walker, which is likely even more than he generally does at baseline.  Pt reports significant fatigue with the effort but generally did well.  Pt will benefit from continued PT to address functional limitations.        If plan is discharge home, recommend the following: Assist for transportation;A little help with bathing/dressing/bathroom   Can travel by private vehicle        Equipment Recommendations Other (comment)  Recommendations for Other Services       Functional Status Assessment Patient has had a recent decline in their functional status and demonstrates the ability to make significant improvements in function in a reasonable and predictable amount of time.     Precautions / Restrictions Precautions Precautions: Fall Recall of Precautions/Restrictions: Impaired Restrictions Weight Bearing Restrictions Per Provider Order: No      Mobility  Bed Mobility Overal bed mobility: Needs Assistance Bed Mobility: Supine to Sit     Supine to sit: Min assist     General bed mobility comments: Pt needed PT's arm to pull himself up to sitting     Transfers Overall transfer level: Needs assistance Equipment used: Rolling walker (2 wheels) Transfers: Sit to/from Stand Sit to Stand: Supervision           General transfer comment: Pt able to rise w/o direct assist up into walker.  Despite cuing he got up before PT asked him to but he showed good relative confidence    Ambulation/Gait Ambulation/Gait assistance: Min assist Gait Distance (Feet): 50 Feet Assistive device: Rolling walker (2 wheels)         General Gait Details: Pt was able to maintain a slow but steady cadence with heavy walker use but no LOBs.  Pt needing only  intermittent direct/tactile cuing for posture/walker manip, but ultimatley did relatively well - he and family reports this being near his baseline - though typically he will use a 4WW when not in his (preferred) scooter  Stairs            Wheelchair Mobility     Tilt Bed    Modified Rankin (Stroke Patients Only)       Balance Overall balance assessment: Needs assistance Sitting-balance support: No upper extremity supported Sitting balance-Leahy Scale: Good     Standing balance support: Bilateral upper extremity supported Standing balance-Leahy Scale: Fair                               Pertinent Vitals/Pain Pain Assessment Pain Assessment:  (minimal, c/o pain of IV and general/chronic arthritic joint issues)    Home Living Family/patient expects to be discharged to:: Private residence  Living Arrangements: Alone Available Help at Discharge: Available PRN/intermittently;Family (has someone clean Q2weeks, son and g-son will stop by after work with some regularity) Type of Home: Apartment Home Access: Level entry       Home Layout: One level Home Equipment: Agricultural consultant (2 wheels);Rollator (4 wheels);Electric scooter      Prior Function Prior Level of Function : Needs assist             Mobility Comments: reports he can still walk out to the car and  drive if he has to but mostly uses scooter and sleeps in lift recliner ADLs Comments: needs help with cleaning, laundy, etc     Extremity/Trunk Assessment   Upper Extremity Assessment Upper Extremity Assessment: Generalized weakness    Lower Extremity Assessment Lower Extremity Assessment: Generalized weakness       Communication   Communication Communication: Impaired Factors Affecting Communication: Hearing impaired    Cognition Arousal: Alert Behavior During Therapy: Impulsive, WFL for tasks assessed/performed   PT - Cognitive impairments:  (some confused rambling but mostly on point t/o session)                         Following commands: Intact (occasional impulsivity but did follow cuing)       Cueing Cueing Techniques: Verbal cues     General Comments General comments (skin integrity, edema, etc.): O2 remained in the 90s on room air, HR up to ~100 with ambulation    Exercises     Assessment/Plan    PT Assessment Patient needs continued PT services  PT Problem List Decreased strength;Decreased range of motion;Decreased activity tolerance;Decreased balance;Decreased mobility;Decreased coordination;Decreased cognition;Decreased knowledge of use of DME;Decreased safety awareness       PT Treatment Interventions DME instruction;Gait training;Stair training;Functional mobility training;Therapeutic activities;Therapeutic exercise;Balance training;Neuromuscular re-education;Patient/family education    PT Goals (Current goals can be found in the Care Plan section)  Acute Rehab PT Goals Patient Stated Goal: go home PT Goal Formulation: With patient Time For Goal Achievement: 09/14/23 Potential to Achieve Goals: Good    Frequency Min 2X/week     Co-evaluation               AM-PAC PT 6 Clicks Mobility  Outcome Measure Help needed turning from your back to your side while in a flat bed without using bedrails?: None Help needed moving from  lying on your back to sitting on the side of a flat bed without using bedrails?: A Little Help needed moving to and from a bed to a chair (including a wheelchair)?: A Little Help needed standing up from a chair using your arms (e.g., wheelchair or bedside chair)?: A Little Help needed to walk in hospital room?: A Little Help needed climbing 3-5 steps with a railing? : A Lot 6 Click Score: 18    End of Session Equipment Utilized During Treatment: Gait belt Activity Tolerance: Patient limited by fatigue;Patient tolerated treatment well Patient left: with call bell/phone within reach;in chair;with family/visitor present;with nursing/sitter in room Nurse Communication: Mobility status PT Visit Diagnosis: Muscle weakness (generalized) (M62.81);Difficulty in walking, not elsewhere classified (R26.2)    Time: 0922-1000 PT Time Calculation (min) (ACUTE ONLY): 38 min   Charges:   PT Evaluation $PT Eval Low Complexity: 1 Low PT Treatments $Gait Training: 8-22 mins PT General Charges $$ ACUTE PT VISIT: 1 Visit         Carmin JONELLE Deed, DPT 09/14/2023, 11:42 AM

## 2023-09-14 NOTE — Assessment & Plan Note (Signed)
 Aki on ckd stage 3a. Creatinine 1.94 down to 1.25

## 2023-09-14 NOTE — Progress Notes (Signed)
  Echocardiogram 2D Echocardiogram has been performed. Definity  IV ultrasound imaging agent used on this study.  Bobby Lane 09/14/2023, 9:30 AM

## 2023-09-14 NOTE — Assessment & Plan Note (Signed)
 Sputum culture.  CT scan did not show pneumonia.  Will send sputum culture.

## 2023-09-14 NOTE — Progress Notes (Signed)
 Patient arrived on bed to floor, brought down by Tec, accompanied by relatives. Comfortable with no complaint. Orientated to room. Side table place next to bed and call bell given to patient

## 2023-09-14 NOTE — Evaluation (Signed)
 Occupational Therapy Evaluation Patient Details Name: Bobby Lane MRN: [id number] DOB: 11-19-1940 Today's Date: 09/14/2023   History of Present Illness   83 year old male patient with a past medical history of clinical diagnosis of COPD, CAD medically managed, hypertension, heart failure with preserved EF 60 to 65% echo with moderate left ventricular hypertrophy of the basal septal and inferior segment causing mild LVO obstruction presenting to [facility] on 08/01 from home for generalized weakness and feeling that his legs are numb.  He was found to be hypotensive in the ED     Clinical Impressions Pt seen this date for OT evaluation.  Patient lives alone in an apartment at [facility] which is a senior apartment living.  Patient has supportive family with his son and grandson who help as needed with medication, laundry and cleaning.  Patient reports he still is able to drive, also has a scooter to use when he is outside of his apartment.  Within his apartment he uses a rollator for mobility.  He would like to return to his apartment upon discharge.  Patient presents with muscle weakness, decreased range of motion, pain, decreased ability to perform self-care and IADL tasks, and decreased functional mobility.  Patient would benefit from skilled occupational therapy services to maximize his safety and independence in necessary daily tasks at home and in the community and to reduce caregiver burden.     If plan is discharge home, recommend the following:   A little help with walking and/or transfers;A little help with bathing/dressing/bathroom;Assistance with cooking/housework;Direct supervision/assist for medications management;Assist for transportation     Functional Status Assessment   Patient has had a recent decline in their functional status and demonstrates the ability to make significant improvements in function in a reasonable and predictable amount of time.     Equipment  Recommendations         Recommendations for Other Services         Precautions/Restrictions   Precautions Precautions: Fall Restrictions Weight Bearing Restrictions Per Provider Order: No     Mobility Bed Mobility Overal bed mobility: Needs Assistance Bed Mobility: Supine to Sit     Supine to sit: Min assist          Transfers Overall transfer level: Needs assistance Equipment used: Rolling walker (2 wheels) Transfers: Sit to/from Stand Sit to Stand: Contact guard assist                  Balance Overall balance assessment: Needs assistance Sitting-balance support: No upper extremity supported Sitting balance-Leahy Scale: Good     Standing balance support: Bilateral upper extremity supported Standing balance-Leahy Scale: Fair                             ADL either performed or assessed with clinical judgement   ADL Overall ADL's : Needs assistance/impaired Eating/Feeding: Independent   Grooming: Modified independent;Sitting   Upper Body Bathing: Modified independent;Sitting   Lower Body Bathing: Minimal assistance   Upper Body Dressing : Modified independent   Lower Body Dressing: Minimal assistance   Toilet Transfer: Minimal assistance   Toileting- Clothing Manipulation and Hygiene: Minimal assistance       Functional mobility during ADLs: Contact guard assist       Vision Baseline Vision/History: 1 Wears glasses       Perception         Praxis         Pertinent Vitals/Pain Pain  Assessment Pain Assessment: 0-10 Pain Score: 4  Pain Location: right side near stomach Pain Descriptors / Indicators: Aching, Sharp Pain Intervention(s): Limited activity within patient's tolerance, Monitored during session, Repositioned     Extremity/Trunk Assessment Upper Extremity Assessment Upper Extremity Assessment: Generalized weakness;Right hand dominant;RUE deficits/detail;LUE deficits/detail RUE Deficits / Details:  right shoulder with limited ROM to 70 degrees of shoulder flexion RUE Sensation: WNL RUE Coordination: WNL LUE Deficits / Details: Shoulder flexion to 110 degrees LUE Sensation: WNL LUE Coordination: decreased fine motor (Pt missing IP of thumb on left and demonstrates decreased coordination skills and opposition.)   Lower Extremity Assessment Lower Extremity Assessment: Defer to PT evaluation       Communication Communication Communication: Impaired Factors Affecting Communication: Hearing impaired   Cognition Arousal: Alert Behavior During Therapy: Impulsive, WFL for tasks assessed/performed                                 Following commands: Intact       Cueing  General Comments   Cueing Techniques: Verbal cues      Exercises     Shoulder Instructions      Home Living Family/patient expects to be discharged to:: Private residence Living Arrangements: Alone Available Help at Discharge: Available PRN/intermittently;Family Type of Home: Apartment Home Access: Level entry     Home Layout: One level     Bathroom Shower/Tub: Producer, television/film/video: Handicapped height     Home Equipment: Agricultural consultant (2 wheels);Rollator (4 wheels);Electric scooter          Prior Functioning/Environment Prior Level of Function : Needs assist       Physical Assist : ADLs (physical)   ADLs (physical): IADLs Mobility Comments: He uses a rollator around the apartment, a scooter when he leaves the apartment.  He still drives at times. ADLs Comments: no longer uses shower secondary to fear of falling, states shower head is too high and has asked the complex to lower.  He reports his son and grandson help him when needed.  He requires assist with doing laundry and cleaning his apartment.  He reports he no longer sleeps in his bed, he sleeps in the recliner chair.    OT Problem List: Decreased strength;Decreased activity tolerance;Decreased knowledge of  use of DME or AE;Decreased range of motion;Impaired balance (sitting and/or standing);Pain   OT Treatment/Interventions: Self-care/ADL training;Balance training;DME and/or AE instruction;Therapeutic exercise;Therapeutic activities;Patient/family education      OT Goals(Current goals can be found in the care plan section)   Acute Rehab OT Goals Patient Stated Goal: pt would like to return to his apartment and be as independent as possible. OT Goal Formulation: With patient Time For Goal Achievement: 09/28/23 Potential to Achieve Goals: Good ADL Goals Pt Will Perform Lower Body Bathing: with set-up Pt Will Perform Lower Body Dressing: with modified independence Pt Will Transfer to Toilet: with modified independence   OT Frequency:  Min 2X/week    Co-evaluation              AM-PAC OT 6 Clicks Daily Activity     Outcome Measure Help from another person eating meals?: None Help from another person taking care of personal grooming?: None Help from another person toileting, which includes using toliet, bedpan, or urinal?: A Little Help from another person bathing (including washing, rinsing, drying)?: A Little Help from another person to put on and taking off regular upper body  clothing?: None Help from another person to put on and taking off regular lower body clothing?: A Little 6 Click Score: 21   End of Session Equipment Utilized During Treatment: Gait belt;Rolling walker (2 wheels)  Activity Tolerance: Patient tolerated treatment well;Patient limited by pain Patient left: in bed;with call bell/phone within reach;with bed alarm set  OT Visit Diagnosis: Unsteadiness on feet (R26.81);Muscle weakness (generalized) (M62.81);History of falling (Z91.81);Pain Pain - Right/Left: Right Pain - part of body:  (right side at stomach)                Time: 8841-8772 OT Time Calculation (min): 29 min Charges:  OT General Charges $OT Visit: 1 Visit OT Evaluation $OT Eval Moderate  Complexity: 1 Mod OT Treatments $Self Care/Home Management : 8-22 mins   Mariyana Fulop T Olufemi Mofield, OTR/L, CLT  Anasofia Micallef 09/14/2023, 3:36 PM

## 2023-09-14 NOTE — Assessment & Plan Note (Signed)
 Received ivf

## 2023-09-14 NOTE — Assessment & Plan Note (Signed)
-  On levothyroxine

## 2023-09-14 NOTE — Assessment & Plan Note (Signed)
 Rectus sheath.  His body will reabsorb this over time.  Hemoglobin stable at 11.5.  Has allergy to numerous pain medications.  Tylenol  as needed for pain.

## 2023-09-14 NOTE — Progress Notes (Signed)
   09/14/23 1000  Spiritual Encounters  Type of Visit Initial  Care provided to: Family  Referral source Other (comment) (Spiritual Consult)  Reason for visit Advance directives  OnCall Visit No   Chaplain responded to a spiritual consult for advanced directive education.  The patient, Bobby Lane, was receiving care at the time of my visit. I met with his son, Bobby Lane, and introduced the paperwork to him. He agree this may be something that would be of benefit to his father and would consult with his father about it.   If there are any questions that may rise from their conversation a chaplain will respond.   Carley [place] [facility]  [phone]

## 2023-09-14 NOTE — Hospital Course (Signed)
 83 year old male patient with a past medical history of clinical diagnosis of COPD, CAD medically managed, hypertension, heart failure with preserved EF 60 to 65% echo with moderate left ventricular hypertrophy of the basal septal and inferior segment causing mild LVO obstruction presenting to [facility] on 08/01 from home for generalized weakness and feeling that his legs are numb.  He was found to be hypotensive in the ED with MAP in the 50s.  Nonresponsive to fluids therefore was started on norepinephrine .   Labs with leukocytosis with a white count of 15.4, hemoglobin slightly lower than baseline at 12.8 from a baseline of 14.2 mg/dL, hematocrit at 61.3% and platelets at 249.  Also noted to have an AKI with a creatinine of 1.94 mg/dL from a baseline of 1.4 to 1.5 mg/dL.   CTA chest 08/01 - Negative for large or central PE. No parenchymal lung disease.    CT A/p 08/01 - Right rectus hematoma measuring 4.9x13.2 cm. No active extravastion.    Given undifferentiated shock, blood cultures were obtained and he was given broad-spectrum antibiotics with Vanco cefepime  and Flagyl .  8/2.  Patient complains of abdominal pain.  Admitted sharp.  Patient does not recall a fall but family states that he had a fall off his toilet.  Admitted with rectus sheath hematoma and hypotension.  Blood pressure better today.  Can transfer out of the ICU. 8/3.  Patient complains of wheeze this morning.  Still has some sharp abdominal pain.  Only can take Tylenol .  Started on Solu-Medrol  this morning. 8/4.  Patient ambulated with physical therapy today.  He has a lift chair at home.  He has a walker.  Still having abdominal pain.  Interventional radiology reviewed CT scan from admission and recommended observation.  Patient will be switched over to prednisone  and Augmentin  upon discharge.

## 2023-09-14 NOTE — Assessment & Plan Note (Signed)
 On Crestor

## 2023-09-15 DIAGNOSIS — R059 Cough, unspecified: Secondary | ICD-10-CM

## 2023-09-15 DIAGNOSIS — T148XXA Other injury of unspecified body region, initial encounter: Secondary | ICD-10-CM | POA: Diagnosis not present

## 2023-09-15 DIAGNOSIS — N189 Chronic kidney disease, unspecified: Secondary | ICD-10-CM

## 2023-09-15 DIAGNOSIS — N179 Acute kidney failure, unspecified: Secondary | ICD-10-CM | POA: Diagnosis not present

## 2023-09-15 DIAGNOSIS — E538 Deficiency of other specified B group vitamins: Secondary | ICD-10-CM

## 2023-09-15 DIAGNOSIS — E669 Obesity, unspecified: Secondary | ICD-10-CM

## 2023-09-15 DIAGNOSIS — E785 Hyperlipidemia, unspecified: Secondary | ICD-10-CM

## 2023-09-15 DIAGNOSIS — E872 Acidosis, unspecified: Secondary | ICD-10-CM

## 2023-09-15 DIAGNOSIS — E039 Hypothyroidism, unspecified: Secondary | ICD-10-CM

## 2023-09-15 DIAGNOSIS — R579 Shock, unspecified: Secondary | ICD-10-CM | POA: Diagnosis not present

## 2023-09-15 LAB — BASIC METABOLIC PANEL WITH GFR
Anion gap: 7 (ref 5–15)
BUN: 26 mg/dL — ABNORMAL HIGH (ref 8–23)
CO2: 27 mmol/L (ref 22–32)
Calcium: 8.5 mg/dL — ABNORMAL LOW (ref 8.9–10.3)
Chloride: 104 mmol/L (ref 98–111)
Creatinine, Ser: 1.22 mg/dL (ref 0.61–1.24)
GFR, Estimated: 60 mL/min — ABNORMAL LOW (ref >60)
Glucose, Bld: 89 mg/dL (ref 70–99)
Potassium: 3.8 mmol/L (ref 3.5–5.1)
Sodium: 138 mmol/L (ref 135–145)

## 2023-09-15 LAB — EXPECTORATED SPUTUM ASSESSMENT W GRAM STAIN, RFLX TO RESP C: Special Requests: NORMAL

## 2023-09-15 LAB — CBC
HCT: 34.9 % — ABNORMAL LOW (ref 39.0–52.0)
Hemoglobin: 11.5 g/dL — ABNORMAL LOW (ref 13.0–17.0)
MCH: 33.8 pg (ref 26.0–34.0)
MCHC: 33 g/dL (ref 30.0–36.0)
MCV: 102.6 fL — ABNORMAL HIGH (ref 80.0–100.0)
Platelets: 201 K/uL (ref 150–400)
RBC: 3.4 MIL/uL — ABNORMAL LOW (ref 4.22–5.81)
RDW: 15.2 % (ref 11.5–15.5)
WBC: 10.2 K/uL (ref 4.0–10.5)
nRBC: 0 % (ref 0.0–0.2)

## 2023-09-15 LAB — VITAMIN B12: Vitamin B-12: 3027 pg/mL — ABNORMAL HIGH (ref 180–914)

## 2023-09-15 MED ORDER — VITAMIN B-12 1000 MCG PO TABS
5000.0000 ug | ORAL_TABLET | Freq: Every day | ORAL | Status: DC
  Administered 2023-09-15 – 2023-09-16 (×2): 5000 ug via ORAL
  Filled 2023-09-15 (×2): qty 5

## 2023-09-15 MED ORDER — CARVEDILOL 3.125 MG PO TABS
3.1250 mg | ORAL_TABLET | Freq: Two times a day (BID) | ORAL | Status: DC
  Administered 2023-09-15 (×2): 3.125 mg via ORAL
  Filled 2023-09-15 (×2): qty 1

## 2023-09-15 MED ORDER — METHYLPREDNISOLONE SODIUM SUCC 40 MG IJ SOLR
40.0000 mg | Freq: Every day | INTRAMUSCULAR | Status: DC
  Administered 2023-09-15 – 2023-09-16 (×2): 40 mg via INTRAVENOUS
  Filled 2023-09-15 (×2): qty 1

## 2023-09-15 NOTE — Plan of Care (Signed)

## 2023-09-15 NOTE — Plan of Care (Signed)
 The patient has slept well tonight w/o s/s of acute distress noted.

## 2023-09-15 NOTE — Progress Notes (Signed)
 Progress Note   Patient: Bobby Lane FMW:[id number] DOB: 04-Jun-1940 DOA: 09/13/2023     2 DOS: the patient was seen and examined on 09/15/2023   Brief hospital course: 83 year old male patient with a past medical history of clinical diagnosis of COPD, CAD medically managed, hypertension, heart failure with preserved EF 60 to 65% echo with moderate left ventricular hypertrophy of the basal septal and inferior segment causing mild LVO obstruction presenting to [facility] on 08/01 from home for generalized weakness and feeling that his legs are numb.  He was found to be hypotensive in the ED with MAP in the 50s.  Nonresponsive to fluids therefore was started on norepinephrine .   Labs with leukocytosis with a white count of 15.4, hemoglobin slightly lower than baseline at 12.8 from a baseline of 14.2 mg/dL, hematocrit at 61.3% and platelets at 249.  Also noted to have an AKI with a creatinine of 1.94 mg/dL from a baseline of 1.4 to 1.5 mg/dL.   CTA chest 08/01 - Negative for large or central PE. No parenchymal lung disease.    CT A/p 08/01 - Right rectus hematoma measuring 4.9x13.2 cm. No active extravastion.    Given undifferentiated shock, blood cultures were obtained and he was given broad-spectrum antibiotics with Vanco cefepime  and Flagyl .  8/2.  Patient complains of abdominal pain.  Admitted sharp.  Patient does not recall a fall but family states that he had a fall off his toilet.  Admitted with rectus sheath hematoma and hypotension.  Blood pressure better today.  Can transfer out of the ICU. 8/3.  Patient complains of wheeze this morning.  Still has some sharp abdominal pain.  Only can take Tylenol .  Started on Solu-Medrol  this morning.  Assessment and Plan: * Shock (HCC) Resolved.  Blood pressure starting to rise.  Restarting blood pressure medications.  Hematoma Rectus sheath.  His body will reabsorb this over time.  Hemoglobin stable at 11.5.  Has allergy to numerous pain medications.   Tylenol  as needed for pain.  Acute kidney injury superimposed on CKD (HCC) Aki on ckd stage 3a. Creatinine 1.94 down to 1.22.  Cough Sputum culture.  CT scan did not show pneumonia.  Will send sputum culture.  Blood cultures negative.  Started on Solu-Medrol  for wheeze.  Patient on empiric antibiotic.  Procalcitonin negative.  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        Subjective: Patient complained of weakness this morning.  Blood pressure starting to rise out of place back on blood pressure medication.  Physical Exam: Vitals:   09/15/23 0421 09/15/23 0500 09/15/23 0814 09/15/23 1138  BP: (!) 142/78  (!) 157/77 136/65  Pulse: 88  79 93  Resp:   17 17  Temp: 98.1 F (36.7 C)  98 F (36.7 C) 97.8 F (36.6 C)  TempSrc: Oral     SpO2: 96%  97% 93%  Weight:  100.5 kg    Height:       Physical Exam HENT:     Head: Normocephalic.  Eyes:     General: Lids are normal.     Conjunctiva/sclera: Conjunctivae normal.  Cardiovascular:     Rate and Rhythm: Normal rate and regular rhythm.     Heart sounds: Normal heart sounds, S1 normal and S2 normal.  Pulmonary:  Breath sounds: No decreased breath sounds, wheezing, rhonchi or rales.  Abdominal:     Palpations: Abdomen is soft.     Tenderness: There is abdominal tenderness.     Comments: Swelling right abdomen  Musculoskeletal:     Right lower leg: Swelling present.     Left lower leg: Swelling present.  Skin:    General: Skin is warm.     Findings: No rash.  Neurological:     Mental Status: He is alert and oriented to person, place, and time.     Data Reviewed: Creatinine 1.22, electrolytes normal range, vitamin B12 3000 2017, white blood cell count 10.2, hemoglobin 11.5,  platelet count 211  Family Communication: Spoke with son on the phone  Disposition: Status is: Inpatient Remains inpatient appropriate because: Started Solu-Medrol  for wheeze.  Tylenol  for pain control.  Restarting low-dose Coreg  and losartan  for blood pressure.  Planned Discharge Destination: Home with Home Health    Time spent: 28 minutes  Author: Charlie Patterson, MD 09/15/2023 3:06 PM  For on call review [email or web address].

## 2023-09-15 NOTE — Assessment & Plan Note (Signed)
 Restarted low-dose Coreg  and losartan .

## 2023-09-15 NOTE — Progress Notes (Signed)
 This Clinical research associate collected a sputum specimen from the patient and sent it to the lab with label date and time. The lab notified this writer that the sputum specimen was not appropriate for a sputum culture and needs to be recollected.

## 2023-09-16 DIAGNOSIS — T148XXA Other injury of unspecified body region, initial encounter: Secondary | ICD-10-CM | POA: Diagnosis not present

## 2023-09-16 DIAGNOSIS — N179 Acute kidney failure, unspecified: Secondary | ICD-10-CM | POA: Diagnosis not present

## 2023-09-16 DIAGNOSIS — R059 Cough, unspecified: Secondary | ICD-10-CM | POA: Diagnosis not present

## 2023-09-16 DIAGNOSIS — I10 Essential (primary) hypertension: Secondary | ICD-10-CM

## 2023-09-16 DIAGNOSIS — R579 Shock, unspecified: Secondary | ICD-10-CM | POA: Diagnosis not present

## 2023-09-16 LAB — CBC
HCT: 36.9 % — ABNORMAL LOW (ref 39.0–52.0)
Hemoglobin: 12.2 g/dL — ABNORMAL LOW (ref 13.0–17.0)
MCH: 33.5 pg (ref 26.0–34.0)
MCHC: 33.1 g/dL (ref 30.0–36.0)
MCV: 101.4 fL — ABNORMAL HIGH (ref 80.0–100.0)
Platelets: 230 K/uL (ref 150–400)
RBC: 3.64 MIL/uL — ABNORMAL LOW (ref 4.22–5.81)
RDW: 14.6 % (ref 11.5–15.5)
WBC: 10.6 K/uL — ABNORMAL HIGH (ref 4.0–10.5)
nRBC: 0 % (ref 0.0–0.2)

## 2023-09-16 MED ORDER — ALBUTEROL SULFATE HFA 108 (90 BASE) MCG/ACT IN AERS: 2.0000 | INHALATION_SPRAY | Freq: Four times a day (QID) | RESPIRATORY_TRACT | 0 refills | Status: DC | PRN

## 2023-09-16 MED ORDER — CARVEDILOL 6.25 MG PO TABS: 6.2500 mg | ORAL_TABLET | Freq: Two times a day (BID) | ORAL | 0 refills | Status: DC

## 2023-09-16 MED ORDER — GUAIFENESIN ER 600 MG PO TB12: 600.0000 mg | ORAL_TABLET | Freq: Two times a day (BID) | ORAL | 0 refills | Status: DC

## 2023-09-16 MED ORDER — CARVEDILOL 6.25 MG PO TABS
6.2500 mg | ORAL_TABLET | Freq: Two times a day (BID) | ORAL | Status: DC
  Administered 2023-09-16: 6.25 mg via ORAL
  Filled 2023-09-16 (×2): qty 1

## 2023-09-16 MED ORDER — POLYETHYLENE GLYCOL 3350 17 G PO PACK: 17.0000 g | PACK | Freq: Every day | ORAL | 0 refills | Status: DC | PRN

## 2023-09-16 MED ORDER — PREDNISONE 20 MG PO TABS: 40.0000 mg | ORAL_TABLET | Freq: Every day | ORAL | 0 refills | Status: AC

## 2023-09-16 MED ORDER — AMOXICILLIN-POT CLAVULANATE 875-125 MG PO TABS: 1.0000 | ORAL_TABLET | Freq: Two times a day (BID) | ORAL | 0 refills | Status: AC

## 2023-09-16 NOTE — Progress Notes (Signed)
 Physical Therapy Treatment Patient Details Name: Bobby Lane MRN: [id number] DOB: Mar 02, 1940 Today's Date: 09/16/2023   History of Present Illness 83 year old male patient with a past medical history of clinical diagnosis of COPD, CAD medically managed, hypertension, heart failure with preserved EF 60 to 65% echo with moderate left ventricular hypertrophy of the basal septal and inferior segment causing mild LVO obstruction presenting to [facility] on 08/01 from home for generalized weakness and feeling that his legs are numb.  He was found to be hypotensive in the ED    PT Comments  Pt tolerated treatment well today despite increased RLQ pain with exertion. Treatment focused on functional strength training and improved endurance with OOB mobility. Pt able to demonstrate improvements in assist levels, ambulation distance, standing balance, and endurance since previous session. He is min assist for bed mobility (normally sleeps in lift recliner), supervision for transfers, and supervision to ambulate 15ft in RW.   Increased time/effort required during session for hand hygiene, linen change, and toileting. Pt left seated on BSC for toileting and educated to call for help; pt educated on modified ways for pericare as he states he has difficulty with this. Pt will continue to benefit from skilled acute PT services to address deficits for return to baseline function. Will continue per POC.     If plan is discharge home, recommend the following: Assist for transportation;A little help with bathing/dressing/bathroom   Can travel by private vehicle      Yes  Equipment Recommendations  Other (comment)       Precautions / Restrictions Precautions Precautions: Fall Precaution/Restrictions Comments: I/O Restrictions Weight Bearing Restrictions Per Provider Order: No     Mobility  Bed Mobility   Bed Mobility: Supine to Sit     Supine to sit: HOB elevated, Min assist     General bed mobility  comments: min assist for trunk facilitation to sit EOB, HOB elevated, use of bedrail and UE support on PT for facilitation    Transfers Overall transfer level: Needs assistance Equipment used: Rolling walker (2 wheels) Transfers: Sit to/from Stand Sit to Stand: Supervision           General transfer comment: supervision for safety to perform multiple STS transfers at EOB and BSC with RW; verbal cues for safety, sequencing, and hand placement; demonstrates poor eccentric lowering despite proper hand placement.    Ambulation/Gait Ambulation/Gait assistance: Supervision Gait Distance (Feet): 95 Feet           General Gait Details: SBA for safety and line management to ambulate to/from bathroom and in hallway with RW. Demonstrates slowed cadence with decreased step length/foot clearance bilaterally and decreased RW proximity, as he uses rollator at baseline      Balance Overall balance assessment: Needs assistance Sitting-balance support: No upper extremity supported Sitting balance-Leahy Scale: Good     Standing balance support: Bilateral upper extremity supported, Reliant on assistive device for balance Standing balance-Leahy Scale: Good Standing balance comment: stands at sink for hand hygiene, no UE support, feet hip width apart, no LOB                            Communication Communication Communication: Impaired Factors Affecting Communication: Hearing impaired  Cognition Arousal: Alert Behavior During Therapy: Impulsive, WFL for tasks assessed/performed  Following commands: Intact      Cueing Cueing Techniques: Verbal cues  Exercises Other Exercises Other Exercises: Pt educated re: PT role/POC, DC recommendations, safety with functional mobility, call for help. He verbalized understanding.        Pertinent Vitals/Pain Pain Assessment Pain Assessment: Faces Faces Pain Scale: Hurts little more (0/10 at  rest, exacerbated with exertion) Pain Location: right side near stomach Pain Descriptors / Indicators: Aching, Sharp Pain Intervention(s): Monitored during session, Repositioned     PT Goals (current goals can now be found in the care plan section) Acute Rehab PT Goals Patient Stated Goal: go home PT Goal Formulation: With patient Time For Goal Achievement: 09/14/23 Potential to Achieve Goals: Good Progress towards PT goals: Progressing toward goals    Frequency    Min 2X/week       AM-PAC PT 6 Clicks Mobility   Outcome Measure  Help needed turning from your back to your side while in a flat bed without using bedrails?: None Help needed moving from lying on your back to sitting on the side of a flat bed without using bedrails?: A Little Help needed moving to and from a bed to a chair (including a wheelchair)?: A Little Help needed standing up from a chair using your arms (e.g., wheelchair or bedside chair)?: A Little Help needed to walk in hospital room?: A Little Help needed climbing 3-5 steps with a railing? : A Little 6 Click Score: 19    End of Session Equipment Utilized During Treatment: Gait belt Activity Tolerance: Patient limited by fatigue;Patient tolerated treatment well Patient left: with call bell/phone within reach (left seated on [facility] for toileting (per pt request), RN notified and pt instructed to call for help when finished; he verbalized understanding) Nurse Communication: Mobility status PT Visit Diagnosis: Muscle weakness (generalized) (M62.81);Difficulty in walking, not elsewhere classified (R26.2)     Time: 8984-8941 PT Time Calculation (min) (ACUTE ONLY): 43 min  Charges:    $Therapeutic Activity: 38-52 mins PT General Charges $$ ACUTE PT VISIT: 1 Visit                      Camie CHARLENA Kluver, PT, DPT 11:43 AM,09/16/23 Physical Therapist - [facility] [facility]

## 2023-09-16 NOTE — TOC Transition Note (Signed)
 Transition of Care [facility]) - Discharge Note   Patient Details  Name: Bobby Lane MRN: [id number] Date of Birth: [birth year]  [facility] CM/SW Contact:  Dalia GORMAN Fuse, RN Phone Number: 09/16/2023, 12:57 PM   Clinical Narrative:    Patient is from independent living at [facility]. He is able to drive himself to and from appts. He has several rollator walkers, and a lift chair. His shower has grab bars. Patient offered choice for [facility] PT/OT, but didn't have a preference. TOC sent referral for [facility] PT/OT to [facility], Shaun accepted the referral. No other TOC needs identified.    Final next level of care: Home w Home Health Services Barriers to Discharge: Barriers Resolved   Patient Goals and CMS Choice     Choice offered to / list presented to : Patient      Discharge Placement                       Discharge Plan and Services Additional resources added to the After Visit Summary for                            [facility] Arranged: PT, OT HH Agency: [facility] ([facility]) Date HH Agency Contacted: 09/16/23 Time HH Agency Contacted: [id number] Representative spoke with at [facility] Agency: [facility]  Social Drivers of Health (SDOH) Interventions SDOH Screenings   Food Insecurity: No Food Insecurity (09/13/2023)  Housing: Low Risk  (09/13/2023)  Transportation Needs: No Transportation Needs (09/13/2023)  Utilities: Not At Risk (09/13/2023)  Alcohol Screen: Low Risk  (07/23/2022)  Depression (PHQ2-9): Low Risk  (07/23/2022)  Financial Resource Strain: Low Risk  (07/23/2022)  Physical Activity: Inactive (07/23/2022)  Social Connections: Socially Isolated (09/13/2023)  Stress: No Stress Concern Present (07/23/2022)  Tobacco Use: Low Risk  (05/28/2023)     Readmission Risk Interventions     No data to display

## 2023-09-16 NOTE — Discharge Summary (Signed)
 Physician Discharge Summary   Patient: Bobby Lane MRN: [id number] DOB: 04-23-1940  Admit date:     09/13/2023  Discharge date: 09/16/23  Discharge Physician: Charlie Patterson   PCP: Trudy Dorn FORBES, MD   Recommendations at discharge:   Follow-up PCP 5 days  Discharge Diagnoses: Principal Problem:   Shock (HCC) Active Problems:   Hematoma   Cough   Acute kidney injury superimposed on CKD (HCC)   Hyperlipidemia, unspecified   Obesity (BMI 30-39.9)   Vitamin B12 deficiency   Hypothyroidism, unspecified   Essential hypertension   Lactic acid acidosis    Hospital Course: 83 year old male patient with a past medical history of clinical diagnosis of COPD, CAD medically managed, hypertension, heart failure with preserved EF 60 to 65% echo with moderate left ventricular hypertrophy of the basal septal and inferior segment causing mild LVO obstruction presenting to [facility] on 08/01 from home for generalized weakness and feeling that his legs are numb.  He was found to be hypotensive in the ED with MAP in the 50s.  Nonresponsive to fluids therefore was started on norepinephrine .   Labs with leukocytosis with a white count of 15.4, hemoglobin slightly lower than baseline at 12.8 from a baseline of 14.2 mg/dL, hematocrit at 61.3% and platelets at 249.  Also noted to have an AKI with a creatinine of 1.94 mg/dL from a baseline of 1.4 to 1.5 mg/dL.   CTA chest 08/01 - Negative for large or central PE. No parenchymal lung disease.    CT A/p 08/01 - Right rectus hematoma measuring 4.9x13.2 cm. No active extravastion.    Given undifferentiated shock, blood cultures were obtained and he was given broad-spectrum antibiotics with Vanco cefepime  and Flagyl .  8/2.  Patient complains of abdominal pain.  Admitted sharp.  Patient does not recall a fall but family states that he had a fall off his toilet.  Admitted with rectus sheath hematoma and hypotension.  Blood pressure better today.  Can transfer  out of the ICU. 8/3.  Patient complains of wheeze this morning.  Still has some sharp abdominal pain.  Only can take Tylenol .  Started on Solu-Medrol  this morning. 8/4.  Patient ambulated with physical therapy today.  He has a lift chair at home.  He has a walker.  Still having abdominal pain.  Interventional radiology reviewed CT scan from admission and recommended observation.  Patient will be switched over to prednisone  and Augmentin  upon discharge.  Assessment and Plan: * Shock (HCC) Resolved.  Initially admitted by the critical care service.  Blood pressure starting to rise and restarted on losartan  and lower dose Coreg .  Hematoma Rectus sheath.  His body will reabsorb this over time.  Case discussed with interventional radiology and recommended observation.  Hemoglobin stable at 12.2.  Has allergy to numerous pain medications.  Tylenol  as needed for pain.  Acute kidney injury superimposed on CKD (HCC) Aki on ckd stage 3a. Creatinine 1.94 down to 1.22 with IV fluid hydration  Cough Sputum culture not acceptable for testing.  CT scan did not show pneumonia.  Blood cultures negative.  Started on Solu-Medrol  for wheeze.  Patient on empiric antibiotic.  Procalcitonin negative.  Rocephin  switched over to Augmentin  for discharge.  Hyperlipidemia, unspecified On Crestor   Obesity (BMI 30-39.9) Class II obesity with BMI of 35.11  Vitamin B12 deficiency Oral B12  Hypothyroidism, unspecified On levothyroxine   Lactic acid acidosis Received ivf  Essential hypertension Restarted low-dose Coreg  and losartan .  Consultants: Initially was in critical care service Procedures performed: None Disposition: Home health Diet recommendation:  Cardiac diet DISCHARGE MEDICATION: Allergies as of 09/16/2023       Reactions   Codeine Nausea And Vomiting   Morphine And Codeine Nausea And Vomiting   Novocain [procaine] Hives   NO TROUBLE IN OR   Other Nausea And Vomiting   Percocet  [oxycodone-acetaminophen ] Nausea And Vomiting   Sulfa Antibiotics Hives   Acetaminophen     Atorvastatin    Tramadol  Nausea And Vomiting   Note: upset stomach Note: upset stomach        Medication List     TAKE these medications    acetaminophen  650 MG CR tablet Commonly known as: TYLENOL  Take 1,300 mg by mouth every 8 (eight) hours as needed for pain.   albuterol  108 (90 Base) MCG/ACT inhaler Commonly known as: VENTOLIN  HFA Inhale 2 puffs into the lungs every 6 (six) hours as needed for wheezing or shortness of breath. What changed: See the new instructions.   allopurinol  300 MG tablet Commonly known as: ZYLOPRIM  Take 1 tablet by mouth once daily   amoxicillin -clavulanate 875-125 MG tablet Commonly known as: AUGMENTIN  Take 1 tablet by mouth 2 (two) times daily for 4 days.   carvedilol  6.25 MG tablet Commonly known as: COREG  Take 1 tablet (6.25 mg total) by mouth 2 (two) times daily with a meal. What changed:  medication strength how much to take when to take this   cyanocobalamin  1000 MCG tablet Take 1,000 mcg by mouth daily.   DULoxetine  30 MG capsule Commonly known as: CYMBALTA  Take 1 capsule by mouth twice daily   ezetimibe  10 MG tablet Commonly known as: ZETIA  Take 1 tablet (10 mg total) by mouth daily.   Gemtesa  75 MG Tabs Generic drug: Vibegron  Take 1 tablet (75 mg total) by mouth daily.   guaiFENesin  600 MG 12 hr tablet Commonly known as: Mucinex  Take 1 tablet (600 mg total) by mouth 2 (two) times daily for 10 days.   levothyroxine  125 MCG tablet Commonly known as: SYNTHROID  Take 1 tablet (125 mcg total) by mouth daily.   losartan  50 MG tablet Commonly known as: COZAAR  Take 1 tablet (50 mg total) by mouth daily.   polyethylene glycol 17 g packet Commonly known as: MIRALAX  / GLYCOLAX  Take 17 g by mouth daily as needed for moderate constipation.   predniSONE  20 MG tablet Commonly known as: DELTASONE  Take 2 tablets (40 mg total) by mouth  daily for 3 doses. Start taking on: September 17, 2023   rosuvastatin  40 MG tablet Commonly known as: CRESTOR  Take 40 mg by mouth daily.   Vitamin D3 125 MCG (5000 UT) Tabs Take 5,000 Units by mouth daily.        Follow-up Information     Trudy Dorn BRAVO, MD. Go on 09/20/2023.   Why: Friday, 8/8 at 11:00 a.m. hospital follow up Contact information: [address] [place] [address] [phone]                Discharge Exam: Fredricka Weights   09/14/23 0500 09/15/23 0500 09/16/23 0539  Weight: 97.6 kg 100.5 kg 95.7 kg   Physical Exam HENT:     Head: Normocephalic.  Eyes:     General: Lids are normal.     Conjunctiva/sclera: Conjunctivae normal.  Cardiovascular:     Rate and Rhythm: Normal rate and regular rhythm.     Heart sounds: Normal heart sounds, S1 normal and S2  normal.  Pulmonary:     Breath sounds: No decreased breath sounds, wheezing, rhonchi or rales.  Abdominal:     Palpations: Abdomen is soft.     Tenderness: There is abdominal tenderness.     Comments: Swelling right abdomen  Musculoskeletal:     Right lower leg: Swelling present.     Left lower leg: Swelling present.  Skin:    General: Skin is warm.     Findings: No rash.  Neurological:     Mental Status: He is alert and oriented to person, place, and time.      Condition at discharge: stable  The results of significant diagnostics from this hospitalization (including imaging, microbiology, ancillary and laboratory) are listed below for reference.   Imaging Studies: ECHOCARDIOGRAM COMPLETE Result Date: 09/14/2023    ECHOCARDIOGRAM REPORT   Patient Name:   LEMUEL BOODRAM Date of Exam: 09/14/2023 Medical Rec #:  [id number]     Height:       65.0 in Accession #:    [id number]    Weight:       215.2 lb Date of Birth:  [birth year]     BSA:          2.041 m Patient Age:    83 years      BP:           121/59 mmHg Patient Gender: M             HR:           82 bpm. Exam Location:  [facility]  Procedure: 2D Echo, Cardiac Doppler, Color Doppler and Intracardiac            Opacification Agent (Both Spectral and Color Flow Doppler were            utilized during procedure). Indications:     Elevated Troponin  History:         Patient has prior history of Echocardiogram examinations, most                  recent 02/28/2023.  Sonographer:     Thedora Louder RDCS, FASE Referring Phys:  [id number] DARRIN BARN Diagnosing Phys: Redell Cave MD  Sonographer Comments: Technically difficult study due to poor echo windows, suboptimal parasternal window, suboptimal apical window and no subcostal window. Image acquisition challenging due to patient body habitus and Image acquisition challenging due to COPD. IMPRESSIONS  1. Left ventricular ejection fraction, by estimation, is 70 to 75%. The left ventricle has hyperdynamic function. The left ventricle has no regional wall motion abnormalities. There is moderate left ventricular hypertrophy of the septal segment. Left ventricular diastolic parameters are consistent with Grade I diastolic dysfunction (impaired relaxation).  2. Right ventricular systolic function is normal. The right ventricular size is normal.  3. The mitral valve is normal in structure. Mild mitral valve regurgitation.  4. The aortic valve is calcified. Aortic valve regurgitation is not visualized. Aortic valve sclerosis/calcification is present, without any evidence of aortic stenosis. FINDINGS  Left Ventricle: Left ventricular ejection fraction, by estimation, is 70 to 75%. The left ventricle has hyperdynamic function. The left ventricle has no regional wall motion abnormalities. Definity  contrast agent was given IV to delineate the left ventricular endocardial borders. The left ventricular internal cavity size was normal in size. There is moderate left ventricular hypertrophy of the septal segment. Left ventricular diastolic parameters are consistent with Grade I diastolic dysfunction  (impaired relaxation). Right Ventricle: The right ventricular size is normal. No  increase in right ventricular wall thickness. Right ventricular systolic function is normal. Left Atrium: Left atrial size was normal in size. Right Atrium: Right atrial size was normal in size. Pericardium: There is no evidence of pericardial effusion. Mitral Valve: The mitral valve is normal in structure. Mild mitral valve regurgitation. Tricuspid Valve: The tricuspid valve is not well visualized. Tricuspid valve regurgitation is not demonstrated. Aortic Valve: The aortic valve is calcified. Aortic valve regurgitation is not visualized. Aortic valve sclerosis/calcification is present, without any evidence of aortic stenosis. Aortic valve peak gradient measures 8.3 mmHg. Pulmonic Valve: The pulmonic valve was not well visualized. Pulmonic valve regurgitation is not visualized. Aorta: The aortic root is normal in size and structure. Venous: The inferior vena cava was not well visualized. IAS/Shunts: No atrial level shunt detected by color flow Doppler.  LEFT VENTRICLE PLAX 2D LVIDd:         3.20 cm   Diastology LVIDs:         2.00 cm   LV e' medial:    6.96 cm/s LV PW:         1.50 cm   LV E/e' medial:  13.4 LV IVS:        1.60 cm   LV e' lateral:   6.42 cm/s LVOT diam:     2.00 cm   LV E/e' lateral: 14.5 LVOT Area:     3.14 cm  LEFT ATRIUM             Index LA diam:        2.90 cm 1.42 cm/m LA Vol (A2C):   45.5 ml 22.30 ml/m LA Vol (A4C):   55.6 ml 27.25 ml/m LA Biplane Vol: 50.7 ml 24.85 ml/m  AORTIC VALVE              PULMONIC VALVE AV Vmax:      144.00 cm/s PV Vmax:        1.24 m/s AV Peak Grad: 8.3 mmHg    PV Peak grad:   6.2 mmHg                           RVOT Peak grad: 4 mmHg AORTA Ao Root diam: 3.70 cm Ao Asc diam:  2.90 cm MITRAL VALVE MV Area (PHT): 1.92 cm     SHUNTS MV Decel Time: 396 msec     Systemic Diam: 2.00 cm MV E velocity: 93.20 cm/s MV A velocity: 127.00 cm/s MV E/A ratio:  0.73 Redell Cave MD  Electronically signed by Redell Cave MD Signature Date/Time: 09/14/2023/11:16:35 AM    Final    CT Angio Chest/Abd/Pel for Dissection W and/or Wo Contrast Result Date: 09/13/2023 CLINICAL DATA:  Bilateral lower extremity numbness, diaphoresis, recent diarrhea, vomiting EXAM: CT ANGIOGRAPHY CHEST, ABDOMEN AND PELVIS TECHNIQUE: Non-contrast CT of the chest was initially obtained. Multidetector CT imaging through the chest, abdomen and pelvis was performed using the standard protocol during bolus administration of intravenous contrast. Multiplanar reconstructed images and MIPs were obtained and reviewed to evaluate the vascular anatomy. RADIATION DOSE REDUCTION: This exam was performed according to the departmental dose-optimization program which includes automated exposure control, adjustment of the mA and/or kV according to patient size and/or use of iterative reconstruction technique. CONTRAST:  75mL OMNIPAQUE  IOHEXOL  350 MG/ML SOLN COMPARISON:  09/13/2023, 11/20/2018 FINDINGS: CTA CHEST FINDINGS Cardiovascular: The thoracic aorta is unremarkable without evidence of aneurysm or dissection. Atherosclerosis at the aortic arch and throughout the visualized great vessels,  without evidence of critical stenosis. There is limited opacification of the pulmonary vasculature, limiting evaluation for underlying pulmonary emboli. There are no large central or proximal segmental filling defects identified. The heart is unremarkable without pericardial effusion. There is 3 vessel coronary atherosclerosis, most pronounced within the proximal LAD and circumflex distributions. Mediastinum/Nodes: No enlarged mediastinal, hilar, or axillary lymph nodes. Thyroid  gland, trachea, and esophagus demonstrate no significant findings. Lungs/Pleura: No acute airspace disease, effusion, or pneumothorax. The central airways are patent. Musculoskeletal: No acute or destructive bony abnormalities. Reconstructed images demonstrate no  additional findings. Review of the MIP images confirms the above findings. CTA ABDOMEN AND PELVIS FINDINGS VASCULAR Aorta: Normal caliber aorta without aneurysm, dissection, vasculitis or significant stenosis. Diffuse atherosclerosis. Celiac: There is moderate stenosis at the origin of the celiac artery, estimated 50-70%. There is poststenotic dilatation of the celiac artery without frank aneurysm or dissection. No evidence of vasculitis. SMA: Patent without evidence of aneurysm, dissection, vasculitis or significant stenosis. Renals: Both renal arteries are patent without evidence of aneurysm, dissection, vasculitis, fibromuscular dysplasia or significant stenosis. IMA: Patent without evidence of aneurysm, dissection, vasculitis or significant stenosis. Inflow: Patent without evidence of aneurysm, dissection, vasculitis or significant stenosis. Diffuse atherosclerosis. Proximal outflow: Evaluation limited due to timing of contrast bolus. Veins: No obvious venous abnormality within the limitations of this arterial phase study. Review of the MIP images confirms the above findings. NON-VASCULAR Hepatobiliary: No focal liver abnormality is seen. No gallstones, gallbladder wall thickening, or biliary dilatation. Pancreas: Unremarkable. No pancreatic ductal dilatation or surrounding inflammatory changes. Spleen: Normal in size without focal abnormality. Adrenals/Urinary Tract: Bilateral renal cortical atrophy. There are simple appearing bilateral renal cortical and peripelvic cysts which do not require specific imaging follow-up. No urinary tract calculi or obstructive uropathy within either kidney. The adrenals are unremarkable. The bladder is decompressed, limiting its evaluation. Portions of the right superior aspect of the bladder extend into the proximal extent of a right inguinal hernia. Stomach/Bowel: No bowel obstruction or ileus. Scattered colonic diverticulosis without evidence of acute diverticulitis. Normal  appendix right lower quadrant. No bowel wall thickening or inflammatory change. Lymphatic: No pathologic adenopathy within the abdomen or pelvis. Reproductive: Prostate is unremarkable. Other: No free fluid or free intraperitoneal gas. There is a small right inguinal hernia containing a portion of the right superior aspect of the bladder dome as well as peritoneal fat. No bowel herniation or evidence of incarceration. Musculoskeletal: There is heterogeneous asymmetric enlargement of the right rectus musculature most consistent with rectus sheath hematoma. This measures approximately 4.9 x 13.2 cm in transverse dimension, and extends approximately 17.5 cm in craniocaudal length. I do not see any active contrast extravasation. There is mild nonspecific asymmetric enlargement of the right posterolateral oblique musculature, reference image 176/8, which could reflect additional focus of intramuscular hematoma. Again, no active contrast extravasation. There is subcutaneous edema within the right flank. No retroperitoneal hematoma. There are no acute or destructive bony abnormalities. Severe bilateral hip osteoarthritis, right greater than left. Reconstructed images demonstrate no additional findings. Review of the MIP images confirms the above findings. IMPRESSION: Vascular: 1. No evidence of thoracoabdominal aortic aneurysm or dissection. 2. Aortic Atherosclerosis (ICD10-I70.0). Coronary artery atherosclerosis. 3. 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 Nonvascular: 1. Right rectus hematoma, measuring 4.9 x 13.2 cm in transverse direction and extending 17.5 cm in craniocaudal length. No active contrast extravasation. 2. Mild asymmetric enlargement of the right posterolateral oblique musculature within the abdominal wall, which could reflect additional focus of  intramuscular hemorrhage. 3. Nonspecific subcutaneous edema within the right flank, please correlate with any history of trauma. 4.  Colonic diverticulosis without diverticulitis. 5. Right inguinal hernia containing mesenteric fat and a small portion of the right superior aspect of the bladder. No bowel herniation or incarceration. 6. Severe bilateral hip osteoarthritis, right greater than left. Critical Value/emergent results were called by telephone at the time of interpretation on 09/13/2023 at 3:29 pm to provider [facility] , who verbally acknowledged these results. Electronically Signed   By: Ozell Daring M.D.   On: 09/13/2023 15:33   DG Chest Port 1 View Result Date: 09/13/2023 CLINICAL DATA:  Sepsis EXAM: PORTABLE CHEST 1 VIEW COMPARISON:  Chest radiograph dated 04/27/2022 FINDINGS: Normal lung volumes. No focal consolidations. No pleural effusion or pneumothorax. The heart size and mediastinal contours are within normal limits. Cervical spinal fixation hardware appears intact. Degenerative changes of bilateral shoulders. IMPRESSION: No focal consolidations. Electronically Signed   By: Limin  Xu M.D.   On: 09/13/2023 13:48    Microbiology: Results for orders placed or performed during the hospital encounter of 09/13/23  Blood Culture (routine x 2)     Status: None (Preliminary result)   Collection Time: 09/13/23 12:58 PM   Specimen: BLOOD RIGHT ARM  Result Value Ref Range Status   Specimen Description   Final    BLOOD RIGHT ARM Performed at [facility], [address] [place], [place] [address]    Special Requests   Final    BOTTLES DRAWN AEROBIC AND ANAEROBIC Blood Culture results may not be optimal due to an inadequate volume of blood received in culture bottles   Culture   Final    NO GROWTH 2 DAYS Performed at [facility], [address] [place], [place] [address]    Report Status PENDING  Incomplete  Blood Culture (routine x 2)     Status: None (Preliminary result)   Collection Time: 09/13/23  1:00 PM   Specimen: BLOOD LEFT ARM  Result Value Ref Range Status   Specimen Description BLOOD  LEFT ARM  Final   Special Requests   Final    BOTTLES DRAWN AEROBIC AND ANAEROBIC Blood Culture adequate volume   Culture   Final    NO GROWTH 2 DAYS Performed at [facility], [address], [place], [place] [address]    Report Status PENDING  Incomplete  MRSA Next Gen by PCR, Nasal     Status: None   Collection Time: 09/13/23  5:01 PM   Specimen: Nasal Mucosa; Nasal Swab  Result Value Ref Range Status   MRSA by PCR Next Gen NOT DETECTED NOT DETECTED Final    Comment: (NOTE) The GeneXpert MRSA Assay (FDA approved for NASAL specimens only), is one component of a comprehensive MRSA colonization surveillance program. It is not intended to diagnose MRSA infection nor to guide or monitor treatment for MRSA infections. Test performance is not FDA approved in patients less than 3 years old. Performed at [facility], [address] [place], [place] [address]   Expectorated Sputum Assessment w Gram Stain, Rflx to Resp Cult     Status: None   Collection Time: 09/15/23  2:20 AM   Specimen: Sputum  Result Value Ref Range Status   Specimen Description SPUTUM  Final   Special Requests Normal  Final   Sputum evaluation   Final    Sputum specimen not acceptable for testing.  Please recollect.   informed ALMARIE IRELAND, LPN @[id number]   09/15/2023 COP  Performed at [facility], [address] [place], [place] [address]    Report Status 09/15/2023 FINAL  Final    Labs: CBC: Recent Labs  Lab 09/13/23 1259 09/14/23 0606 09/15/23 0527 09/16/23 0529  WBC 15.4* 10.4 10.2 10.6*  NEUTROABS 11.3*  --   --   --   HGB 12.8* 10.6* 11.5* 12.2*  HCT 38.6* 32.6* 34.9* 36.9*  MCV 103.2* 102.5* 102.6* 101.4*  PLT 249 191 201 230   Basic Metabolic Panel: Recent Labs  Lab 09/13/23 1259 09/14/23 0606 09/15/23 0527  NA 137 138 138  K 4.5 3.9 3.8  CL 103 106 104  CO2 22 24 27   GLUCOSE 164* 85 89  BUN 37* 29* 26*  CREATININE 1.94* 1.25* 1.22  CALCIUM  8.8* 8.1*  8.5*  MG 2.3 1.9  --   PHOS  --  2.5  --    Liver Function Tests: Recent Labs  Lab 09/13/23 1259 09/14/23 0606  AST 41 29  ALT 78* 55*  ALKPHOS 42 34*  BILITOT 1.9* 1.0  PROT 6.4* 5.0*  ALBUMIN  3.4* 2.6*   CBG: Recent Labs  Lab 09/13/23 1945 09/14/23 0740  GLUCAP 137* 91    Discharge time spent: greater than 30 minutes.  Signed: Charlie Patterson, MD [facility] 09/16/2023

## 2023-09-16 NOTE — Progress Notes (Signed)
   09/16/23 1530  Spiritual Encounters  Type of Visit Follow up  Care provided to: Patient  Conversation partners present during encounter Nurse  Referral source Nurse (RN/NT/LPN)  Reason for visit Advance directives  OnCall Visit No  Spiritual Framework  Presenting Themes Goals in life/care  Interventions  Spiritual Care Interventions Made Decision-making support/facilitation  Intervention Outcomes  Outcomes Connection to spiritual care;Awareness around self/spiritual resourses;Awareness of health  Advance Directives (For Healthcare)  Does Patient Have a Medical Advance Directive? Yes  Type of Estate agent of [place];Living will (Original given to Pt along w/copies for Son; 1 copy is in his paper chart; 1 copy was emailed to [email or web address])

## 2023-09-16 NOTE — Progress Notes (Signed)
 PT Cancellation Note  Patient Details Name: Bobby Lane MRN: [id number] DOB: 05-05-1940   Cancelled Treatment:    Reason Eval/Treat Not Completed: Other (comment). Pt received sitting up in bed, waiting for breakfast. Pt declining participation at this time, as he would like to eat before working with therapy. Will follow up as appropriate.   Camie CHARLENA Kluver, PT, DPT 9:34 AM,09/16/23 Physical Therapist - [place] [facility]

## 2023-09-16 NOTE — Plan of Care (Signed)
 The patient  is alert and oriented x 4. No s/s of acute distress noted.

## 2023-09-18 DIAGNOSIS — E785 Hyperlipidemia, unspecified: Secondary | ICD-10-CM | POA: Diagnosis not present

## 2023-09-18 DIAGNOSIS — G959 Disease of spinal cord, unspecified: Secondary | ICD-10-CM | POA: Diagnosis not present

## 2023-09-18 DIAGNOSIS — I70219 Atherosclerosis of native arteries of extremities with intermittent claudication, unspecified extremity: Secondary | ICD-10-CM | POA: Diagnosis not present

## 2023-09-18 DIAGNOSIS — S301XXD Contusion of abdominal wall, subsequent encounter: Secondary | ICD-10-CM | POA: Diagnosis not present

## 2023-09-18 DIAGNOSIS — I130 Hypertensive heart and chronic kidney disease with heart failure and stage 1 through stage 4 chronic kidney disease, or unspecified chronic kidney disease: Secondary | ICD-10-CM | POA: Diagnosis not present

## 2023-09-18 DIAGNOSIS — I051 Rheumatic mitral insufficiency: Secondary | ICD-10-CM | POA: Diagnosis not present

## 2023-09-18 DIAGNOSIS — N179 Acute kidney failure, unspecified: Secondary | ICD-10-CM | POA: Diagnosis not present

## 2023-09-18 DIAGNOSIS — I471 Supraventricular tachycardia, unspecified: Secondary | ICD-10-CM | POA: Diagnosis not present

## 2023-09-18 DIAGNOSIS — N189 Chronic kidney disease, unspecified: Secondary | ICD-10-CM | POA: Diagnosis not present

## 2023-09-18 DIAGNOSIS — J449 Chronic obstructive pulmonary disease, unspecified: Secondary | ICD-10-CM | POA: Diagnosis not present

## 2023-09-18 DIAGNOSIS — I2510 Atherosclerotic heart disease of native coronary artery without angina pectoris: Secondary | ICD-10-CM | POA: Diagnosis not present

## 2023-09-18 DIAGNOSIS — E039 Hypothyroidism, unspecified: Secondary | ICD-10-CM | POA: Diagnosis not present

## 2023-09-18 DIAGNOSIS — I5032 Chronic diastolic (congestive) heart failure: Secondary | ICD-10-CM | POA: Diagnosis not present

## 2023-09-18 LAB — CULTURE, BLOOD (ROUTINE X 2)
Culture: NO GROWTH
Culture: NO GROWTH
Special Requests: ADEQUATE

## 2023-09-18 LAB — GLUCOSE, CAPILLARY: Glucose-Capillary: 127 mg/dL — ABNORMAL HIGH (ref 70–99)

## 2023-09-20 DIAGNOSIS — J449 Chronic obstructive pulmonary disease, unspecified: Secondary | ICD-10-CM | POA: Diagnosis not present

## 2023-09-20 DIAGNOSIS — G959 Disease of spinal cord, unspecified: Secondary | ICD-10-CM | POA: Diagnosis not present

## 2023-09-20 DIAGNOSIS — I2510 Atherosclerotic heart disease of native coronary artery without angina pectoris: Secondary | ICD-10-CM | POA: Diagnosis not present

## 2023-09-20 DIAGNOSIS — I130 Hypertensive heart and chronic kidney disease with heart failure and stage 1 through stage 4 chronic kidney disease, or unspecified chronic kidney disease: Secondary | ICD-10-CM | POA: Diagnosis not present

## 2023-09-20 DIAGNOSIS — S301XXD Contusion of abdominal wall, subsequent encounter: Secondary | ICD-10-CM | POA: Diagnosis not present

## 2023-09-20 DIAGNOSIS — I471 Supraventricular tachycardia, unspecified: Secondary | ICD-10-CM | POA: Diagnosis not present

## 2023-09-20 DIAGNOSIS — I051 Rheumatic mitral insufficiency: Secondary | ICD-10-CM | POA: Diagnosis not present

## 2023-09-20 DIAGNOSIS — N189 Chronic kidney disease, unspecified: Secondary | ICD-10-CM | POA: Diagnosis not present

## 2023-09-20 DIAGNOSIS — N179 Acute kidney failure, unspecified: Secondary | ICD-10-CM | POA: Diagnosis not present

## 2023-09-20 DIAGNOSIS — I5032 Chronic diastolic (congestive) heart failure: Secondary | ICD-10-CM | POA: Diagnosis not present

## 2023-09-20 DIAGNOSIS — E039 Hypothyroidism, unspecified: Secondary | ICD-10-CM | POA: Diagnosis not present

## 2023-09-20 DIAGNOSIS — E785 Hyperlipidemia, unspecified: Secondary | ICD-10-CM | POA: Diagnosis not present

## 2023-09-20 DIAGNOSIS — I70219 Atherosclerosis of native arteries of extremities with intermittent claudication, unspecified extremity: Secondary | ICD-10-CM | POA: Diagnosis not present

## 2023-09-23 DIAGNOSIS — G959 Disease of spinal cord, unspecified: Secondary | ICD-10-CM | POA: Diagnosis not present

## 2023-09-23 DIAGNOSIS — I5032 Chronic diastolic (congestive) heart failure: Secondary | ICD-10-CM | POA: Diagnosis not present

## 2023-09-23 DIAGNOSIS — S301XXD Contusion of abdominal wall, subsequent encounter: Secondary | ICD-10-CM | POA: Diagnosis not present

## 2023-09-23 DIAGNOSIS — E039 Hypothyroidism, unspecified: Secondary | ICD-10-CM | POA: Diagnosis not present

## 2023-09-23 DIAGNOSIS — I2510 Atherosclerotic heart disease of native coronary artery without angina pectoris: Secondary | ICD-10-CM | POA: Diagnosis not present

## 2023-09-23 DIAGNOSIS — J449 Chronic obstructive pulmonary disease, unspecified: Secondary | ICD-10-CM | POA: Diagnosis not present

## 2023-09-23 DIAGNOSIS — I70219 Atherosclerosis of native arteries of extremities with intermittent claudication, unspecified extremity: Secondary | ICD-10-CM | POA: Diagnosis not present

## 2023-09-23 DIAGNOSIS — N189 Chronic kidney disease, unspecified: Secondary | ICD-10-CM | POA: Diagnosis not present

## 2023-09-23 DIAGNOSIS — I471 Supraventricular tachycardia, unspecified: Secondary | ICD-10-CM | POA: Diagnosis not present

## 2023-09-23 DIAGNOSIS — E785 Hyperlipidemia, unspecified: Secondary | ICD-10-CM | POA: Diagnosis not present

## 2023-09-23 DIAGNOSIS — I130 Hypertensive heart and chronic kidney disease with heart failure and stage 1 through stage 4 chronic kidney disease, or unspecified chronic kidney disease: Secondary | ICD-10-CM | POA: Diagnosis not present

## 2023-09-23 DIAGNOSIS — N179 Acute kidney failure, unspecified: Secondary | ICD-10-CM | POA: Diagnosis not present

## 2023-09-23 DIAGNOSIS — I051 Rheumatic mitral insufficiency: Secondary | ICD-10-CM | POA: Diagnosis not present

## 2023-09-25 ENCOUNTER — Ambulatory Visit: Admitting: Family Medicine

## 2023-09-25 ENCOUNTER — Encounter: Payer: Self-pay | Admitting: Family Medicine

## 2023-09-25 VITALS — BP 142/78 | HR 86 | Resp 16 | Ht 65.0 in | Wt 206.0 lb

## 2023-09-25 DIAGNOSIS — I70213 Atherosclerosis of native arteries of extremities with intermittent claudication, bilateral legs: Secondary | ICD-10-CM | POA: Diagnosis not present

## 2023-09-25 DIAGNOSIS — E039 Hypothyroidism, unspecified: Secondary | ICD-10-CM

## 2023-09-25 DIAGNOSIS — I471 Supraventricular tachycardia, unspecified: Secondary | ICD-10-CM

## 2023-09-25 DIAGNOSIS — N179 Acute kidney failure, unspecified: Secondary | ICD-10-CM

## 2023-09-25 DIAGNOSIS — Z0000 Encounter for general adult medical examination without abnormal findings: Secondary | ICD-10-CM

## 2023-09-25 DIAGNOSIS — I10 Essential (primary) hypertension: Secondary | ICD-10-CM

## 2023-09-25 DIAGNOSIS — I509 Heart failure, unspecified: Secondary | ICD-10-CM

## 2023-09-25 DIAGNOSIS — E785 Hyperlipidemia, unspecified: Secondary | ICD-10-CM

## 2023-09-25 DIAGNOSIS — Z09 Encounter for follow-up examination after completed treatment for conditions other than malignant neoplasm: Secondary | ICD-10-CM | POA: Diagnosis not present

## 2023-09-25 LAB — COMPREHENSIVE METABOLIC PANEL WITH GFR
AG Ratio: 1.8 (calc) (ref 1.0–2.5)
ALT: 51 U/L — ABNORMAL HIGH (ref 9–46)
AST: 30 U/L (ref 10–35)
Albumin: 3.8 g/dL (ref 3.6–5.1)
Alkaline phosphatase (APISO): 58 U/L (ref 35–144)
BUN/Creatinine Ratio: 24 (calc) — ABNORMAL HIGH (ref 6–22)
BUN: 32 mg/dL — ABNORMAL HIGH (ref 7–25)
CO2: 31 mmol/L (ref 20–32)
Calcium: 9.1 mg/dL (ref 8.6–10.3)
Chloride: 104 mmol/L (ref 98–110)
Creat: 1.34 mg/dL — ABNORMAL HIGH (ref 0.70–1.22)
Globulin: 2.1 g/dL (ref 1.9–3.7)
Glucose, Bld: 69 mg/dL (ref 65–99)
Potassium: 4.3 mmol/L (ref 3.5–5.3)
Sodium: 142 mmol/L (ref 135–146)
Total Bilirubin: 0.7 mg/dL (ref 0.2–1.2)
Total Protein: 5.9 g/dL — ABNORMAL LOW (ref 6.1–8.1)
eGFR: 53 mL/min/1.73m2 — ABNORMAL LOW (ref >=60)

## 2023-09-25 LAB — CBC WITH DIFFERENTIAL/PLATELET
Absolute Lymphocytes: 2881 {cells}/uL (ref 850–3900)
Absolute Monocytes: 832 {cells}/uL (ref 200–950)
Basophils Absolute: 21 {cells}/uL (ref 0–200)
Basophils Relative: 0.2 %
Eosinophils Absolute: 135 {cells}/uL (ref 15–500)
Eosinophils Relative: 1.3 %
HCT: 38.1 % — ABNORMAL LOW (ref 38.5–50.0)
Hemoglobin: 12.6 g/dL — ABNORMAL LOW (ref 13.2–17.1)
MCH: 34.8 pg — ABNORMAL HIGH (ref 27.0–33.0)
MCHC: 33.1 g/dL (ref 32.0–36.0)
MCV: 105.2 fL — ABNORMAL HIGH (ref 80.0–100.0)
MPV: 9.2 fL (ref 7.5–12.5)
Monocytes Relative: 8 %
Neutro Abs: 6531 {cells}/uL (ref 1500–7800)
Neutrophils Relative %: 62.8 %
Platelets: 228 Thousand/uL (ref 140–400)
RBC: 3.62 Million/uL — ABNORMAL LOW (ref 4.20–5.80)
RDW: 15.7 % — ABNORMAL HIGH (ref 11.0–15.0)
Total Lymphocyte: 27.7 %
WBC: 10.4 Thousand/uL (ref 3.8–10.8)

## 2023-09-25 LAB — LIPID PANEL
Cholesterol: 173 mg/dL (ref <200)
HDL: 32 mg/dL — ABNORMAL LOW (ref >=40)
LDL Cholesterol (Calc): 90 mg/dL
Non-HDL Cholesterol (Calc): 141 mg/dL — ABNORMAL HIGH (ref <130)
Total CHOL/HDL Ratio: 5.4 (calc) — ABNORMAL HIGH (ref <5.0)
Triglycerides: 386 mg/dL — ABNORMAL HIGH (ref <150)

## 2023-09-25 LAB — TSH: TSH: 0.82 m[IU]/L (ref 0.40–4.50)

## 2023-09-25 NOTE — Progress Notes (Unsigned)
 Name: Bobby Lane   MRN: [id number]    DOB: [birth year]   Date:09/25/2023       Progress Note  Chief Complaint  Patient presents with   Establish Care     Subjective:   Bobby Lane is a 83 y.o. male, presents to clinic for routine to establish care, to get chronic dx managed by new PCP and for HFU  Son manages pt medications - fills up the pill boxes on Sundays once a week - so pt doesn't know his own meds and he is here by himself today   Recent hospital admission at [facility] 09/13/2023-09/16/2023 D/C to home on 09/17/2023 grandson stayed with him for a few days  BP at home has been too high He did restart losartan  at d/c  ECHO reviewed Est with cardiology  Anemia -  Hemoglobin  Date Value Ref Range Status  09/16/2023 12.2 (L) 13.0 - 17.0 g/dL Final  91/96/7974 88.4 (L) 13.0 - 17.0 g/dL Final  91/97/7974 89.3 (L) 13.0 - 17.0 g/dL Final  91/98/7974 87.1 (L) 13.0 - 17.0 g/dL Final  88/78/7975 85.7 13.0 - 17.7 g/dL Final  95/74/7975 85.0 13.0 - 17.7 g/dL Final   HGB  Date Value Ref Range Status  09/02/2013 15.0 13.0 - 18.0 g/dL Final  87/81/7985 85.1 13.0 - 18.0 g/dL Final  91/90/7985 86.1 13.0 - 18.0 g/dL Final  97/91/7985 84.9 13.0 - 18.0 g/dL Final    Hypothyroid 874 levothyroxine  mcg daily no recent labs Lab Results  Component Value Date   TSH 7.580 (H) 06/07/2022   Lab Results  Component Value Date   EGFR 49 (L) 01/03/2023   EGFR 45 (L) 06/07/2022   HLD on crestor  40 and zetia   Lab Results  Component Value Date   CHOL 213 (H) 06/07/2022   HDL 26 (L) 06/07/2022   LDLCALC 137 (H) 06/07/2022   TRIG 273 (H) 06/07/2022   CHOLHDL 8.2 (H) 06/07/2022    Discussed the use of AI scribe software for clinical note transcription with the patient, who gave verbal consent to proceed.  History of Present Illness Bobby Lane is an 83 year old male with a history of heart issues who presents for establishment of care after a recent hospitalization.  Neurological  symptoms - Immobility in feet and legs leading to 911 call and hospitalization - Numbness in mouth at time of event - No current neurological symptoms reported post-discharge  Cardiac symptoms and management - History of extra heartbeat and small heart blockage - On losartan  and carvedilol  for heart and blood pressure management - Previously on Toprol , switched due to insurance issues - No chest pain, palpitations, or shortness of breath - No wheezing - Reports high blood pressure readings at home, unable to recall exact numbers  Abdominal hematoma and pain - Sustained abdominal hematoma after a fall - Sharp abdominal pain, especially when sneezing - Manages pain by pressing on abdomen during sneezes - Hematoma expected to reabsorb over several weeks  Gout - Takes allopurinol  for gout - Gout previously affected feet, legs, and once the neck - No recent gout flares  Medication management - Current medications: vitamin B12, allopurinol , vitamin D3, losartan , carvedilol  - Grandson manages medications and fills pill box weekly      Current Outpatient Medications:    acetaminophen  (TYLENOL ) 650 MG CR tablet, Take 1,300 mg by mouth every 8 (eight) hours as needed for pain., Disp: , Rfl:    albuterol  (VENTOLIN  HFA) 108 (90 Base) MCG/ACT  inhaler, Inhale 2 puffs into the lungs every 6 (six) hours as needed for wheezing or shortness of breath., Disp: 18 g, Rfl: 0   allopurinol  (ZYLOPRIM ) 300 MG tablet, Take 1 tablet by mouth once daily, Disp: 30 tablet, Rfl: 0   carvedilol  (COREG ) 6.25 MG tablet, Take 1 tablet (6.25 mg total) by mouth 2 (two) times daily with a meal., Disp: 60 tablet, Rfl: 0   Cholecalciferol  (VITAMIN D3) 5000 UNITS TABS, Take 5,000 Units by mouth daily., Disp: , Rfl:    cyanocobalamin  1000 MCG tablet, Take 1,000 mcg by mouth daily., Disp: , Rfl:    DULoxetine  (CYMBALTA ) 30 MG capsule, Take 1 capsule by mouth twice daily, Disp: 60 capsule, Rfl: 0   ezetimibe  (ZETIA ) 10  MG tablet, Take 1 tablet (10 mg total) by mouth daily., Disp: 90 tablet, Rfl: 3   GEMTESA  75 MG TABS, Take 1 tablet (75 mg total) by mouth daily., Disp: 90 tablet, Rfl: 1   levothyroxine  (SYNTHROID ) 125 MCG tablet, Take 1 tablet (125 mcg total) by mouth daily., Disp: 90 tablet, Rfl: 3   losartan  (COZAAR ) 50 MG tablet, Take 1 tablet (50 mg total) by mouth daily., Disp: 90 tablet, Rfl: 3   polyethylene glycol (MIRALAX  / GLYCOLAX ) 17 g packet, Take 17 g by mouth daily as needed for moderate constipation., Disp: 30 each, Rfl: 0   rosuvastatin  (CRESTOR ) 40 MG tablet, Take 40 mg by mouth daily., Disp: , Rfl:   Patient Active Problem List   Diagnosis Date Noted   Hematoma 09/14/2023   Vitamin B12 deficiency 09/14/2023   Obesity (BMI 30-39.9) 09/14/2023   Cough 09/14/2023   Acute kidney injury superimposed on CKD (HCC) 09/14/2023   Lactic acid acidosis 09/14/2023   Shock (HCC) 09/13/2023   Ingrown toenail 07/26/2022   Paroxysmal SVT (supraventricular tachycardia) (HCC) 06/07/2022   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 difficile diarrhea 12/05/2018   Pressure injury of skin 11/20/2018   Pain due to onychomycosis of toenails of both feet 08/18/2018   Other dysphagia 04/12/2016   Hypertensive renal disease    Gout    Coronary artery disease    Thoracic and lumbosacral neuritis 01/13/2013   Actinic keratosis 03/13/2012   Disease of sebaceous glands 03/13/2012   Seborrheic dermatitis of scalp 03/13/2012   Enlarged prostate with lower urinary tract symptoms (LUTS) 07/12/2011    Past  Surgical History:  Procedure Laterality Date   ANTERIOR CERVICAL DECOMP/DISCECTOMY FUSION N/A 07/16/2019   Procedure: Cervical three-four Cervical five-six Cervical six-seven Anterior cervical decompression/discectomy/fusion;  Surgeon: Unice Pac, MD;  Location: [facility] OR;  Service: Neurosurgery;  Laterality: N/A;  3C   CARDIAC CATHETERIZATION     CARPAL TUNNEL RELEASE Left 07/16/2019   Procedure: LEFT CARPAL TUNNEL RELEASE;  Surgeon: Unice Pac, MD;  Location: [facility] OR;  Service: Neurosurgery;  Laterality: Left;   CATARACT EXTRACTION W/PHACO Right 08/21/2016   Procedure: CATARACT EXTRACTION PHACO AND INTRAOCULAR LENS PLACEMENT (IOC);  Surgeon: Jaye Fallow, MD;  Location: [facility] ORS;  Service: Ophthalmology;  Laterality: Right;  US  00:54.9 AP% 18.3 CDE 10.04 Fluid pack lot # [id number]   CATARACT EXTRACTION W/PHACO Left 09/18/2016   Procedure: CATARACT  EXTRACTION PHACO AND INTRAOCULAR LENS PLACEMENT (IOC);  Surgeon: Jaye Fallow, MD;  Location: [facility] ORS;  Service: Ophthalmology;  Laterality: Left;  US  00:56 AP% 19.7 CDE 11.07 Fluid pack lot # [id number]   FRACTURE SURGERY     ANKLE   HERNIA REPAIR     UPPER GI ENDOSCOPY      Family History  Problem Relation Age of Onset   Brain cancer Father    Cancer Paternal Uncle    Prostate cancer Neg Hx    Bladder Cancer Neg Hx    Kidney cancer Neg Hx     Social History   Tobacco Use   Smoking status: Never   Smokeless tobacco: Never  Vaping Use   Vaping status: Never Used  Substance Use Topics   Alcohol use: No   Drug use: No     Allergies  Allergen Reactions   Codeine Nausea And Vomiting   Morphine And Codeine Nausea And Vomiting   Novocain [Procaine] Hives    NO TROUBLE IN OR   Other Nausea And Vomiting   Percocet [Oxycodone-Acetaminophen ] Nausea And Vomiting   Sulfa Antibiotics Hives   Acetaminophen     Atorvastatin    Tramadol  Nausea And Vomiting    Note: upset stomach Note: upset stomach     Health Maintenance   Topic Date Due   Pneumococcal Vaccine: 50+ Years (1 of 2 - PCV) Never done   Medicare Annual Wellness (AWV)  07/23/2023   COVID-19 Vaccine (4 - 2024-25 season) 10/11/2023 (Originally 10/14/2022)   Zoster Vaccines- Shingrix (1 of 2) 12/26/2023 (Originally 05/24/1959)   INFLUENZA VACCINE  05/12/2024 (Originally 09/13/2023)   DTaP/Tdap/Td (2 - Td or Tdap) 11/13/2027   Hepatitis B Vaccines  Aged Out   HPV VACCINES  Aged Out   Meningococcal B Vaccine  Aged Out    Chart Review Today: I personally reviewed active problem list, medication list, allergies, family history, social history, health maintenance, notes from last encounter, lab results, imaging with the patient/caregiver today.   Review of Systems  Constitutional: Negative.   HENT: Negative.    Eyes: Negative.   Respiratory: Negative.    Cardiovascular: Negative.   Gastrointestinal: Negative.   Endocrine: Negative.   Genitourinary: Negative.   Musculoskeletal: Negative.   Skin: Negative.   Allergic/Immunologic: Negative.   Neurological: Negative.   Hematological: Negative.   Psychiatric/Behavioral: Negative.    All other systems reviewed and are negative.    Objective:   Vitals:   09/25/23 1433  BP: (!) 142/78  Pulse: 86  Resp: 16  SpO2: 98%  Weight: 206 lb (93.4 kg)  Height: 5' 5 (1.651 m)    Body mass index is 34.28 kg/m.  Physical Exam Vitals and nursing note reviewed.  Constitutional:      General: He is not in acute distress.    Appearance: He is obese. He is not toxic-appearing or diaphoretic.  HENT:     Head: Normocephalic and atraumatic.     Right Ear: External ear normal.     Left Ear: External ear normal.     Nose: Nose normal.     Mouth/Throat:     Mouth: Mucous membranes are moist.  Eyes:     General:        Right eye: No discharge.        Left eye: No discharge.     Conjunctiva/sclera: Conjunctivae normal.  Cardiovascular:     Rate and Rhythm: Normal rate and regular rhythm.     Pulses:  Normal pulses.     Heart sounds: Normal heart sounds.  Pulmonary:     Effort: Pulmonary effort is normal.     Breath sounds: Normal breath sounds.  Abdominal:     General: Bowel sounds are normal. There is no distension.     Palpations: Abdomen is soft.     Tenderness: There is abdominal tenderness. There is no guarding or rebound.     Comments: Obese abd, protuberant, bruising healing to midright side of anterior abd  Neurological:     Gait: Gait abnormal (in WC throughout encounter).      Functional Status Survey: Is the patient deaf or have difficulty hearing?: Yes Does the patient have difficulty seeing, even when wearing glasses/contacts?: Yes Does the patient have difficulty concentrating, remembering, or making decisions?: Yes Does the patient have difficulty walking or climbing stairs?: Yes Does the patient have difficulty dressing or bathing?: Yes Does the patient have difficulty doing errands alone such as visiting a doctor's office or shopping?: Yes Results for orders placed or performed during the hospital encounter of 09/13/23  Blood Culture (routine x 2)   Collection Time: 09/13/23 12:58 PM   Specimen: BLOOD RIGHT ARM  Result Value Ref Range   Specimen Description      BLOOD RIGHT ARM Performed at [facility], [address], [place], [place] [address]    Special Requests      BOTTLES DRAWN AEROBIC AND ANAEROBIC Blood Culture results may not be optimal due to an inadequate volume of blood received in culture bottles   Culture      NO GROWTH 5 DAYS Performed at [facility], [address] [place], [place] [address]    Report Status 09/18/2023 FINAL   Lactic acid, plasma   Collection Time: 09/13/23 12:59 PM  Result Value Ref Range   Lactic Acid, Venous 3.0 (HH) 0.5 - 1.9 mmol/L  Comprehensive metabolic panel   Collection Time: 09/13/23 12:59 PM  Result Value Ref Range   Sodium 137 135 - 145 mmol/L   Potassium 4.5 3.5 - 5.1 mmol/L    Chloride 103 98 - 111 mmol/L   CO2 22 22 - 32 mmol/L   Glucose, Bld 164 (H) 70 - 99 mg/dL   BUN 37 (H) 8 - 23 mg/dL   Creatinine, Ser 8.05 (H) 0.61 - 1.24 mg/dL   Calcium  8.8 (L) 8.9 - 10.3 mg/dL   Total Protein 6.4 (L) 6.5 - 8.1 g/dL   Albumin  3.4 (L) 3.5 - 5.0 g/dL   AST 41 15 - 41 U/L   ALT 78 (H) 0 - 44 U/L   Alkaline Phosphatase 42 38 - 126 U/L   Total Bilirubin 1.9 (H) 0.0 - 1.2 mg/dL   GFR, Estimated 34 (L) >60 mL/min   Anion gap 12 5 - 15  CBC with Differential   Collection Time: 09/13/23 12:59 PM  Result Value Ref Range   WBC 15.4 (H) 4.0 - 10.5 K/uL   RBC 3.74 (L) 4.22 - 5.81 MIL/uL   Hemoglobin 12.8 (L) 13.0 - 17.0 g/dL   HCT 61.3 (L) 60.9 - 47.9 %   MCV 103.2 (H) 80.0 - 100.0 fL   MCH 34.2 (H) 26.0 - 34.0 pg   MCHC 33.2 30.0 - 36.0 g/dL   RDW 84.4 88.4 - 84.4 %   Platelets 249 150 - 400 K/uL   nRBC 0.3 (H) 0.0 - 0.2 %   Neutrophils Relative % 73 %   Neutro Abs 11.3 (H)  1.7 - 7.7 K/uL   Lymphocytes Relative 17 %   Lymphs Abs 2.6 0.7 - 4.0 K/uL   Monocytes Relative 8 %   Monocytes Absolute 1.2 (H) 0.1 - 1.0 K/uL   Eosinophils Relative 0 %   Eosinophils Absolute 0.1 0.0 - 0.5 K/uL   Basophils Relative 0 %   Basophils Absolute 0.0 0.0 - 0.1 K/uL   Immature Granulocytes 2 %   Abs Immature Granulocytes 0.34 (H) 0.00 - 0.07 K/uL  Protime-INR   Collection Time: 09/13/23 12:59 PM  Result Value Ref Range   Prothrombin Time 15.0 11.4 - 15.2 seconds   INR 1.1 0.8 - 1.2  Lipase, blood   Collection Time: 09/13/23 12:59 PM  Result Value Ref Range   Lipase 25 11 - 51 U/L  Brain natriuretic peptide   Collection Time: 09/13/23 12:59 PM  Result Value Ref Range   B Natriuretic Peptide 184.4 (H) 0.0 - 100.0 pg/mL  APTT   Collection Time: 09/13/23 12:59 PM  Result Value Ref Range   aPTT 30 24 - 36 seconds  Magnesium   Collection Time: 09/13/23 12:59 PM  Result Value Ref Range   Magnesium 2.3 1.7 - 2.4 mg/dL  Troponin I (High Sensitivity)   Collection Time: 09/13/23  12:59 PM  Result Value Ref Range   Troponin I (High Sensitivity) 67 (H) <18 ng/L  Blood Culture (routine x 2)   Collection Time: 09/13/23  1:00 PM   Specimen: BLOOD LEFT ARM  Result Value Ref Range   Specimen Description BLOOD LEFT ARM    Special Requests      BOTTLES DRAWN AEROBIC AND ANAEROBIC Blood Culture adequate volume   Culture      NO GROWTH 5 DAYS Performed at [facility], [address] [place], [place] [address]    Report Status 09/18/2023 FINAL   CK   Collection Time: 09/13/23  1:12 PM  Result Value Ref Range   Total CK 79 49 - 397 U/L  Glucose, capillary   Collection Time: 09/13/23  4:54 PM  Result Value Ref Range   Glucose-Capillary 127 (H) 70 - 99 mg/dL  MRSA Next Gen by PCR, Nasal   Collection Time: 09/13/23  5:01 PM   Specimen: Nasal Mucosa; Nasal Swab  Result Value Ref Range   MRSA by PCR Next Gen NOT DETECTED NOT DETECTED  Lactic acid, plasma   Collection Time: 09/13/23  5:28 PM  Result Value Ref Range   Lactic Acid, Venous 2.0 (HH) 0.5 - 1.9 mmol/L  Troponin I (High Sensitivity)   Collection Time: 09/13/23  5:28 PM  Result Value Ref Range   Troponin I (High Sensitivity) 42 (H) <18 ng/L  Lactic acid, plasma   Collection Time: 09/13/23  6:49 PM  Result Value Ref Range   Lactic Acid, Venous 2.1 (HH) 0.5 - 1.9 mmol/L  Troponin I (High Sensitivity)   Collection Time: 09/13/23  6:49 PM  Result Value Ref Range   Troponin I (High Sensitivity) 41 (H) <18 ng/L  Glucose, capillary   Collection Time: 09/13/23  7:45 PM  Result Value Ref Range   Glucose-Capillary 137 (H) 70 - 99 mg/dL  Lactic acid, plasma   Collection Time: 09/13/23 10:17 PM  Result Value Ref Range   Lactic Acid, Venous 1.4 0.5 - 1.9 mmol/L  Urinalysis, w/ Reflex to Culture (Infection Suspected) -Urine, Clean Catch   Collection Time: 09/13/23 10:47 PM  Result Value Ref Range   Specimen Source URINE, CLEAN CATCH  Color, Urine YELLOW (A) YELLOW   APPearance CLEAR (A) CLEAR    Specific Gravity, Urine >1.046 (H) 1.005 - 1.030   pH 5.0 5.0 - 8.0   Glucose, UA NEGATIVE NEGATIVE mg/dL   Hgb urine dipstick NEGATIVE NEGATIVE   Bilirubin Urine NEGATIVE NEGATIVE   Ketones, ur NEGATIVE NEGATIVE mg/dL   Protein, ur NEGATIVE NEGATIVE mg/dL   Nitrite NEGATIVE NEGATIVE   Leukocytes,Ua NEGATIVE NEGATIVE   RBC / HPF 0-5 0 - 5 RBC/hpf   WBC, UA 0-5 0 - 5 WBC/hpf   Bacteria, UA NONE SEEN NONE SEEN   Squamous Epithelial / HPF 0-5 0 - 5 /HPF  Procalcitonin   Collection Time: 09/14/23  6:06 AM  Result Value Ref Range   Procalcitonin <0.10 ng/mL  Comprehensive metabolic panel with GFR   Collection Time: 09/14/23  6:06 AM  Result Value Ref Range   Sodium 138 135 - 145 mmol/L   Potassium 3.9 3.5 - 5.1 mmol/L   Chloride 106 98 - 111 mmol/L   CO2 24 22 - 32 mmol/L   Glucose, Bld 85 70 - 99 mg/dL   BUN 29 (H) 8 - 23 mg/dL   Creatinine, Ser 8.74 (H) 0.61 - 1.24 mg/dL   Calcium  8.1 (L) 8.9 - 10.3 mg/dL   Total Protein 5.0 (L) 6.5 - 8.1 g/dL   Albumin  2.6 (L) 3.5 - 5.0 g/dL   AST 29 15 - 41 U/L   ALT 55 (H) 0 - 44 U/L   Alkaline Phosphatase 34 (L) 38 - 126 U/L   Total Bilirubin 1.0 0.0 - 1.2 mg/dL   GFR, Estimated 58 (L) >60 mL/min   Anion gap 8 5 - 15  Magnesium   Collection Time: 09/14/23  6:06 AM  Result Value Ref Range   Magnesium 1.9 1.7 - 2.4 mg/dL  Phosphorus   Collection Time: 09/14/23  6:06 AM  Result Value Ref Range   Phosphorus 2.5 2.5 - 4.6 mg/dL  CBC   Collection Time: 09/14/23  6:06 AM  Result Value Ref Range   WBC 10.4 4.0 - 10.5 K/uL   RBC 3.18 (L) 4.22 - 5.81 MIL/uL   Hemoglobin 10.6 (L) 13.0 - 17.0 g/dL   HCT 67.3 (L) 60.9 - 47.9 %   MCV 102.5 (H) 80.0 - 100.0 fL   MCH 33.3 26.0 - 34.0 pg   MCHC 32.5 30.0 - 36.0 g/dL   RDW 84.6 88.4 - 84.4 %   Platelets 191 150 - 400 K/uL   nRBC 0.0 0.0 - 0.2 %  Glucose, capillary   Collection Time: 09/14/23  7:40 AM  Result Value Ref Range   Glucose-Capillary 91 70 - 99 mg/dL   Comment 1 Notify RN    ECHOCARDIOGRAM COMPLETE   Collection Time: 09/14/23  9:27 AM  Result Value Ref Range   Weight 3,442.7 oz   Height 65 in   BP 121/59 mmHg   Ao pk vel 1.44 m/s   AV Peak grad 8.3 mmHg   S' Lateral 2.00 cm   Area-P 1/2 1.92 cm2   Est EF 70 - 75%   Expectorated Sputum Assessment w Gram Stain, Rflx to Resp Cult   Collection Time: 09/15/23  2:20 AM   Specimen: Sputum  Result Value Ref Range   Specimen Description SPUTUM    Special Requests Normal    Sputum evaluation      Sputum specimen not acceptable for testing.  Please recollect.   informed ELIZABETH ORLEANS, LPN @0335  09/15/2023 COP  Performed at [facility], [address] [place], [place] [address]    Report Status 09/15/2023 FINAL   CBC   Collection Time: 09/15/23  5:27 AM  Result Value Ref Range   WBC 10.2 4.0 - 10.5 K/uL   RBC 3.40 (L) 4.22 - 5.81 MIL/uL   Hemoglobin 11.5 (L) 13.0 - 17.0 g/dL   HCT 65.0 (L) 60.9 - 47.9 %   MCV 102.6 (H) 80.0 - 100.0 fL   MCH 33.8 26.0 - 34.0 pg   MCHC 33.0 30.0 - 36.0 g/dL   RDW 84.7 88.4 - 84.4 %   Platelets 201 150 - 400 K/uL   nRBC 0.0 0.0 - 0.2 %  Vitamin B12   Collection Time: 09/15/23  5:27 AM  Result Value Ref Range   Vitamin B-12 3,027 (H) 180 - 914 pg/mL  Basic metabolic panel   Collection Time: 09/15/23  5:27 AM  Result Value Ref Range   Sodium 138 135 - 145 mmol/L   Potassium 3.8 3.5 - 5.1 mmol/L   Chloride 104 98 - 111 mmol/L   CO2 27 22 - 32 mmol/L   Glucose, Bld 89 70 - 99 mg/dL   BUN 26 (H) 8 - 23 mg/dL   Creatinine, Ser 8.77 0.61 - 1.24 mg/dL   Calcium  8.5 (L) 8.9 - 10.3 mg/dL   GFR, Estimated 60 (L) >60 mL/min   Anion gap 7 5 - 15  CBC   Collection Time: 09/16/23  5:29 AM  Result Value Ref Range   WBC 10.6 (H) 4.0 - 10.5 K/uL   RBC 3.64 (L) 4.22 - 5.81 MIL/uL   Hemoglobin 12.2 (L) 13.0 - 17.0 g/dL   HCT 63.0 (L) 60.9 - 47.9 %   MCV 101.4 (H) 80.0 - 100.0 fL   MCH 33.5 26.0 - 34.0 pg   MCHC 33.1 30.0 - 36.0 g/dL   RDW 85.3 88.4 - 84.4 %    Platelets 230 150 - 400 K/uL   nRBC 0.0 0.0 - 0.2 %      Assessment & Plan:   Problem List Items Addressed This Visit     Hyperlipidemia, unspecified   On crestor  - compliance unclear pt does not manage meds and is not here with anyone from home that helps with managing meds - cannot say compliance or SE since he doesn't know what he is taking Will obtain lipid panel      Relevant Orders   Comprehensive metabolic panel with GFR (Completed)   Lipid panel (Completed)   Essential hypertension   BP/heart meds supposed to be restarted when D/C from hospital Unclear actually what he is taking or has restarted supposed to resume losartan  and coreg , BP elevated today Will obtain labs, no med changes for now, pt will need to return with someone from home that knows meds, bring pill bottles, home BP readings would also be helpful to assess HTN control and adjust meds if needed BP Readings from Last 3 Encounters:  09/25/23 (!) 142/78  09/16/23 (!) 150/71  01/03/23 125/84         Relevant Orders   Comprehensive metabolic panel with GFR (Completed)   Hypothyroidism, unspecified   Recheck TSH and adjust levothyroxine  dose per results      Relevant Orders   TSH (Completed)   Atherosclerosis of native arteries of extremity with intermittent claudication (HCC)   On statin      Paroxysmal SVT (supraventricular tachycardia) (HCC)   Est with cardiology HR WNL today On  coreg       Other Visit Diagnoses       Encounter for medical examination to establish care    -  Primary   limited hx given by pt who is here alone today and is not able to answer all questions, reviewed chart and recent records extensively     Encounter for examination following treatment at hospital       reviewed all recent hospital results, test, labs, records and discharge summary   Relevant Orders   CBC with Differential/Platelet (Completed)   Comprehensive metabolic panel with GFR (Completed)   Lipid  panel (Completed)   TSH (Completed)     AKI (acute kidney injury) (HCC)       recheck labs to trend renal function   Relevant Orders   Comprehensive metabolic panel with GFR (Completed)     Heart failure, unspecified HF chronicity, unspecified heart failure type (HCC)       per cardiology      [facility] additional H&P captured today: Assessment & Plan Heart failure with hypotension Recent hospitalization for heart failure with hypotension. Blood pressure now slightly elevated at 140/70s, preferable to hypotension. Medications include losartan  and carvedilol . - Monitor blood pressure regularly. - Continue losartan  and carvedilol . - need f/up with BP readings here or Follow up with cardiology.  Coronary artery disease with cardiac arrhythmia Coronary artery disease with cardiac arrhythmia. Previously on Toprol , now on alternative medications due to insurance. Small blockage and extra heartbeat present. -currently asx - Follow up with cardiology.  Abdominal wall hematoma Abdominal wall hematoma post-fall. Bruising and pain present, expected to reabsorb in 6-7 weeks. No infection or inflammation. - Consider antihistamine like zyrtec, claritin, allergra, zyxal for sneezing if needed, avoid benadryl  Gout Gout affecting feet and legs, one episode in neck. Currently on allopurinol , no recent flares. - Continue allopurinol .  Hypothyroidism Hypothyroidism with unclear status due to lack of recent labs. - Order thyroid  function tests.  Recording duration: 21 minutes    Records req from Dr. Trudy from [facility] primary care office in [place]  Return for 1-2 m f/up with Dr. Bernardo pt wants to see MD only . Recommended to come with all meds in hand and family member who lives with pt or at least manages medications and can help answer questions   Michelene Cower, PA-C 09/25/23 2:54 PM

## 2023-09-30 DIAGNOSIS — N189 Chronic kidney disease, unspecified: Secondary | ICD-10-CM | POA: Diagnosis not present

## 2023-09-30 DIAGNOSIS — I471 Supraventricular tachycardia, unspecified: Secondary | ICD-10-CM | POA: Diagnosis not present

## 2023-09-30 DIAGNOSIS — I5032 Chronic diastolic (congestive) heart failure: Secondary | ICD-10-CM | POA: Diagnosis not present

## 2023-09-30 DIAGNOSIS — I051 Rheumatic mitral insufficiency: Secondary | ICD-10-CM | POA: Diagnosis not present

## 2023-09-30 DIAGNOSIS — N179 Acute kidney failure, unspecified: Secondary | ICD-10-CM | POA: Diagnosis not present

## 2023-09-30 DIAGNOSIS — I2510 Atherosclerotic heart disease of native coronary artery without angina pectoris: Secondary | ICD-10-CM | POA: Diagnosis not present

## 2023-09-30 DIAGNOSIS — E039 Hypothyroidism, unspecified: Secondary | ICD-10-CM | POA: Diagnosis not present

## 2023-09-30 DIAGNOSIS — I130 Hypertensive heart and chronic kidney disease with heart failure and stage 1 through stage 4 chronic kidney disease, or unspecified chronic kidney disease: Secondary | ICD-10-CM | POA: Diagnosis not present

## 2023-09-30 DIAGNOSIS — S301XXD Contusion of abdominal wall, subsequent encounter: Secondary | ICD-10-CM | POA: Diagnosis not present

## 2023-09-30 DIAGNOSIS — J449 Chronic obstructive pulmonary disease, unspecified: Secondary | ICD-10-CM | POA: Diagnosis not present

## 2023-09-30 DIAGNOSIS — G959 Disease of spinal cord, unspecified: Secondary | ICD-10-CM | POA: Diagnosis not present

## 2023-09-30 DIAGNOSIS — I70219 Atherosclerosis of native arteries of extremities with intermittent claudication, unspecified extremity: Secondary | ICD-10-CM | POA: Diagnosis not present

## 2023-09-30 DIAGNOSIS — E785 Hyperlipidemia, unspecified: Secondary | ICD-10-CM | POA: Diagnosis not present

## 2023-10-02 ENCOUNTER — Ambulatory Visit: Payer: Self-pay | Admitting: Family Medicine

## 2023-10-02 DIAGNOSIS — E039 Hypothyroidism, unspecified: Secondary | ICD-10-CM

## 2023-10-02 MED ORDER — LEVOTHYROXINE SODIUM 125 MCG PO TABS: 125.0000 ug | ORAL_TABLET | Freq: Every day | ORAL | 1 refills | Status: DC

## 2023-10-02 NOTE — Assessment & Plan Note (Signed)
 Est with cardiology HR WNL today On coreg 

## 2023-10-02 NOTE — Assessment & Plan Note (Signed)
 On statin.

## 2023-10-02 NOTE — Assessment & Plan Note (Signed)
 On crestor  - compliance unclear pt does not manage meds and is not here with anyone from home that helps with managing meds - cannot say compliance or SE since he doesn't know what he is taking Will obtain lipid panel

## 2023-10-02 NOTE — Assessment & Plan Note (Addendum)
 BP/heart meds supposed to be restarted when D/C from hospital Unclear actually what he is taking or has restarted supposed to resume losartan  and coreg , BP elevated today Will obtain labs, no med changes for now, pt will need to return with someone from home that knows meds, bring pill bottles, home BP readings would also be helpful to assess HTN control and adjust meds if needed BP Readings from Last 3 Encounters:  09/25/23 (!) 142/78  09/16/23 (!) 150/71  01/03/23 125/84

## 2023-10-02 NOTE — Assessment & Plan Note (Signed)
 Recheck TSH and adjust levothyroxine  dose per results

## 2023-10-07 DIAGNOSIS — J449 Chronic obstructive pulmonary disease, unspecified: Secondary | ICD-10-CM | POA: Diagnosis not present

## 2023-10-07 DIAGNOSIS — I70219 Atherosclerosis of native arteries of extremities with intermittent claudication, unspecified extremity: Secondary | ICD-10-CM | POA: Diagnosis not present

## 2023-10-07 DIAGNOSIS — I5032 Chronic diastolic (congestive) heart failure: Secondary | ICD-10-CM | POA: Diagnosis not present

## 2023-10-07 DIAGNOSIS — S301XXD Contusion of abdominal wall, subsequent encounter: Secondary | ICD-10-CM | POA: Diagnosis not present

## 2023-10-07 DIAGNOSIS — I051 Rheumatic mitral insufficiency: Secondary | ICD-10-CM | POA: Diagnosis not present

## 2023-10-07 DIAGNOSIS — N189 Chronic kidney disease, unspecified: Secondary | ICD-10-CM | POA: Diagnosis not present

## 2023-10-07 DIAGNOSIS — N179 Acute kidney failure, unspecified: Secondary | ICD-10-CM | POA: Diagnosis not present

## 2023-10-07 DIAGNOSIS — I2510 Atherosclerotic heart disease of native coronary artery without angina pectoris: Secondary | ICD-10-CM | POA: Diagnosis not present

## 2023-10-07 DIAGNOSIS — G959 Disease of spinal cord, unspecified: Secondary | ICD-10-CM | POA: Diagnosis not present

## 2023-10-07 DIAGNOSIS — E785 Hyperlipidemia, unspecified: Secondary | ICD-10-CM | POA: Diagnosis not present

## 2023-10-07 DIAGNOSIS — E039 Hypothyroidism, unspecified: Secondary | ICD-10-CM | POA: Diagnosis not present

## 2023-10-07 DIAGNOSIS — I130 Hypertensive heart and chronic kidney disease with heart failure and stage 1 through stage 4 chronic kidney disease, or unspecified chronic kidney disease: Secondary | ICD-10-CM | POA: Diagnosis not present

## 2023-10-07 DIAGNOSIS — I471 Supraventricular tachycardia, unspecified: Secondary | ICD-10-CM | POA: Diagnosis not present

## 2023-10-16 ENCOUNTER — Ambulatory Visit (INDEPENDENT_AMBULATORY_CARE_PROVIDER_SITE_OTHER): Admitting: Podiatry

## 2023-10-16 ENCOUNTER — Encounter: Payer: Self-pay | Admitting: Podiatry

## 2023-10-16 DIAGNOSIS — M79672 Pain in left foot: Secondary | ICD-10-CM

## 2023-10-16 DIAGNOSIS — B351 Tinea unguium: Secondary | ICD-10-CM | POA: Diagnosis not present

## 2023-10-16 DIAGNOSIS — M79671 Pain in right foot: Secondary | ICD-10-CM

## 2023-10-16 NOTE — Progress Notes (Signed)
 Patient presents for evaluation and treatment of tenderness and some redness around nails feet.  Tenderness around toes with walking and wearing shoes.  Physical exam:  General appearance: Alert, pleasant, and in no acute distress.  Vascular: Pedal pulses: DP 2/4 B/L, PT 1/4 B/L. Moderate edema lower legs bilaterally  Neu  Dermatologic:  Nails thickened, disfigured, discolored 1-5 BL with subungual debris.  Redness and hypertrophic nail folds along nail folds bilaterally but no signs of drainage or infection.  Musculoskeletal:     Diagnosis: 1. Painful onychomycotic nails 1 through 5 bilaterally. 2. Pain toes 1 through 5 bilaterally.  Plan: -Debrided onychomycotic nails 1 through 5 bilaterally.  Sharply debrided nails with nail clipper and reduced with a power bur.  Return 3 months RFC

## 2023-10-17 DIAGNOSIS — N189 Chronic kidney disease, unspecified: Secondary | ICD-10-CM | POA: Diagnosis not present

## 2023-10-17 DIAGNOSIS — S301XXD Contusion of abdominal wall, subsequent encounter: Secondary | ICD-10-CM | POA: Diagnosis not present

## 2023-10-17 DIAGNOSIS — I130 Hypertensive heart and chronic kidney disease with heart failure and stage 1 through stage 4 chronic kidney disease, or unspecified chronic kidney disease: Secondary | ICD-10-CM | POA: Diagnosis not present

## 2023-10-17 DIAGNOSIS — E785 Hyperlipidemia, unspecified: Secondary | ICD-10-CM | POA: Diagnosis not present

## 2023-10-17 DIAGNOSIS — I70219 Atherosclerosis of native arteries of extremities with intermittent claudication, unspecified extremity: Secondary | ICD-10-CM | POA: Diagnosis not present

## 2023-10-17 DIAGNOSIS — E039 Hypothyroidism, unspecified: Secondary | ICD-10-CM | POA: Diagnosis not present

## 2023-10-17 DIAGNOSIS — I471 Supraventricular tachycardia, unspecified: Secondary | ICD-10-CM | POA: Diagnosis not present

## 2023-10-17 DIAGNOSIS — I051 Rheumatic mitral insufficiency: Secondary | ICD-10-CM | POA: Diagnosis not present

## 2023-10-17 DIAGNOSIS — I5032 Chronic diastolic (congestive) heart failure: Secondary | ICD-10-CM | POA: Diagnosis not present

## 2023-10-17 DIAGNOSIS — J449 Chronic obstructive pulmonary disease, unspecified: Secondary | ICD-10-CM | POA: Diagnosis not present

## 2023-10-17 DIAGNOSIS — I2510 Atherosclerotic heart disease of native coronary artery without angina pectoris: Secondary | ICD-10-CM | POA: Diagnosis not present

## 2023-10-17 DIAGNOSIS — N179 Acute kidney failure, unspecified: Secondary | ICD-10-CM | POA: Diagnosis not present

## 2023-10-17 DIAGNOSIS — G959 Disease of spinal cord, unspecified: Secondary | ICD-10-CM | POA: Diagnosis not present

## 2023-10-28 DIAGNOSIS — I70219 Atherosclerosis of native arteries of extremities with intermittent claudication, unspecified extremity: Secondary | ICD-10-CM | POA: Diagnosis not present

## 2023-10-28 DIAGNOSIS — J449 Chronic obstructive pulmonary disease, unspecified: Secondary | ICD-10-CM | POA: Diagnosis not present

## 2023-10-28 DIAGNOSIS — I051 Rheumatic mitral insufficiency: Secondary | ICD-10-CM | POA: Diagnosis not present

## 2023-10-28 DIAGNOSIS — G959 Disease of spinal cord, unspecified: Secondary | ICD-10-CM | POA: Diagnosis not present

## 2023-10-28 DIAGNOSIS — S301XXD Contusion of abdominal wall, subsequent encounter: Secondary | ICD-10-CM | POA: Diagnosis not present

## 2023-10-28 DIAGNOSIS — I5032 Chronic diastolic (congestive) heart failure: Secondary | ICD-10-CM | POA: Diagnosis not present

## 2023-10-28 DIAGNOSIS — I471 Supraventricular tachycardia, unspecified: Secondary | ICD-10-CM | POA: Diagnosis not present

## 2023-10-28 DIAGNOSIS — I130 Hypertensive heart and chronic kidney disease with heart failure and stage 1 through stage 4 chronic kidney disease, or unspecified chronic kidney disease: Secondary | ICD-10-CM | POA: Diagnosis not present

## 2023-10-28 DIAGNOSIS — E039 Hypothyroidism, unspecified: Secondary | ICD-10-CM | POA: Diagnosis not present

## 2023-10-28 DIAGNOSIS — I2510 Atherosclerotic heart disease of native coronary artery without angina pectoris: Secondary | ICD-10-CM | POA: Diagnosis not present

## 2023-10-28 DIAGNOSIS — N189 Chronic kidney disease, unspecified: Secondary | ICD-10-CM | POA: Diagnosis not present

## 2023-10-28 DIAGNOSIS — N179 Acute kidney failure, unspecified: Secondary | ICD-10-CM | POA: Diagnosis not present

## 2023-10-28 DIAGNOSIS — E785 Hyperlipidemia, unspecified: Secondary | ICD-10-CM | POA: Diagnosis not present

## 2023-11-05 DIAGNOSIS — N189 Chronic kidney disease, unspecified: Secondary | ICD-10-CM | POA: Diagnosis not present

## 2023-11-05 DIAGNOSIS — E785 Hyperlipidemia, unspecified: Secondary | ICD-10-CM | POA: Diagnosis not present

## 2023-11-05 DIAGNOSIS — S301XXD Contusion of abdominal wall, subsequent encounter: Secondary | ICD-10-CM | POA: Diagnosis not present

## 2023-11-05 DIAGNOSIS — N179 Acute kidney failure, unspecified: Secondary | ICD-10-CM | POA: Diagnosis not present

## 2023-11-05 DIAGNOSIS — I471 Supraventricular tachycardia, unspecified: Secondary | ICD-10-CM | POA: Diagnosis not present

## 2023-11-05 DIAGNOSIS — G959 Disease of spinal cord, unspecified: Secondary | ICD-10-CM | POA: Diagnosis not present

## 2023-11-05 DIAGNOSIS — J449 Chronic obstructive pulmonary disease, unspecified: Secondary | ICD-10-CM | POA: Diagnosis not present

## 2023-11-05 DIAGNOSIS — I70219 Atherosclerosis of native arteries of extremities with intermittent claudication, unspecified extremity: Secondary | ICD-10-CM | POA: Diagnosis not present

## 2023-11-05 DIAGNOSIS — I130 Hypertensive heart and chronic kidney disease with heart failure and stage 1 through stage 4 chronic kidney disease, or unspecified chronic kidney disease: Secondary | ICD-10-CM | POA: Diagnosis not present

## 2023-11-05 DIAGNOSIS — E039 Hypothyroidism, unspecified: Secondary | ICD-10-CM | POA: Diagnosis not present

## 2023-11-05 DIAGNOSIS — I051 Rheumatic mitral insufficiency: Secondary | ICD-10-CM | POA: Diagnosis not present

## 2023-11-05 DIAGNOSIS — I2510 Atherosclerotic heart disease of native coronary artery without angina pectoris: Secondary | ICD-10-CM | POA: Diagnosis not present

## 2023-11-05 DIAGNOSIS — I5032 Chronic diastolic (congestive) heart failure: Secondary | ICD-10-CM | POA: Diagnosis not present

## 2023-11-12 DIAGNOSIS — Z008 Encounter for other general examination: Secondary | ICD-10-CM | POA: Diagnosis not present

## 2023-11-12 DIAGNOSIS — R7401 Elevation of levels of liver transaminase levels: Secondary | ICD-10-CM | POA: Diagnosis not present

## 2023-11-25 ENCOUNTER — Ambulatory Visit: Admitting: Internal Medicine

## 2023-12-23 ENCOUNTER — Other Ambulatory Visit: Payer: Self-pay | Admitting: Internal Medicine

## 2023-12-23 NOTE — Telephone Encounter (Unsigned)
 Copied from CRM [id number]. Topic: Clinical - Medication Refill >> Dec 23, 2023  1:08 PM Edsel HERO wrote: Medication: rosuvastatin  (CRESTOR ) 40 MG tablet  Has the patient contacted their pharmacy? No  This is the patient's preferred pharmacy:  [facility] [address]), [place] - [address] [place]) [place] [address] Phone: [phone] Fax: [phone]  Is this the correct pharmacy for this prescription? Yes If no, delete pharmacy and type the correct one.   Has the prescription been filled recently? Yes  Is the patient out of the medication? Yes  Has the patient been seen for an appointment in the last year OR does the patient have an upcoming appointment? Yes  Can we respond through MyChart? No

## 2023-12-25 NOTE — Telephone Encounter (Signed)
 Requested medication (s) are due for refill today: yes  Requested medication (s) are on the active medication list: yes  Last refill:  05/28/23  Future visit scheduled: yes  Notes to clinic:  historical medication     Requested Prescriptions  Pending Prescriptions Disp Refills   rosuvastatin  (CRESTOR ) 40 MG tablet      Sig: Take 1 tablet (40 mg total) by mouth daily.     Cardiovascular:  Antilipid - Statins 2 Failed - 12/25/2023 12:05 PM      Failed - Cr in normal range and within 360 days    Creat  Date Value Ref Range Status  09/25/2023 1.34 (H) 0.70 - 1.22 mg/dL Final         Failed - Lipid Panel in normal range within the last 12 months    Cholesterol, Total  Date Value Ref Range Status  06/07/2022 213 (H) 100 - 199 mg/dL Final   Cholesterol  Date Value Ref Range Status  09/25/2023 173 <200 mg/dL Final   LDL Cholesterol (Calc)  Date Value Ref Range Status  09/25/2023 90 mg/dL (calc) Final    Comment:    Reference range: <100 . Desirable range <100 mg/dL for primary prevention;   <70 mg/dL for patients with CHD or diabetic patients  with > or = 2 CHD risk factors. [facility] LDL-C is now calculated using the Martin-Hopkins  calculation, which is a validated novel method providing  better accuracy than the Friedewald equation in the  estimation of LDL-C.  Gladis APPLETHWAITE et al. SANDREA. 7986;689(80): 2061-2068  (http://education.[facility].com/faq/FAQ164)    HDL  Date Value Ref Range Status  09/25/2023 32 (L) > OR = 40 mg/dL Final  95/74/7975 26 (L) >39 mg/dL Final   Triglycerides  Date Value Ref Range Status  09/25/2023 386 (H) <150 mg/dL Final    Comment:    . If a non-fasting specimen was collected, consider repeat triglyceride testing on a fasting specimen if clinically indicated.  Veatrice et al. J. of Clin. Lipidol. 2015;9:129-169. [facility]RA          Passed - Patient is not pregnant      Passed - Valid encounter within last 12 months    Recent Outpatient  Visits           3 months ago Encounter for medical examination to establish care   [facility] Leavy Mole, PA-C

## 2023-12-26 MED ORDER — ROSUVASTATIN CALCIUM 40 MG PO TABS: 40.0000 mg | ORAL_TABLET | Freq: Every day | ORAL | 1 refills | Status: DC

## 2023-12-29 ENCOUNTER — Other Ambulatory Visit: Payer: Self-pay | Admitting: Medical

## 2024-01-13 ENCOUNTER — Encounter: Admitting: Internal Medicine

## 2024-01-13 ENCOUNTER — Other Ambulatory Visit: Payer: Self-pay

## 2024-01-14 NOTE — Progress Notes (Signed)
 This encounter was created in error - please disregard.

## 2024-01-15 ENCOUNTER — Ambulatory Visit: Admitting: Podiatry

## 2024-01-15 ENCOUNTER — Ambulatory Visit: Admitting: Internal Medicine

## 2024-01-15 ENCOUNTER — Other Ambulatory Visit: Payer: Self-pay

## 2024-01-15 ENCOUNTER — Encounter: Payer: Self-pay | Admitting: Internal Medicine

## 2024-01-15 VITALS — BP 130/76 | HR 61 | Temp 97.9°F | Resp 16 | Ht 65.0 in | Wt 207.6 lb

## 2024-01-15 DIAGNOSIS — M1A09X0 Idiopathic chronic gout, multiple sites, without tophus (tophi): Secondary | ICD-10-CM

## 2024-01-15 DIAGNOSIS — I10 Essential (primary) hypertension: Secondary | ICD-10-CM | POA: Diagnosis not present

## 2024-01-15 DIAGNOSIS — N3281 Overactive bladder: Secondary | ICD-10-CM

## 2024-01-15 DIAGNOSIS — I2510 Atherosclerotic heart disease of native coronary artery without angina pectoris: Secondary | ICD-10-CM

## 2024-01-15 DIAGNOSIS — M4692 Unspecified inflammatory spondylopathy, cervical region: Secondary | ICD-10-CM

## 2024-01-15 DIAGNOSIS — E039 Hypothyroidism, unspecified: Secondary | ICD-10-CM | POA: Diagnosis not present

## 2024-01-15 MED ORDER — DULOXETINE HCL 30 MG PO CPEP: 30.0000 mg | ORAL_CAPSULE | Freq: Two times a day (BID) | ORAL | 1 refills | Status: AC

## 2024-01-15 MED ORDER — EZETIMIBE 10 MG PO TABS: 10.0000 mg | ORAL_TABLET | Freq: Every day | ORAL | 3 refills | Status: AC

## 2024-01-15 MED ORDER — LOSARTAN POTASSIUM 50 MG PO TABS: 50.0000 mg | ORAL_TABLET | Freq: Two times a day (BID) | ORAL | 1 refills | Status: AC

## 2024-01-15 MED ORDER — CARVEDILOL 25 MG PO TABS: 25.0000 mg | ORAL_TABLET | Freq: Two times a day (BID) | ORAL | 3 refills | Status: AC

## 2024-01-15 MED ORDER — ROSUVASTATIN CALCIUM 40 MG PO TABS: 40.0000 mg | ORAL_TABLET | Freq: Every day | ORAL | 1 refills | Status: AC

## 2024-01-15 MED ORDER — LEVOTHYROXINE SODIUM 125 MCG PO TABS: 125.0000 ug | ORAL_TABLET | Freq: Every day | ORAL | 1 refills | Status: AC

## 2024-01-15 MED ORDER — AMLODIPINE BESYLATE 2.5 MG PO TABS: 2.5000 mg | ORAL_TABLET | Freq: Every day | ORAL | 1 refills | Status: AC

## 2024-01-15 MED ORDER — GEMTESA 75 MG PO TABS: 1.0000 | ORAL_TABLET | Freq: Every day | ORAL | 1 refills | Status: AC

## 2024-01-15 MED ORDER — ALLOPURINOL 300 MG PO TABS: 300.0000 mg | ORAL_TABLET | Freq: Every day | ORAL | 1 refills | Status: AC

## 2024-01-15 NOTE — Progress Notes (Signed)
 Established Patient Office Visit  Subjective   Patient ID: Bobby Lane, male    DOB: 1940/11/03  Age: 83 y.o. MRN: [id number]  Chief Complaint  Patient presents with   Establish Care    HPI  Patient is here for follow up on chronic medical conditions. This is my first time meeting him.   Discussed the use of AI scribe software for clinical note transcription with the patient, who gave verbal consent to proceed.  History of Present Illness Bobby Lane is an 83 year old male who presents for medication review and management.  He has hypertension treated with losartan  50 mg twice daily after prior 100 mg dosing was reduced to avoid excessive dose, and carvedilol  25 mg twice daily. He stopped amlodipine 2.5 mg one week ago.  He has hyperlipidemia and takes rosuvastatin  and Zetia , though his rosuvastatin  bottle was found empty and he may be out of this medication.  He has hypothyroidism treated long term with Synthroid  125 mcg daily. Diagnosis was based on blood work. He has not had thyroid  surgery.  He takes allopurinol  300 mg daily for gout prevention and has had no gout flares since starting it.  He has bladder spasms and urgency treated with Gemtesa  but still has urgency with a need to void immediately. He sleeps in a reclined chair due to mobility limitations with his handicapped chair.  He takes Naturemate D3 in the morning for bone health.  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 He initially took three pills daily and has tapered to one daily.   Hypertension: -Medications: Losartan  50 mg BID, Coreg  6.25 mg BID -Patient is compliant with above medications and reports no side effects. -Denies any SOB, CP, vision changes, LE edema or symptoms of hypotension  HLD: -Medications: Crestor  40 mg ?, Zetia  10 mg  -Patient is compliant with above medications and reports  no side effects.  -Last lipid panel: Lipid Panel     Component Value Date/Time   CHOL 173 09/25/2023 1537   CHOL 213 (H) 06/07/2022 1556   TRIG 386 (H) 09/25/2023 1537   HDL 32 (L) 09/25/2023 1537   HDL 26 (L) 06/07/2022 1556   CHOLHDL 5.4 (H) 09/25/2023 1537   LDLCALC 90 09/25/2023 1537   LABVLDL 50 (H) 06/07/2022 1556    Hypothyroidism: -Medications: Levothyroxine  125 mcg -Patient is compliant with the above medication (s) at the above dose and reports no medication side effects.  -Denies weight changes, cold./heat intolerance, skin changes, anxiety/palpitations  -Last TSH: 0.82 8/25  Gout:  -Currently taking Allopurinol  300 mg daily, no flares of gout since starting this medication   OAB: -Currently on Gemtesa  75 mg  Vitamin D  and B12 Deficiencies:  -Currently on both supplements  OTC      Review of Systems  All other systems reviewed and are negative.     Objective:     BP 130/76 (Cuff Size: Large)   Pulse 61   Temp 97.9 F (36.6 C) (Oral)   Resp 16   Ht 5' 5 (1.651 m)   SpO2 96%   BMI 34.28 kg/m  BP Readings from Last 3 Encounters:  01/15/24 130/76  01/13/24 (!) 144/86  09/25/23 (!) 142/78   Wt Readings from Last 3 Encounters:  01/15/24 207 lb 9.6 oz (94.2 kg)  09/25/23 206 lb (93.4 kg)  09/16/23 210 lb 15.7 oz (95.7 kg)  Physical Exam Constitutional:      Appearance: Normal appearance.     Comments: Presents in wheelchair  HENT:     Head: Normocephalic and atraumatic.  Eyes:     Conjunctiva/sclera: Conjunctivae normal.  Cardiovascular:     Rate and Rhythm: Normal rate and regular rhythm.  Pulmonary:     Effort: Pulmonary effort is normal.     Breath sounds: Normal breath sounds.  Musculoskeletal:     Right lower leg: No edema.     Left lower leg: No edema.  Skin:    General: Skin is warm and dry.  Neurological:     General: No focal deficit present.     Mental Status: He is alert. Mental status is at baseline.  Psychiatric:         Mood and Affect: Mood normal.        Behavior: Behavior normal.      No results found for any visits on 01/15/24.  Last CBC Lab Results  Component Value Date   WBC 10.4 09/25/2023   HGB 12.6 (L) 09/25/2023   HCT 38.1 (L) 09/25/2023   MCV 105.2 (H) 09/25/2023   MCH 34.8 (H) 09/25/2023   RDW 15.7 (H) 09/25/2023   PLT 228 09/25/2023   Last metabolic panel Lab Results  Component Value Date   GLUCOSE 69 09/25/2023   NA 142 09/25/2023   K 4.3 09/25/2023   CL 104 09/25/2023   CO2 31 09/25/2023   BUN 32 (H) 09/25/2023   CREATININE 1.34 (H) 09/25/2023   EGFR 53 (L) 09/25/2023   CALCIUM  9.1 09/25/2023   PHOS 2.5 09/14/2023   PROT 5.9 (L) 09/25/2023   ALBUMIN  2.6 (L) 09/14/2023   LABGLOB 2.6 06/07/2022   AGRATIO 1.8 06/07/2022   BILITOT 0.7 09/25/2023   ALKPHOS 34 (L) 09/14/2023   AST 30 09/25/2023   ALT 51 (H) 09/25/2023   ANIONGAP 7 09/15/2023   Last lipids Lab Results  Component Value Date   CHOL 173 09/25/2023   HDL 32 (L) 09/25/2023   LDLCALC 90 09/25/2023   TRIG 386 (H) 09/25/2023   CHOLHDL 5.4 (H) 09/25/2023   Last hemoglobin A1c Lab Results  Component Value Date   HGBA1C 5.3 06/07/2022   Last thyroid  functions Lab Results  Component Value Date   TSH 0.82 09/25/2023   FREET4 1.08 06/07/2022   Last vitamin D  No results found for: 25OHVITD2, 25OHVITD3, VD25OH Last vitamin B12 and Folate Lab Results  Component Value Date   VITAMINB12 3,027 (H) 09/15/2023      The ASCVD Risk score (Arnett DK, et al., 2019) failed to calculate for the following reasons:   The 2019 ASCVD risk score is only valid for ages 78 to 21    Assessment & Plan:   Assessment & Plan Hypertension Blood pressure stable without amlodipine, but restarting recommended. - Refilled losartan  50 mg twice daily. - Refilled carvedilol  25 mg twice daily. - Refilled amlodipine 2.5 mg daily.  Hyperlipidemia Managed with rosuvastatin  and ezetimibe . Rosuvastatin  misplaced,  needs refilling for cardiovascular protection. - Refilled rosuvastatin . - Refilled ezetimibe .  Hypothyroidism Well-managed on Synthroid . Recent thyroid  function tests normal. - Continue Synthroid  125 mcg daily.  Chronic gout, multiple sites, without tophus Well-controlled with allopurinol . No recent episodes reported. - Continue allopurinol  300 mg daily.  Overactive bladder Symptoms persist despite Gemteza. Urgency and inability to hold urine reported. - Continue Gemteza. - Advised to limit fluid intake at night to reduce nocturia.  General Health Maintenance Vitamin D3 supplementation  ongoing for bone health. - Continue Vitamin D3 supplementation.  - losartan  (COZAAR ) 50 MG tablet; Take 1 tablet (50 mg total) by mouth 2 (two) times daily.  Dispense: 180 tablet; Refill: 1 - carvedilol  (COREG ) 25 MG tablet; Take 1 tablet (25 mg total) by mouth 2 (two) times daily with a meal.  Dispense: 60 tablet; Refill: 3 - amLODipine (NORVASC) 2.5 MG tablet; Take 1 tablet (2.5 mg total) by mouth daily.  Dispense: 90 tablet; Refill: 1 - rosuvastatin  (CRESTOR ) 40 MG tablet; Take 1 tablet (40 mg total) by mouth daily.  Dispense: 90 tablet; Refill: 1 - ezetimibe  (ZETIA ) 10 MG tablet; Take 1 tablet (10 mg total) by mouth daily.  Dispense: 90 tablet; Refill: 3 - levothyroxine  (SYNTHROID ) 125 MCG tablet; Take 1 tablet (125 mcg total) by mouth daily.  Dispense: 90 tablet; Refill: 1 - allopurinol  (ZYLOPRIM ) 300 MG tablet; Take 1 tablet (300 mg total) by mouth daily.  Dispense: 90 tablet; Refill: 1 - DULoxetine  (CYMBALTA ) 30 MG capsule; Take 1 capsule (30 mg total) by mouth 2 (two) times daily.  Dispense: 180 capsule; Refill: 1 - GEMTESA  75 MG TABS; Take 1 tablet (75 mg total) by mouth daily.  Dispense: 90 tablet; Refill: 1   Return in about 6 months (around 07/15/2024).    Sharyle Fischer, DO

## 2024-01-30 ENCOUNTER — Telehealth: Payer: Self-pay

## 2024-01-30 NOTE — Telephone Encounter (Signed)
 Copied from CRM [id number]. Topic: General - Other >> Jan 30, 2024  8:43 AM Selinda RAMAN wrote: Reason for CRM: Fer from [facility] called in stating she faxed over 2 requests to fill a form out previously one on 12/1 and one on 12/16. I did not see the form or forms uploaded in Epic. She said she was going to re fax it one more time and would like for it to be filled out and faxed back to [phone]. This is a form that is to verify that the patient has a chronic condition. If there are any questions for Fer please call [phone]. Please assist patient further.

## 2024-01-30 NOTE — Telephone Encounter (Signed)
 faxed

## 2024-07-15 ENCOUNTER — Ambulatory Visit: Admitting: Internal Medicine
